# Patient Record
Sex: Male | Born: 1960 | Race: White | Hispanic: No | Marital: Married | Smoking: Never smoker
Health system: Southern US, Community
[De-identification: ages and names within clinical notes are randomized; demographics above are authoritative.]

## PROBLEM LIST (undated history)

## (undated) DIAGNOSIS — J948 Other specified pleural conditions: Secondary | ICD-10-CM

## (undated) DIAGNOSIS — E119 Type 2 diabetes mellitus without complications: Secondary | ICD-10-CM

## (undated) DIAGNOSIS — I480 Paroxysmal atrial fibrillation: Secondary | ICD-10-CM

## (undated) DIAGNOSIS — I5042 Chronic combined systolic (congestive) and diastolic (congestive) heart failure: Secondary | ICD-10-CM

## (undated) DIAGNOSIS — D638 Anemia in other chronic diseases classified elsewhere: Secondary | ICD-10-CM

## (undated) DIAGNOSIS — I4891 Unspecified atrial fibrillation: Secondary | ICD-10-CM

## (undated) DIAGNOSIS — A419 Sepsis, unspecified organism: Secondary | ICD-10-CM

## (undated) DIAGNOSIS — I10 Essential (primary) hypertension: Secondary | ICD-10-CM

## (undated) DIAGNOSIS — N183 Chronic kidney disease, stage 3 unspecified: Secondary | ICD-10-CM

## (undated) HISTORY — DX: Other specified pleural conditions: J94.8

## (undated) HISTORY — DX: Chronic combined systolic (congestive) and diastolic (congestive) heart failure: I50.42

## (undated) HISTORY — DX: Anemia in other chronic diseases classified elsewhere: D63.8

## (undated) HISTORY — DX: Sepsis, unspecified organism: A41.9

## (undated) HISTORY — DX: Paroxysmal atrial fibrillation: I48.0

## (undated) HISTORY — PX: HAND SURGERY: SHX662

## (undated) HISTORY — DX: Chronic kidney disease, stage 3 unspecified: N18.30

---
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
## 2003-09-22 ENCOUNTER — Other Ambulatory Visit: Payer: Self-pay

## 2019-06-10 ENCOUNTER — Other Ambulatory Visit: Payer: Self-pay

## 2019-06-10 ENCOUNTER — Inpatient Hospital Stay
Admission: EM | Admit: 2019-06-10 | Discharge: 2019-06-16 | DRG: 854 | Disposition: A | Discharge status: Home Health | Payer: Self-pay | Attending: Internal Medicine | Admitting: Internal Medicine

## 2019-06-10 ENCOUNTER — Encounter: Payer: Self-pay | Admitting: Emergency Medicine

## 2019-06-10 ENCOUNTER — Emergency Department: Payer: Self-pay

## 2019-06-10 DIAGNOSIS — N179 Acute kidney failure, unspecified: Secondary | ICD-10-CM | POA: Diagnosis present

## 2019-06-10 DIAGNOSIS — I4821 Permanent atrial fibrillation: Secondary | ICD-10-CM | POA: Diagnosis present

## 2019-06-10 DIAGNOSIS — N141 Nephropathy induced by other drugs, medicaments and biological substances: Secondary | ICD-10-CM | POA: Diagnosis present

## 2019-06-10 DIAGNOSIS — D62 Acute posthemorrhagic anemia: Secondary | ICD-10-CM | POA: Diagnosis not present

## 2019-06-10 DIAGNOSIS — E875 Hyperkalemia: Secondary | ICD-10-CM | POA: Diagnosis present

## 2019-06-10 DIAGNOSIS — L03113 Cellulitis of right upper limb: Secondary | ICD-10-CM | POA: Diagnosis present

## 2019-06-10 DIAGNOSIS — I130 Hypertensive heart and chronic kidney disease with heart failure and stage 1 through stage 4 chronic kidney disease, or unspecified chronic kidney disease: Secondary | ICD-10-CM | POA: Diagnosis present

## 2019-06-10 DIAGNOSIS — Z833 Family history of diabetes mellitus: Secondary | ICD-10-CM

## 2019-06-10 DIAGNOSIS — Z87891 Personal history of nicotine dependence: Secondary | ICD-10-CM

## 2019-06-10 DIAGNOSIS — A4102 Sepsis due to Methicillin resistant Staphylococcus aureus: Principal | ICD-10-CM | POA: Diagnosis present

## 2019-06-10 DIAGNOSIS — D631 Anemia in chronic kidney disease: Secondary | ICD-10-CM | POA: Diagnosis present

## 2019-06-10 DIAGNOSIS — R6520 Severe sepsis without septic shock: Secondary | ICD-10-CM | POA: Diagnosis present

## 2019-06-10 DIAGNOSIS — E1122 Type 2 diabetes mellitus with diabetic chronic kidney disease: Secondary | ICD-10-CM | POA: Diagnosis present

## 2019-06-10 DIAGNOSIS — T508X5A Adverse effect of diagnostic agents, initial encounter: Secondary | ICD-10-CM | POA: Diagnosis present

## 2019-06-10 DIAGNOSIS — I5022 Chronic systolic (congestive) heart failure: Secondary | ICD-10-CM | POA: Diagnosis present

## 2019-06-10 DIAGNOSIS — I4811 Longstanding persistent atrial fibrillation: Secondary | ICD-10-CM

## 2019-06-10 DIAGNOSIS — Z23 Encounter for immunization: Secondary | ICD-10-CM

## 2019-06-10 DIAGNOSIS — L02511 Cutaneous abscess of right hand: Secondary | ICD-10-CM | POA: Diagnosis present

## 2019-06-10 DIAGNOSIS — N183 Chronic kidney disease, stage 3 (moderate): Secondary | ICD-10-CM | POA: Diagnosis present

## 2019-06-10 DIAGNOSIS — I272 Pulmonary hypertension, unspecified: Secondary | ICD-10-CM | POA: Diagnosis present

## 2019-06-10 DIAGNOSIS — A419 Sepsis, unspecified organism: Secondary | ICD-10-CM | POA: Diagnosis present

## 2019-06-10 DIAGNOSIS — Z20828 Contact with and (suspected) exposure to other viral communicable diseases: Secondary | ICD-10-CM | POA: Diagnosis present

## 2019-06-10 HISTORY — DX: Type 2 diabetes mellitus without complications: E11.9

## 2019-06-10 LAB — BASIC METABOLIC PANEL
Anion gap: 9 (ref 5–15)
BUN: 60 mg/dL — ABNORMAL HIGH (ref 6–20)
CO2: 22 mmol/L (ref 22–32)
Calcium: 8 mg/dL — ABNORMAL LOW (ref 8.9–10.3)
Chloride: 98 mmol/L (ref 98–111)
Creatinine, Ser: 1.68 mg/dL — ABNORMAL HIGH (ref 0.61–1.24)
GFR calc Af Amer: 52 mL/min — ABNORMAL LOW (ref >60)
GFR calc non Af Amer: 45 mL/min — ABNORMAL LOW (ref >60)
Glucose, Bld: 256 mg/dL — ABNORMAL HIGH (ref 70–99)
Potassium: 5 mmol/L (ref 3.5–5.1)
Sodium: 129 mmol/L — ABNORMAL LOW (ref 135–145)

## 2019-06-10 LAB — CBC WITH DIFFERENTIAL/PLATELET
Abs Immature Granulocytes: 0.65 10*3/uL — ABNORMAL HIGH (ref 0.00–0.07)
Basophils Absolute: 0.1 10*3/uL (ref 0.0–0.1)
Basophils Relative: 0 %
Eosinophils Absolute: 0.3 10*3/uL (ref 0.0–0.5)
Eosinophils Relative: 1 %
HCT: 30.2 % — ABNORMAL LOW (ref 39.0–52.0)
Hemoglobin: 10.1 g/dL — ABNORMAL LOW (ref 13.0–17.0)
Immature Granulocytes: 2 %
Lymphocytes Relative: 2 %
Lymphs Abs: 0.5 10*3/uL — ABNORMAL LOW (ref 0.7–4.0)
MCH: 29.4 pg (ref 26.0–34.0)
MCHC: 33.4 g/dL (ref 30.0–36.0)
MCV: 88 fL (ref 80.0–100.0)
Monocytes Absolute: 2.6 10*3/uL — ABNORMAL HIGH (ref 0.1–1.0)
Monocytes Relative: 9 %
Neutro Abs: 25.1 10*3/uL — ABNORMAL HIGH (ref 1.7–7.7)
Neutrophils Relative %: 86 %
Platelets: 759 10*3/uL — ABNORMAL HIGH (ref 150–400)
RBC: 3.43 MIL/uL — ABNORMAL LOW (ref 4.22–5.81)
RDW: 11.8 % (ref 11.5–15.5)
Smear Review: ADEQUATE
WBC: 29.4 10*3/uL — ABNORMAL HIGH (ref 4.0–10.5)
nRBC: 0 % (ref 0.0–0.2)

## 2019-06-10 LAB — GLUCOSE, CAPILLARY: Glucose-Capillary: 156 mg/dL — ABNORMAL HIGH (ref 70–99)

## 2019-06-10 LAB — LACTIC ACID, PLASMA
Lactic Acid, Venous: 1 mmol/L (ref 0.5–1.9)
Lactic Acid, Venous: 1.7 mmol/L (ref 0.5–1.9)
Lactic Acid, Venous: 0.9 mmol/L (ref 0.5–1.9)

## 2019-06-10 MED ORDER — VANCOMYCIN HCL IN DEXTROSE 1-5 GM/200ML-% IV SOLN
1000.0000 mg | INTRAVENOUS | Status: DC
  Filled 2019-06-10: qty 200

## 2019-06-10 MED ORDER — SODIUM CHLORIDE 0.9 % IV SOLN
INTRAVENOUS | Status: DC
  Administered 2019-06-10 – 2019-06-11 (×4): via INTRAVENOUS

## 2019-06-10 MED ORDER — SODIUM CHLORIDE 0.9 % IV BOLUS
1000.0000 mL | Freq: Once | INTRAVENOUS | Status: AC
  Administered 2019-06-10: 18:00:00 1000 mL via INTRAVENOUS

## 2019-06-10 MED ORDER — INSULIN ASPART 100 UNIT/ML ~~LOC~~ SOLN
0.0000 [IU] | Freq: Three times a day (TID) | SUBCUTANEOUS | Status: DC
  Administered 2019-06-11 (×2): 1 [IU] via SUBCUTANEOUS
  Administered 2019-06-12: 2 [IU] via SUBCUTANEOUS
  Administered 2019-06-12: 1 [IU] via SUBCUTANEOUS
  Administered 2019-06-13 (×2): 2 [IU] via SUBCUTANEOUS
  Administered 2019-06-13 – 2019-06-14 (×2): 3 [IU] via SUBCUTANEOUS
  Administered 2019-06-14 – 2019-06-16 (×6): 2 [IU] via SUBCUTANEOUS
  Filled 2019-06-10 (×13): qty 1

## 2019-06-10 MED ORDER — INSULIN ASPART 100 UNIT/ML ~~LOC~~ SOLN
0.0000 [IU] | Freq: Every day | SUBCUTANEOUS | Status: DC
  Administered 2019-06-13: 22:00:00 2 [IU] via SUBCUTANEOUS
  Filled 2019-06-10: qty 1

## 2019-06-10 MED ORDER — VANCOMYCIN HCL IN DEXTROSE 1-5 GM/200ML-% IV SOLN
1000.0000 mg | Freq: Once | INTRAVENOUS | Status: DC
  Filled 2019-06-10: qty 200

## 2019-06-10 MED ORDER — SODIUM CHLORIDE 0.9 % IV SOLN
2.0000 g | Freq: Once | INTRAVENOUS | Status: AC
  Administered 2019-06-10: 17:00:00 2 g via INTRAVENOUS
  Filled 2019-06-10: qty 20

## 2019-06-10 MED ORDER — VANCOMYCIN HCL 1.5 G IV SOLR
1500.0000 mg | Freq: Once | INTRAVENOUS | Status: AC
  Administered 2019-06-10: 1500 mg via INTRAVENOUS
  Filled 2019-06-10: qty 1500

## 2019-06-10 MED ORDER — SODIUM CHLORIDE 0.9 % IV SOLN
2.0000 g | Freq: Two times a day (BID) | INTRAVENOUS | Status: DC
  Administered 2019-06-10 – 2019-06-12 (×4): 2 g via INTRAVENOUS
  Filled 2019-06-10 (×5): qty 2

## 2019-06-10 MED ORDER — INFLUENZA VAC SPLIT QUAD 0.5 ML IM SUSY
0.5000 mL | PREFILLED_SYRINGE | INTRAMUSCULAR | Status: AC
  Administered 2019-06-14: 10:00:00 0.5 mL via INTRAMUSCULAR
  Filled 2019-06-10: qty 0.5

## 2019-06-10 NOTE — Consult Note (Signed)
ORTHOPAEDIC CONSULTATION  PATIENT NAME: Nathan [name] DOB: [birth year]  MRN: [id number]  REQUESTING PHYSICIAN: Saundra Shelling, MD  Chief Complaint: Right thumb swelling pain and purulent drainage  HPI: Nathan [name] is a 58 y.o. male who complains of right thumb swelling and pain as well as purulent drainage that started 8 days ago.  Patient has history of diabetes mellitus that he was diagnosed with few months ago.  His last A1c was 6.5.  Patient however has been off his medication and trying to control his blood sugar with diet.  He denies any insect bite or other poking injury.  Patient works fixing heating and air-conditioning and is not sure if he got injured at work.  He was admitted through the ER at [facility] with tachycardia and fever and WBC of 29,000.  The orthopedic service has been consulted.  History reviewed. No pertinent past medical history. History reviewed. No pertinent surgical history. Social History   Socioeconomic History  . Marital status: Married    Spouse name: Not on file  . Number of children: Not on file  . Years of education: Not on file  . Highest education level: Not on file  Occupational History  . Not on file  Social Needs  . Financial resource strain: Not on file  . Food insecurity    Worry: Not on file    Inability: Not on file  . Transportation needs    Medical: Not on file    Non-medical: Not on file  Tobacco Use  . Smoking status: Never Smoker  . Smokeless tobacco: Never Used  Substance and Sexual Activity  . Alcohol use: Never    Frequency: Never  . Drug use: Never  . Sexual activity: Not on file  Lifestyle  . Physical activity    Days per week: Not on file    Minutes per session: Not on file  . Stress: Not on file  Relationships  . Social Herbalist on phone: Not on file    Gets together: Not on file    Attends religious service: Not on file    Active member of club or organization: Not  on file    Attends meetings of clubs or organizations: Not on file    Relationship status: Not on file  Other Topics Concern  . Not on file  Social History Narrative  . Not on file   History reviewed. No pertinent family history. No Known Allergies Prior to Admission medications   Medication Sig Start Date End Date Taking? Authorizing Provider  amoxicillin-clavulanate (AUGMENTIN) 875-125 MG tablet Take 1 tablet by mouth 2 (two) times daily with a meal. 06/05/19  Yes [provider]  hydrochlorothiazide (HYDRODIURIL) 12.5 MG tablet Take 12.5 mg by mouth daily. 02/27/19  Yes [provider]  losartan (COZAAR) 25 MG tablet Take 25 mg by mouth daily. 02/27/19  Yes [provider]   Dg Hand Complete Right  Result Date: 06/10/2019 CLINICAL DATA:  Infection in right thumb EXAM: RIGHT HAND - COMPLETE 3+ VIEW COMPARISON:  None. FINDINGS: No fracture or dislocation of the right hand. Joint spaces are well preserved. Soft tissue edema about the right thumb. IMPRESSION: Soft tissue edema about the right thumb. No fracture, dislocation, or other osseous abnormality. Electronically Signed   By: Eddie Candle M.D.   On: 06/10/2019 17:14    Positive ROS: All other systems have been reviewed and were otherwise negative with the exception of those mentioned  in the HPI and as above.  Physical Exam: General: Well developed, well nourished male seen in no acute distress. HEENT: Atraumatic and normocephalic. Sclera are clear. Extraocular motion is intact. Oropharynx is clear with moist mucosa. Neck: Supple, nontender, good range of motion. No JVD or carotid bruits. Lungs: Clear to auscultation bilaterally. Cardiovascular: Regular rate and rhythm with normal S1 and S2. No murmurs. No gallops or rubs. Pedal pulses are palpable bilaterally. Homans test is negative bilaterally. No significant pretibial or ankle edema. Abdomen: Soft, nontender, and nondistended. Bowel sounds are  present. Skin: No lesions in the area of chief complaint Neurologic: Awake, alert, and oriented. Sensory function is grossly intact. Motor strength is felt to be 5 over 5 bilaterally. No clonus or tremor. Good motor coordination. Lymphatic: No axillary or cervical lymphadenopathy  MUSCULOSKELETAL: Right hand shows swelling around right thumb metacarpophalangeal joint extending down to the [facility] joint.  There is a purulent draining sinus around the MCP joint.  There is also redness and swelling extending into the first dorsal webspace.  Brisk capillary refill is present.  He has intact function of radial median and ulnar nerve in sensorimotor distribution in his right hand.  Assessment: Right hand soft tissue abscess  Plan: 58 years old male recently diagnosed diabetic with a right hand swelling with redness and purulent drainage.  I had a detailed discussion with the patient.  Patient will be set up with CT scan of his right hand to see the extent of the abscess.  Patient will be scheduled for possible incision and drainage tomorrow by the on-call team.   Creig Hines, MD

## 2019-06-10 NOTE — Consult Note (Signed)
Pharmacy Antibiotic Note  Nathan [name] is a 58 y.o. male admitted on 06/10/2019 with sepsis.  Pharmacy has been consulted for Cefepime/Vancomycin  dosing.  Patient received Vancomycin 1750 mg and Ceftriaxone in the ED.  Scr 1.68   Plan: 1. Cefepime 2 g Q12H  2. Will order Vancomycin 1000 mg Q24H- next dose due tomorrow @ 1800.  Will check renal function with AM labs.  AUC Goal 400-550 Expected AUC 452.4 Cssmin 10.9  Height: 5\' 9"  (175.3 cm) Weight: 150 lb (68 kg) IBW/kg (Calculated) : 70.7  Temp (24hrs), Avg:98 F (36.7 C), Min:98 F (36.7 C), Max:98 F (36.7 C)  Recent Labs  Lab 06/10/19 1451 06/10/19 1632  WBC 29.4*  --   CREATININE 1.68*  --   LATICACIDVEN  --  1.0    Estimated Creatinine Clearance: 47.2 mL/min (A) (by C-G formula based on SCr of 1.68 mg/dL (H)).    No Known Allergies   Thank you for allowing pharmacy to be a part of this patient's care.  Rowland Lathe 06/10/2019 5:44 PM

## 2019-06-10 NOTE — Progress Notes (Signed)
CODE SEPSIS - PHARMACY COMMUNICATION  **Broad Spectrum Antibiotics should be administered within 1 hour of Sepsis diagnosis**  Time Code Sepsis Called/Page Received: 5:01 pm   Antibiotics Ordered: Ceftriaxone and Vancomycin   Time of 1st antibiotic administration: Ceftriaxone @ 1715  Additional action taken by pharmacy: N/A  If necessary, Name of Provider/Nurse Contacted: N/A    Rowland Lathe ,PharmD Clinical Pharmacist  06/10/2019  5:15 PM

## 2019-06-10 NOTE — ED Provider Notes (Addendum)
[facility] Emergency Department Provider Note  Time seen: 4:53 PM  I have reviewed the triage vital signs and the nursing notes.   HISTORY  Chief Complaint Wound Infection   HPI Nathan [name] is a 58 y.o. male with no past medical history who presents to the emergency department for right hand/thumb pain redness and swelling.  According to the patient for the past 8 or 9 days he has had redness pain and swelling mostly to the back of the right thumb.  Patient went to urgent care 6 days ago and was started on Augmentin.  States 2 days ago began draining pus from the back left thumb he returned to urgent care today and they referred him to the emergency department.  Patient denies any known fever at home.  Denies any shortness of breath.  Patient is diabetic and states his blood sugars have been running high although he states he does not know what numbers that have been.   History reviewed. No pertinent past medical history.  There are no active problems to display for this patient.   History reviewed. No pertinent surgical history.  Prior to Admission medications   Not on File    No Known Allergies  History reviewed. No pertinent family history.  Social History Social History   Tobacco Use  . Smoking status: Never Smoker  . Smokeless tobacco: Never Used  Substance Use Topics  . Alcohol use: Never    Frequency: Never  . Drug use: Never    Review of Systems Constitutional: Negative for fever. Cardiovascular: Negative for chest pain. Respiratory: Negative for shortness of breath. Gastrointestinal: Negative for abdominal pain Musculoskeletal: Right hand swelling, redness and pain Skin: Redness.  Swelling of the right thumb. Neurological: Negative for headache All other ROS negative  ____________________________________________   PHYSICAL EXAM:  VITAL SIGNS: ED Triage Vitals  Enc Vitals Group     BP 06/10/19 1440 (!) 153/82   Pulse Rate 06/10/19 1440 96     Resp 06/10/19 1440 16     Temp 06/10/19 1440 98 F (36.7 C)     Temp src --      SpO2 06/10/19 1440 96 %     Weight 06/10/19 1442 150 lb (68 kg)     Height 06/10/19 1442 5\' 9"  (1.753 m)     Head Circumference --      Peak Flow --      Pain Score 06/10/19 1441 3     Pain Loc --      Pain Edu? --      Excl. in [place]? --     Constitutional: Alert and oriented. Well appearing and in no distress. Eyes: Normal exam ENT      Head: Normocephalic and atraumatic.      Mouth/Throat: Mucous membranes are moist. Cardiovascular: Normal rate, regular rhythm. N Respiratory: Normal respiratory effort without tachypnea nor retractions. Breath sounds are clear Gastrointestinal: Soft and nontender. No distention.  Musculoskeletal: Nontender with normal range of motion in all extremities.  Neurologic:  Normal speech and language. No gross focal neurologic deficits are appreciated. Skin:  Skin is warm, dry and intact.  Psychiatric: Mood and affect are normal. Speech and behavior are normal.      RADIOLOGY  X-ray shows soft tissue edema around the right thumb no other abnormality noted.  ____________________________________________   INITIAL IMPRESSION / ASSESSMENT AND PLAN / ED COURSE  Pertinent labs & imaging results that were available during my care of  the patient were reviewed by me and considered in my medical decision making (see chart for details).   Patient presents emergency department for approximately 10 days of right thumb redness swelling and discomfort.  Last 6 days patient has been on antibiotics with continued worsening of apparent infection.  For the last 2 days there is been pus draining out of the dorsal aspect of the right thumb/hand.  Patient appears to have significant infection, we will obtain an x-ray to rule out osteomyelitis or gas-forming organisms.  We will check labs and closely monitor.  Patient's white blood cell count has resulted  extremely elevated at 29,000, given tachycardia and extremely high white blood cell count he meets sepsis criteria.  We will check blood cultures, start the patient on IV vancomycin and ceftriaxone.  Patient will be admitted to the hospitalist service for further work-up and treatment and possible orthopedic consultation.  Nathan [name] was evaluated in Emergency Department on 06/10/2019 for the symptoms described in the history of present illness. He was evaluated in the context of the global COVID-19 pandemic, which necessitated consideration that the patient might be at risk for infection with the SARS-CoV-2 virus that causes COVID-19. Institutional protocols and algorithms that pertain to the evaluation of patients at risk for COVID-19 are in a state of rapid change based on information released by regulatory bodies including the CDC and federal and state organizations. These policies and algorithms were followed during the patient's care in the ED.  ____________________________________________   FINAL CLINICAL IMPRESSION(S) / ED DIAGNOSES  Cellulitis   Harvest Dark, MD 06/10/19 1719    Harvest Dark, MD 06/10/19 1719

## 2019-06-10 NOTE — ED Triage Notes (Signed)
Infection in right thumb since last Saturday.  Failed oral antibiotic therapy. Sent in by urgent care

## 2019-06-10 NOTE — H&P (Signed)
[place] [facility] [place] NAME: Nathan [name]    MR#:  [id number]  DATE OF BIRTH:  Feb 04, 1961  DATE OF ADMISSION:  06/10/2019  PRIMARY CARE PHYSICIAN: Patient, No Pcp Per   REQUESTING/REFERRING PHYSICIAN: Harvest Dark  CHIEF COMPLAINT:   Chief Complaint  Patient presents with  . Wound Infection    HISTORY OF PRESENT ILLNESS:  Nathan [name]  is a 58 y.o. male with a known history of diabetes mellitus and hypertension who presented to the emergency room with infected right stump.  This initially started about 8 days ago as a blister with redness and swelling involving the right stump.  Patient was seen at the urgent care about 6 days ago and started on Augmentin.  Within the last 2 days patient started having more swelling and redness and purulent drainage.  Patient denies any trauma.  Does not recall any insect bite.  Patient was sent to the emergency room from the urgent care for further evaluation.  Patient found to have significant leukocytosis with white count of 29,000 and was tachycardic on arrival.  X-ray of the right thumb done revealed soft tissue edema about the right thumb. No fracture, dislocation, or other osseous abnormality.  Patient diagnosed with sepsis secondary to right stump infection.  Medical service called to admit patient for further evaluation and management.   PAST MEDICAL HISTORY:  Hypertension Diabetes mellitus  PAST SURGICAL HISTORY:  History reviewed. No pertinent surgical history.  SOCIAL HISTORY:   Social History   Tobacco Use  . Smoking status: Never Smoker  . Smokeless tobacco: Never Used  Substance Use Topics  . Alcohol use: Never    Frequency: Never    FAMILY HISTORY:  Family history positive for diabetes mellitus but no coronary artery disease  DRUG ALLERGIES:  No Known Allergies  REVIEW OF SYSTEMS:   Review of Systems  Constitutional: Positive for fever. Negative for chills.   HENT: Negative for hearing loss and tinnitus.   Eyes: Negative for blurred vision.  Respiratory: Negative for cough and shortness of breath.   Cardiovascular: Negative for chest pain and palpitations.  Gastrointestinal: Negative for heartburn, nausea and vomiting.  Genitourinary: Negative for dysuria and frequency.  Musculoskeletal: Positive for myalgias.       Right thumb swelling and redness with purulent discharge  Skin: Negative for itching and rash.  Neurological: Negative for dizziness and headaches.  Psychiatric/Behavioral: Negative for depression and hallucinations.    MEDICATIONS AT HOME:   Prior to Admission medications   Medication Sig Start Date End Date Taking? Authorizing Provider  amoxicillin-clavulanate (AUGMENTIN) 875-125 MG tablet Take 1 tablet by mouth 2 (two) times daily with a meal. 06/05/19  Yes [provider]  hydrochlorothiazide (HYDRODIURIL) 12.5 MG tablet Take 12.5 mg by mouth daily. 02/27/19  Yes [provider]  losartan (COZAAR) 25 MG tablet Take 25 mg by mouth daily. 02/27/19  Yes [provider]      VITAL SIGNS:  Blood pressure (!) 153/82, pulse 96, temperature 98 F (36.7 C), resp. rate 16, height 5\' 9"  (1.753 m), weight 68 kg, SpO2 96 %.  PHYSICAL EXAMINATION:  Physical Exam  GENERAL:  58 y.o.-year-old patient lying in the bed with no acute distress.  EYES: Pupils equal, round, reactive to light and accommodation. No scleral icterus. Extraocular muscles intact.  HEENT: Head atraumatic, normocephalic. Oropharynx and nasopharynx clear.  NECK:  Supple, no jugular venous distention. No thyroid enlargement, no tenderness.  LUNGS: Normal  breath sounds bilaterally, no wheezing, rales,rhonchi or crepitation. No use of accessory muscles of respiration.  CARDIOVASCULAR: S1, S2 normal. No murmurs, rubs, or gallops.  ABDOMEN: Soft, nontender, nondistended. Bowel sounds present. No organomegaly or mass.  EXTREMITIES: Right thumb  swelling and redness with an area of slight open wound with purulent discharge on the dorsal aspect see picture below..  No edema.Marland Kitchen  NEUROLOGIC: Cranial nerves II through XII are intact. Muscle strength 5/5 in all extremities. Sensation intact. Gait not checked.  PSYCHIATRIC: The patient is alert and oriented x 3.  SKIN: Redness and swelling with purulent discharge from the right temple.         LABORATORY PANEL:   CBC Recent Labs  Lab 06/10/19 1451  WBC 29.4*  HGB 10.1*  HCT 30.2*  PLT 759*   ------------------------------------------------------------------------------------------------------------------  Chemistries  Recent Labs  Lab 06/10/19 1451  NA 129*  K 5.0  CL 98  CO2 22  GLUCOSE 256*  BUN 60*  CREATININE 1.68*  CALCIUM 8.0*   ------------------------------------------------------------------------------------------------------------------  Cardiac Enzymes No results for input(s): TROPONINI in the last 168 hours. ------------------------------------------------------------------------------------------------------------------  RADIOLOGY:  Dg Hand Complete Right  Result Date: 06/10/2019 CLINICAL DATA:  Infection in right thumb EXAM: RIGHT HAND - COMPLETE 3+ VIEW COMPARISON:  None. FINDINGS: No fracture or dislocation of the right hand. Joint spaces are well preserved. Soft tissue edema about the right thumb. IMPRESSION: Soft tissue edema about the right thumb. No fracture, dislocation, or other osseous abnormality. Electronically Signed   By: Eddie Candle M.D.   On: 06/10/2019 17:14      IMPRESSION AND PLAN:   Patient is a 58 year old male with history of hypertension and diabetes mellitus admitted for management of sepsis secondary to right thumb infection.  1.  Sepsis secondary to right thumb infection Patient started on broad-spectrum IV antibiotics with IV vancomycin and cefepime with pharmacy to dose. IV fluid hydration. Patient having purulent  discharge from the right thumb.  Requested for orthopedic consult for evaluation for possible I&D. X-rays done with no evidence of fracture or dislocation or osseous abnormality. Kept n.p.o. for now while awaiting evaluation from surgeon to determine if any plans for any surgical intervention today.  2.  Hypertension Resume home meds after medication reconciliation is done.  3.  Diabetes mellitus type 2 Placed on sliding scale insulin coverage.  Glycosylated hemoglobin level in a.m.  DVT prophylaxis; SCDs for now Will initiate Lovenox if no plans for any surgical intervention.   All the records are reviewed and case discussed with ED provider. Management plans discussed with the patient, family and they are in agreement.  CODE STATUS: Full code  TOTAL TIME TAKING CARE OF THIS PATIENT: 59 minutes.    Alixandrea Milleson M.D on 06/10/2019 at 6:10 PM  Between 7am to 6pm - Pager - [phone]  After 6pm go to [email or web address] - Proofreader  [facility]  Office  [phone]  CC: Primary care physician; Patient, No Pcp Per   Note: This dictation was prepared with Dragon dictation along with smaller phrase technology. Any transcriptional errors that result from this process are unintentional.

## 2019-06-10 NOTE — Progress Notes (Deleted)
CODE SEPSIS - PHARMACY COMMUNICATION  **Broad Spectrum Antibiotics should be administered within 1 hour of Sepsis diagnosis**  Time Code Sepsis Called/Page Received: 1700  Antibiotics Ordered: Rocephin   Time of 1st antibiotic administration: [id number]  Additional action taken by pharmacy: N/A  If necessary, Name of Provider/Nurse Contacted: N/A    Eston Mould ,PharmD Clinical Pharmacist  06/10/2019  5:38 PM

## 2019-06-10 NOTE — ED Notes (Signed)
ED TO INPATIENT HANDOFF REPORT  ED Nurse Name and Phone #: [phone]  S Name/Age/Gender Nathan [name] 59 y.o. male Room/Bed: ED19A/ED19A  Code Status   Code Status: Full Code  Home/SNF/Other Home Patient oriented to: self, place, time and situation Is this baseline? Yes   Triage Complete: Triage complete  Chief Complaint Wound Infection  Triage Note Infection in right thumb since last Saturday.  Failed oral antibiotic therapy. Sent in by urgent care   Allergies No Known Allergies  Level of Care/Admitting Diagnosis ED Disposition    ED Disposition Condition [place]: [place] [[id number]]  Level of Care: Med-Surg [16]  Covid Evaluation: Asymptomatic Screening Protocol (No Symptoms)  [facility]: [id number]  Admitting Physician: Otila Back [place]  Attending Physician: Otila Back [[id number]]  Estimated length of stay: past midnight tomorrow  Certification:: I certify this patient will need inpatient services for at least 2 midnights  PT Class (Do Not Modify): Inpatient [101]  PT Acc Code (Do Not Modify): Private [1]       B Medical/Surgery History History reviewed. No pertinent past medical history. History reviewed. No pertinent surgical history.   A IV Location/Drains/Wounds Patient Lines/Drains/Airways Status   Active Line/Drains/Airways    Name:   Placement date:   Placement time:   Site:   Days:   Peripheral IV 06/10/19 Left Antecubital   06/10/19    1638    Antecubital   less than 1          Intake/Output Last 24 hours No intake or output data in the 24 hours ending 06/10/19 1858  Labs/Imaging Results for orders placed or performed during the hospital encounter of 06/10/19 (from the past 48 hour(s))  CBC with Differential     Status: Abnormal   Collection Time: 06/10/19  2:51 PM  Result Value Ref Range   WBC 29.4 (H) 4.0 - 10.5 K/uL   RBC 3.43 (L) 4.22 - 5.81 MIL/uL   Hemoglobin 10.1 (L)  13.0 - 17.0 g/dL   HCT 30.2 (L) 39.0 - 52.0 %   MCV 88.0 80.0 - 100.0 fL   MCH 29.4 26.0 - 34.0 pg   MCHC 33.4 30.0 - 36.0 g/dL   RDW 11.8 11.5 - 15.5 %   Platelets 759 (H) 150 - 400 K/uL   nRBC 0.0 0.0 - 0.2 %   Neutrophils Relative % 86 %   Neutro Abs 25.1 (H) 1.7 - 7.7 K/uL   Lymphocytes Relative 2 %   Lymphs Abs 0.5 (L) 0.7 - 4.0 K/uL   Monocytes Relative 9 %   Monocytes Absolute 2.6 (H) 0.1 - 1.0 K/uL   Eosinophils Relative 1 %   Eosinophils Absolute 0.3 0.0 - 0.5 K/uL   Basophils Relative 0 %   Basophils Absolute 0.1 0.0 - 0.1 K/uL   WBC Morphology MORPHOLOGY UNREMARKABLE    RBC Morphology MORPHOLOGY UNREMARKABLE    Smear Review PLATELETS APPEAR ADEQUATE    Immature Granulocytes 2 %   Abs Immature Granulocytes 0.65 (H) 0.00 - 0.07 K/uL    Comment: Performed at [facility], [address] [place], [place] [id number]  Basic metabolic panel     Status: Abnormal   Collection Time: 06/10/19  2:51 PM  Result Value Ref Range   Sodium 129 (L) 135 - 145 mmol/L   Potassium 5.0 3.5 - 5.1 mmol/L   Chloride 98 98 - 111 mmol/L   CO2 22 22 - 32 mmol/L  Glucose, Bld 256 (H) 70 - 99 mg/dL   BUN 60 (H) 6 - 20 mg/dL   Creatinine, Ser 1.68 (H) 0.61 - 1.24 mg/dL   Calcium 8.0 (L) 8.9 - 10.3 mg/dL   GFR calc non Af Amer 45 (L) >60 mL/min   GFR calc Af Amer 52 (L) >60 mL/min   Anion gap 9 5 - 15    Comment: Performed at [facility], [address] [place], [place] [address]  Lactic acid, plasma     Status: None   Collection Time: 06/10/19  4:32 PM  Result Value Ref Range   Lactic Acid, Venous 1.0 0.5 - 1.9 mmol/L    Comment: Performed at [facility], [address] [place], [place] [address]   Dg Hand Complete Right  Result Date: 06/10/2019 CLINICAL DATA:  Infection in right thumb EXAM: RIGHT HAND - COMPLETE 3+ VIEW COMPARISON:  None. FINDINGS: No fracture or dislocation of the right hand. Joint spaces are well preserved. Soft tissue edema about the  right thumb. IMPRESSION: Soft tissue edema about the right thumb. No fracture, dislocation, or other osseous abnormality. Electronically Signed   By: Eddie Candle M.D.   On: 06/10/2019 17:14    Pending Labs Unresulted Labs (From admission, onward)    Start     Ordered   06/11/19 0500  Protime-INR  Tomorrow morning,   STAT     06/10/19 1736   06/11/19 0500  Cortisol-am, blood  Tomorrow morning,   STAT     06/10/19 1736   06/11/19 0500  Procalcitonin  Tomorrow morning,   STAT     06/10/19 1736   06/11/19 [id number]  Basic metabolic panel  Tomorrow morning,   STAT     06/10/19 1737   06/11/19 0500  CBC  Tomorrow morning,   STAT     06/10/19 1737   06/11/19 0500  Magnesium  Tomorrow morning,   STAT     06/10/19 1737   06/11/19 0500  Phosphorus  Tomorrow morning,   STAT     06/10/19 1737   06/11/19 0500  Hemoglobin A1c  Tomorrow morning,   STAT     06/10/19 1737   06/10/19 1734  HIV antibody (Routine Testing)  Once,   STAT     06/10/19 1736   06/10/19 1700  SARS CORONAVIRUS 2 (TAT 6-24 HRS) Nasopharyngeal Nasopharyngeal Swab  (Asymptomatic/Tier 2 Patients Labs)  Once,   STAT    Question Answer Comment  Is this test for diagnosis or screening Screening   Symptomatic for COVID-19 as defined by CDC No   Hospitalized for COVID-19 No   Admitted to ICU for COVID-19 No   Previously tested for COVID-19 No   Resident in a congregate (group) care setting No   Employed in healthcare setting No      06/10/19 1700   06/10/19 1640  Blood culture (routine x 2)  BLOOD CULTURE X 2,   STAT     06/10/19 1639   06/10/19 1640  Lactic acid, plasma  Now then every 2 hours,   STAT     06/10/19 1639          Vitals/Pain Today's Vitals   06/10/19 1441 06/10/19 1442 06/10/19 1755 06/10/19 1801  BP:   (!) 153/102 (!) 156/77  Pulse:   92 91  Resp:      Temp:      SpO2:   96% 97%  Weight:  68 kg    Height:  5\' 9"  (1.753 m)    PainSc: 3        Isolation Precautions No active  isolations  Medications Medications  0.9 %  sodium chloride infusion (has no administration in time range)  insulin aspart (novoLOG) injection 0-9 Units (has no administration in time range)  insulin aspart (novoLOG) injection 0-5 Units (has no administration in time range)  vancomycin (VANCOCIN) 1,500 mg in sodium chloride 0.9 % 500 mL IVPB (1,500 mg Intravenous New Bag/Given 06/10/19 1804)  vancomycin (VANCOCIN) IVPB 1000 mg/200 mL premix (has no administration in time range)  ceFEPIme (MAXIPIME) 2 g in sodium chloride 0.9 % 100 mL IVPB (has no administration in time range)  cefTRIAXone (ROCEPHIN) 2 g in sodium chloride 0.9 % 100 mL IVPB (0 g Intravenous Stopped 06/10/19 1757)  sodium chloride 0.9 % bolus 1,000 mL (1,000 mLs Intravenous New Bag/Given 06/10/19 1801)    Mobility walks Low fall risk   Focused Assessments integumentary   R Recommendations: See Admitting Provider Note  Report given to:   Additional Notes:

## 2019-06-11 ENCOUNTER — Encounter
Admission: EM | Disposition: A | Discharge status: Home Health | Payer: Self-pay | Source: Home / Self Care | Attending: Internal Medicine

## 2019-06-11 ENCOUNTER — Inpatient Hospital Stay: Payer: Self-pay | Admitting: Anesthesiology

## 2019-06-11 ENCOUNTER — Inpatient Hospital Stay: Payer: Self-pay

## 2019-06-11 HISTORY — PX: I & D EXTREMITY: SHX5045

## 2019-06-11 LAB — BLOOD CULTURE ID PANEL (REFLEXED)

## 2019-06-11 LAB — BASIC METABOLIC PANEL WITH GFR
Anion gap: 7 (ref 5–15)
BUN: 51 mg/dL — ABNORMAL HIGH (ref 6–20)
CO2: 19 mmol/L — ABNORMAL LOW (ref 22–32)
Calcium: 7.5 mg/dL — ABNORMAL LOW (ref 8.9–10.3)
Chloride: 108 mmol/L (ref 98–111)
Creatinine, Ser: 1.46 mg/dL — ABNORMAL HIGH (ref 0.61–1.24)
GFR calc Af Amer: >60 mL/min
GFR calc non Af Amer: 53 mL/min — ABNORMAL LOW
Glucose, Bld: 131 mg/dL — ABNORMAL HIGH (ref 70–99)
Potassium: 5.2 mmol/L — ABNORMAL HIGH (ref 3.5–5.1)
Sodium: 134 mmol/L — ABNORMAL LOW (ref 135–145)

## 2019-06-11 LAB — CBC
HCT: 27.2 % — ABNORMAL LOW (ref 39.0–52.0)
Hemoglobin: 9 g/dL — ABNORMAL LOW (ref 13.0–17.0)
MCH: 29.2 pg (ref 26.0–34.0)
MCHC: 33.1 g/dL (ref 30.0–36.0)
MCV: 88.3 fL (ref 80.0–100.0)
Platelets: 686 10*3/uL — ABNORMAL HIGH (ref 150–400)
RBC: 3.08 MIL/uL — ABNORMAL LOW (ref 4.22–5.81)
RDW: 11.9 % (ref 11.5–15.5)
WBC: 23.3 10*3/uL — ABNORMAL HIGH (ref 4.0–10.5)
nRBC: 0 % (ref 0.0–0.2)

## 2019-06-11 LAB — HEMOGLOBIN A1C
Hgb A1c MFr Bld: 6.8 % — ABNORMAL HIGH (ref 4.8–5.6)
Mean Plasma Glucose: 148.46 mg/dL

## 2019-06-11 LAB — GLUCOSE, CAPILLARY
Glucose-Capillary: 114 mg/dL — ABNORMAL HIGH (ref 70–99)
Glucose-Capillary: 124 mg/dL — ABNORMAL HIGH (ref 70–99)
Glucose-Capillary: 125 mg/dL — ABNORMAL HIGH (ref 70–99)
Glucose-Capillary: 127 mg/dL — ABNORMAL HIGH (ref 70–99)
Glucose-Capillary: 130 mg/dL — ABNORMAL HIGH (ref 70–99)

## 2019-06-11 LAB — SURGICAL PCR SCREEN
MRSA, PCR: POSITIVE — AB
Staphylococcus aureus: POSITIVE — AB

## 2019-06-11 LAB — PROTIME-INR
INR: 1.3 — ABNORMAL HIGH (ref 0.8–1.2)
Prothrombin Time: 16.2 s — ABNORMAL HIGH (ref 11.4–15.2)

## 2019-06-11 LAB — CORTISOL-AM, BLOOD: Cortisol - AM: 24.3 ug/dL — ABNORMAL HIGH (ref 6.7–22.6)

## 2019-06-11 LAB — PROCALCITONIN: Procalcitonin: 0.28 ng/mL

## 2019-06-11 LAB — SARS CORONAVIRUS 2 (TAT 6-24 HRS): SARS Coronavirus 2: NEGATIVE

## 2019-06-11 LAB — PHOSPHORUS: Phosphorus: 3.9 mg/dL (ref 2.5–4.6)

## 2019-06-11 LAB — MAGNESIUM: Magnesium: 2.5 mg/dL — ABNORMAL HIGH (ref 1.7–2.4)

## 2019-06-11 SURGERY — IRRIGATION AND DEBRIDEMENT EXTREMITY
Anesthesia: General | Laterality: Right

## 2019-06-11 MED ORDER — FENTANYL CITRATE (PF) 100 MCG/2ML IJ SOLN
INTRAMUSCULAR | Status: AC
  Filled 2019-06-11: qty 2

## 2019-06-11 MED ORDER — NEOMYCIN-POLYMYXIN B GU 40-200000 IR SOLN
Status: DC | PRN
  Administered 2019-06-11: 2 mL

## 2019-06-11 MED ORDER — SODIUM CHLORIDE (PF) 0.9 % IJ SOLN
INTRAMUSCULAR | Status: AC
  Filled 2019-06-11: qty 20

## 2019-06-11 MED ORDER — MIDAZOLAM HCL 2 MG/2ML IJ SOLN
INTRAMUSCULAR | Status: DC | PRN
  Administered 2019-06-11: 2 mg via INTRAVENOUS

## 2019-06-11 MED ORDER — PROPOFOL 10 MG/ML IV BOLUS
INTRAVENOUS | Status: AC
  Filled 2019-06-11: qty 20

## 2019-06-11 MED ORDER — OXYCODONE HCL 5 MG/5ML PO SOLN: 5.0000 mg | Freq: Once | ORAL | Status: DC | PRN

## 2019-06-11 MED ORDER — CHLORHEXIDINE GLUCONATE CLOTH 2 % EX PADS: 6.0000 | MEDICATED_PAD | Freq: Every day | CUTANEOUS | Status: DC

## 2019-06-11 MED ORDER — PANTOPRAZOLE SODIUM 40 MG IV SOLR: 40.0000 mg | Freq: Two times a day (BID) | INTRAVENOUS | Status: DC

## 2019-06-11 MED ORDER — PANTOPRAZOLE SODIUM 40 MG PO TBEC
40.0000 mg | DELAYED_RELEASE_TABLET | Freq: Two times a day (BID) | ORAL | Status: DC
  Administered 2019-06-12 – 2019-06-16 (×9): 40 mg via ORAL
  Filled 2019-06-11 (×9): qty 1

## 2019-06-11 MED ORDER — IOHEXOL 300 MG/ML  SOLN
100.0000 mL | Freq: Once | INTRAMUSCULAR | Status: AC | PRN
  Administered 2019-06-11: 100 mL via INTRAVENOUS

## 2019-06-11 MED ORDER — PROPOFOL 10 MG/ML IV BOLUS
INTRAVENOUS | Status: DC | PRN
  Administered 2019-06-11: 20 mg via INTRAVENOUS
  Administered 2019-06-11: 40 mg via INTRAVENOUS
  Administered 2019-06-11: 160 mg via INTRAVENOUS

## 2019-06-11 MED ORDER — PHENYLEPHRINE HCL (PRESSORS) 10 MG/ML IV SOLN
INTRAVENOUS | Status: DC | PRN
  Administered 2019-06-11 (×4): 100 ug via INTRAVENOUS
  Administered 2019-06-11: 200 ug via INTRAVENOUS

## 2019-06-11 MED ORDER — LIDOCAINE HCL (CARDIAC) PF 100 MG/5ML IV SOSY
PREFILLED_SYRINGE | INTRAVENOUS | Status: DC | PRN
  Administered 2019-06-11: 80 mg via INTRAVENOUS

## 2019-06-11 MED ORDER — PANTOPRAZOLE SODIUM 40 MG IV SOLR
40.0000 mg | Freq: Two times a day (BID) | INTRAVENOUS | Status: DC
  Administered 2019-06-11: 40 mg via INTRAVENOUS
  Filled 2019-06-11: qty 40

## 2019-06-11 MED ORDER — ONDANSETRON HCL 4 MG/2ML IJ SOLN
INTRAMUSCULAR | Status: DC | PRN
  Administered 2019-06-11: 4 mg via INTRAVENOUS

## 2019-06-11 MED ORDER — VASOPRESSIN 20 UNIT/ML IV SOLN
INTRAVENOUS | Status: DC | PRN
  Administered 2019-06-11 (×3): 1 [IU] via INTRAVENOUS

## 2019-06-11 MED ORDER — VANCOMYCIN HCL 1.25 G IV SOLR
1250.0000 mg | INTRAVENOUS | Status: DC
  Administered 2019-06-11 – 2019-06-12 (×2): 1250 mg via INTRAVENOUS
  Filled 2019-06-11 (×3): qty 1250

## 2019-06-11 MED ORDER — OXYCODONE HCL 5 MG PO TABS: 5.0000 mg | ORAL_TABLET | Freq: Once | ORAL | Status: DC | PRN

## 2019-06-11 MED ORDER — EPHEDRINE SULFATE 50 MG/ML IJ SOLN
INTRAMUSCULAR | Status: DC | PRN
  Administered 2019-06-11: 10 mg via INTRAVENOUS

## 2019-06-11 MED ORDER — SODIUM POLYSTYRENE SULFONATE 15 GM/60ML PO SUSP
15.0000 g | Freq: Once | ORAL | Status: AC
  Administered 2019-06-11: 12:00:00 15 g via ORAL
  Filled 2019-06-11: qty 60

## 2019-06-11 MED ORDER — MIDAZOLAM HCL 2 MG/2ML IJ SOLN
INTRAMUSCULAR | Status: AC
  Filled 2019-06-11: qty 2

## 2019-06-11 MED ORDER — FENTANYL CITRATE (PF) 100 MCG/2ML IJ SOLN: 25.0000 ug | INTRAMUSCULAR | Status: DC | PRN

## 2019-06-11 MED ORDER — MUPIROCIN 2 % EX OINT
1.0000 "application " | TOPICAL_OINTMENT | Freq: Two times a day (BID) | CUTANEOUS | Status: DC
  Administered 2019-06-12 – 2019-06-16 (×9): 1 via NASAL
  Filled 2019-06-11: qty 22

## 2019-06-11 SURGICAL SUPPLY — 32 items
BNDG ELASTIC 3X5.8 VLCR NS LF (GAUZE/BANDAGES/DRESSINGS) ×3 IMPLANT
BNDG ESMARK 4X12 TAN STRL LF (GAUZE/BANDAGES/DRESSINGS) ×3 IMPLANT
CANISTER SUCT 1200ML W/VALVE (MISCELLANEOUS) ×3 IMPLANT
CAST PADDING 3X4FT ST 30246 (SOFTGOODS) ×2
COVER WAND RF STERILE (DRAPES) ×3 IMPLANT
CUFF TOURN SGL QUICK 18X4 (TOURNIQUET CUFF) ×2 IMPLANT
CUFF TOURN SGL QUICK 24 (TOURNIQUET CUFF)
CUFF TRNQT CYL 24X4X16.5-23 (TOURNIQUET CUFF) IMPLANT
DRSG DERMACEA 8X12 NADH (GAUZE/BANDAGES/DRESSINGS) ×3 IMPLANT
DRSG GAUZE FLUFF 36X18 (GAUZE/BANDAGES/DRESSINGS) ×6 IMPLANT
DURAPREP 26ML APPLICATOR (WOUND CARE) ×1 IMPLANT
ELECT CAUTERY BLADE 6.4 (BLADE) ×2 IMPLANT
ELECT REM PT RETURN 9FT ADLT (ELECTROSURGICAL) ×3
ELECTRODE REM PT RTRN 9FT ADLT (ELECTROSURGICAL) ×1 IMPLANT
GAUZE SPONGE 4X4 12PLY STRL (GAUZE/BANDAGES/DRESSINGS) ×3 IMPLANT
GLOVE BIOGEL M STRL SZ7.5 (GLOVE) ×5 IMPLANT
GOWN STRL REUS W/ TWL LRG LVL3 (GOWN DISPOSABLE) ×2 IMPLANT
GOWN STRL REUS W/TWL LRG LVL3 (GOWN DISPOSABLE) ×4
IV CATH ANGIO 14GX1.88 NO SAFE (IV SOLUTION) ×2 IMPLANT
KIT TURNOVER CYSTO (KITS) ×3 IMPLANT
LOOP VESSEL SUPERMAXI WHITE (MISCELLANEOUS) ×2 IMPLANT
NS IRRIG 500ML POUR BTL (IV SOLUTION) ×3 IMPLANT
PACK EXTREMITY ARMC (MISCELLANEOUS) ×3 IMPLANT
PAD CAST CTTN 3X4 STRL (SOFTGOODS) ×1 IMPLANT
PAD PREP 24X41 OB/GYN DISP (PERSONAL CARE ITEMS) ×3 IMPLANT
SLEEVE SCD COMPRESS THIGH MED (MISCELLANEOUS) ×2 IMPLANT
STOCKINETTE 48X4 2 PLY STRL (GAUZE/BANDAGES/DRESSINGS) ×1 IMPLANT
STOCKINETTE BIAS CUT 4 980044 (GAUZE/BANDAGES/DRESSINGS) ×2 IMPLANT
STOCKINETTE STRL 4IN 9604848 (GAUZE/BANDAGES/DRESSINGS) ×3 IMPLANT
SUT ETHILON 4 0 P 3 18 (SUTURE) ×3 IMPLANT
SWAB CULTURE AMIES ANAERIB BLU (MISCELLANEOUS) ×4 IMPLANT
SYR 30ML LL (SYRINGE) ×2 IMPLANT

## 2019-06-11 NOTE — Progress Notes (Signed)
PHARMACY - PHYSICIAN COMMUNICATION CRITICAL VALUE ALERT - BLOOD CULTURE IDENTIFICATION (BCID)  Nathan [name] is an 58 y.o. male who presented to [facility] on [facility]/10/2019 with a chief complaint of wound pain  Assessment:  1/4 bottles(anaerobic) positive for Staph Species, Staph Aureus, mecA(+)  Name of physician (or Provider) Contacted: Dr. Brett Albino  Current antibiotics: Vancomycin, Cefepime  Changes to prescribed antibiotics recommended: 3 Will continue current antibiotic regimen as hospitalist would like to continue empiric treatment. Patient is on recommended antibiotics - No changes needed  No results found for this or any previous visit.  [name] 06/11/2019  7:19 PM

## 2019-06-11 NOTE — Anesthesia Preprocedure Evaluation (Addendum)
Anesthesia Evaluation  Patient identified by MRN, date of birth, ID band Patient awake    Reviewed: Allergy & Precautions, H&P , NPO status , Patient's Chart, lab work & pertinent test results  Airway Mallampati: II  TM Distance: >3 FB Neck ROM: full    Dental  (+) Chipped, Poor Dentition, Loose   Pulmonary neg pulmonary ROS, neg shortness of breath, neg COPD, neg recent URI, Not current smoker,           Cardiovascular (-) angina(-) Past MI and (-) Cardiac Stents + dysrhythmias (reports brief episode of A fib years ago while in hospital that resolved on its own, no medications, does not follow with cardiology) Atrial Fibrillation      Neuro/Psych neg Seizures negative neurological ROS  negative psych ROS   GI/Hepatic negative GI ROS, Neg liver ROS, neg GERD  ,  Endo/Other  negative endocrine ROS  Renal/GU negative Renal ROS     Musculoskeletal   Abdominal   Peds  Hematology negative hematology ROS (+)   Anesthesia Other Findings History reviewed.   Reproductive/Obstetrics negative OB ROS                           Anesthesia Physical Anesthesia Plan  ASA: II  Anesthesia Plan: General LMA   Post-op Pain Management:    Induction:   PONV Risk Score and Plan: Dexamethasone, Ondansetron, Midazolam and Treatment may vary due to age or medical condition  Airway Management Planned:   Additional Equipment:   Intra-op Plan:   Post-operative Plan:   Informed Consent: I have reviewed the patients History and Physical, chart, labs and discussed the procedure including the risks, benefits and alternatives for the proposed anesthesia with the patient or authorized representative who has indicated his/her understanding and acceptance.     Dental Advisory Given  Plan Discussed with: Anesthesiologist and CRNA  Anesthesia Plan Comments:         Anesthesia Quick Evaluation

## 2019-06-11 NOTE — Op Note (Signed)
OPERATIVE NOTE  DATE OF SURGERY:  06/11/2019  PATIENT NAME:  Nathan [name]   DOB: 1961-08-26  MRN: [id number]   PRE-OPERATIVE DIAGNOSIS: Abscess of the right thenar space  POST-OPERATIVE DIAGNOSIS:  Same  PROCEDURE: Incision, irrigation, and debridement of the right thenar abscess  SURGEON:  Marciano Sequin., M.D.   ANESTHESIA: general  ESTIMATED BLOOD LOSS: Minimal  FLUIDS REPLACED: 10 00 mL of crystalloid  TOURNIQUET TIME: 64 minutes  DRAINS: 2 Vessel loops              INDICATIONS FOR SURGERY: [name] is a 58 y.o. year old male who has been seen for complaints of pain, swelling, and draining lesions from the right thumb. After discussion of the risks and benefits of surgical intervention, the patient expressed understanding of the risks benefits and agree with plans for incision, irrigation, and debridement of the right thenar space.   PROCEDURE IN DETAIL: The patient was brought into the operating room and, after adequate general anesthesia was achieved, tourniquet was placed on the patient's upper right arm.  The patient's right hand and arm were cleaned and prepped with Betadine and draped in usual sterile fashion.  A "timeout" was performed as per usual protocol.  The right upper extremity was elevated and the tourniquet was inflated to 250 mmHg.  Loupe magnification was used throughout the procedure.  A longitudinal dorsal incision was made in the first webspace and carried down with blunt dissection into the lateral thenar space.  Copious amounts of purulent material was expressed.  Swabs were obtained for Gram stain, culture, and sensitivity.  Next, a curved incision was made along the volar thenar crease.  Then dissection was carefully continued in line with the skin incision.  The digital nerves to the index finger were identified and carefully retracted.  The flexor tendons to the index finger was also then identified and was reflected radially with the first  lumbrical.  The lateral space was then entered by blunt dissection.  Swabs were obtained for culture and sensitivity.  Next, both the volar and dorsal wounds were irrigated with copious amounts of normal saline with antibiotic solution until clear.  Vascular loops were inserted in the wound beds incisions of volar and dorsal).  The surgical incisions were loosely approximated with interrupted sutures of #4-0 nylon.  Finally, the ulcerations to the dorsum of the thumb were sharply debrided using scissors.  Owens gauze dipped in Betadine were used to dress the incision sites and the wound sites.  A bulky dressing was applied.  Tourniquet was deflated after total tourniquet time of 64 minutes.  The patient tolerated procedure well.  He was transported to the recovery room in stable condition.   James P. Holley Bouche M.D.

## 2019-06-11 NOTE — Transfer of Care (Signed)
Immediate Anesthesia Transfer of Care Note  Patient: Nathan [name]  Procedure(s) Performed: IRRIGATION AND DEBRIDEMENT RIGHT THUMB (Right )  Patient Location: PACU  Anesthesia Type:General  Level of Consciousness: sedated  Airway & Oxygen Therapy: Patient Spontanous Breathing and Patient connected to face mask oxygen  Post-op Assessment: Report given to RN and Post -op Vital signs reviewed and stable  Post vital signs: Reviewed and stable  Last Vitals:  Vitals Value Taken Time  BP 116/65 06/11/19 2348  Temp 36.7 C 06/11/19 2348  Pulse 95 06/11/19 2353  Resp 21 06/11/19 2353  SpO2 98 % 06/11/19 2353  Vitals shown include unvalidated device data.  Last Pain:  Vitals:   06/11/19 2348  TempSrc:   PainSc: (P) 0-No pain         Complications: No apparent anesthesia complications

## 2019-06-11 NOTE — Consult Note (Signed)
Pharmacy Antibiotic Note  Nathan [name] is a 58 y.o. male admitted on 06/10/2019 with sepsis.  Pharmacy has been consulted for Cefepime/Vancomycin  dosing.  Patient received Vancomycin 1500 mg and Ceftriaxone in the ED.  Scr 1.68>1.46   Plan: 1. Cefepime 2 g IV Q12H  2. Improvement in renal function. Will increase dose to keep trough >10. Will order Vancomycin 1250 mg Q24H- next dose due @ 1800.  Will check renal function with AM labs.  AUC Goal 400-550 Expected AUC 515.8 Cssmin 12.1 SCr used: 1.46  Height: 5\' 9"  (175.3 cm) Weight: 150 lb (68 kg) IBW/kg (Calculated) : 70.7  Temp (24hrs), Avg:98.2 F (36.8 C), Min:97.9 F (36.6 C), Max:98.6 F (37 C)  Recent Labs  Lab 06/10/19 1451 06/10/19 1632 06/10/19 2027 06/10/19 2317 06/11/19 0420  WBC 29.4*  --   --   --  23.3*  CREATININE 1.68*  --   --   --  1.46*  LATICACIDVEN  --  1.0 1.7 0.9  --     Estimated Creatinine Clearance: 54.3 mL/min (A) (by C-G formula based on SCr of 1.46 mg/dL (H)).    No Known Allergies  CTX 9/20 x1 Cefepime 9/20 >> Vanc 9/20 >>  BCx x2 NGTD  Thank you for allowing pharmacy to be a part of this patient's care.  Rocky Morel 06/11/2019 10:17 AM

## 2019-06-11 NOTE — Anesthesia Procedure Notes (Signed)
Procedure Name: LMA Insertion Date/Time: 06/11/2019 10:00 PM Performed by: Lendon Colonel, CRNA Pre-anesthesia Checklist: Patient identified, Patient being monitored, Timeout performed, Emergency Drugs available and Suction available Patient Re-evaluated:Patient Re-evaluated prior to induction Oxygen Delivery Method: Circle system utilized Preoxygenation: Pre-oxygenation with 100% oxygen Induction Type: IV induction Ventilation: Mask ventilation without difficulty LMA: LMA inserted LMA Size: 4.0 Tube type: Oral Number of attempts: 1 Placement Confirmation: positive ETCO2 and breath sounds checked- equal and bilateral Tube secured with: Tape Dental Injury: Teeth and Oropharynx as per pre-operative assessment

## 2019-06-11 NOTE — Anesthesia Post-op Follow-up Note (Signed)
Anesthesia QCDR form completed.        

## 2019-06-11 NOTE — Progress Notes (Addendum)
[facility] [place] NAME: Nathan [name]    MR#:  [id number]  DATE OF BIRTH:  Mar 17, 1961  SUBJECTIVE:  CHIEF COMPLAINT:   Chief Complaint  Patient presents with   Wound Infection  Patient seen and evaluated today Has bandage to the right thumb and right hand Pain in the right hand noted No fever  REVIEW OF SYSTEMS:    ROS  CONSTITUTIONAL: No documented fever. No fatigue, weakness. No weight gain, no weight loss.  EYES: No blurry or double vision.  ENT: No tinnitus. No postnasal drip. No redness of the oropharynx.  RESPIRATORY: No cough, no wheeze, no hemoptysis. No dyspnea.  CARDIOVASCULAR: No chest pain. No orthopnea. No palpitations. No syncope.  GASTROINTESTINAL: No nausea, no vomiting or diarrhea. No abdominal pain. No melena or hematochezia.  GENITOURINARY: No dysuria or hematuria.  ENDOCRINE: No polyuria or nocturia. No heat or cold intolerance.  HEMATOLOGY: No anemia. No bruising. No bleeding.  INTEGUMENTARY: No rashes. No lesions.  MUSCULOSKELETAL: No arthritis.  No gout.  Swelling of the right hand NEUROLOGIC: No numbness, tingling, or ataxia. No seizure-type activity.  PSYCHIATRIC: No anxiety. No insomnia. No ADD.   DRUG ALLERGIES:  No Known Allergies  VITALS:  Blood pressure (!) 150/79, pulse (!) 103, temperature 98.6 F (37 C), temperature source Oral, resp. rate 18, height 5\' 9"  (1.753 m), weight 68 kg, SpO2 94 %.  PHYSICAL EXAMINATION:   Physical Exam  GENERAL:  58 y.o.-year-old patient lying in the bed with no acute distress.  EYES: Pupils equal, round, reactive to light and accommodation. No scleral icterus. Extraocular muscles intact.  HEENT: Head atraumatic, normocephalic. Oropharynx and nasopharynx clear.  NECK:  Supple, no jugular venous distention. No thyroid enlargement, no tenderness.  LUNGS: Normal breath sounds bilaterally, no wheezing, rales, rhonchi. No use of accessory muscles of respiration.    CARDIOVASCULAR: S1, S2 normal. No murmurs, rubs, or gallops.  ABDOMEN: Soft, nontender, nondistended. Bowel sounds present. No organomegaly or mass.  EXTREMITIES: No cyanosis, clubbing or edema b/l.   Purulent discharge from the wound in the right thumb posterior surface NEUROLOGIC: Cranial nerves II through XII are intact. No focal Motor or sensory deficits b/l.   PSYCHIATRIC: The patient is alert and oriented x 3.  SKIN: Wound noted in the right thumb area with redness of the skin and discharge     LABORATORY PANEL:   CBC Recent Labs  Lab 06/11/19 0420  WBC 23.3*  HGB 9.0*  HCT 27.2*  PLT 686*   ------------------------------------------------------------------------------------------------------------------ Chemistries  Recent Labs  Lab 06/11/19 0420  NA 134*  K 5.2*  CL 108  CO2 19*  GLUCOSE 131*  BUN 51*  CREATININE 1.46*  CALCIUM 7.5*  MG 2.5*   ------------------------------------------------------------------------------------------------------------------  Cardiac Enzymes No results for input(s): TROPONINI in the last 168 hours. ------------------------------------------------------------------------------------------------------------------  RADIOLOGY:  Ct Hand Right W Contrast  Result Date: 06/11/2019 CLINICAL DATA:  Sepsis. Right hand swelling. EXAM: CT OF THE UPPER RIGHT EXTREMITY WITH CONTRAST TECHNIQUE: Multidetector CT imaging of the upper right extremity was performed according to the standard protocol following intravenous contrast administration. COMPARISON:  Radiographs dated 06/10/2019 CONTRAST:  150mL OMNIPAQUE IOHEXOL 300 MG/ML  SOLN FINDINGS: Muscles and Tendons and soft tissues There is an extensive abscess extending from the dorsal aspect of the base of the thumb at the site of the soft tissue ulcer near the base of the proximal phalanx. The abscess extends into the palm involving the abductor pollicis brevis  and adductor pollicis muscles  extending across the palm extending to the volar surface of the head of the third metacarpal best seen on image 85 of series 6. The abscess is at least 6.3 x 3.4 x 2.3 cm. The mass is deep to the flexor tendons of first, second and third digits at the level of the metacarpals. Bones/Joint/Cartilage No evidence of osteomyelitis or other acute abnormality. IMPRESSION: 1. Extensive abscess in the palm extending from the dorsal aspect of the base of the thumb to the volar surface of the head of the third metacarpal. 2. No evidence of osteomyelitis. Electronically Signed   By: Lorriane Shire M.D.   On: 06/11/2019 10:15   Dg Hand Complete Right  Result Date: 06/10/2019 CLINICAL DATA:  Infection in right thumb EXAM: RIGHT HAND - COMPLETE 3+ VIEW COMPARISON:  None. FINDINGS: No fracture or dislocation of the right hand. Joint spaces are well preserved. Soft tissue edema about the right thumb. IMPRESSION: Soft tissue edema about the right thumb. No fracture, dislocation, or other osseous abnormality. Electronically Signed   By: Eddie Candle M.D.   On: 06/10/2019 17:14     ASSESSMENT AND PLAN:    Patient is a 58 year old male with history of hypertension and diabetes mellitus admitted for management of sepsis secondary to right thumb infection and currently under hospitalist service.  1.  Sepsis secondary to right thumb infection Patient started on broad-spectrum IV antibiotics with IV vancomycin and cefepime with pharmacy to dose. IV fluid hydration. Patient having purulent discharge from the right thumb.  Requested for orthopedic consult for evaluation for possible I&D.  Surgery follow-up X-rays done with no evidence of fracture or dislocation or osseous abnormality. Kept n.p.o. for now while awaiting evaluation from surgeon for surgical intervention  2.  Right thumb and hand abscess Broad-spectrum antibiotics and orthopedic surgery follow-up for drainage CT of the hand revealed abscess  2.   Hypertension Medical management to continue  3.  Diabetes mellitus type 2 Placed on sliding scale insulin coverage.  Glycosylated hemoglobin level in a.m.  4. DVT prophylaxis; SCDs for now Will initiate Lovenox if no plans for any surgical intervention.  5.  Hyperkalemia Oral Kayexalate for now  All the records are reviewed and case discussed with ED provider. Management plans discussed with the patient, family and they are in agreement.  CODE STATUS: Full code All the records are reviewed and case discussed with Care Management/Social Worker. Management plans discussed with the patient, family and they are in agreement.  CODE STATUS: Full code  DVT Prophylaxis: SCDs  TOTAL TIME TAKING CARE OF THIS PATIENT: 36 minutes.   POSSIBLE D/C IN 2 to 3 DAYS, DEPENDING ON CLINICAL CONDITION.  Saundra Shelling M.D on 06/11/2019 at 10:33 AM  Between 7am to 6pm - Pager - [phone]  After 6pm go to [email or web address] - password [email or web address] [facility]  Office  [phone]  CC: Primary care physician; Patient, No Pcp Per  Note: This dictation was prepared with Dragon dictation along with smaller phrase technology. Any transcriptional errors that result from this process are unintentional.

## 2019-06-11 NOTE — Progress Notes (Signed)
Advanced care plan. Purpose of the Encounter: CODE STATUS Parties in Attendance: Patient Patient's Decision Capacity: Good Subjective/Patient's story: Nathan [name]  is a 58 y.o. male with a known history of diabetes mellitus and hypertension who presented to the emergency room with infected right stump.  This initially started about 8 days ago as a blister with redness and swelling involving the right stump.  Patient was seen at the urgent care about 6 days ago and started on Augmentin.  Within the last 2 days patient started having more swelling and redness and purulent drainage.  Patient denies any trauma.  Does not recall any insect bite.  Patient was sent to the emergency room from the urgent care for further evaluation.  Patient found to have significant leukocytosis with white count of 29,000 and was tachycardic on arrival.  X-ray of the right thumb done revealed soft tissue edema about the right thumb. No fracture, dislocation, or other osseous abnormality.  Patient diagnosed with sepsis secondary to right stump infection.  Medical service called to admit patient for further evaluation and management. Objective/Medical story Needs IV antibiotics and drainage by surgery Goals of care determination:  Advance care directives and goals of care discussed Patient wants full resuscitation CODE STATUS: Full code Time spent discussing advanced care planning: 16 minutes

## 2019-06-12 ENCOUNTER — Encounter: Payer: Self-pay | Admitting: Orthopedic Surgery

## 2019-06-12 DIAGNOSIS — N179 Acute kidney failure, unspecified: Secondary | ICD-10-CM

## 2019-06-12 DIAGNOSIS — L02511 Cutaneous abscess of right hand: Secondary | ICD-10-CM

## 2019-06-12 DIAGNOSIS — R7881 Bacteremia: Secondary | ICD-10-CM

## 2019-06-12 DIAGNOSIS — E119 Type 2 diabetes mellitus without complications: Secondary | ICD-10-CM

## 2019-06-12 DIAGNOSIS — B9562 Methicillin resistant Staphylococcus aureus infection as the cause of diseases classified elsewhere: Secondary | ICD-10-CM

## 2019-06-12 LAB — CBC
HCT: 26.6 % — ABNORMAL LOW (ref 39.0–52.0)
Hemoglobin: 8.5 g/dL — ABNORMAL LOW (ref 13.0–17.0)
MCH: 29.2 pg (ref 26.0–34.0)
MCHC: 32 g/dL (ref 30.0–36.0)
MCV: 91.4 fL (ref 80.0–100.0)
Platelets: 702 10*3/uL — ABNORMAL HIGH (ref 150–400)
RBC: 2.91 MIL/uL — ABNORMAL LOW (ref 4.22–5.81)
RDW: 12.4 % (ref 11.5–15.5)
WBC: 25.6 10*3/uL — ABNORMAL HIGH (ref 4.0–10.5)
nRBC: 0 % (ref 0.0–0.2)

## 2019-06-12 LAB — GLUCOSE, CAPILLARY
Glucose-Capillary: 110 mg/dL — ABNORMAL HIGH (ref 70–99)
Glucose-Capillary: 139 mg/dL — ABNORMAL HIGH (ref 70–99)
Glucose-Capillary: 160 mg/dL — ABNORMAL HIGH (ref 70–99)
Glucose-Capillary: 193 mg/dL — ABNORMAL HIGH (ref 70–99)

## 2019-06-12 LAB — BASIC METABOLIC PANEL
Anion gap: 9 (ref 5–15)
BUN: 51 mg/dL — ABNORMAL HIGH (ref 6–20)
CO2: 17 mmol/L — ABNORMAL LOW (ref 22–32)
Calcium: 7.5 mg/dL — ABNORMAL LOW (ref 8.9–10.3)
Chloride: 111 mmol/L (ref 98–111)
Creatinine, Ser: 1.74 mg/dL — ABNORMAL HIGH (ref 0.61–1.24)
GFR calc Af Amer: 50 mL/min — ABNORMAL LOW (ref >60)
GFR calc non Af Amer: 43 mL/min — ABNORMAL LOW (ref >60)
Glucose, Bld: 135 mg/dL — ABNORMAL HIGH (ref 70–99)
Potassium: 5.3 mmol/L — ABNORMAL HIGH (ref 3.5–5.1)
Sodium: 137 mmol/L (ref 135–145)

## 2019-06-12 LAB — HIV ANTIBODY (ROUTINE TESTING W REFLEX): HIV Screen 4th Generation wRfx: NONREACTIVE

## 2019-06-12 MED ORDER — ENOXAPARIN SODIUM 40 MG/0.4ML ~~LOC~~ SOLN
40.0000 mg | SUBCUTANEOUS | Status: DC
  Administered 2019-06-12: 40 mg via SUBCUTANEOUS
  Filled 2019-06-12: qty 0.4

## 2019-06-12 MED ORDER — BISACODYL 5 MG PO TBEC: 5.0000 mg | DELAYED_RELEASE_TABLET | Freq: Every day | ORAL | Status: DC | PRN

## 2019-06-12 MED ORDER — MAGNESIUM HYDROXIDE 400 MG/5ML PO SUSP
30.0000 mL | Freq: Every day | ORAL | Status: DC | PRN
  Filled 2019-06-12: qty 30

## 2019-06-12 MED ORDER — OXYCODONE HCL 5 MG PO TABS: 5.0000 mg | ORAL_TABLET | ORAL | Status: DC | PRN

## 2019-06-12 MED ORDER — HEPARIN SODIUM (PORCINE) 5000 UNIT/ML IJ SOLN: 5000.0000 [IU] | Freq: Three times a day (TID) | INTRAMUSCULAR | Status: DC

## 2019-06-12 MED ORDER — PATIROMER SORBITEX CALCIUM 8.4 G PO PACK
16.8000 g | PACK | Freq: Once | ORAL | Status: AC
  Administered 2019-06-12: 16.8 g via ORAL
  Filled 2019-06-12 (×2): qty 2

## 2019-06-12 MED ORDER — HEPARIN SODIUM (PORCINE) 5000 UNIT/ML IJ SOLN
5000.0000 [IU] | Freq: Three times a day (TID) | INTRAMUSCULAR | Status: DC
  Administered 2019-06-13: 5000 [IU] via SUBCUTANEOUS
  Filled 2019-06-12: qty 1

## 2019-06-12 MED ORDER — SENNA 8.6 MG PO TABS
1.0000 | ORAL_TABLET | Freq: Two times a day (BID) | ORAL | Status: DC
  Administered 2019-06-12 – 2019-06-16 (×9): 8.6 mg via ORAL
  Filled 2019-06-12 (×9): qty 1

## 2019-06-12 MED ORDER — OXYCODONE HCL 5 MG PO TABS: 10.0000 mg | ORAL_TABLET | ORAL | Status: DC | PRN

## 2019-06-12 MED ORDER — MAGNESIUM CITRATE PO SOLN
1.0000 | Freq: Once | ORAL | Status: DC | PRN
  Filled 2019-06-12: qty 296

## 2019-06-12 MED ORDER — SODIUM CHLORIDE 0.9 % IV SOLN
INTRAVENOUS | Status: DC
  Administered 2019-06-12 (×2): via INTRAVENOUS

## 2019-06-12 MED ORDER — HYDROMORPHONE HCL 1 MG/ML IJ SOLN: 0.5000 mg | INTRAMUSCULAR | Status: DC | PRN

## 2019-06-12 MED ORDER — ACETAMINOPHEN 325 MG PO TABS: 325.0000 mg | ORAL_TABLET | Freq: Four times a day (QID) | ORAL | Status: DC | PRN

## 2019-06-12 MED ORDER — PHENOL 1.4 % MT LIQD
1.0000 | OROMUCOSAL | Status: DC | PRN
  Filled 2019-06-12: qty 177

## 2019-06-12 NOTE — Progress Notes (Signed)
[facility] NAME: Nathan [name]    MR#:  [id number]  DATE OF BIRTH:  02/03/1961  SUBJECTIVE:  CHIEF COMPLAINT:   Chief Complaint  Patient presents with  . Wound Infection  Patient seen and evaluated today Has bandage to the right thumb and right hand Pain in the right hand better Status post incision and drainage of the right hand abscess No fever  REVIEW OF SYSTEMS:    ROS  CONSTITUTIONAL: No documented fever. No fatigue, weakness. No weight gain, no weight loss.  EYES: No blurry or double vision.  ENT: No tinnitus. No postnasal drip. No redness of the oropharynx.  RESPIRATORY: No cough, no wheeze, no hemoptysis. No dyspnea.  CARDIOVASCULAR: No chest pain. No orthopnea. No palpitations. No syncope.  GASTROINTESTINAL: No nausea, no vomiting or diarrhea. No abdominal pain. No melena or hematochezia.  GENITOURINARY: No dysuria or hematuria.  ENDOCRINE: No polyuria or nocturia. No heat or cold intolerance.  HEMATOLOGY: No anemia. No bruising. No bleeding.  INTEGUMENTARY: No rashes. No lesions.  MUSCULOSKELETAL: No arthritis.  No gout.  Right hand bandage noted NEUROLOGIC: No numbness, tingling, or ataxia. No seizure-type activity.  PSYCHIATRIC: No anxiety. No insomnia. No ADD.   DRUG ALLERGIES:  No Known Allergies  VITALS:  Blood pressure 121/72, pulse 95, temperature 97.6 F (36.4 C), temperature source Oral, resp. rate 16, height 5\' 9"  (1.753 m), weight 68 kg, SpO2 94 %.  PHYSICAL EXAMINATION:   Physical Exam  GENERAL:  58 y.o.-year-old patient lying in the bed with no acute distress.  EYES: Pupils equal, round, reactive to light and accommodation. No scleral icterus. Extraocular muscles intact.  HEENT: Head atraumatic, normocephalic. Oropharynx and nasopharynx clear.  NECK:  Supple, no jugular venous distention. No thyroid enlargement, no tenderness.  LUNGS: Normal breath sounds bilaterally, no wheezing, rales,  rhonchi. No use of accessory muscles of respiration.  CARDIOVASCULAR: S1, S2 normal. No murmurs, rubs, or gallops.  ABDOMEN: Soft, nontender, nondistended. Bowel sounds present. No organomegaly or mass.  EXTREMITIES: No cyanosis, clubbing or edema b/l.   Right hand bandage noted NEUROLOGIC: Cranial nerves II through XII are intact. No focal Motor or sensory deficits b/l.   PSYCHIATRIC: The patient is alert and oriented x 3.  SKIN: Wound noted in the right thumb area with redness of the skin and discharge     LABORATORY PANEL:   CBC Recent Labs  Lab 06/12/19 0414  WBC 25.6*  HGB 8.5*  HCT 26.6*  PLT 702*   ------------------------------------------------------------------------------------------------------------------ Chemistries  Recent Labs  Lab 06/11/19 0420 06/12/19 0414  NA 134* 137  K 5.2* 5.3*  CL 108 111  CO2 19* 17*  GLUCOSE 131* 135*  BUN 51* 51*  CREATININE 1.46* 1.74*  CALCIUM 7.5* 7.5*  MG 2.5*  --    ------------------------------------------------------------------------------------------------------------------  Cardiac Enzymes No results for input(s): TROPONINI in the last 168 hours. ------------------------------------------------------------------------------------------------------------------  RADIOLOGY:  Ct Hand Right W Contrast  Result Date: 06/11/2019 CLINICAL DATA:  Sepsis. Right hand swelling. EXAM: CT OF THE UPPER RIGHT EXTREMITY WITH CONTRAST TECHNIQUE: Multidetector CT imaging of the upper right extremity was performed according to the standard protocol following intravenous contrast administration. COMPARISON:  Radiographs dated 06/10/2019 CONTRAST:  165mL OMNIPAQUE IOHEXOL 300 MG/ML  SOLN FINDINGS: Muscles and Tendons and soft tissues There is an extensive abscess extending from the dorsal aspect of the base of the thumb at the site of the soft tissue ulcer near the base of the proximal  phalanx. The abscess extends into the palm involving  the abductor pollicis brevis and adductor pollicis muscles extending across the palm extending to the volar surface of the head of the third metacarpal best seen on image 85 of series 6. The abscess is at least 6.3 x 3.4 x 2.3 cm. The mass is deep to the flexor tendons of first, second and third digits at the level of the metacarpals. Bones/Joint/Cartilage No evidence of osteomyelitis or other acute abnormality. IMPRESSION: 1. Extensive abscess in the palm extending from the dorsal aspect of the base of the thumb to the volar surface of the head of the third metacarpal. 2. No evidence of osteomyelitis. Electronically Signed   By: Lorriane Shire M.D.   On: 06/11/2019 10:15   Dg Hand Complete Right  Result Date: 06/10/2019 CLINICAL DATA:  Infection in right thumb EXAM: RIGHT HAND - COMPLETE 3+ VIEW COMPARISON:  None. FINDINGS: No fracture or dislocation of the right hand. Joint spaces are well preserved. Soft tissue edema about the right thumb. IMPRESSION: Soft tissue edema about the right thumb. No fracture, dislocation, or other osseous abnormality. Electronically Signed   By: Eddie Candle M.D.   On: 06/10/2019 17:14     ASSESSMENT AND PLAN:    Patient is a 58 year old male with history of hypertension and diabetes mellitus admitted for management of sepsis secondary to right thumb infection and currently under hospitalist service.  1.  Sepsis secondary to right thumb infection improving Secondary to staph aureus bacteremia Antibiotics narrowed down to IV vancomycin Cefepime discontinued Appreciate ID follow-up IV fluid hydration.   2.  Right thumb and hand abscess Status post incision and drainage by surgery Continue antibiotics CT of the hand revealed abscess  2.  Hypertension Medical management to continue  3.  Diabetes mellitus type 2 Placed on sliding scale insulin coverage.  Glycosylated hemoglobin level in a.m.  4. DVT prophylaxis; SCDs for now Will initiate Lovenox if no  plans for any surgical intervention.  5.  Hyperkalemia Start Veltassa  All the records are reviewed and case discussed with ED provider. Management plans discussed with the patient, family and they are in agreement.  CODE STATUS: Full code All the records are reviewed and case discussed with Care Management/Social Worker. Management plans discussed with the patient, family and they are in agreement.  CODE STATUS: Full code  DVT Prophylaxis: SCDs  TOTAL TIME TAKING CARE OF THIS PATIENT: 35 minutes.   POSSIBLE D/C IN 2 to 3 DAYS, DEPENDING ON CLINICAL CONDITION.  Saundra Shelling M.D on 06/12/2019 at 11:43 AM  Between 7am to 6pm - Pager - [phone]  After 6pm go to [email or web address] - password [email or web address] [facility]  Office  [phone]  CC: Primary care physician; Patient, No Pcp Per  Note: This dictation was prepared with Dragon dictation along with smaller phrase technology. Any transcriptional errors that result from this process are unintentional.

## 2019-06-12 NOTE — Progress Notes (Signed)
Dr Jannifer Franklin notified of positive surgical PCR results

## 2019-06-12 NOTE — Consult Note (Signed)
NAME: Nathan [name]  DOB: [birth year]  MRN: [id number]  Date/Time: 06/12/2019 6:12 PM  REQUESTING PROVIDER:pyreddy Subjective:  REASON FOR CONSULT: MRSA bacteremia ? Nathan [name] is a 58 y.o. male with a history of diabetes mellitus, hypertension presented to the ED on 06/10/2019 with infection of the right thumb since 5 days.  He was seen initially in the urgent care and was prescribed oral antibiotic but he was failing therapy and hence was sent to the hospital. As per patient it started as a blister 8 days ago with redness and swelling involving the right thumb. 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   He was seen in urgent care and was given Augmentin.  But the swelling started to get worse along with purulent drainage and he came to the ED.  Vitals in the ED was blood pressure of 153/82, pulse rate of 96 and temperature of 98.  He was found to have a white count of 29,000 in the ED.  X-ray of the thumb revealed soft tissue edema. Blood cultures were sent and he was started on IV cefepime and vancomycin He had a CT scan of his hand on 06/11/2019 which showed extensive abscess in the palm extending from the dorsal aspect of the base of the thumb to the volar surface of the head of the third metacarpal.   .  Patient was seen by Dr. Marry Guan and underwent IND of the right thenar abscess. I am seeing the patient as the blood culture from 06/10/2019 is growing methicillin-resistant staph aureus.    Past Medical History:  Diagnosis Date  . Diabetes mellitus without complication ([place])    HTN afib once 17 yrs ago  Past Surgical History:  Procedure Laterality Date  . I&D EXTREMITY Right 06/11/2019   Procedure: IRRIGATION AND DEBRIDEMENT RIGHT THUMB;  Surgeon: Dereck Leep, MD;  Location: [facility] ORS;  Service: Orthopedics;  Laterality: Right;    Social History   Socioeconomic History  . Marital status: Married    Spouse name: Not on file  . Number of children: Not  on file  . Years of education: Not on file  . Highest education level: Not on file  Occupational History  . Not on file  Social Needs  . Financial resource strain: Not on file  . Food insecurity    Worry: Not on file    Inability: Not on file  . Transportation needs    Medical: Not on file    Non-medical: Not on file  Tobacco Use  . Smoking status: Never Smoker  . Smokeless tobacco: Never Used  Substance and Sexual Activity  . Alcohol use: Never    Frequency: Never  . Drug use: Never  . Sexual activity: Not on file  Lifestyle  . Physical activity    Days per week: Not on file    Minutes per session: Not on file  . Stress: Not on file  Relationships  . Social Herbalist on phone: Not on file    Gets together: Not on file    Attends religious service: Not on file    Active member of club or organization: Not on file    Attends meetings of clubs or organizations: Not on file    Relationship status: Not on file  . Intimate partner violence    Fear of current or ex partner: Not on file    Emotionally abused: Not on file  Physically abused: Not on file    Forced sexual activity: Not on file  Other Topics Concern  . Not on file  Social History Narrative  . Not on file    No Known Allergies  Family History MI father DM , sister, brother   Current Facility-Administered Medications  Medication Dose Route Frequency Provider Last Rate Last Dose  . 0.9 %  sodium chloride infusion   Intravenous Continuous Pyreddy, Reatha Harps, MD 100 mL/hr at 06/12/19 1714    . acetaminophen (TYLENOL) tablet 325-650 mg  325-650 mg Oral Q6H PRN Hooten, Laurice Record, MD      . bisacodyl (DULCOLAX) EC tablet 5 mg  5 mg Oral Daily PRN Dereck Leep, MD      . Derrill Memo ON 06/13/2019] heparin injection 5,000 Units  5,000 Units Subcutaneous Q8H Hallaji, Sheema M, RPH      . HYDROmorphone (DILAUDID) injection 0.5-1 mg  0.5-1 mg Intravenous Q4H PRN Hooten, Laurice Record, MD      . influenza vac split  quadrivalent PF (FLUARIX) injection 0.5 mL  0.5 mL Intramuscular Tomorrow-1000 Hooten, Laurice Record, MD      . insulin aspart (novoLOG) injection 0-5 Units  0-5 Units Subcutaneous QHS Hooten, James P, MD      . insulin aspart (novoLOG) injection 0-9 Units  0-9 Units Subcutaneous TID WC Hooten, Laurice Record, MD   2 Units at 06/12/19 1730  . magnesium citrate solution 1 Bottle  1 Bottle Oral Once PRN Hooten, Laurice Record, MD      . magnesium hydroxide (MILK OF MAGNESIA) suspension 30 mL  30 mL Oral Daily PRN Hooten, Laurice Record, MD      . mupirocin ointment (BACTROBAN) 2 % 1 application  1 application Nasal BID Hooten, Laurice Record, MD   1 application at [id number]  . oxyCODONE (Oxy IR/ROXICODONE) immediate release tablet 10-15 mg  10-15 mg Oral Q4H PRN Hooten, Laurice Record, MD      . oxyCODONE (Oxy IR/ROXICODONE) immediate release tablet 5-10 mg  5-10 mg Oral Q4H PRN Hooten, Laurice Record, MD      . pantoprazole (PROTONIX) EC tablet 40 mg  40 mg Oral BID Hooten, Laurice Record, MD   40 mg at 06/12/19 1108  . phenol (CHLORASEPTIC) mouth spray 1 spray  1 spray Mouth/Throat PRN Pyreddy, Pavan, MD      . senna (SENOKOT) tablet 8.6 mg  1 tablet Oral BID Hooten, Laurice Record, MD   8.6 mg at 06/12/19 1108  . vancomycin (VANCOCIN) 1,250 mg in sodium chloride 0.9 % 250 mL IVPB  1,250 mg Intravenous Q24H Hooten, Laurice Record, MD 166.7 mL/hr at 06/12/19 1730 1,250 mg at 06/12/19 1730     Abtx:  Anti-infectives (From admission, onward)   Start     Dose/Rate Route Frequency Ordered Stop   06/11/19 1800  vancomycin (VANCOCIN) IVPB 1000 mg/200 mL premix  Status:  Discontinued     1,000 mg 200 mL/hr over 60 Minutes Intravenous Every 24 hours 06/10/19 1750 06/11/19 1016   06/11/19 1800  vancomycin (VANCOCIN) 1,250 mg in sodium chloride 0.9 % 250 mL IVPB     1,250 mg 166.7 mL/hr over 90 Minutes Intravenous Every 24 hours 06/11/19 1016     06/10/19 1800  vancomycin (VANCOCIN) 1,500 mg in sodium chloride 0.9 % 500 mL IVPB     1,500 mg 250 mL/hr over 120  Minutes Intravenous  Once 06/10/19 1739 06/10/19 2013   06/10/19 1800  ceFEPIme (MAXIPIME) 2 g in sodium chloride  0.9 % 100 mL IVPB  Status:  Discontinued     2 g 200 mL/hr over 30 Minutes Intravenous Every 12 hours 06/10/19 1750 06/12/19 0911   06/10/19 1715  cefTRIAXone (ROCEPHIN) 2 g in sodium chloride 0.9 % 100 mL IVPB     2 g 200 mL/hr over 30 Minutes Intravenous  Once 06/10/19 1701 06/10/19 1757   06/10/19 1700  vancomycin (VANCOCIN) IVPB 1000 mg/200 mL premix  Status:  Discontinued     1,000 mg 200 mL/hr over 60 Minutes Intravenous  Once 06/10/19 1652 06/10/19 1739      REVIEW OF SYSTEMS:  Const: negative fever, negative chills, negative weight loss Eyes: negative diplopia or visual changes, negative eye pain ENT: negative coryza, negative sore throat Resp: negative cough, hemoptysis, dyspnea Cards: negative for chest pain, palpitations, lower extremity edema GU: negative for frequency, dysuria and hematuria GI: Negative for abdominal pain, diarrhea, bleeding, constipation Skin: negative for rash and pruritus Heme: negative for easy bruising and gum/nose bleeding MS: rt arm pain and swelling Neurolo:negative for headaches, dizziness, vertigo, memory problems  Psych: negative for feelings of anxiety, depression  Endocrine: negative for thyroid, diabetes Allergy/Immunology- negative for any medication or food allergies ? Pertinent Positives include : Objective:  VITALS:  BP 125/66 (BP Location: Right Arm)   Pulse 94   Temp 99 F (37.2 C) (Oral)   Resp 18   Ht 5\' 9"  (1.753 m)   Wt 68 kg   SpO2 92%   BMI 22.15 kg/m  PHYSICAL EXAM:  General: Alert, cooperative, no distress, appears stated age.  Head: Normocephalic, without obvious abnormality, atraumatic. Eyes: Conjunctivae clear, anicteric sclerae. Pupils are equal ENT Nares normal. No drainage or sinus tenderness. Lips, mucosa, and tongue normal. No Thrush Neck: Supple, symmetrical, no adenopathy, thyroid: non  tender no carotid bruit and no JVD. Back: No CVA tenderness. Lungs: Clear to auscultation bilaterally. No Wheezing or Rhonchi. No rales. Heart: Regular rate and rhythm, no murmur, rub or gallop. Abdomen: Soft, non-tender,not distended. Bowel sounds normal. No masses Extremities:    S/p I/D  Edema ankles  Skin: b/l shin scabs Lymph: Cervical, supraclavicular normal. Neurologic: Grossly non-focal Pertinent Labs Lab Results CBC    Component Value Date/Time   WBC 25.6 (H) 06/12/2019 0414   RBC 2.91 (L) 06/12/2019 0414   HGB 8.5 (L) 06/12/2019 0414   HCT 26.6 (L) 06/12/2019 0414   PLT 702 (H) 06/12/2019 0414   MCV 91.4 06/12/2019 0414   MCH 29.2 06/12/2019 0414   MCHC 32.0 06/12/2019 0414   RDW 12.4 06/12/2019 0414   LYMPHSABS 0.5 (L) 06/10/2019 1451   MONOABS 2.6 (H) 06/10/2019 1451   EOSABS 0.3 06/10/2019 1451   BASOSABS 0.1 06/10/2019 1451    CMP Latest Ref Rng & Units 06/12/2019 06/11/2019 06/10/2019  Glucose 70 - 99 mg/dL 135(H) 131(H) 256(H)  BUN 6 - 20 mg/dL 51(H) 51(H) 60(H)  Creatinine 0.61 - 1.24 mg/dL 1.74(H) 1.46(H) 1.68(H)  Sodium 135 - 145 mmol/L 137 134(L) 129(L)  Potassium 3.5 - 5.1 mmol/L 5.3(H) 5.2(H) 5.0  Chloride 98 - 111 mmol/L 111 108 98  CO2 22 - 32 mmol/L 17(L) 19(L) 22  Calcium 8.9 - 10.3 mg/dL 7.5(L) 7.5(L) 8.0(L)      Microbiology: Recent Results (from the past 240 hour(s))  Blood culture (routine x 2)     Status: None (Preliminary result)   Collection Time: 06/10/19  4:32 PM   Specimen: BLOOD  Result Value Ref Range Status   Specimen Description  BLOOD LEFT ANTECUBITAL  Final   Special Requests   Final    BOTTLES DRAWN AEROBIC AND ANAEROBIC Blood Culture results may not be optimal due to an inadequate volume of blood received in culture bottles   Culture   Final    NO GROWTH 2 DAYS Performed at [facility], [address] [place], [place] [address]    Report Status PENDING  Incomplete  Blood culture (routine x 2)      Status: None (Preliminary result)   Collection Time: 06/10/19  5:12 PM   Specimen: BLOOD  Result Value Ref Range Status   Specimen Description   Final    BLOOD RT FA Performed at [facility], [address], [place], [place] [address]    Special Requests   Final    BOTTLES DRAWN AEROBIC AND ANAEROBIC Blood Culture adequate volume Performed at [facility], [address] [place], [place] [address]    Culture  Setup Time   Final    Organism ID to follow [place] CRITICAL RESULT CALLED TO, READ BACK BY AND VERIFIED WITH: Rito Ehrlich 06/11/2019 1808 KMP Performed at [facility], [address], [place], [place] [address]    Culture   Final    Lonell Grandchild POSITIVE COCCI TOO YOUNG TO READ Performed at [facility], [place] [address], [place], [place] [address]    Report Status PENDING  Incomplete  SARS CORONAVIRUS 2 (TAT 6-24 HRS) Nasopharyngeal Nasopharyngeal Swab     Status: None   Collection Time: 06/10/19  5:12 PM   Specimen: Nasopharyngeal Swab  Result Value Ref Range Status   SARS Coronavirus 2 NEGATIVE NEGATIVE Final    Comment: (NOTE) SARS-CoV-2 target nucleic acids are NOT DETECTED. The SARS-CoV-2 RNA is generally detectable in upper and lower respiratory specimens during the acute phase of infection. Negative results do not preclude SARS-CoV-2 infection, do not rule out co-infections with other pathogens, and should not be used as the sole basis for treatment or other patient management decisions. Negative results must be combined with clinical observations, patient history, and epidemiological information. The expected result is Negative. Fact Sheet for Patients: SugarRoll.be Fact Sheet for Healthcare Providers: https://www.woods-mathews.com/ This test is not yet approved or cleared by the [place] FDA and  has been authorized for detection and/or diagnosis of  SARS-CoV-2 by FDA under an Emergency Use Authorization (EUA). This EUA will remain  in effect (meaning this test can be used) for the duration of the COVID-19 declaration under Section 56 4(b)(1) of the Act, 21 U.S.C. section 360bbb-3(b)(1), unless the authorization is terminated or revoked sooner. Performed at [facility], [place] [address] [place], [place] [address]   Blood Culture ID Panel (Reflexed)     Status: Abnormal   Collection Time: 06/10/19  5:12 PM  Result Value Ref Range Status   Enterococcus species NOT DETECTED NOT DETECTED Final   Listeria monocytogenes NOT DETECTED NOT DETECTED Final   Staphylococcus species DETECTED (A) NOT DETECTED Final    Comment: CRITICAL RESULT CALLED TO, READ BACK BY AND VERIFIED WITH: WALID NAZARI ON 06/11/2019 AT 1608 KMP    Staphylococcus aureus (BCID) DETECTED (A) NOT DETECTED Final    Comment: Methicillin (oxacillin)-resistant Staphylococcus aureus (MRSA). MRSA is predictably resistant to beta-lactam antibiotics (except ceftaroline). Preferred therapy is vancomycin unless clinically contraindicated. Patient requires contact precautions if  hospitalized. CRITICAL RESULT CALLED TO, READ BACK BY AND VERIFIED WITH: WALID NAZARI ON 06/11/2019 AT  [place] resistance DETECTED (A) NOT DETECTED Final    Comment: CRITICAL RESULT CALLED TO, READ BACK BY AND VERIFIED WITH: WALID NAZARI ON 06/11/2019 AT 1608 KMP    Streptococcus species NOT DETECTED NOT DETECTED Final   Streptococcus agalactiae NOT DETECTED NOT DETECTED Final   Streptococcus pneumoniae NOT DETECTED NOT DETECTED Final   Streptococcus pyogenes NOT DETECTED NOT DETECTED Final   Acinetobacter baumannii NOT DETECTED NOT DETECTED Final   Enterobacteriaceae species NOT DETECTED NOT DETECTED Final   Enterobacter cloacae complex NOT DETECTED NOT DETECTED Final   Escherichia coli NOT DETECTED NOT DETECTED Final   Klebsiella oxytoca NOT DETECTED NOT DETECTED Final    Klebsiella pneumoniae NOT DETECTED NOT DETECTED Final   Proteus species NOT DETECTED NOT DETECTED Final   Serratia marcescens NOT DETECTED NOT DETECTED Final   Haemophilus influenzae NOT DETECTED NOT DETECTED Final   Neisseria meningitidis NOT DETECTED NOT DETECTED Final   Pseudomonas aeruginosa NOT DETECTED NOT DETECTED Final   Candida albicans NOT DETECTED NOT DETECTED Final   Candida glabrata NOT DETECTED NOT DETECTED Final   Candida krusei NOT DETECTED NOT DETECTED Final   Candida parapsilosis NOT DETECTED NOT DETECTED Final   Candida tropicalis NOT DETECTED NOT DETECTED Final    Comment: Performed at [facility], [address] [place], [place] [address]  Surgical pcr screen     Status: Abnormal   Collection Time: 06/11/19  8:21 PM   Specimen: Nasal Mucosa; Nasal Swab  Result Value Ref Range Status   MRSA, PCR POSITIVE (A) NEGATIVE Final    Comment: RESULT CALLED TO, READ BACK BY AND VERIFIED WITH: Janeann Merl ON 06/11/2019 AT 2148 QSD    Staphylococcus aureus POSITIVE (A) NEGATIVE Final    Comment: (NOTE) The Xpert SA Assay (FDA approved for NASAL specimens in patients 56 years of age and older), is one component of a comprehensive surveillance program. It is not intended to diagnose infection nor to guide or monitor treatment. Performed at [facility], [address] [place], [place] [address]   Aerobic/Anaerobic Culture (surgical/deep wound)     Status: None (Preliminary result)   Collection Time: 06/11/19 10:30 PM   Specimen: PATH Other; Wound  Result Value Ref Range Status   Specimen Description   Final    HAND RIGHT DORSAL INCISION Performed at [facility], [address], [place], [place] [address]    Special Requests   Final    PATIENT ON FOLLOWING CEFEPIME VANCOMYCIN Performed at [facility], [place] [address], [place], [place] [address]    Gram Stain   Final    NO ORGANISMS SEEN RARE WBC MANY RBC Performed at [facility], [address] [place],  [address]    Culture PENDING  Incomplete   Report Status PENDING  Incomplete  Aerobic/Anaerobic Culture (surgical/deep wound)     Status: None (Preliminary result)   Collection Time: 06/11/19 10:33 PM   Specimen: PATH Other; Wound  Result Value Ref Range Status   Specimen Description   Final    HAND Performed at [facility], [address] [place],  [address]    Special Requests   Final    RIGHT HAND DORSAL INCISION 2 PATIENT ON FOLLOWING CEFEPIME VANCOMYCIN Performed at [facility], [place] [address], [place], [place] [address]    Gram Stain   Final    NO ORGANISMS SEEN MANY RBC FEW WBC Performed at [facility],  [place], [place] [address]    Culture PENDING  Incomplete   Report Status PENDING  Incomplete  Aerobic/Anaerobic Culture (surgical/deep wound)     Status: None (Preliminary result)   Collection Time: 06/11/19 10:53 PM   Specimen: PATH Other; Wound  Result Value Ref Range Status   Specimen Description   Final    HAND Performed at [facility], [address] [place], [place] [address]    Special Requests   Final    RIGHT HAND LOWER INCISION PATIENT ON FOLLOWING CEFEPIME VANCOMYCIN Performed at [facility], [place] [address], [place], [place] [address]    Gram Stain   Final    NO ORGANISMS SEEN FEW WBC MODERATE RBC Performed at [facility], [address] [place],  [address]    Culture PENDING  Incomplete   Report Status PENDING  Incomplete    IMAGING RESULTS: I have personally reviewed the films ? Impression/Recommendation ? 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 _AKI--watch closely as he  is on vancomycin  Anemia- unclear etiology- was low in June 2002. Prior to that was normal. With AKi need to r/o MM or other causes _  DM- says he is no longer on any meds Last Hba1c was 6.3 in June 2020 _______ __________________________________________ Discussed with patient in detail Note:  This document was prepared using Dragon voice recognition software and may include unintentional dictation errors.

## 2019-06-12 NOTE — TOC Initial Note (Signed)
Transition of Care [facility]) - Initial/Assessment Note    Patient Details  Name: Nathan [name] MRN: [id number] Date of Birth: 1961/07/26  [facility] CM/SW Contact:    Candie Chroman, LCSW Phone Number: 06/12/2019, 1:10 PM  Clinical Narrative: CSW met with patient. No supports at bedside. CSW introduced role. Patient confirmed he has no PCP or insurance. Patient did go to [facility] within the past few months to follow up regarding his diabetes. CSW provided free/low-cost healthcare booklet as well as intake paperwork for [facility] in case he decides to follow up there. Patient works and says he can afford to pay for medications although he is not taking any at this time. No further concerns. CSW encouraged patient to contact CSW as needed. CSW will continue to follow patient for support until discharge.                 Expected Discharge Plan: Home/Self Care Barriers to Discharge: Continued Medical Work up   Patient Goals and CMS Choice     Choice offered to / list presented to : NA  Expected Discharge Plan and Services Expected Discharge Plan: Home/Self Care       Living arrangements for the past 2 months: Single Family Home                                      Prior Living Arrangements/Services Living arrangements for the past 2 months: Single Family Home Lives with:: Spouse Patient language and need for interpreter reviewed:: Yes Do you feel safe going back to the place where you live?: Yes      Need for Family Participation in Patient Care: Yes (Comment) Care giver support system in place?: Yes (comment)   Criminal Activity/Legal Involvement Pertinent to Current Situation/Hospitalization: No - Comment as needed  Activities of Daily Living Home Assistive Devices/Equipment: CBG Meter ADL Screening (condition at time of admission) Patient's cognitive ability adequate to safely complete daily activities?: Yes Is the patient deaf or have  difficulty hearing?: No Does the patient have difficulty seeing, even when wearing glasses/contacts?: No Does the patient have difficulty concentrating, remembering, or making decisions?: No Patient able to express need for assistance with ADLs?: Yes Does the patient have difficulty dressing or bathing?: No Independently performs ADLs?: Yes (appropriate for developmental age) Does the patient have difficulty walking or climbing stairs?: Yes Weakness of Legs: Both Weakness of Arms/Hands: None  Permission Sought/Granted                  Emotional Assessment Appearance:: Appears stated age Attitude/Demeanor/Rapport: Engaged, Gracious Affect (typically observed): Accepting, Appropriate, Calm, Pleasant Orientation: : Oriented to Self, Oriented to Place, Oriented to  Time, Oriented to Situation Alcohol / Substance Use: Never Used Psych Involvement: No (comment)  Admission diagnosis:  Cellulitis of right upper extremity [L03.113] Patient Active Problem List   Diagnosis Date Noted  . Sepsis ([place]) 06/10/2019   PCP:  Patient, No Pcp Per Pharmacy:   [facility] [address] ([address]), [place]) [place] [address] Phone: [phone] Fax: [phone]     Social Determinants of Health (SDOH) Interventions    Readmission Risk Interventions No flowsheet data found.

## 2019-06-12 NOTE — Anesthesia Postprocedure Evaluation (Signed)
Anesthesia Post Note  Patient: Nathan [name]  Procedure(s) Performed: IRRIGATION AND DEBRIDEMENT RIGHT THUMB (Right )  Patient location during evaluation: PACU Anesthesia Type: General Level of consciousness: awake and alert Pain management: pain level controlled Vital Signs Assessment: post-procedure vital signs reviewed and stable Respiratory status: spontaneous breathing, nonlabored ventilation and respiratory function stable Cardiovascular status: blood pressure returned to baseline and stable Postop Assessment: no apparent nausea or vomiting Anesthetic complications: no     Last Vitals:  Vitals:   06/12/19 0041 06/12/19 0136  BP: 117/65 122/69  Pulse: 94 95  Resp: 18 20  Temp: 36.7 C 36.6 C  SpO2: 91% 93%    Last Pain:  Vitals:   06/12/19 0136  TempSrc: Axillary  PainSc:                  Durenda Hurt

## 2019-06-12 NOTE — Progress Notes (Signed)
      INFECTIOUS DISEASE ATTENDING ADDENDUM:   Date: 06/12/2019  Patient name: Nathan [name]  Medical record number: [id number]  Date of birth: [birth year]        [facility] Antimicrobial Management Team Staphylococcus aureus bacteremia   Staphylococcus aureus bacteremia (SAB) is associated with a high rate of complications and mortality.  Specific aspects of clinical management are critical to optimizing the outcome of patients with SAB.  Therefore, the [facility] Antimicrobial Management Team [facility]) has initiated an intervention aimed at improving the management of SAB at [facility].  To do so, Infectious Diseases physicians are providing an evidence-based consult for the management of all patients with SAB.     Yes No Comments  Perform follow-up blood cultures (even if the patient is afebrile) to ensure clearance of bacteremia [x]  []    Remove vascular catheter and obtain follow-up blood cultures after the removal of the catheter []  []  DO NOT PLACE CENTRAL LINE  Or if one is in place it will need to be removed and pt have catheter holiday  Perform echocardiography to evaluate for endocarditis (transthoracic ECHO is 40-50% sensitive, TEE is > 90% sensitive) [x]  []  Please keep in mind, that neither test can definitively EXCLUDE endocarditis, and that should clinical suspicion remain high for endocarditis the patient should then still be treated with an "endocarditis" duration of therapy = 6 weeks  Consult electrophysiologist to evaluate implanted cardiac device (pacemaker, ICD) []  []    Ensure source control []  []  Have all abscesses been drained effectively? Have deep seeded infections (septic joints or osteomyelitis) had appropriate surgical debridement?\  ABSCESS I and D by Orthopedics  Investigate for "metastatic" sites of infection [x]  []  Does the patient have ANY symptom or physical exam finding that would suggest a deeper infection (back or neck pain that may be suggestive of  vertebral osteomyelitis or epidural abscess, muscle pain that could be a symptom of pyomyositis)?  Keep in mind that for deep seeded infections MRI imaging with contrast is preferred rather than other often insensitive tests such as plain x-rays, especially early in a patient's presentation.  Change antibiotic therapy to vancomycin and DC cefepime []  []  Beta-lactam antibiotics are preferred for MSSA due to higher cure rates.   If on Vancomycin, goal trough should be 15 - 20 mcg/mL  Estimated duration of IV antibiotic therapy:  4- 6 weeks []  []  Consult case management for probably prolonged outpatient IV antibiotic therapy    Formal consult from Dr. Delaine Lame to follow.   [name] 06/12/2019, 8:52 AM

## 2019-06-12 NOTE — Progress Notes (Signed)
Subjective: 1 Day Post-Op Procedure(s) (LRB): IRRIGATION AND DEBRIDEMENT RIGHT THUMB (Right) Patient reports pain as mild to the right hand, feels like his hand has improved s/p I&D performed yesterday. Patient is well, has no acute complaints. Plan is to go Home after hospital stay. Negative for chest pain and shortness of breath Fever: no Gastrointestinal:Negative for nausea and vomiting  Objective: Vital signs in last 24 hours: Temp:  [97.6 F (36.4 C)-98.6 F (37 C)] 97.6 F (36.4 C) (09/22 0400) Pulse Rate:  [92-101] 95 (09/22 0400) Resp:  [16-21] 16 (09/22 0400) BP: (116-161)/(64-88) 121/72 (09/22 0400) SpO2:  [91 %-99 %] 94 % (09/22 0400)  Intake/Output from previous day:  Intake/Output Summary (Last 24 hours) at 06/12/2019 1212 Last data filed at 06/12/2019 0300 Gross per 24 hour  Intake 2237.51 ml  Output 0 ml  Net 2237.51 ml    Intake/Output this shift: No intake/output data recorded.  Labs: Recent Labs    06/10/19 1451 06/11/19 0420 06/12/19 0414  HGB 10.1* 9.0* 8.5*   Recent Labs    06/11/19 0420 06/12/19 0414  WBC 23.3* 25.6*  RBC 3.08* 2.91*  HCT 27.2* 26.6*  PLT 686* 702*   Recent Labs    06/11/19 0420 06/12/19 0414  NA 134* 137  K 5.2* 5.3*  CL 108 111  CO2 19* 17*  BUN 51* 51*  CREATININE 1.46* 1.74*  GLUCOSE 131* 135*  CALCIUM 7.5* 7.5*   Recent Labs    06/11/19 0420  INR 1.3*     EXAM General - Patient is Alert, Appropriate and Oriented Extremity - Bulky dressing intact to the right hand this morning.  Intact to light touch over the distal aspect of each finger and thumb.  Cap refill intact to each digit.  Able to flex and extend all fingers. Dressing/Incision - Bulky dressing intact without any acute drainage.  Past Medical History:  Diagnosis Date  . Diabetes mellitus without complication (HCC)     Assessment/Plan: 1 Day Post-Op Procedure(s) (LRB): IRRIGATION AND DEBRIDEMENT RIGHT THUMB (Right) Active Problems:  Sepsis ([place]) Anemia due to acute blood loss  Estimated body mass index is 22.15 kg/m as calculated from the following:   Height as of this encounter: 5\' 9"  (1.753 m).   Weight as of this encounter: 68 kg. Advance diet Up with therapy D/C IV fluids when tolerating po intake.  Labs reviewed, WBC 25.6 this morning.  CBC ordered for tomorrow morning. K+ 5.3, internal medicine managing. Hg 8.5 this AM. Cultures obtained during surgery have not returned, no current growth on gram stain. Currently on IV Vancomycin Plan for dressing change tomorrow morning.  DVT Prophylaxis - heparin Non-weightbearing to the right hand.  Raquel Dymon [place], PA-C [facility] 06/12/2019, 12:12 PM

## 2019-06-13 ENCOUNTER — Inpatient Hospital Stay
Admit: 2019-06-13 | Discharge: 2019-06-13 | Disposition: A | Discharge status: Home / Self Care | Payer: Self-pay | Attending: Infectious Diseases | Admitting: Infectious Diseases

## 2019-06-13 DIAGNOSIS — I10 Essential (primary) hypertension: Secondary | ICD-10-CM

## 2019-06-13 DIAGNOSIS — I361 Nonrheumatic tricuspid (valve) insufficiency: Secondary | ICD-10-CM

## 2019-06-13 DIAGNOSIS — I480 Paroxysmal atrial fibrillation: Secondary | ICD-10-CM

## 2019-06-13 LAB — IRON AND TIBC
Iron: 25 ug/dL — ABNORMAL LOW (ref 45–182)
Saturation Ratios: 19 % (ref 17.9–39.5)
TIBC: 129 ug/dL — ABNORMAL LOW (ref 250–450)
UIBC: 104 ug/dL

## 2019-06-13 LAB — BASIC METABOLIC PANEL
Anion gap: 8 (ref 5–15)
BUN: 63 mg/dL — ABNORMAL HIGH (ref 6–20)
CO2: 17 mmol/L — ABNORMAL LOW (ref 22–32)
Calcium: 7.7 mg/dL — ABNORMAL LOW (ref 8.9–10.3)
Chloride: 110 mmol/L (ref 98–111)
Creatinine, Ser: 1.94 mg/dL — ABNORMAL HIGH (ref 0.61–1.24)
GFR calc Af Amer: 44 mL/min — ABNORMAL LOW (ref >60)
GFR calc non Af Amer: 38 mL/min — ABNORMAL LOW (ref >60)
Glucose, Bld: 244 mg/dL — ABNORMAL HIGH (ref 70–99)
Potassium: 4.7 mmol/L (ref 3.5–5.1)
Sodium: 135 mmol/L (ref 135–145)

## 2019-06-13 LAB — GLUCOSE, CAPILLARY
Glucose-Capillary: 214 mg/dL — ABNORMAL HIGH (ref 70–99)
Glucose-Capillary: 180 mg/dL — ABNORMAL HIGH (ref 70–99)
Glucose-Capillary: 178 mg/dL — ABNORMAL HIGH (ref 70–99)
Glucose-Capillary: 215 mg/dL — ABNORMAL HIGH (ref 70–99)

## 2019-06-13 LAB — ECHOCARDIOGRAM COMPLETE
Height: 69 in
Weight: 2400 oz

## 2019-06-13 LAB — LIPID PANEL
Cholesterol: 122 mg/dL (ref 0–200)
HDL: 21 mg/dL — ABNORMAL LOW (ref >40)
LDL Cholesterol: 71 mg/dL (ref 0–99)
Total CHOL/HDL Ratio: 5.8 RATIO
Triglycerides: 152 mg/dL — ABNORMAL HIGH (ref <150)
VLDL: 30 mg/dL (ref 0–40)

## 2019-06-13 LAB — CBC
HCT: 24.3 % — ABNORMAL LOW (ref 39.0–52.0)
Hemoglobin: 7.9 g/dL — ABNORMAL LOW (ref 13.0–17.0)
MCH: 29.7 pg (ref 26.0–34.0)
MCHC: 32.5 g/dL (ref 30.0–36.0)
MCV: 91.4 fL (ref 80.0–100.0)
Platelets: 677 10*3/uL — ABNORMAL HIGH (ref 150–400)
RBC: 2.66 MIL/uL — ABNORMAL LOW (ref 4.22–5.81)
RDW: 12.6 % (ref 11.5–15.5)
WBC: 20 10*3/uL — ABNORMAL HIGH (ref 4.0–10.5)
nRBC: 0 % (ref 0.0–0.2)

## 2019-06-13 LAB — SEDIMENTATION RATE: Sed Rate: 108 mm/hr — ABNORMAL HIGH (ref 0–20)

## 2019-06-13 LAB — FERRITIN: Ferritin: 617 ng/mL — ABNORMAL HIGH (ref 24–336)

## 2019-06-13 LAB — TSH: TSH: 2.826 u[IU]/mL (ref 0.350–4.500)

## 2019-06-13 LAB — FOLATE: Folate: 8.4 ng/mL (ref >5.9)

## 2019-06-13 LAB — VITAMIN B12: Vitamin B-12: 683 pg/mL (ref 180–914)

## 2019-06-13 MED ORDER — HEPARIN SODIUM (PORCINE) 5000 UNIT/ML IJ SOLN
5000.0000 [IU] | Freq: Three times a day (TID) | INTRAMUSCULAR | Status: DC
  Administered 2019-06-13 – 2019-06-16 (×7): 5000 [IU] via SUBCUTANEOUS
  Filled 2019-06-13 (×7): qty 1

## 2019-06-13 MED ORDER — METOPROLOL TARTRATE 25 MG PO TABS
25.0000 mg | ORAL_TABLET | Freq: Four times a day (QID) | ORAL | Status: DC
  Administered 2019-06-13 – 2019-06-16 (×10): 25 mg via ORAL
  Filled 2019-06-13 (×11): qty 1

## 2019-06-13 MED ORDER — APIXABAN 5 MG PO TABS: 5.0000 mg | ORAL_TABLET | Freq: Two times a day (BID) | ORAL | Status: DC

## 2019-06-13 MED ORDER — DILTIAZEM HCL-DEXTROSE 100-5 MG/100ML-% IV SOLN (PREMIX)
5.0000 mg/h | INTRAVENOUS | Status: DC
  Filled 2019-06-13: qty 100

## 2019-06-13 MED ORDER — VANCOMYCIN VARIABLE DOSE PER UNSTABLE RENAL FUNCTION (PHARMACIST DOSING): Status: DC

## 2019-06-13 MED ORDER — DILTIAZEM HCL 25 MG/5ML IV SOLN
20.0000 mg | Freq: Once | INTRAVENOUS | Status: AC
  Administered 2019-06-13: 10:00:00 20 mg via INTRAVENOUS
  Filled 2019-06-13: qty 5

## 2019-06-13 MED ORDER — VANCOMYCIN HCL IN DEXTROSE 1-5 GM/200ML-% IV SOLN
1000.0000 mg | INTRAVENOUS | Status: DC
  Administered 2019-06-13: 18:00:00 1000 mg via INTRAVENOUS
  Filled 2019-06-13: qty 200

## 2019-06-13 MED ORDER — DILTIAZEM HCL ER 60 MG PO CP12
120.0000 mg | ORAL_CAPSULE | Freq: Two times a day (BID) | ORAL | Status: DC
  Administered 2019-06-13: 120 mg via ORAL
  Filled 2019-06-13 (×2): qty 2

## 2019-06-13 NOTE — Progress Notes (Addendum)
[facility] [place] NAME: Trung Ardito    MR#:  [id number]  DATE OF BIRTH:  09/23/1960  SUBJECTIVE:  CHIEF COMPLAINT:   Chief Complaint  Patient presents with  . Wound Infection  Patient seen and evaluated today Patient had palpitations this morning Heart rate around 170 bpm He was given IV Cardizem push 20 mg but heart rate still persists around 160 bpm Patient appears to be in A. Fib Bandage to right hand changed Status post incision and drainage of the right hand abscess No fever  REVIEW OF SYSTEMS:    ROS  CONSTITUTIONAL: No documented fever. No fatigue, weakness. No weight gain, no weight loss.  EYES: No blurry or double vision.  ENT: No tinnitus. No postnasal drip. No redness of the oropharynx.  RESPIRATORY: No cough, no wheeze, no hemoptysis. No dyspnea.  CARDIOVASCULAR: No chest pain. No orthopnea. Has palpitations. No syncope.  GASTROINTESTINAL: No nausea, no vomiting or diarrhea. No abdominal pain. No melena or hematochezia.  GENITOURINARY: No dysuria or hematuria.  ENDOCRINE: No polyuria or nocturia. No heat or cold intolerance.  HEMATOLOGY: No anemia. No bruising. No bleeding.  INTEGUMENTARY: No rashes. No lesions.  MUSCULOSKELETAL: No arthritis.  No gout.  Right hand bandage noted NEUROLOGIC: No numbness, tingling, or ataxia. No seizure-type activity.  PSYCHIATRIC: No anxiety. No insomnia. No ADD.   DRUG ALLERGIES:  No Known Allergies  VITALS:  Blood pressure 127/73, pulse 97, temperature 98.2 F (36.8 C), resp. rate 18, height 5\' 9"  (1.753 m), weight 68 kg, SpO2 92 %.  PHYSICAL EXAMINATION:   Physical Exam  GENERAL:  58 y.o.-year-old patient lying in the bed with no acute distress.  EYES: Pupils equal, round, reactive to light and accommodation. No scleral icterus. Extraocular muscles intact.  HEENT: Head atraumatic, normocephalic. Oropharynx and nasopharynx clear.  NECK:  Supple, no jugular venous  distention. No thyroid enlargement, no tenderness.  LUNGS: Normal breath sounds bilaterally, no wheezing, rales, rhonchi. No use of accessory muscles of respiration.  CARDIOVASCULAR: S1, S2 irregular. No murmurs, rubs, or gallops.  ABDOMEN: Soft, nontender, nondistended. Bowel sounds present. No organomegaly or mass.  EXTREMITIES: No cyanosis, clubbing or edema b/l.   Right hand bandage noted NEUROLOGIC: Cranial nerves II through XII are intact. No focal Motor or sensory deficits b/l.   PSYCHIATRIC: The patient is alert and oriented x 3.  SKIN: Wound noted in the right thumb area with redness of the skin and discharge     LABORATORY PANEL:   CBC Recent Labs  Lab 06/13/19 0400  WBC 20.0*  HGB 7.9*  HCT 24.3*  PLT 677*   ------------------------------------------------------------------------------------------------------------------ Chemistries  Recent Labs  Lab 06/11/19 0420  06/13/19 0400  NA 134*   < > 135  K 5.2*   < > 4.7  CL 108   < > 110  CO2 19*   < > 17*  GLUCOSE 131*   < > 244*  BUN 51*   < > 63*  CREATININE 1.46*   < > 1.94*  CALCIUM 7.5*   < > 7.7*  MG 2.5*  --   --    < > = values in this interval not displayed.   ------------------------------------------------------------------------------------------------------------------  Cardiac Enzymes No results for input(s): TROPONINI in the last 168 hours. ------------------------------------------------------------------------------------------------------------------  RADIOLOGY:  No results found.   ASSESSMENT AND PLAN:    Patient is a 58 year old male with history of hypertension and diabetes mellitus admitted for management of sepsis secondary  to right thumb infection and currently under hospitalist service.  1.  Sepsis secondary to right thumb infection improving Secondary to staph aureus bacteremia Antibiotics narrowed down to IV vancomycin Cefepime discontinued Appreciate ID follow-up IV  fluid hydration.  2.  Atrial fibrillation with rapid rate Transfer patient to telemetry IV Cardizem drip for rate control Cardiology consult Check echocardiogram Anticoagulation we will start eliquis  3.  Right thumb and hand abscess Status post incision and drainage by surgery Continue IV vancomycin antibiotic CT of the hand revealed abscess  4.  Hypertension Medical management to continue  5.  Diabetes mellitus type 2 Placed on sliding scale insulin coverage.  Glycosylated hemoglobin level in a.m.  6.  Hyperkalemia Resolved with [place]  All the records are reviewed and case discussed with ED provider. Management plans discussed with the patient, family and they are in agreement.  CODE STATUS: Full code All the records are reviewed and case discussed with Care Management/Social Worker. Management plans discussed with the patient, family and they are in agreement.  CODE STATUS: Full code  DVT Prophylaxis: SCDs  TOTAL CRITICAL CARE TIME TAKING CARE OF THIS PATIENT: 52 minutes.   POSSIBLE D/C IN 2 to 3 DAYS, DEPENDING ON CLINICAL CONDITION.  Saundra Shelling M.D on 06/13/2019 at 10:10 AM  Between 7am to 6pm - Pager - [phone]  After 6pm go to [email or web address] - password [email or web address] [place] [facility]  Office  [phone]  CC: Primary care physician; Patient, No Pcp Per  Note: This dictation was prepared with Dragon dictation along with smaller phrase technology. Any transcriptional errors that result from this process are unintentional.

## 2019-06-13 NOTE — Consult Note (Signed)
Pharmacy Antibiotic Note  Nathan [name] is a 58 y.o. male admitted on 06/10/2019 with MRSA bacteremia secondary to the abscess in his right hand.  Pharmacy has been consulted for vancomycin dosing. He was originally started on cefepime also but therapy has been narrowed to target the bacteria source. This is day #3 of antibiotics, leukocytosis improved but still elevated, SCr continues to trend higher away from his baseline level    Plan: 09/23 @ 0400 Scr 1.94 (1.46 >> 1.74 >> 1.94)  per Dr. Delaine Lame concerned about patient's continuing rising Scr, therefore recommended to hold scheduled regimen, and will check a random level w/ am labs w/ BMP. Patient is post vanc 1g IV dose @ 1744. Will continue to monitor and adjust as appropriate.  Height: 5\' 9"  (175.3 cm) Weight: 150 lb (68 kg) IBW/kg (Calculated) : 70.7  Temp (24hrs), Avg:97.8 F (36.6 C), Min:97.4 F (36.3 C), Max:98.2 F (36.8 C)  Recent Labs  Lab 06/10/19 1451 06/10/19 1632 06/10/19 2027 06/10/19 2317 06/11/19 0420 06/12/19 0414 06/13/19 0400  WBC 29.4*  --   --   --  23.3* 25.6* 20.0*  CREATININE 1.68*  --   --   --  1.46* 1.74* 1.94*  LATICACIDVEN  --  1.0 1.7 0.9  --   --   --     Estimated Creatinine Clearance: 40.9 mL/min (A) (by C-G formula based on SCr of 1.94 mg/dL (H)).    No Known Allergies  Antimicrobials this admission: cefepime 9/20 >> 9/22 vancomycin 9/20 >>   Dose adjustments this admission: 9/23 vancomycin 1250 mg q24h--> 1000 mg q24h  Microbiology results: 9/22 BCx: NGTD 9/21 WCx pending 9/20 BCx: 1/4 MRSA  9/20 SARS CoV-2: negative  9/20 MRSA PCR: positive  Thank you for allowing pharmacy to be a part of this patient's care.  Tobie Lords, PharmD 06/13/2019 9:42 PM

## 2019-06-13 NOTE — Consult Note (Signed)
Cardiology Consultation:   Patient ID: Nathan [name] MRN: [id number]; DOB: [birth year]  Admit date: 06/10/2019 Date of Consult: 06/13/2019  Primary Care Provider: Patient, No Pcp Per Primary Cardiologist:New, Dr. Garen Lah rounding Primary Electrophysiologist:  None    Patient Profile:   Nathan [name] is a 58 y.o. male with a hx of DM 2, hypertension, remote history of smoking, and self-reported PAF previously on ASA (discontinued for headache) who is being seen today for the evaluation of atrial fibrillation at the request of Dr. Estanislado Pandy.  History of Present Illness:   Nathan [name] is a 58 year old male with PMH as above.  He has a remote history of smoking and stated he does not drink or use any illegal drugs.    He reportedly has a history of symptomatic atrial fibrillation dating back to the early 2000s, during which time the recommendation was for him to start daily ASA 81 mg (CHA2DS2VASc score of 1 at that time).  When in atrial fibrillation, he reports feeling palpitations and racing heart rate.  He also occasionally notes that he is short of breath, especially with exertion.    When first diagnosed with Afib, he stated that he was compliant with the recommendation for daily ASA. However, the aspirin started to give him a headache, at which time he stopped it.  He stated that he has continued to have episodes of symptomatic PAF since that time, often triggered by "changes," such as starting an antibiotic.  He reported these episodes occur quite frequently but do not last for long.   Later, when diagnosed with diabetes, his physician recommended that he go on long-term anticoagulation, due to an increased CHA2DS2VASc score of at least  2; however, as he does not have any insurance, he did not start [place].  He also noted that he works in heating and air and climbs ladders, often scuffing his shins and bleeding for extended periods of time; therefore, he did not feel that being on  a blood thinner would be safe for him.  Most recently, the patient was admitted to [facility] for a right thumb infection and sepsis, requiring IV abx and debridement.  He was initially seen in urgent care and prescribed oral antibiotic; however, after failing therapy, he was sent to the hospital.  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  He did not have very many symptoms with the infection; therefore, he did not feel that it was bad enough to necessitate his presenting to the emergency department.  When the edema and purulent drainage became worse, he presented to the ED.  In the emergency department, his blood pressure was elevated at 153/82, HR 96 bpm. WBC 29K.  He was started on IV antibiotics.  CT scan revealed extensive abscess.  He has been seen by Dr. Marry Guan and infectious disease.  Today, the patient denies any symptoms of chest pain, palpitations, or racing heart rate.  He denies shortness of breath or dyspnea on exertion.  No report of symptoms consistent with heart failure.  Echo is pending.  He reports that he has not seen a cardiologist in the past.  He continues to be apprehensive regarding [place], given that he does not have insurance and often scrapes himself at work. Per telemetry, the patient is currently in SR with previous short episodes of atrial fibrillation noted on telemetry with rates into the high 150s. He is in and out of  Afib. EKG as below.   Heart Pathway Score:     Past Medical History:  Diagnosis Date  . Diabetes mellitus without complication [facility])     Past Surgical History:  Procedure Laterality Date  . I&D EXTREMITY Right 06/11/2019   Procedure: IRRIGATION AND DEBRIDEMENT RIGHT THUMB;  Surgeon: Dereck Leep, MD;  Location: [facility] ORS;  Service: Orthopedics;  Laterality: Right;     Home Medications:  Prior to Admission medications   Medication Sig Start Date End Date Taking?  Authorizing Provider  amoxicillin-clavulanate (AUGMENTIN) 875-125 MG tablet Take 1 tablet by mouth 2 (two) times daily with a meal. 06/05/19  Yes [provider]  hydrochlorothiazide (HYDRODIURIL) 12.5 MG tablet Take 12.5 mg by mouth daily. 02/27/19  Yes [provider]  losartan (COZAAR) 25 MG tablet Take 25 mg by mouth daily. 02/27/19  Yes [provider]    Inpatient Medications: Scheduled Meds: . diltiazem  120 mg Oral Q12H  . heparin  5,000 Units Subcutaneous Q8H  . influenza vac split quadrivalent PF  0.5 mL Intramuscular Tomorrow-1000  . insulin aspart  0-5 Units Subcutaneous QHS  . insulin aspart  0-9 Units Subcutaneous TID WC  . mupirocin ointment  1 application Nasal BID  . pantoprazole  40 mg Oral BID  . senna  1 tablet Oral BID   Continuous Infusions: . sodium chloride Stopped (06/13/19 0130)  . vancomycin     PRN Meds: acetaminophen, bisacodyl, HYDROmorphone (DILAUDID) injection, magnesium citrate, magnesium hydroxide, oxyCODONE, oxyCODONE, phenol  Allergies:   No Known Allergies  Social History:   Social History   Socioeconomic History  . Marital status: Married    Spouse name: Not on file  . Number of children: Not on file  . Years of education: Not on file  . Highest education level: Not on file  Occupational History  . Not on file  Social Needs  . Financial resource strain: Not on file  . Food insecurity    Worry: Not on file    Inability: Not on file  . Transportation needs    Medical: Not on file    Non-medical: Not on file  Tobacco Use  . Smoking status: Never Smoker  . Smokeless tobacco: Never Used  Substance and Sexual Activity  . Alcohol use: Never    Frequency: Never  . Drug use: Never  . Sexual activity: Not on file  Lifestyle  . Physical activity    Days per week: Not on file    Minutes per session: Not on file  . Stress: Not on file  Relationships  . Social Herbalist on phone: Not on file    Gets  together: Not on file    Attends religious service: Not on file    Active member of club or organization: Not on file    Attends meetings of clubs or organizations: Not on file    Relationship status: Not on file  . Intimate partner violence    Fear of current or ex partner: Not on file    Emotionally abused: Not on file    Physically abused: Not on file    Forced sexual activity: Not on file  Other Topics Concern  . Not on file  Social History Narrative  . Not on file    Family History:   History reviewed. No pertinent family history.  No known family history of cardiac disease or arrhythmia. ROS:  Please see the history of present illness.  No  current symptoms.  Previous pertinent positive symptoms as above and included palpitations, racing heart rate, occasional feelings of SOB/DOE when in atrial fibrillation..  All other ROS reviewed and negative.     Physical Exam/Data:   Vitals:   06/12/19 0400 06/12/19 1524 06/12/19 1947 06/13/19 0453  BP: 121/72 125/66 (!) 141/76 127/73  Pulse: 95 94 97 97  Resp: 16 18 16 18   Temp: 97.6 F (36.4 C) 99 F (37.2 C) 98.1 F (36.7 C) 98.2 F (36.8 C)  TempSrc: Oral Oral Oral   SpO2: 94% 92% 92% 92%  Weight:      Height:       No intake or output data in the 24 hours ending 06/13/19 1414 Last 3 Weights 06/10/2019  Weight (lbs) 150 lb  Weight (kg) 68.04 kg     Body mass index is 22.15 kg/m.  General:  Well nourished, well developed, in no acute distress.  Sitting in the recliner next to the bed. HEENT: normal Neck: no JVD Vascular: No carotid bruits; radial pulses 2+ bilaterally Cardiac:  normal S1, S2; RRR; no murmur (sinus on my exam) Lungs: Coarse breath sounds bilaterally, decreased breath sounds at the left lobe base Abd: soft, nontender, no hepatomegaly  Ext: no edema. Bilateral scabs from where the patient reportedly nicked himself with his ladder.  Musculoskeletal:  No deformities, BUE and BLE strength normal and equal  Skin: warm and dry  Neuro:  No focal abnormalities noted Psych:  Normal affect   EKG:  The EKG was personally reviewed and demonstrates:  Atrial fibrillation with rapid ventricular response at ventricular rate of 137 bpm --Repeat EKG ordered today by IM after patient was noted to be back in sinus rhythm; however, this EKG is not available to me. Telemetry:  Telemetry was personally reviewed and demonstrates:  SR with short but frequent episodes of Afib with RVR. Currently SR at time of my exam.  Relevant CV Studies:  Echo 06/13/2019 1. Left ventricular ejection fraction, by visual estimation, is 45 to 50%. The left ventricle has mildly decreased function. Normal left ventricular size. There is no left ventricular hypertrophy.  2. Global right ventricle has normal systolic function.The right ventricular size is normal. No increase in right ventricular wall thickness.  3. Left atrial size was normal.  4. Right atrial size was normal.  5. The mitral valve is normal in structure. Mild mitral valve regurgitation. No evidence of mitral stenosis.  6. The tricuspid valve is normal in structure. Tricuspid valve regurgitation mild-moderate.  7. The aortic valve is normal in structure. Aortic valve regurgitation was not visualized by color flow Doppler. Structurally normal aortic valve, with no evidence of sclerosis or stenosis.  8. The pulmonic valve was normal in structure. Pulmonic valve regurgitation is not visualized by color flow Doppler.  9. Mildly elevated pulmonary artery systolic pressure. 10. The inferior vena cava is normal in size with greater than 50% respiratory variability, suggesting right atrial pressure of 3 mmHg.  Laboratory Data:  High Sensitivity Troponin:  No results for input(s): TROPONINIHS in the last 720 hours.   Cardiac EnzymesNo results for input(s): TROPONINI in the last 168 hours. No results for input(s): TROPIPOC in the last 168 hours.  Chemistry Recent Labs  Lab  06/11/19 0420 06/12/19 0414 06/13/19 0400  NA 134* 137 135  K 5.2* 5.3* 4.7  CL 108 111 110  CO2 19* 17* 17*  GLUCOSE 131* 135* 244*  BUN 51* 51* 63*  CREATININE 1.46* 1.74* 1.94*  CALCIUM 7.5* 7.5* 7.7*  GFRNONAA 53* 43* 38*  GFRAA >60 50* 44*  ANIONGAP 7 9 8     No results for input(s): PROT, ALBUMIN, AST, ALT, ALKPHOS, BILITOT in the last 168 hours. Hematology Recent Labs  Lab 06/11/19 0420 06/12/19 0414 06/13/19 0400  WBC 23.3* 25.6* 20.0*  RBC 3.08* 2.91* 2.66*  HGB 9.0* 8.5* 7.9*  HCT 27.2* 26.6* 24.3*  MCV 88.3 91.4 91.4  MCH 29.2 29.2 29.7  MCHC 33.1 32.0 32.5  RDW 11.9 12.4 12.6  PLT 686* 702* 677*   BNPNo results for input(s): BNP, PROBNP in the last 168 hours.  DDimer No results for input(s): DDIMER in the last 168 hours.   Radiology/Studies:  Ct Hand Right W Contrast  Result Date: 06/11/2019 CLINICAL DATA:  Sepsis. Right hand swelling. EXAM: CT OF THE UPPER RIGHT EXTREMITY WITH CONTRAST TECHNIQUE: Multidetector CT imaging of the upper right extremity was performed according to the standard protocol following intravenous contrast administration. COMPARISON:  Radiographs dated 06/10/2019 CONTRAST:  162mL OMNIPAQUE IOHEXOL 300 MG/ML  SOLN FINDINGS: Muscles and Tendons and soft tissues There is an extensive abscess extending from the dorsal aspect of the base of the thumb at the site of the soft tissue ulcer near the base of the proximal phalanx. The abscess extends into the palm involving the abductor pollicis brevis and adductor pollicis muscles extending across the palm extending to the volar surface of the head of the third metacarpal best seen on image 85 of series 6. The abscess is at least 6.3 x 3.4 x 2.3 cm. The mass is deep to the flexor tendons of first, second and third digits at the level of the metacarpals. Bones/Joint/Cartilage No evidence of osteomyelitis or other acute abnormality. IMPRESSION: 1. Extensive abscess in the palm extending from the dorsal  aspect of the base of the thumb to the volar surface of the head of the third metacarpal. 2. No evidence of osteomyelitis. Electronically Signed   By: Lorriane Shire M.D.   On: 06/11/2019 10:15   Dg Hand Complete Right  Result Date: 06/10/2019 CLINICAL DATA:  Infection in right thumb EXAM: RIGHT HAND - COMPLETE 3+ VIEW COMPARISON:  None. FINDINGS: No fracture or dislocation of the right hand. Joint spaces are well preserved. Soft tissue edema about the right thumb. IMPRESSION: Soft tissue edema about the right thumb. No fracture, dislocation, or other osseous abnormality. Electronically Signed   By: Eddie Candle M.D.   On: 06/10/2019 17:14    Assessment and Plan:   Paroxysmal Atrial fibrillation with RVR --Not new Afib. Per patient, he was previously diagnosed in early 2000s.  At that time, CHA2DS2VASc score was only 1 (HTN) with recommendation for ASA 81 mg daily.  Patient reportedly stopped this due to headaches.  Since that time, patient believes he has been in and out of atrial fibrillation quite frequently, as he is symptomatic when in atrial fibrillation with racing heart rate and palpitations, as well as intermittent shortness of breath/DOE.  He feels that his PAF is often triggered by stressors and new medications as above. --Continue IV heparin.  Plan for [facility] at discharge as below with Eliquis 5mg  BID. Of note, in the future, if patient's weight drops below 60kg, he will then meet criteria for reduced dosing with Eliquis 2.5mg  BID, given he also has a Cr above 1.5 (Cr 1.94).   --CHA2DS2VASc score of at least 2 (HTN, DM2) with recommendation for long-term anticoagulation.  This was discussed at length with the patient  with patient agreeable to transition to [facility] at discharge.  --9/22 Echo as above with EF slightly reduced 45-50%. Discontinued Cardizem, which is not recommended in patients with reduced EF.  Given reduced EF as above, consider discharge with ZIO to assess burden of atrial  fibrillation.  As an outpatient, may need to consider further ischemic work-up given the reduced EF. --Continue rate control with Lopressor 25 mg every 6 hours.  Titrate as needed for heart rate control and as BP allows.  Will plan for consolidation prior to discharge, likely with Toprol XL 100 mg with official dosing to be determined before discharge.  --TSH pending. Ordered AM BMET for tomorrow to ensure monitoring electrolytes in addition to renal function.  --Current plans this admission for DCCV +/- TEE, given patient is successfully rate controlled and in and out of atrial fibrillation this admission.    HFrEF, mild pulmonary hypertension --Patient euvolemic on exam.  --9/22 echo as above with EF slightly reduced 40 to 50%.  PASP also mildly elevated. --As above, consider ZIO at discharge to assess burden of EF.  --Patient may also benefit from further outpatient ischemic work-up at that time given reduced EF and risk factors for CAD, including past history of smoking and HTN.   --Will order lipid panel to further risk stratify. --Do not recommend IV diuresis at this time as patient euvolemic with rising creatinine as below. --Continue metoprolol 25 mg every 6 hours and titrate as needed. BB to be consolidated at discharge as above. --Before discharge, and if renal function allows, recommend addition of ACE/ARB given reduced EF with comorbid conditions of hypertension and diabetes (as recommended per guidelines). Daily BMET recommended.   Mild to moderate TR --Will continue to monitor with periodic echocardiogram and/or worsening of symptoms, such as SOB.  BP and heart rate control recommended at this time.   HTN --Suboptimally controlled with SBP into the 140s.  --Titrate metoprolol for optimal HR and BP control.   --Given reduced EF, Cardizem/ diltiazem not recommended as above.  --If renal function allows, consider addition of ACE/ARB per guidelines and prior to discharge given  comorbid DM2/HTN with reduced EF.   AKI --Daily BMET. Cr 1.68  1.46  1.94.  Will defer start of ACE/ARB as above with plan for start prior to discharge if renal function allows.  Renally dose medications.  Caution with contrast procedures.  DM2 --SSI, per IM.    For questions or updates, please contact [place] Please consult [email or web address] for contact info under     Signed, Arvil Chaco, PA-C  06/13/2019 2:14 PM

## 2019-06-13 NOTE — Progress Notes (Signed)
*  PRELIMINARY RESULTS* Echocardiogram 2D Echocardiogram has been performed.  Nathan [name] 06/13/2019, 11:42 AM

## 2019-06-13 NOTE — Plan of Care (Signed)
Got a call from central tele saying pt's HR was in 180s.  Went to check on pt and he was eating breakfast. Stated he was feeling ok.  Said he sometimes goes into Afib.  Dr. Estanislado Pandy was rounding on pt and was informed.  He ordered 12 lead EKG and 20mg  cardizem IV push.  Both orders completed.  Pt is still having irregular HR in the 140s-160s.  Dr. Estanislado Pandy was notified and he's putting in an order to transfer to telemetry floor.

## 2019-06-13 NOTE — Progress Notes (Addendum)
   Date of Admission:  06/10/2019     Subjective: Pt says he is swollen and his legs are tight No fever or chills  Medications:  . diltiazem  120 mg Oral Q12H  . heparin  5,000 Units Subcutaneous Q8H  . influenza vac split quadrivalent PF  0.5 mL Intramuscular Tomorrow-1000  . insulin aspart  0-5 Units Subcutaneous QHS  . insulin aspart  0-9 Units Subcutaneous TID WC  . mupirocin ointment  1 application Nasal BID  . pantoprazole  40 mg Oral BID  . senna  1 tablet Oral BID    Objective: Vital signs in last 24 hours: Temp:  [98.1 F (36.7 C)-99 F (37.2 C)] 98.2 F (36.8 C) (09/23 0453) Pulse Rate:  [94-97] 97 (09/23 0453) Resp:  [16-18] 18 (09/23 0453) BP: (125-141)/(66-76) 127/73 (09/23 0453) SpO2:  [92 %] 92 % (09/23 0453)  PHYSICAL EXAM:  General: Alert, cooperative, no distress, appears stated age.  Head: Normocephalic, without obvious abnormality, atraumatic. Eyes: Conjunctivae clear, anicteric sclerae. Pupils are equal ENT Nares normal. No drainage or sinus tenderness. Lips, mucosa, and tongue normal. No Thrush Neck: Supple, symmetrical, no adenopathy, thyroid: non tender no carotid bruit and no JVD. Back: No CVA tenderness. Lungs: Clear to auscultation bilaterally. No Wheezing or Rhonchi. No rales. Heart: Regular rate and rhythm, no murmur, rub or gallop. Abdomen: Soft, non-tender,not distended. Bowel sounds normal. No masses Extremities:rt hand surgical dressing not removed B/l shin scabs Edema ankles Skin:as above Lymph: Cervical, supraclavicular normal. Neurologic: Grossly non-focal  Lab Results Recent Labs    06/12/19 0414 06/13/19 0400  WBC 25.6* 20.0*  HGB 8.5* 7.9*  HCT 26.6* 24.3*  NA 137 135  K 5.3* 4.7  CL 111 110  CO2 17* 17*  BUN 51* 63*  CREATININE 1.74* 1.94*   Liver Panel No results for input(s): PROT, ALBUMIN, AST, ALT, ALKPHOS, BILITOT, BILIDIR, IBILI in the last 72 hours. Sedimentation Rate Recent Labs    06/13/19 0400   ESRSEDRATE 108*   C-Reactive Protein No results for input(s): CRP in the last 72 hours.  Microbiology: 9/20 [facility] -[facility]SA 9/22 BC- PEnding  Assessment/Plan: MRSA bacteremia secondary to the abscess in his right hand-  repeat BC Ng so far , no hardware,  2 d echo - valves look okay. 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 _AKI-may have to change vancomycin  Anemia- unclear etiology- was low in June 2020  Prior to that was normal. With AKi need to r/o MM or other causes _  DM- says he is no longer on any meds Last Hba1c was 6.3 in June 2020   Discussed the management with the patient

## 2019-06-13 NOTE — Progress Notes (Signed)
Subjective: 2 Days Post-Op Procedure(s) (LRB): IRRIGATION AND DEBRIDEMENT RIGHT THUMB (Right) Patient reports minimal pain to the right hand this morning. Patient is well, has no acute complaints. Plan is to go Home after hospital stay. Negative for chest pain and shortness of breath Fever: no Gastrointestinal:Negative for nausea and vomiting  Objective: Vital signs in last 24 hours: Temp:  [98.1 F (36.7 C)-99 F (37.2 C)] 98.2 F (36.8 C) (09/23 0453) Pulse Rate:  [94-97] 97 (09/23 0453) Resp:  [16-18] 18 (09/23 0453) BP: (125-141)/(66-76) 127/73 (09/23 0453) SpO2:  [92 %] 92 % (09/23 0453)  Intake/Output from previous day: No intake or output data in the 24 hours ending 06/13/19 0848  Intake/Output this shift: No intake/output data recorded.  Labs: Recent Labs    06/10/19 1451 06/11/19 0420 06/12/19 0414 06/13/19 0400  HGB 10.1* 9.0* 8.5* 7.9*   Recent Labs    06/12/19 0414 06/13/19 0400  WBC 25.6* 20.0*  RBC 2.91* 2.66*  HCT 26.6* 24.3*  PLT 702* 677*   Recent Labs    06/12/19 0414 06/13/19 0400  NA 137 135  K 5.3* 4.7  CL 111 110  CO2 17* 17*  BUN 51* 63*  CREATININE 1.74* 1.94*  GLUCOSE 135* 244*  CALCIUM 7.5* 7.7*   Recent Labs    06/11/19 0420  INR 1.3*     EXAM General - Patient is Alert, Appropriate and Oriented Extremity - Right hand dressing was removed this AM.  Mild purulent materal noted over the dorsal aspect of the thumb but he is able to flex and extend with minimal pain.  No significant swelling noted to the right hand, including over the dorsal and volar aspect of the hand, no erythema to the palm of the hand.  Both the dorsal and voral vessel loops were advanced approx. 1/2 inch before a new bulky dressing was applied to the right hand.  Able to flex and extend all fingers without pain, intact to light touch.  Cap refill intact to each digit.  Past Medical History:  Diagnosis Date  . Diabetes mellitus without complication  (HCC)     Assessment/Plan: 2 Days Post-Op Procedure(s) (LRB): IRRIGATION AND DEBRIDEMENT RIGHT THUMB (Right) Active Problems:   Sepsis ([place]) Anemia due to acute blood loss  Estimated body mass index is 22.15 kg/m as calculated from the following:   Height as of this encounter: 5\' 9"  (1.753 m).   Weight as of this encounter: 68 kg. Advance diet Up with therapy D/C IV fluids when tolerating po intake.  Labs reviewed, WBC 20.0 this morning, improved from 25 yesterday. Hg 7.9 this AM, continue to monitor. Hyperkalemia resolved. Cultures obtained during surgery have not returned, no current growth on gram stain. Currently on IV Vancomycin Will repeat dressing change tomorrow.  DVT Prophylaxis - heparin Non-weightbearing to the right hand.  Raquel , PA-C [facility] 06/13/2019, 8:48 AM

## 2019-06-13 NOTE — Consult Note (Signed)
Pharmacy Antibiotic Note  Nathan [name] is a 58 y.o. male admitted on 06/10/2019 with MRSA bacteremia secondary to the abscess in his right hand.  Pharmacy has been consulted for vancomycin dosing. He was originally started on cefepime also but therapy has been narrowed to target the bacteria source. This is day #3 of antibiotics, leukocytosis improved but still elevated, SCr continues to trend higher away from his baseline level    Plan:  Change dose of vancomycin to 1000 mg Q24H  F/U  renal function with AM labs.  AUC Goal 400-550 Expected AUC 519 Css: 33.4/13.5 mcg/mL SCr used: 1.94 (baseline~1.1) T1/2: 17.6 h  Height: 5\' 9"  (175.3 cm) Weight: 150 lb (68 kg) IBW/kg (Calculated) : 70.7  Temp (24hrs), Avg:98.4 F (36.9 C), Min:98.1 F (36.7 C), Max:99 F (37.2 C)  Recent Labs  Lab 06/10/19 1451 06/10/19 1632 06/10/19 2027 06/10/19 2317 06/11/19 0420 06/12/19 0414 06/13/19 0400  WBC 29.4*  --   --   --  23.3* 25.6* 20.0*  CREATININE 1.68*  --   --   --  1.46* 1.74* 1.94*  LATICACIDVEN  --  1.0 1.7 0.9  --   --   --     Estimated Creatinine Clearance: 40.9 mL/min (A) (by C-G formula based on SCr of 1.94 mg/dL (H)).    No Known Allergies  Antimicrobials this admission: cefepime 9/20 >> 9/22 vancomycin 9/20 >>   Dose adjustments this admission: 9/23 vancomycin 1250 mg q24h--> 1000 mg q24h  Microbiology results: 9/22 BCx: NGTD 9/21 WCx pending 9/20 BCx: 1/4 MRSA  9/20 SARS CoV-2: negative  9/20 MRSA PCR: positive  Thank you for allowing pharmacy to be a part of this patient's care.  Dallie Piles, PharmD 06/13/2019 9:54 AM

## 2019-06-13 NOTE — Consult Note (Signed)
ANTICOAGULATION CONSULT NOTE - Initial Consult  Pharmacy Consult for initiating apixaban Indication: new onset atrial fibrillation  No Known Allergies  Patient Measurements: Height: 5\' 9"  (175.3 cm) Weight: 150 lb (68 kg) IBW/kg (Calculated) : 70.7  Vital Signs: Temp: 98.2 F (36.8 C) (09/23 0453) BP: 127/73 (09/23 0453) Pulse Rate: 97 (09/23 0453)  Labs: Recent Labs    06/11/19 0420 06/12/19 0414 06/13/19 0400  HGB 9.0* 8.5* 7.9*  HCT 27.2* 26.6* 24.3*  PLT 686* 702* 677*  LABPROT 16.2*  --   --   INR 1.3*  --   --   CREATININE 1.46* 1.74* 1.94*    Estimated Creatinine Clearance: 40.9 mL/min (A) (by C-G formula based on SCr of 1.94 mg/dL (H)).   Medical History: Past Medical History:  Diagnosis Date  . Diabetes mellitus without complication (HCC)     Medications:  Scheduled:  . influenza vac split quadrivalent PF  0.5 mL Intramuscular Tomorrow-1000  . insulin aspart  0-5 Units Subcutaneous QHS  . insulin aspart  0-9 Units Subcutaneous TID WC  . mupirocin ointment  1 application Nasal BID  . pantoprazole  40 mg Oral BID  . senna  1 tablet Oral BID    Assessment: 58 year old male with history of HTN and DM admitted for management of sepsis secondary to right thumb infection and 1/4 MRSA bacteremia on vancomycin. This morning he developed atrial fibrillation with a rapid rate. He was administered IV diltiaziam with no relief. He is being transferred to telemetry on an IV Cardizem drip for rate control, cardiology consult, EKG & new ECHO pending.  Goal of Therapy:  Monitor platelets by anticoagulation protocol: Yes   Plan:   Initiate apixiban at a dose of 5 mg BID: this patient meets 1/3 dose reduction criteria  CBC at least every 3 days per protocol: Hgb noted to be in a downtrend   Dallie Piles, PharmD 06/13/2019,10:23 AM

## 2019-06-14 ENCOUNTER — Inpatient Hospital Stay (HOSPITAL_COMMUNITY)
Admit: 2019-06-14 | Discharge: 2019-06-14 | Disposition: A | Discharge status: Home / Self Care | Payer: Self-pay | Attending: Internal Medicine | Admitting: Internal Medicine

## 2019-06-14 ENCOUNTER — Encounter
Admission: EM | Disposition: A | Discharge status: Home Health | Payer: Self-pay | Source: Home / Self Care | Attending: Internal Medicine

## 2019-06-14 DIAGNOSIS — I361 Nonrheumatic tricuspid (valve) insufficiency: Secondary | ICD-10-CM

## 2019-06-14 DIAGNOSIS — R7881 Bacteremia: Secondary | ICD-10-CM

## 2019-06-14 DIAGNOSIS — I340 Nonrheumatic mitral (valve) insufficiency: Secondary | ICD-10-CM

## 2019-06-14 HISTORY — PX: TEE WITHOUT CARDIOVERSION: SHX5443

## 2019-06-14 LAB — GLUCOSE, CAPILLARY
Glucose-Capillary: 193 mg/dL — ABNORMAL HIGH (ref 70–99)
Glucose-Capillary: 241 mg/dL — ABNORMAL HIGH (ref 70–99)
Glucose-Capillary: 157 mg/dL — ABNORMAL HIGH (ref 70–99)
Glucose-Capillary: 197 mg/dL — ABNORMAL HIGH (ref 70–99)

## 2019-06-14 LAB — URINALYSIS, ROUTINE W REFLEX MICROSCOPIC
Bilirubin Urine: NEGATIVE
Glucose, UA: 50 mg/dL — AB
Ketones, ur: NEGATIVE mg/dL
Nitrite: NEGATIVE
Protein, ur: 30 mg/dL — AB
Specific Gravity, Urine: 1.018 (ref 1.005–1.030)
pH: 5 (ref 5.0–8.0)

## 2019-06-14 LAB — BASIC METABOLIC PANEL
Anion gap: 7 (ref 5–15)
BUN: 73 mg/dL — ABNORMAL HIGH (ref 6–20)
CO2: 18 mmol/L — ABNORMAL LOW (ref 22–32)
Calcium: 7.5 mg/dL — ABNORMAL LOW (ref 8.9–10.3)
Chloride: 109 mmol/L (ref 98–111)
Creatinine, Ser: 1.84 mg/dL — ABNORMAL HIGH (ref 0.61–1.24)
GFR calc Af Amer: 46 mL/min — ABNORMAL LOW (ref >60)
GFR calc non Af Amer: 40 mL/min — ABNORMAL LOW (ref >60)
Glucose, Bld: 199 mg/dL — ABNORMAL HIGH (ref 70–99)
Potassium: 4.5 mmol/L (ref 3.5–5.1)
Sodium: 134 mmol/L — ABNORMAL LOW (ref 135–145)

## 2019-06-14 LAB — CBC
HCT: 23.7 % — ABNORMAL LOW (ref 39.0–52.0)
Hemoglobin: 7.7 g/dL — ABNORMAL LOW (ref 13.0–17.0)
MCH: 29.3 pg (ref 26.0–34.0)
MCHC: 32.5 g/dL (ref 30.0–36.0)
MCV: 90.1 fL (ref 80.0–100.0)
Platelets: 636 10*3/uL — ABNORMAL HIGH (ref 150–400)
RBC: 2.63 MIL/uL — ABNORMAL LOW (ref 4.22–5.81)
RDW: 12.8 % (ref 11.5–15.5)
WBC: 17.5 10*3/uL — ABNORMAL HIGH (ref 4.0–10.5)
nRBC: 0 % (ref 0.0–0.2)

## 2019-06-14 LAB — CULTURE, BLOOD (ROUTINE X 2): Special Requests: ADEQUATE

## 2019-06-14 LAB — VANCOMYCIN, RANDOM: Vancomycin Rm: 25

## 2019-06-14 LAB — HEMOGLOBIN A1C
Hgb A1c MFr Bld: 7 % — ABNORMAL HIGH (ref 4.8–5.6)
Mean Plasma Glucose: 154 mg/dL

## 2019-06-14 SURGERY — ECHOCARDIOGRAM, TRANSESOPHAGEAL
Anesthesia: Moderate Sedation

## 2019-06-14 MED ORDER — MIDAZOLAM HCL 2 MG/2ML IJ SOLN
INTRAMUSCULAR | Status: AC | PRN
  Administered 2019-06-14 (×2): 1 mg via INTRAVENOUS
  Administered 2019-06-14: 2 mg via INTRAVENOUS

## 2019-06-14 MED ORDER — VANCOMYCIN HCL IN DEXTROSE 1-5 GM/200ML-% IV SOLN
1000.0000 mg | Freq: Once | INTRAVENOUS | Status: DC
  Filled 2019-06-14: qty 200

## 2019-06-14 MED ORDER — MIDAZOLAM HCL 5 MG/5ML IJ SOLN
INTRAMUSCULAR | Status: AC
  Filled 2019-06-14: qty 5

## 2019-06-14 MED ORDER — LINEZOLID 600 MG PO TABS
600.0000 mg | ORAL_TABLET | Freq: Two times a day (BID) | ORAL | Status: DC
  Administered 2019-06-14 – 2019-06-16 (×4): 600 mg via ORAL
  Filled 2019-06-14 (×5): qty 1

## 2019-06-14 MED ORDER — LIDOCAINE VISCOUS HCL 2 % MT SOLN
OROMUCOSAL | Status: AC
  Filled 2019-06-14: qty 15

## 2019-06-14 MED ORDER — SODIUM CHLORIDE 0.9 % IV SOLN
INTRAVENOUS | Status: DC
  Administered 2019-06-14 – 2019-06-15 (×3): via INTRAVENOUS

## 2019-06-14 MED ORDER — FENTANYL CITRATE (PF) 100 MCG/2ML IJ SOLN
INTRAMUSCULAR | Status: AC | PRN
  Administered 2019-06-14: 50 ug via INTRAVENOUS
  Administered 2019-06-14 (×2): 25 ug via INTRAVENOUS

## 2019-06-14 MED ORDER — INSULIN GLARGINE 100 UNIT/ML ~~LOC~~ SOLN
10.0000 [IU] | Freq: Every day | SUBCUTANEOUS | Status: DC
  Administered 2019-06-14 – 2019-06-16 (×3): 10 [IU] via SUBCUTANEOUS
  Filled 2019-06-14 (×3): qty 0.1

## 2019-06-14 MED ORDER — BUTAMBEN-TETRACAINE-BENZOCAINE 2-2-14 % EX AERO
INHALATION_SPRAY | CUTANEOUS | Status: AC
  Filled 2019-06-14: qty 5

## 2019-06-14 MED ORDER — FENTANYL CITRATE (PF) 100 MCG/2ML IJ SOLN
INTRAMUSCULAR | Status: AC
  Filled 2019-06-14: qty 2

## 2019-06-14 MED ORDER — LINEZOLID 600 MG PO TABS
600.0000 mg | ORAL_TABLET | Freq: Two times a day (BID) | ORAL | Status: DC
  Filled 2019-06-14: qty 1

## 2019-06-14 NOTE — Consult Note (Addendum)
Pharmacy Antibiotic Note  Nathan [name] is a 58 y.o. male admitted on 06/10/2019 with MRSA bacteremia secondary to the abscess in his right hand.  Pharmacy has been consulted for vancomycin dosing. He was originally started on cefepime also but therapy has been narrowed to target the bacteria source. This is day #3 of antibiotics, leukocytosis improved but still elevated, SCr continues to trend higher away from his baseline level    Plan: 09/24 @ 0500 VR 25 mcg/mL, random level a bit above 20 mcg/mL, will administer vanc 1g IV x 1 at 1230 5 hours post random, which should allow patient to be at 20 mcg/mL. Will check another BMP at 1230 and w/ am labs w/ another random level to assess renal function.  Height: 5\' 9"  (175.3 cm) Weight: 150 lb (68 kg) IBW/kg (Calculated) : 70.7  Temp (24hrs), Avg:97.9 F (36.6 C), Min:97.4 F (36.3 C), Max:98.5 F (36.9 C)  Recent Labs  Lab 06/10/19 1451 06/10/19 1632 06/10/19 2027 06/10/19 2317 06/11/19 0420 06/12/19 0414 06/13/19 0400 06/14/19 0459  WBC 29.4*  --   --   --  23.3* 25.6* 20.0*  --   CREATININE 1.68*  --   --   --  1.46* 1.74* 1.94* 1.84*  LATICACIDVEN  --  1.0 1.7 0.9  --   --   --   --   VANCORANDOM  --   --   --   --   --   --   --  25    Estimated Creatinine Clearance: 43.1 mL/min (A) (by C-G formula based on SCr of 1.84 mg/dL (H)).    No Known Allergies  Antimicrobials this admission: cefepime 9/20 >> 9/22 vancomycin 9/20 >>   Dose adjustments this admission: 9/23 vancomycin 1250 mg q24h--> 1000 mg q24h  Microbiology results: 9/22 BCx: NGTD 9/21 WCx pending 9/20 BCx: 1/4 MRSA  9/20 SARS CoV-2: negative  9/20 MRSA PCR: positive  Thank you for allowing pharmacy to be a part of this patient's care.  Tobie Lords, PharmD 06/14/2019 6:31 AM

## 2019-06-14 NOTE — Progress Notes (Signed)
*  PRELIMINARY RESULTS* Echocardiogram Echocardiogram Transesophageal has been performed.  Nathan [name] 06/14/2019, 2:46 PM

## 2019-06-14 NOTE — Plan of Care (Signed)

## 2019-06-14 NOTE — Progress Notes (Signed)
Progress Note  Patient Name: Nathan [name] Date of Encounter: 06/14/2019  Primary Cardiologist: New, Dr. Garen Lah  Subjective   Patient consented for TEE this AM and agreeable to proceed with procedure (see previous note for documentation).   He denies current chest pain. Does report intermittent palpitations, which is not new for him. No SOB/DOE.   Inpatient Medications    Scheduled Meds:  heparin  5,000 Units Subcutaneous Q8H   insulin aspart  0-5 Units Subcutaneous QHS   insulin aspart  0-9 Units Subcutaneous TID WC   insulin glargine  10 Units Subcutaneous Daily   metoprolol tartrate  25 mg Oral Q6H   mupirocin ointment  1 application Nasal BID   pantoprazole  40 mg Oral BID   senna  1 tablet Oral BID   vancomycin variable dose per unstable renal function (pharmacist dosing)   Does not apply See admin instructions   Continuous Infusions:  sodium chloride Stopped (06/13/19 0130)   sodium chloride 100 mL/hr at 06/14/19 1023   vancomycin     PRN Meds: acetaminophen, bisacodyl, HYDROmorphone (DILAUDID) injection, magnesium citrate, magnesium hydroxide, oxyCODONE, oxyCODONE, phenol   Vital Signs    Vitals:   06/13/19 1456 06/13/19 1926 06/13/19 2157 06/14/19 0436  BP: (!) 142/75 (!) 141/70 113/60 110/62  Pulse: 90 88 79 69  Resp: 20 16  17   Temp: 97.8 F (36.6 C) (!) 97.4 F (36.3 C)  98.5 F (36.9 C)  TempSrc: Oral Oral  Oral  SpO2: 94% 94%  94%  Weight:      Height:        Intake/Output Summary (Last 24 hours) at 06/14/2019 1307 Last data filed at 06/14/2019 1219 Gross per 24 hour  Intake 1726.69 ml  Output 300 ml  Net 1426.69 ml   Last 3 Weights 06/10/2019  Weight (lbs) 150 lb  Weight (kg) 68.04 kg      Telemetry    Not on telemetry (per RN, attending MD discontinued) - Personally Reviewed  ECG    No new tracings - Personally Reviewed  Physical Exam   GEN: No acute distress.  Lying in bed. Neck: No JVD Cardiac: RRR, no  murmurs, rubs, or gallops.  Respiratory: Clear to auscultation bilaterally. GI: Thin, Soft, nontender, non-distended  MS: No edema; No deformity. Neuro:  Nonfocal  Psych: Normal affect   Labs    High Sensitivity Troponin:  No results for input(s): TROPONINIHS in the last 720 hours.    Cardiac EnzymesNo results for input(s): TROPONINI in the last 168 hours. No results for input(s): TROPIPOC in the last 168 hours.   Chemistry Recent Labs  Lab 06/12/19 0414 06/13/19 0400 06/14/19 0459  NA 137 135 134*  K 5.3* 4.7 4.5  CL 111 110 109  CO2 17* 17* 18*  GLUCOSE 135* 244* 199*  BUN 51* 63* 73*  CREATININE 1.74* 1.94* 1.84*  CALCIUM 7.5* 7.7* 7.5*  GFRNONAA 43* 38* 40*  GFRAA 50* 44* 46*  ANIONGAP 9 8 7      Hematology Recent Labs  Lab 06/12/19 0414 06/13/19 0400 06/14/19 1234  WBC 25.6* 20.0* 17.5*  RBC 2.91* 2.66* 2.63*  HGB 8.5* 7.9* 7.7*  HCT 26.6* 24.3* 23.7*  MCV 91.4 91.4 90.1  MCH 29.2 29.7 29.3  MCHC 32.0 32.5 32.5  RDW 12.4 12.6 12.8  PLT 702* 677* 636*    BNPNo results for input(s): BNP, PROBNP in the last 168 hours.   DDimer No results for input(s): DDIMER in the last 168  hours.   Radiology    No results found.  Cardiac Studies   Echo 06/13/2019 1. Left ventricular ejection fraction, by visual estimation, is 45 to 50%. The left ventricle has mildly decreased function. Normal left ventricular size. There is no left ventricular hypertrophy. 2. Global right ventricle has normal systolic function.The right ventricular size is normal. No increase in right ventricular wall thickness. 3. Left atrial size was normal. 4. Right atrial size was normal. 5. The mitral valve is normal in structure. Mild mitral valve regurgitation. No evidence of mitral stenosis. 6. The tricuspid valve is normal in structure. Tricuspid valve regurgitation mild-moderate. 7. The aortic valve is normal in structure. Aortic valve regurgitation was not visualized by color flow  Doppler. Structurally normal aortic valve, with no evidence of sclerosis or stenosis. 8. The pulmonic valve was normal in structure. Pulmonic valve regurgitation is not visualized by color flow Doppler. 9. Mildly elevated pulmonary artery systolic pressure. 10. The inferior vena cava is normal in size with greater than 50% respiratory variability, suggesting right atrial pressure of 3 mmHg.  Patient Profile     58 y.o. male  with a history of DM2, hypertension, remote history of smoking, and self-reported PAF previously on ASA (discontinued for headache) who was seen for the evaluation of atrial fibrillation and reconsulted for TEE d/t bacteremia to be performed today 9/24 at [facility] per ID request.  Assessment & Plan    Paroxysmal Atrial fibrillation with RVR --Currently SR on exam. Not on telemetry. --PAF not new- diagnosed in early 2000s.  At that time, recommendation was for ASA 81 mg daily.  Patient reportedly stopped this due to headaches. Symptomatic with PAF and feels he is in and out of atrial fibrillation quite frequently. Symptoms include racing heart rate and palpitations, as well as intermittent shortness of breath/DOE.  He feels that his PAF is often triggered by stressors. --CHA2DS2VASc score of at least 2 (HTN, DM2) with recommendation for long-term anticoagulation.  Continue IV heparin.  Plan for [facility] at discharge as below with Eliquis 5mg  BID. Does not meet criteria for reduced dosing at this time. If patient's weight drops below 60kg, he will meet criteria for reduced dosing d/t weight and renal function.   --Discontinued Cardizem, which is not recommended in patients with reduced EF. --Continue rate control with Lopressor. Consolidation at discharge.  --TSH wnl. Daily BMET to monitor renal funciton, electrolytes.  --Consider ZIO to assess burden of atrial fibrillation and need for further ischemic workup.   Bacteremia --Consented for TEE, documented earlier today. TEE request  per ID d/t bacteremia.    HFrEF, mild pulmonary hypertension --Patient euvolemic to dry on exam. 9/22 EF 40 to 50%.  PASP mildly elevated. --As above, consider ZIO at discharge to assess burden of EF. May also benefit from further outpatient ischemic work-up. Risk factors for CAD: h/o smoking, HTN.   --Do not recommend IV diuresis as previously noted, given euvolemic to dry on exam with rising creatinine as below and soft pressures. Caution with IVF, given reduced EF. --Continue metoprolol and titrate as needed. BB to be consolidated at discharge as above. If renal function and BP allows, recommend addition of ACE/ARB given reduced EF with comorbid conditions of hypertension and diabetes (as recommended per guidelines). Can be added as outpatient. Daily BMET recommended.   Mild to moderate TR --Will continue to monitor with periodic echocardiogram and/or worsening of symptoms, such as SOB.  BP and heart rate control recommended at this time.   HTN --  Controlled, soft. Titrate metoprolol for optimal HR and BP control.  Cardizem/ diltiazem not recommended as above.  --If renal function / BP allows, consider addition of ACE/ARB per guidelines and prior to discharge given comorbid DM2/HTN with reduced EF. Can be added as outpatient otherwise.  AKI --Daily BMET. Cr improved from yesterday 1.94  1.84 but still above baseline. Started on IVF (caution with fluids).  --Continue to defer start of ACE/ARB as above due to soft pressure and AKI with plan for start prior to discharge if renal function and BP allows.  Can be started as OP. Renally dose medications.  Caution with contrast procedures.  DM2 --SSI, per IM.  For questions or updates, please contact [place] Please consult [email or web address] for contact info under        Signed, Arvil Chaco, PA-C  06/14/2019, 1:07 PM

## 2019-06-14 NOTE — Progress Notes (Signed)
Transesophageal Echocardiogram :  Indication:bacteremia, possible endocarditis Requesting/ordering  physician: Tsosie Billing  Procedure: Benzocaine spray x2 and 2 mls x 2 of viscous lidocaine were given orally to provide local anesthesia to the oropharynx. The patient was positioned supine on the left side, bite block provided. The patient was moderately sedated with the doses of versed and fentanyl as detailed below.  Using digital technique an omniplane probe was advanced into the distal esophagus without incident.   Moderate sedation: 1. Sedation used:  Versed: 4 mg iv, Fentanyl: 1-00 ug IV 2. Time administered:   2 PM   Time when patient started recovery:2:35 pm Total sedation time 35 minutes 3. I was face to face during this time,  [id number]  See report in EPIC  for complete details: In brief,  No valvular endocarditis transgastric imaging revealed normal LV function with no RWMAs and no mural apical thrombus.  .  Estimated ejection fraction was 55%.  Right sided cardiac chambers were normal with no evidence of pulmonary hypertension.  Imaging of the septum showed no ASD or VSD Bubble study was negative for shunt 2D and color flow confirmed no PFO  Mild mitral valve regurgitation Trace TR Valves well visualized  The LA was well visualized in orthogonal views.  There was no spontaneous contrast and no thrombus in the LA and LA appendage  Left atrium mildly dilated  The descending thoracic aorta had no  mural aortic debris with no evidence of aneurysmal dilation or disection   Nathan [name] 06/14/2019 2:38 PM

## 2019-06-14 NOTE — Progress Notes (Addendum)
Subjective: 3 Days Post-Op Procedure(s) (LRB): IRRIGATION AND DEBRIDEMENT RIGHT THUMB (Right) Patient reports minimal pain to the right hand this morning. Scheduled for TEE later this afternoon. Patient is well, has no acute complaints to the right hand, he is using it with mild pain while in the splint. Plan is to go Home after hospital stay. Negative for chest pain and shortness of breath Fever: no Gastrointestinal:Negative for nausea and vomiting  Objective: Vital signs in last 24 hours: Temp:  [97.4 F (36.3 C)-98.5 F (36.9 C)] 98.5 F (36.9 C) (09/24 0436) Pulse Rate:  [69-90] 69 (09/24 0436) Resp:  [16-20] 17 (09/24 0436) BP: (110-142)/(60-75) 110/62 (09/24 0436) SpO2:  [94 %] 94 % (09/24 0436)  Intake/Output from previous day:  Intake/Output Summary (Last 24 hours) at 06/14/2019 1140 Last data filed at 06/14/2019 0900 Gross per 24 hour  Intake 1726.69 ml  Output 100 ml  Net 1626.69 ml    Intake/Output this shift: Total I/O In: 240 [P.O.:240] Out: -   Labs: Recent Labs    06/12/19 0414 06/13/19 0400  HGB 8.5* 7.9*   Recent Labs    06/12/19 0414 06/13/19 0400  WBC 25.6* 20.0*  RBC 2.91* 2.66*  HCT 26.6* 24.3*  PLT 702* 677*   Recent Labs    06/13/19 0400 06/14/19 0459  NA 135 134*  K 4.7 4.5  CL 110 109  CO2 17* 18*  BUN 63* 73*  CREATININE 1.94* 1.84*  GLUCOSE 244* 199*  CALCIUM 7.7* 7.5*   No results for input(s): LABPT, INR in the last 72 hours.   EXAM General - Patient is Alert, Appropriate and Oriented Extremity - Right hand dressing was removed this AM.  Mild purulent materal noted over the dorsal aspect of the thumb but he is able to flex and extend with minimal pain.  No significant swelling noted to the right hand, including over the dorsal and volar aspect of the hand, no erythema to the palm of the hand.  Both the dorsal and voral vessel loops were removed.  New bulky dressing was applied to the right hand.  Able to flex and extend  all fingers without pain, intact to light touch.  Cap refill intact to each digit.          Past Medical History:  Diagnosis Date  . Diabetes mellitus without complication (HCC)    Assessment/Plan: 3 Days Post-Op Procedure(s) (LRB): IRRIGATION AND DEBRIDEMENT RIGHT THUMB (Right) Active Problems:   Sepsis ([place]) Anemia due to acute blood loss  Estimated body mass index is 22.15 kg/m as calculated from the following:   Height as of this encounter: 5\' 9"  (1.753 m).   Weight as of this encounter: 68 kg. Advance diet Up with therapy D/C IV fluids when tolerating po intake.  Repeat CBC has been ordered for today. Cultures are growing rare staphylococcus aureus,  Currently on IV Vancomycin Will repeat dressing change tomorrow.  DVT Prophylaxis - heparin Non-weightbearing to the right hand.  Raquel James, PA-C [facility] 06/14/2019, 11:40 AM

## 2019-06-14 NOTE — Progress Notes (Signed)
    [facility] has been requested to perform a transesophageal echocardiogram on 06/14/2019 at 2:00PM for bacteremia per ID request.  After careful review of history and examination, the risks and benefits of transesophageal echocardiogram have been explained including risks of esophageal damage, perforation (1:10,000 risk), bleeding, pharyngeal hematoma as well as other potential complications associated with conscious sedation including aspiration, arrhythmia, respiratory failure and death. Alternatives to treatment were discussed, questions were answered. Patient is willing to proceed.   Arvil Chaco, PA-C 06/14/2019 1:06 PM

## 2019-06-14 NOTE — Progress Notes (Signed)
[place] [facility] [place] NAME: Nathan [name]    MR#:  [id number]  DATE OF BIRTH:  [birth year]  SUBJECTIVE:  CHIEF COMPLAINT:   Chief Complaint  Patient presents with  . Wound Infection   Status post incision and drainage of the right hand abscess The patient has no complaints. REVIEW OF SYSTEMS:    ROS  CONSTITUTIONAL: No documented fever. No fatigue, weakness. No weight gain, no weight loss.  EYES: No blurry or double vision.  ENT: No tinnitus. No postnasal drip. No redness of the oropharynx.  RESPIRATORY: No cough, no wheeze, no hemoptysis. No dyspnea.  CARDIOVASCULAR: No chest pain. No orthopnea. Has palpitations. No syncope.  GASTROINTESTINAL: No nausea, no vomiting or diarrhea. No abdominal pain. No melena or hematochezia.  GENITOURINARY: No dysuria or hematuria.  ENDOCRINE: No polyuria or nocturia. No heat or cold intolerance.  HEMATOLOGY: No anemia. No bruising. No bleeding.  INTEGUMENTARY: No rashes. No lesions.  MUSCULOSKELETAL: No arthritis.  No gout.  Right hand bandage noted NEUROLOGIC: No numbness, tingling, or ataxia. No seizure-type activity.  PSYCHIATRIC: No anxiety. No insomnia. No ADD.   DRUG ALLERGIES:  No Known Allergies  VITALS:  Blood pressure 132/69, pulse 72, temperature 97.9 F (36.6 C), temperature source Oral, resp. rate 15, height 5\' 9"  (1.753 m), weight 68 kg, SpO2 90 %.  PHYSICAL EXAMINATION:   Physical Exam  GENERAL:  58 y.o.-year-old patient lying in the bed with no acute distress.  EYES: Pupils equal, round, reactive to light and accommodation. No scleral icterus. Extraocular muscles intact.  HEENT: Head atraumatic, normocephalic. Oropharynx and nasopharynx clear.  NECK:  Supple, no jugular venous distention. No thyroid enlargement, no tenderness.  LUNGS: Normal breath sounds bilaterally, no wheezing, rales, rhonchi. No use of accessory muscles of respiration.  CARDIOVASCULAR: S1, S2 irregular.  No murmurs, rubs, or gallops.  ABDOMEN: Soft, nontender, nondistended. Bowel sounds present. No organomegaly or mass.  EXTREMITIES: No cyanosis, clubbing or edema b/l.   Right hand bandaged.  NEUROLOGIC: Cranial nerves II through XII are intact. No focal Motor or sensory deficits b/l.   PSYCHIATRIC: The patient is alert and oriented x 3.  SKIN: No jaundice or ulcer.     LABORATORY PANEL:   CBC Recent Labs  Lab 06/14/19 1234  WBC 17.5*  HGB 7.7*  HCT 23.7*  PLT 636*   ------------------------------------------------------------------------------------------------------------------ Chemistries  Recent Labs  Lab 06/11/19 0420  06/14/19 0459  NA 134*   < > 134*  K 5.2*   < > 4.5  CL 108   < > 109  CO2 19*   < > 18*  GLUCOSE 131*   < > 199*  BUN 51*   < > 73*  CREATININE 1.46*   < > 1.84*  CALCIUM 7.5*   < > 7.5*  MG 2.5*  --   --    < > = values in this interval not displayed.   ------------------------------------------------------------------------------------------------------------------  Cardiac Enzymes No results for input(s): TROPONINI in the last 168 hours. ------------------------------------------------------------------------------------------------------------------  RADIOLOGY:  No results found.   ASSESSMENT AND PLAN:    Patient is a 58 year old male with history of hypertension and diabetes mellitus admitted for management of sepsis secondary to right thumb infection and currently under hospitalist service.  1.  Sepsis secondary to right thumb abscess, MRSA bacteremia; leukocytosis. CT of the hand revealed abscess Status post incision and drainage by surgery. Antibiotics narrowed down to IV vancomycin Cefepime discontinued.  Follow-up CBC. TEE per  Dr. Arlyss Repress. TEE today per Dr. Rockey Situ.  2.  Atrial fibrillation with rapid rate He was treated with IV Cardizem drip for rate control Changed to Lopressor and started Eliquis per Dr. Rockey Situ.   3.  Hypertension Continue hypertension medication.  5.  Diabetes mellitus type 2 Placed on sliding scale insulin coverage.  Add Lantus 10 units daily.  A1c 7.0.  6.  Hyperkalemia Resolved with Veltassa  Anemia of chronic disease.  Hemoglobin gradually decreased to 7.7.  Follow-up hemoglobin. CKD stage III.  Stable.  All the records are reviewed and case discussed with ED provider. Management plans discussed with the patient, family and they are in agreement.  CODE STATUS: Full code All the records are reviewed and case discussed with Care Management/Social Worker. Management plans discussed with the patient, family and they are in agreement.  CODE STATUS: Full code  DVT Prophylaxis: SCDs  TOTAL CRITICAL CARE TIME TAKING CARE OF THIS PATIENT: 52 minutes.   POSSIBLE D/C IN 2 to 3 DAYS, DEPENDING ON CLINICAL CONDITION.  Demetrios Loll M.D on 06/14/2019 at 2:26 PM  Between 7am to 6pm - Pager - [phone]  After 6pm go to [email or web address] - password [email or web address] [place] [facility]  Office  [phone]  CC: Primary care physician; Patient, No Pcp Per  Note: This dictation was prepared with Dragon dictation along with smaller phrase technology. Any transcriptional errors that result from this process are unintentional.

## 2019-06-14 NOTE — Progress Notes (Signed)
ID  Pt doing well Had TEE no vegetation Rt hand better   Patient Vitals for the past 24 hrs:  BP Temp Temp src Pulse Resp SpO2  06/14/19 1640 - - - - - 92 %  06/14/19 1616 138/68 98.1 F (36.7 C) - 75 - 90 %  06/14/19 1500 128/67 - - 73 19 95 %  06/14/19 1445 120/66 - - 67 13 94 %  06/14/19 1430 119/72 - - 69 11 98 %  06/14/19 1425 131/77 - - 70 10 97 %  06/14/19 1420 132/69 - - 72 15 90 %  06/14/19 1418 (!) 143/76 - - 73 18 90 %  06/14/19 1400 - - - 75 18 97 %  06/14/19 1342 (!) 146/73 97.9 F (36.6 C) Oral - 18 94 %  06/14/19 0436 110/62 98.5 F (36.9 C) Oral 69 17 94 %  06/13/19 2157 113/60 - - 79 - -    Awake and alert Pale Chest b/l air entry Hs irregular   CBC Latest Ref Rng & Units 06/14/2019 06/13/2019 06/12/2019  WBC 4.0 - 10.5 K/uL 17.5(H) 20.0(H) 25.6(H)  Hemoglobin 13.0 - 17.0 g/dL 7.7(L) 7.9(L) 8.5(L)  Hematocrit 39.0 - 52.0 % 23.7(L) 24.3(L) 26.6(L)  Platelets 150 - 400 K/uL 636(H) 677(H) 702(H)    CMP Latest Ref Rng & Units 06/14/2019 06/13/2019 06/12/2019  Glucose 70 - 99 mg/dL 199(H) 244(H) 135(H)  BUN 6 - 20 mg/dL 73(H) 63(H) 51(H)  Creatinine 0.61 - 1.24 mg/dL 1.84(H) 1.94(H) 1.74(H)  Sodium 135 - 145 mmol/L 134(L) 135 137  Potassium 3.5 - 5.1 mmol/L 4.5 4.7 5.3(H)  Chloride 98 - 111 mmol/L 109 110 111  CO2 22 - 32 mmol/L 18(L) 17(L) 17(L)  Calcium 8.9 - 10.3 mg/dL 7.5(L) 7.7(L) 7.5(L)   [facility] 9/20 MRSA 1 of 4 bottles ( anerobic bottle only) 9/22 BC NG Wound culture MRSA   Impression/Recommendation  MRSA bacteremia from rt hand MRSA abscess- low bioburden- repeat BC neg promptly TEE no vegetation On vanco but creatinine worsening Will DC vanco and start Linezolid as patient does not want IV antibiotics to go home with and also no insurance and daptomycin will be expensive for him. Linezolid 669m PO BID is better for skin and soft tissue infection and no endocarditis as well, will keep a close eye on the Hb which is low at baseline  Anemia -  unclear etiology With AKI, high ESR , MM panel sent  DM- had stopped his meds   HTn  Discussed the management with the patient and explained side effects of linezolid

## 2019-06-15 ENCOUNTER — Other Ambulatory Visit: Payer: Self-pay | Admitting: Infectious Diseases

## 2019-06-15 ENCOUNTER — Encounter: Payer: Self-pay | Admitting: Cardiovascular Disease

## 2019-06-15 DIAGNOSIS — N183 Chronic kidney disease, stage 3 (moderate): Secondary | ICD-10-CM

## 2019-06-15 DIAGNOSIS — N171 Acute kidney failure with acute cortical necrosis: Secondary | ICD-10-CM

## 2019-06-15 DIAGNOSIS — I420 Dilated cardiomyopathy: Secondary | ICD-10-CM

## 2019-06-15 DIAGNOSIS — R7881 Bacteremia: Secondary | ICD-10-CM

## 2019-06-15 DIAGNOSIS — B9562 Methicillin resistant Staphylococcus aureus infection as the cause of diseases classified elsewhere: Secondary | ICD-10-CM

## 2019-06-15 DIAGNOSIS — L03113 Cellulitis of right upper limb: Secondary | ICD-10-CM

## 2019-06-15 DIAGNOSIS — A419 Sepsis, unspecified organism: Secondary | ICD-10-CM

## 2019-06-15 LAB — BASIC METABOLIC PANEL
Anion gap: 6 (ref 5–15)
BUN: 62 mg/dL — ABNORMAL HIGH (ref 6–20)
CO2: 19 mmol/L — ABNORMAL LOW (ref 22–32)
Calcium: 7.5 mg/dL — ABNORMAL LOW (ref 8.9–10.3)
Chloride: 112 mmol/L — ABNORMAL HIGH (ref 98–111)
Creatinine, Ser: 1.52 mg/dL — ABNORMAL HIGH (ref 0.61–1.24)
GFR calc Af Amer: 59 mL/min — ABNORMAL LOW (ref >60)
GFR calc non Af Amer: 50 mL/min — ABNORMAL LOW (ref >60)
Glucose, Bld: 213 mg/dL — ABNORMAL HIGH (ref 70–99)
Potassium: 4.3 mmol/L (ref 3.5–5.1)
Sodium: 137 mmol/L (ref 135–145)

## 2019-06-15 LAB — CBC
HCT: 23.6 % — ABNORMAL LOW (ref 39.0–52.0)
Hemoglobin: 7.6 g/dL — ABNORMAL LOW (ref 13.0–17.0)
MCH: 29.3 pg (ref 26.0–34.0)
MCHC: 32.2 g/dL (ref 30.0–36.0)
MCV: 91.1 fL (ref 80.0–100.0)
Platelets: 736 10*3/uL — ABNORMAL HIGH (ref 150–400)
RBC: 2.59 MIL/uL — ABNORMAL LOW (ref 4.22–5.81)
RDW: 12.8 % (ref 11.5–15.5)
WBC: 17.4 10*3/uL — ABNORMAL HIGH (ref 4.0–10.5)
nRBC: 0 % (ref 0.0–0.2)

## 2019-06-15 LAB — HEMOGLOBIN A1C
Hgb A1c MFr Bld: 7.2 % — ABNORMAL HIGH (ref 4.8–5.6)
Mean Plasma Glucose: 160 mg/dL

## 2019-06-15 LAB — CULTURE, BLOOD (ROUTINE X 2): Culture: NO GROWTH

## 2019-06-15 LAB — GLUCOSE, CAPILLARY
Glucose-Capillary: 170 mg/dL — ABNORMAL HIGH (ref 70–99)
Glucose-Capillary: 163 mg/dL — ABNORMAL HIGH (ref 70–99)
Glucose-Capillary: 192 mg/dL — ABNORMAL HIGH (ref 70–99)
Glucose-Capillary: 191 mg/dL — ABNORMAL HIGH (ref 70–99)

## 2019-06-15 NOTE — Progress Notes (Signed)
Subjective: 1 Day Post-Op Procedure(s) (LRB): TRANSESOPHAGEAL ECHOCARDIOGRAM (TEE) (N/A) Patient reports minimal pain to the right hand this morning. Underwent TEE yesterday. Patient is well, has no acute complaints to the right hand, he is using it with mild pain while in the splint. Plan is to go Home after hospital stay. Negative for chest pain and shortness of breath Fever: no Gastrointestinal:Negative for nausea and vomiting  Objective: Vital signs in last 24 hours: Temp:  [97.9 F (36.6 C)-98.3 F (36.8 C)] 98.3 F (36.8 C) (09/25 0402) Pulse Rate:  [67-82] 82 (09/25 0402) Resp:  [10-19] 18 (09/25 0402) BP: (119-146)/(66-79) 139/79 (09/25 0402) SpO2:  [90 %-98 %] 95 % (09/25 0402)  Intake/Output from previous day:  Intake/Output Summary (Last 24 hours) at 06/15/2019 0739 Last data filed at 06/15/2019 0300 Gross per 24 hour  Intake 1784.48 ml  Output 200 ml  Net 1584.48 ml    Intake/Output this shift: No intake/output data recorded.  Labs: Recent Labs    06/13/19 0400 06/14/19 1234 06/15/19 0509  HGB 7.9* 7.7* 7.6*   Recent Labs    06/14/19 1234 06/15/19 0509  WBC 17.5* 17.4*  RBC 2.63* 2.59*  HCT 23.7* 23.6*  PLT 636* 736*   Recent Labs    06/14/19 0459 06/15/19 0509  NA 134* 137  K 4.5 4.3  CL 109 112*  CO2 18* 19*  BUN 73* 62*  CREATININE 1.84* 1.52*  GLUCOSE 199* 213*  CALCIUM 7.5* 7.5*   No results for input(s): LABPT, INR in the last 72 hours.   EXAM General - Patient is Alert, Appropriate and Oriented Extremity - Right hand dressing is intact without drainage.  Able to flex and extend all fingers without pain.  Intact to light touch.  Cap refill intact to each digit.  Kept dressing intact this morning, will plan to change dressing tomorrow morning or prior to d/c today.        Past Medical History:  Diagnosis Date  . Diabetes mellitus without complication (HCC)    Assessment/Plan: 1 Day Post-Op Procedure(s) (LRB):  TRANSESOPHAGEAL ECHOCARDIOGRAM (TEE) (N/A) Active Problems:   Sepsis ([place]) Anemia due to acute blood loss  Estimated body mass index is 22.15 kg/m as calculated from the following:   Height as of this encounter: 5\' 9"  (1.753 m).   Weight as of this encounter: 68 kg. Advance diet Up with therapy   Labs reviewed this AM.  WBC 17.4 this AM. Hg 7.6 this morning. ID has switched the patient to oral Linezolid. Will plan on changing dressing either tomorrow morning or this afternoon based on discharge planning.   DVT Prophylaxis - heparin Non-weightbearing to the right hand.  Raquel Brewster Wolters, PA-C [facility] 06/15/2019, 7:39 AM

## 2019-06-15 NOTE — Progress Notes (Signed)
ID Pt feeling less swollen since stopping IV vanco No fever No diarrhea  o/e  Patient Vitals for the past 24 hrs:  BP Temp Temp src Pulse Resp SpO2  06/15/19 1514 (!) 158/80 98 F (36.7 C) Oral 85 18 95 %  06/15/19 0402 139/79 98.3 F (36.8 C) - 82 18 95 %  06/14/19 2119 139/73 97.9 F (36.6 C) - 80 14 95 %  awake and alert , pale Chest CTA HS s1s2 abd soft Edema ankles improving  Rt hand dressing changed- rt thumb swollen and erythematous, surgical incision site over the thenar eminence No discharge No tenderness movt full          Impression/recommendation  MRSA abscess rt thumb/thenar eminence- s/p I/D On linezolid  MRSA bacteremia 1 of 4 bottle- TEE neg- repeat culture in 48 hrs promptly negative After 5 days of vanco changed to linezolid ebcause of worsening cr and as patient does not want to take IV antibiotic linezolid is a better choice than dapto Will give 14 days of linezolid on discharge  Anemia- baseline before starting linezolid was 7.7 watch closely for marrow suppression while on linezolid  DM Pt  Will follow up with me in 10 days as OP Will need CBC/BMP next week after discharge. I have put the orders in the system and he will have to come to [facility] OP lab next week. Will need to follow up with surgeon as OP for suture removal   Discussed the management with the patient- ID will sign off- call if needed

## 2019-06-15 NOTE — Progress Notes (Signed)
Progress Note  Patient Name: Nathan [name] Date of Encounter: 06/15/2019  Primary Cardiologist: New, Dr. Garen Lah  Subjective   Reports having some mild shortness of breath, forearms are swollen, legs swollen He attributes this to IV fluids Overall though reports feeling better  Denies any palpitations concerning for atrial fibrillation He is not on telemetry  Inpatient Medications    Scheduled Meds: . heparin  5,000 Units Subcutaneous Q8H  . insulin aspart  0-5 Units Subcutaneous QHS  . insulin aspart  0-9 Units Subcutaneous TID WC  . insulin glargine  10 Units Subcutaneous Daily  . linezolid  600 mg Oral Q12H  . metoprolol tartrate  25 mg Oral Q6H  . mupirocin ointment  1 application Nasal BID  . pantoprazole  40 mg Oral BID  . senna  1 tablet Oral BID   Continuous Infusions: . sodium chloride Stopped (06/13/19 0130)   PRN Meds: acetaminophen, bisacodyl, HYDROmorphone (DILAUDID) injection, magnesium citrate, magnesium hydroxide, oxyCODONE, oxyCODONE, phenol   Vital Signs    Vitals:   06/14/19 1616 06/14/19 1640 06/14/19 2119 06/15/19 0402  BP: 138/68  139/73 139/79  Pulse: 75  80 82  Resp:   14 18  Temp: 98.1 F (36.7 C)  97.9 F (36.6 C) 98.3 F (36.8 C)  TempSrc:      SpO2: 90% 92% 95% 95%  Weight:      Height:        Intake/Output Summary (Last 24 hours) at 06/15/2019 1459 Last data filed at 06/15/2019 0300 Gross per 24 hour  Intake 1544.48 ml  Output -  Net 1544.48 ml   Last 3 Weights 06/10/2019  Weight (lbs) 150 lb  Weight (kg) 68.04 kg      Telemetry    Not on telemetry- Personally Reviewed  ECG    No new tracings - Personally Reviewed  Physical Exam   Constitutional:  oriented to person, place, and time. No distress.  HENT:  Head: Grossly normal Eyes:  no discharge. No scleral icterus.  Neck: No JVD, no carotid bruits  Cardiovascular: Regular rate and rhythm, no murmurs appreciated 1+ pitting lower extremity edema  Pulmonary/Chest: Clear to auscultation bilaterally, no wheezes or rails Abdominal: Soft.  no distension.  no tenderness.  Musculoskeletal: Normal range of motion Neurological:  normal muscle tone. Coordination normal. No atrophy Skin: Skin warm and dry Psychiatric: normal affect, pleasant   Labs    High Sensitivity Troponin:  No results for input(s): TROPONINIHS in the last 720 hours.    Cardiac EnzymesNo results for input(s): TROPONINI in the last 168 hours. No results for input(s): TROPIPOC in the last 168 hours.   Chemistry Recent Labs  Lab 06/13/19 0400 06/14/19 0459 06/15/19 0509  NA 135 134* 137  K 4.7 4.5 4.3  CL 110 109 112*  CO2 17* 18* 19*  GLUCOSE 244* 199* 213*  BUN 63* 73* 62*  CREATININE 1.94* 1.84* 1.52*  CALCIUM 7.7* 7.5* 7.5*  GFRNONAA 38* 40* 50*  GFRAA 44* 46* 59*  ANIONGAP 8 7 6      Hematology Recent Labs  Lab 06/13/19 0400 06/14/19 1234 06/15/19 0509  WBC 20.0* 17.5* 17.4*  RBC 2.66* 2.63* 2.59*  HGB 7.9* 7.7* 7.6*  HCT 24.3* 23.7* 23.6*  MCV 91.4 90.1 91.1  MCH 29.7 29.3 29.3  MCHC 32.5 32.5 32.2  RDW 12.6 12.8 12.8  PLT 677* 636* 736*    BNPNo results for input(s): BNP, PROBNP in the last 168 hours.   DDimer No results  for input(s): DDIMER in the last 168 hours.   Radiology    No results found.  Cardiac Studies   Echo 06/13/2019 1. Left ventricular ejection fraction, by visual estimation, is 45 to 50%. The left ventricle has mildly decreased function. Normal left ventricular size. There is no left ventricular hypertrophy. 2. Global right ventricle has normal systolic function.The right ventricular size is normal. No increase in right ventricular wall thickness. 3. Left atrial size was normal. 4. Right atrial size was normal. 5. The mitral valve is normal in structure. Mild mitral valve regurgitation. No evidence of mitral stenosis. 6. The tricuspid valve is normal in structure. Tricuspid valve regurgitation mild-moderate.  7. The aortic valve is normal in structure. Aortic valve regurgitation was not visualized by color flow Doppler. Structurally normal aortic valve, with no evidence of sclerosis or stenosis. 8. The pulmonic valve was normal in structure. Pulmonic valve regurgitation is not visualized by color flow Doppler. 9. Mildly elevated pulmonary artery systolic pressure. 10. The inferior vena cava is normal in size with greater than 50% respiratory variability, suggesting right atrial pressure of 3 mmHg.  Patient Profile     58 y.o. male  with a history of DM2, hypertension, remote history of smoking, and self-reported PAF previously on ASA (discontinued for headache) who was seen for the evaluation of atrial fibrillation and reconsulted for TEE d/t bacteremia to be performed today 9/24 at [facility] per ID request.  Assessment & Plan    Paroxysmal Atrial fibrillation with RVR Maintaining normal sinus rhythm Known history of paroxysmal atrial fibrillation As outpatient feels he is in and out of atrial fibrillation  intermittent shortness of breath/DOE.  --CHA2DS2VASc score of at least 2 (HTN, DM2)  Plan for [facility] at discharge as below with Eliquis 5mg  BID.  -We will order a ZIO at discharge to assess burden of atrial fibrillation   Bacteremia TEE with no valve endocarditis Followed by ID  HFrEF, mild pulmonary hypertension  9/22 EF 40 to 50%.  PASP mildly elevated. -Given lower extremity edema, Hold IV fluids Difficult to determine fluid status given underlying renal failure creatinine 1.5 which is slowly improving and BUN 62, chronically elevated -Once renal function improves to baseline, would consider restarting losartan  Diabetes type 2 Previously on metformin as an outpatient with insulin Would hold metformin in the setting of renal dysfunction    Total encounter time more than 25 minutes  Greater than 50% was spent in counseling and coordination of care with the patient   For questions  or updates, please contact [place] Please consult [email or web address] for contact info under        Signed, Ida Rogue, MD  06/15/2019, 2:59 PM

## 2019-06-15 NOTE — Progress Notes (Signed)
[place] [facility] [place] NAME: Nathan [name]    MR#:  [id number]  DATE OF BIRTH:  26-Jul-1961  SUBJECTIVE:  CHIEF COMPLAINT:   Chief Complaint  Patient presents with  . Wound Infection   Status post incision and drainage of the right hand abscess The patient has no complaints. REVIEW OF SYSTEMS:    ROS  CONSTITUTIONAL: No documented fever. No fatigue, weakness. No weight gain, no weight loss.  EYES: No blurry or double vision.  ENT: No tinnitus. No postnasal drip. No redness of the oropharynx.  RESPIRATORY: No cough, no wheeze, no hemoptysis. No dyspnea.  CARDIOVASCULAR: No chest pain. No orthopnea. Has palpitations. No syncope.  GASTROINTESTINAL: No nausea, no vomiting or diarrhea. No abdominal pain. No melena or hematochezia.  GENITOURINARY: No dysuria or hematuria.  ENDOCRINE: No polyuria or nocturia. No heat or cold intolerance.  HEMATOLOGY: No anemia. No bruising. No bleeding.  INTEGUMENTARY: No rashes. No lesions.  MUSCULOSKELETAL: No arthritis.  No gout.  Right hand bandage noted NEUROLOGIC: No numbness, tingling, or ataxia. No seizure-type activity.  PSYCHIATRIC: No anxiety. No insomnia. No ADD.   DRUG ALLERGIES:  No Known Allergies  VITALS:  Blood pressure 139/79, pulse 82, temperature 98.3 F (36.8 C), resp. rate 18, height 5\' 9"  (1.753 m), weight 68 kg, SpO2 95 %.  PHYSICAL EXAMINATION:   Physical Exam  GENERAL:  58 y.o.-year-old patient lying in the bed with no acute distress.  EYES: Pupils equal, round, reactive to light and accommodation. No scleral icterus. Extraocular muscles intact.  HEENT: Head atraumatic, normocephalic. Oropharynx and nasopharynx clear.  NECK:  Supple, no jugular venous distention. No thyroid enlargement, no tenderness.  LUNGS: Normal breath sounds bilaterally, no wheezing, rales, rhonchi. No use of accessory muscles of respiration.  CARDIOVASCULAR: S1, S2 irregular. No murmurs, rubs, or  gallops.  ABDOMEN: Soft, nontender, nondistended. Bowel sounds present. No organomegaly or mass.  EXTREMITIES: No cyanosis, clubbing or edema b/l.   Right hand bandaged.  NEUROLOGIC: Cranial nerves II through XII are intact. No focal Motor or sensory deficits b/l.   PSYCHIATRIC: The patient is alert and oriented x 3.  SKIN: No jaundice or ulcer.     LABORATORY PANEL:   CBC Recent Labs  Lab 06/15/19 0509  WBC 17.4*  HGB 7.6*  HCT 23.6*  PLT 736*   ------------------------------------------------------------------------------------------------------------------ Chemistries  Recent Labs  Lab 06/11/19 0420  06/15/19 0509  NA 134*   < > 137  K 5.2*   < > 4.3  CL 108   < > 112*  CO2 19*   < > 19*  GLUCOSE 131*   < > 213*  BUN 51*   < > 62*  CREATININE 1.46*   < > 1.52*  CALCIUM 7.5*   < > 7.5*  MG 2.5*  --   --    < > = values in this interval not displayed.   ------------------------------------------------------------------------------------------------------------------  Cardiac Enzymes No results for input(s): TROPONINI in the last 168 hours. ------------------------------------------------------------------------------------------------------------------  RADIOLOGY:  No results found.   ASSESSMENT AND PLAN:    Patient is a 58 year old male with history of hypertension and diabetes mellitus admitted for management of sepsis secondary to right thumb infection and currently under hospitalist service.  1.  Sepsis secondary to right thumb abscess, MRSA bacteremia; leukocytosis. CT of the hand revealed abscess Status post incision and drainage by surgery. Antibiotics narrowed down to IV vancomycin Cefepime discontinued.  Still leukocytosis, follow-up CBC. Change to oral  Zyvox twice daily per Dr. Arlyss Repress. TEE is unremarkable per Dr. Rockey Situ.  2.  Atrial fibrillation with rapid rate He was treated with IV Cardizem drip for rate control Changed to Lopressor  and started Eliquis per Dr. Rockey Situ.  3.  Hypertension Continue hypertension medication.  5.  Diabetes mellitus type 2 Placed on sliding scale insulin coverage.  Added Lantus 10 units daily.  A1c 7.0.  6.  Hyperkalemia Resolved with V[place]assa  Anemia of chronic disease.  Hemoglobin gradually decreased to 7.6.  Follow-up hemoglobin and stool occult. CKD stage III.  Stable.  All the records are reviewed and case discussed with ED provider. Management plans discussed with the patient, family and they are in agreement.  CODE STATUS: Full code All the records are reviewed and case discussed with Care Management/Social Worker. Management plans discussed with the patient, family and they are in agreement.  CODE STATUS: Full code  DVT Prophylaxis: SCDs  TOTAL CRITICAL CARE TIME TAKING CARE OF THIS PATIENT: 27 minutes.   POSSIBLE D/C IN 2 DAYS, DEPENDING ON CLINICAL CONDITION.  Demetrios Loll M.D on 06/15/2019 at 1:41 PM  Between 7am to 6pm - Pager - [phone]  After 6pm go to [email or web address] - password [email or web address] [facility] [facility]  Office  [phone]  CC: Primary care physician; Patient, No Pcp Per  Note: This dictation was prepared with Dragon dictation along with smaller phrase technology. Any transcriptional errors that result from this process are unintentional.

## 2019-06-16 DIAGNOSIS — D649 Anemia, unspecified: Secondary | ICD-10-CM

## 2019-06-16 LAB — CBC
HCT: 25.6 % — ABNORMAL LOW (ref 39.0–52.0)
Hemoglobin: 8.2 g/dL — ABNORMAL LOW (ref 13.0–17.0)
MCH: 29 pg (ref 26.0–34.0)
MCHC: 32 g/dL (ref 30.0–36.0)
MCV: 90.5 fL (ref 80.0–100.0)
Platelets: 737 10*3/uL — ABNORMAL HIGH (ref 150–400)
RBC: 2.83 MIL/uL — ABNORMAL LOW (ref 4.22–5.81)
RDW: 12.4 % (ref 11.5–15.5)
WBC: 16.4 10*3/uL — ABNORMAL HIGH (ref 4.0–10.5)
nRBC: 0 % (ref 0.0–0.2)

## 2019-06-16 LAB — GLUCOSE, CAPILLARY
Glucose-Capillary: 119 mg/dL — ABNORMAL HIGH (ref 70–99)
Glucose-Capillary: 190 mg/dL — ABNORMAL HIGH (ref 70–99)

## 2019-06-16 MED ORDER — APIXABAN 5 MG PO TABS: 5.0000 mg | ORAL_TABLET | Freq: Two times a day (BID) | ORAL | Status: DC

## 2019-06-16 MED ORDER — LINEZOLID 600 MG PO TABS: 600.0000 mg | ORAL_TABLET | Freq: Two times a day (BID) | ORAL | 0 refills | Status: DC

## 2019-06-16 MED ORDER — INSULIN GLARGINE 100 UNIT/ML ~~LOC~~ SOLN: 10.0000 [IU] | Freq: Every day | SUBCUTANEOUS | 11 refills | Status: DC

## 2019-06-16 MED ORDER — OXYCODONE-ACETAMINOPHEN 5-325 MG PO TABS: 1.0000 | ORAL_TABLET | Freq: Four times a day (QID) | ORAL | 0 refills | Status: DC | PRN

## 2019-06-16 MED ORDER — APIXABAN 5 MG PO TABS
5.0000 mg | ORAL_TABLET | Freq: Two times a day (BID) | ORAL | Status: DC
  Administered 2019-06-16: 11:00:00 5 mg via ORAL
  Filled 2019-06-16: qty 1

## 2019-06-16 MED ORDER — METOPROLOL SUCCINATE ER 100 MG PO TB24: 100.0000 mg | ORAL_TABLET | Freq: Every day | ORAL | 0 refills | Status: DC

## 2019-06-16 MED ORDER — MUPIROCIN 2 % EX OINT: 1.0000 "application " | TOPICAL_OINTMENT | Freq: Two times a day (BID) | CUTANEOUS | 0 refills | Status: DC

## 2019-06-16 MED ORDER — METOPROLOL SUCCINATE ER 50 MG PO TB24
100.0000 mg | ORAL_TABLET | Freq: Every day | ORAL | Status: DC
  Administered 2019-06-16: 11:00:00 100 mg via ORAL
  Filled 2019-06-16: qty 2

## 2019-06-16 MED ORDER — METOPROLOL SUCCINATE ER 50 MG PO TB24: 50.0000 mg | ORAL_TABLET | Freq: Every day | ORAL | 11 refills | Status: DC

## 2019-06-16 NOTE — Progress Notes (Signed)
Subjective: 2 Days Post-Op Procedure(s) (LRB): TRANSESOPHAGEAL ECHOCARDIOGRAM (TEE) (N/A) Patient reports minimal pain to the right hand this morning. Patient is well, has no acute complaints to the right hand, he is using it with mild pain while in the splint. Plan is to go Home after hospital stay. Negative for chest pain and shortness of breath Fever: no Gastrointestinal:Negative for nausea and vomiting  Objective: Vital signs in last 24 hours: Temp:  [98 F (36.7 C)-98.4 F (36.9 C)] 98.2 F (36.8 C) (09/26 0404) Pulse Rate:  [82-91] 91 (09/26 0404) Resp:  [16-19] 19 (09/26 0404) BP: (145-158)/(78-80) 152/78 (09/26 0404) SpO2:  [92 %-95 %] 92 % (09/26 0404)  Intake/Output from previous day:  Intake/Output Summary (Last 24 hours) at 06/16/2019 0833 Last data filed at 06/16/2019 0530 Gross per 24 hour  Intake 480 ml  Output -  Net 480 ml    Intake/Output this shift: No intake/output data recorded.  Labs: Recent Labs    06/14/19 1234 06/15/19 0509 06/16/19 0456  HGB 7.7* 7.6* 8.2*   Recent Labs    06/15/19 0509 06/16/19 0456  WBC 17.4* 16.4*  RBC 2.59* 2.83*  HCT 23.6* 25.6*  PLT 736* 737*   Recent Labs    06/14/19 0459 06/15/19 0509  NA 134* 137  K 4.5 4.3  CL 109 112*  CO2 18* 19*  BUN 73* 62*  CREATININE 1.84* 1.52*  GLUCOSE 199* 213*  CALCIUM 7.5* 7.5*   No results for input(s): LABPT, INR in the last 72 hours.   EXAM General - Patient is Alert, Appropriate and Oriented Extremity - Right hand dressing was removed this AM.  Granulation over the dorsal aspect of the thumb.  No significant swelling noted to the right hand, including over the dorsal and volar aspect of the hand, no erythema to the palm of the hand.  Both the dorsal and voral vessel loops were removed.  New bulky dressing was applied to the right hand.  Able to flex and extend all fingers without pain, intact to light touch.  Cap refill intact to each digit.          Past  Medical History:  Diagnosis Date  . Diabetes mellitus without complication (HCC)    Assessment/Plan: 2 Days Post-Op Procedure(s) (LRB): TRANSESOPHAGEAL ECHOCARDIOGRAM (TEE) (N/A) Active Problems:   Sepsis ([place]) Anemia due to acute blood loss  Estimated body mass index is 22.15 kg/m as calculated from the following:   Height as of this encounter: 5\' 9"  (1.753 m).   Weight as of this encounter: 68 kg. Advance diet Up with therapy D/C IV fluids when tolerating po intake.  Patient is currently on Linezolid. Hg up to 8.2 this morning, WBC 16.4 Cultures are growing rare staphylococcus aureus Upon discharge from the hospital follow-up with [place] next week for skin check and possible suture removal.  DVT Prophylaxis - heparin Non-weightbearing to the right hand.  Nathan [name] , PA-C [facility] 06/16/2019, 8:33 AM

## 2019-06-16 NOTE — Progress Notes (Addendum)
Progress Note  Patient Name: Nathan [name] Date of Encounter: 06/16/2019  Primary Cardiologist: New to [facility]. Can follow up with Dr. Garen Lah  Subjective   Patient states feeling well today, denies any chest pain shortness of breath or palpitations.  He is not on remote telemetry.  Inpatient Medications    Scheduled Meds: . heparin  5,000 Units Subcutaneous Q8H  . insulin aspart  0-5 Units Subcutaneous QHS  . insulin aspart  0-9 Units Subcutaneous TID WC  . insulin glargine  10 Units Subcutaneous Daily  . linezolid  600 mg Oral Q12H  . metoprolol tartrate  25 mg Oral Q6H  . mupirocin ointment  1 application Nasal BID  . pantoprazole  40 mg Oral BID  . senna  1 tablet Oral BID   Continuous Infusions: . sodium chloride Stopped (06/13/19 0130)   PRN Meds: acetaminophen, bisacodyl, HYDROmorphone (DILAUDID) injection, magnesium citrate, magnesium hydroxide, oxyCODONE, oxyCODONE, phenol   Vital Signs    Vitals:   06/15/19 0402 06/15/19 1514 06/15/19 2003 06/16/19 0404  BP: 139/79 (!) 158/80 (!) 145/78 (!) 152/78  Pulse: 82 85 82 91  Resp: 18 18 16 19   Temp: 98.3 F (36.8 C) 98 F (36.7 C) 98.4 F (36.9 C) 98.2 F (36.8 C)  TempSrc:  Oral Oral   SpO2: 95% 95% 95% 92%  Weight:      Height:        Intake/Output Summary (Last 24 hours) at 06/16/2019 0926 Last data filed at 06/16/2019 0530 Gross per 24 hour  Intake 480 ml  Output -  Net 480 ml   Last 3 Weights 06/10/2019  Weight (lbs) 150 lb  Weight (kg) 68.04 kg      Telemetry    Not on telemetry.  ECG    None obtained today.  Physical Exam   GEN: No acute distress.   Neck: No JVD Cardiac: RRR, no murmurs, rubs, or gallops.  Respiratory: Clear to auscultation bilaterally. GI: Soft, nontender, non-distended  MS: No edema; No deformity. Neuro:  Nonfocal  Psych: Normal affect   Labs    High Sensitivity Troponin:  No results for input(s): TROPONINIHS in the last 720 hours.    Chemistry  Recent Labs  Lab 06/13/19 0400 06/14/19 0459 06/15/19 0509  NA 135 134* 137  K 4.7 4.5 4.3  CL 110 109 112*  CO2 17* 18* 19*  GLUCOSE 244* 199* 213*  BUN 63* 73* 62*  CREATININE 1.94* 1.84* 1.52*  CALCIUM 7.7* 7.5* 7.5*  GFRNONAA 38* 40* 50*  GFRAA 44* 46* 59*  ANIONGAP 8 7 6      Hematology Recent Labs  Lab 06/14/19 1234 06/15/19 0509 06/16/19 0456  WBC 17.5* 17.4* 16.4*  RBC 2.63* 2.59* 2.83*  HGB 7.7* 7.6* 8.2*  HCT 23.7* 23.6* 25.6*  MCV 90.1 91.1 90.5  MCH 29.3 29.3 29.0  MCHC 32.5 32.2 32.0  RDW 12.8 12.8 12.4  PLT 636* 736* 737*    BNPNo results for input(s): BNP, PROBNP in the last 168 hours.   DDimer No results for input(s): DDIMER in the last 168 hours.   Radiology    No results found.  Cardiac Studies   Echo 06/13/2019 1. Left ventricular ejection fraction, by visual estimation, is 45 to 50%. The left ventricle has mildly decreased function. Normal left ventricular size. There is no left ventricular hypertrophy. 2. Global right ventricle has normal systolic function.The right ventricular size is normal. No increase in right ventricular wall thickness. 3. Left atrial size  was normal. 4. Right atrial size was normal. 5. The mitral valve is normal in structure. Mild mitral valve regurgitation. No evidence of mitral stenosis. 6. The tricuspid valve is normal in structure. Tricuspid valve regurgitation mild-moderate. 7. The aortic valve is normal in structure. Aortic valve regurgitation was not visualized by color flow Doppler. Structurally normal aortic valve, with no evidence of sclerosis or stenosis. 8. The pulmonic valve was normal in structure. Pulmonic valve regurgitation is not visualized by color flow Doppler. 9. Mildly elevated pulmonary artery systolic pressure. 10. The inferior vena cava is normal in size with greater than 50% respiratory variability, suggesting right atrial pressure of 3 mm  Patient Profile     58 y.o. male  hypertension, diabetes, paroxysmal A. fib x17 years who presented to the hospital due to a right hand infection.  He was noted to be in A. fib RVR.  He was managed with IV Cardizem heparin drip.  Ejection fraction was 45 to 50%.  Cardizem drip was switched to a beta-blocker.  He is currently in sinus rhythm.  Assessment & Plan    1. Paroxysmal atrial fibrillation -Chadsvasc 2 -Toprol 100 mg daily -zio on discharge to monitor afib burden -Recommend Eliquis 5 mg twice daily on discharge.  2.  Skin infection, bacteremia -TEE with no evidence for endocarditis -Management as per primary team.  3.  Anemia -Patient states having a long history of anemia -Denies any blood in stool, hematuria -Hemoglobin seems to be improving    For questions or updates, please contact [facility] Please consult [email or web address] for contact info under        Signed, Kate Sable, MD  06/16/2019, 9:26 AM

## 2019-06-16 NOTE — Discharge Summary (Signed)
Nathan [name], is a 58 y.o. male  DOB [birth year]  MRN [id number].  Admission date:  06/10/2019  Admitting Physician  Jude Stark Jock, MD  Discharge Date:  06/16/2019   Primary MD  Patient, No Pcp Per  Recommendations for primary care physician for things to follow:   Advised to follow-up with case ER evaluation 3 to 4 days regarding socially modified right hand abscess drainage Follow-up with Dr. Steva Ready in 1 week regarding labs including CBC, BMP.. Admission Diagnosis  Cellulitis of right upper extremity [L03.113]   Discharge Diagnosis  Cellulitis of right upper extremity [L03.113]   Active Problems:   Sepsis [facility])      Past Medical History:  Diagnosis Date  . Diabetes mellitus without complication [facility])     Past Surgical History:  Procedure Laterality Date  . I&D EXTREMITY Right 06/11/2019   Procedure: IRRIGATION AND DEBRIDEMENT RIGHT THUMB;  Surgeon: Dereck Leep, MD;  Location: [facility] ORS;  Service: Orthopedics;  Laterality: Right;  . TEE WITHOUT CARDIOVERSION N/A 06/14/2019   Procedure: TRANSESOPHAGEAL ECHOCARDIOGRAM (TEE);  Surgeon: Minna Merritts, MD;  Location: [facility] ORS;  Service: Cardiovascular;  Laterality: N/A;       History of present illness and  Hospital Course:     Kindly see H&P for history of present illness and admission details, please review complete Labs, Consult reports and Test reports for all details in brief  HPI  from the history and physical done on the day of admission 58 year old male patient with history of diet-controlled diabetes mellitus type 2, hypertension comes in to ER because of infected right thumb.  Patient was taking Augmentin before he came to hospital but without improvement noted to have increased swelling of right thumb with purulent drainage, admitted for the  same.  Hospital Course  #1 sepsis present on admission secondary to right thumb infection, initially received IV vancomycin, cefepime along with IV fluids, orthopedic is consulted for incision and drainage.  X-ray of the right thumb did not show any fracture, dislocation. ct Scan of right thumb showed extensive abscess in the palm extending to the dorsal aspect of the base of the thumb, no evidence of osteomyelitis.  Seen by orthopedic physician, status post incision and drainage of right thumb, postoperatively patient is feeling much better, also had decreased leukocytosis, seen by orthopedic, dressing changes for the right hand today, patient can see [facility] orthopedic in 1 week regarding suture removal. 2.  MRSA bacteremia secondary to right thumb abscess status post drainage, initial white count is elevated to 29 decreased to 16 today, seen by Dr. Levester Fresh recommended to discharge him with Zyvox 600 mg p.o. twice daily for 14 days patient needs weekly labs.  Repeat blood cultures are negative.  Patient TEE did not show any vegetations.  Seen by cardiology. 3.  AKI on CKD stage III, acute kidney injury secondary to sepsis, contrast nephropathy, improved .  Continue trended down from 1.94-1.52. #4 anemia of chronic kidney disease, patient is to follow-up with PCP he says he has PCP but I do not see any in the list, needs to have set up with PCP to monitor his diabetes, chronic kidney disease, ongoing diabetes management. 5.. Permanent atrial fibrillation, patient has history of proximal defibrillation, was taking aspirin but discontinued due to side effects of headache, but here seen by [facility] health cardiology, recommended Eliquis 5 mg p.o. twice daily, patient was started on Lopressor but changed to Toprol-XL 100 mg mg daily from now on.  6.  Diabetes mellitus type 2, patient told me he is not taking any medicines at home started on Levemir here along with sliding scale insulin with coverage, discharged home  with Levemir, patient already has a glucometer at home advised him to check blood sugars, 7.  Essential hypertension, heart failure with reduced ejection fraction, mild pulmonary hypertension, seen by [facility] health cardiology, ejection fraction 40 to 50%, patient is to follow-up with [facility] health cardiology regarding starting losartan and ACE inhibitors.  Discontinue losartan at discharge secondary to increased creatinine.    Discharge Condition: Stable   Follow UP  Follow-up Information    Lovell Sheehan, MD. Schedule an appointment as soon as possible for a visit in 3 day(s).   Specialty: Orthopedic Surgery Why: Needs to follow-up with [facility]o next 2 3 days regarding suture removal, dressing changes for the right hand Contact information: [place] [address] [phone]        Tsosie Billing, MD. Schedule an appointment as soon as possible for a visit in 4 day(s).   Specialty: Infectious Diseases Why: Needs to follow-up with Dr. Evalee Mutton regarding CBC, Chem-7 Contact information: [place] [address] [phone]             Discharge Instructions  and  Discharge Medications   Hold HCTZ, losartan until seen as a follow-up with cardiologist   Allergies as of 06/16/2019   No Known Allergies     Medication List    STOP taking these medications   amoxicillin-clavulanate 875-125 MG tablet Commonly known as: AUGMENTIN   hydrochlorothiazide 12.5 MG tablet Commonly known as: HYDRODIURIL     TAKE these medications   apixaban 5 MG Tabs tablet Commonly known as: ELIQUIS Take 1 tablet (5 mg total) by mouth 2 (two) times daily.   insulin glargine 100 UNIT/ML injection Commonly known as: LANTUS Inject 0.1 mLs (10 Units total) into the skin daily.   linezolid 600 MG tablet Commonly known as: ZYVOX Take 1 tablet (600 mg total) by mouth every 12 (twelve) hours.   losartan 25 MG tablet Commonly known as: COZAAR Take  25 mg by mouth daily.   metoprolol succinate 50 MG 24 hr tablet Commonly known as: Toprol XL Take 1 tablet (50 mg total) by mouth daily. Take with or immediately following a meal.   mupirocin ointment 2 % Commonly known as: BACTROBAN Place 1 application into the nose 2 (two) times daily.         Diet and Activity recommendation: See Discharge Instructions above   Consults obtained -cardiology, orthopedic, ID   Major procedures and Radiology Reports - PLEASE review detailed and final reports for all details, in brief -      Ct Hand Right W Contrast  Result Date: 06/11/2019 CLINICAL DATA:  Sepsis. Right hand swelling. EXAM: CT OF THE UPPER RIGHT EXTREMITY WITH CONTRAST TECHNIQUE: Multidetector CT imaging of the upper right extremity was performed according to the standard protocol following intravenous contrast administration. COMPARISON:  Radiographs dated 06/10/2019 CONTRAST:  128mL OMNIPAQUE IOHEXOL 300 MG/ML  SOLN FINDINGS: Muscles and Tendons and soft tissues There is an extensive abscess extending from the dorsal aspect of the base of the thumb at the site of the soft tissue ulcer near the base of the proximal phalanx. The abscess extends into the palm involving the abductor pollicis brevis and adductor pollicis muscles extending across the palm extending to the volar surface of the head of the third metacarpal best  seen on image 85 of series 6. The abscess is at least 6.3 x 3.4 x 2.3 cm. The mass is deep to the flexor tendons of first, second and third digits at the level of the metacarpals. Bones/Joint/Cartilage No evidence of osteomyelitis or other acute abnormality. IMPRESSION: 1. Extensive abscess in the palm extending from the dorsal aspect of the base of the thumb to the volar surface of the head of the third metacarpal. 2. No evidence of osteomyelitis. Electronically Signed   By: Lorriane Shire M.D.   On: 06/11/2019 10:15   Dg Hand Complete Right  Result Date:  06/10/2019 CLINICAL DATA:  Infection in right thumb EXAM: RIGHT HAND - COMPLETE 3+ VIEW COMPARISON:  None. FINDINGS: No fracture or dislocation of the right hand. Joint spaces are well preserved. Soft tissue edema about the right thumb. IMPRESSION: Soft tissue edema about the right thumb. No fracture, dislocation, or other osseous abnormality. Electronically Signed   By: Eddie Candle M.D.   On: 06/10/2019 17:14    Micro Results     Recent Results (from the past 240 hour(s))  Blood culture (routine x 2)     Status: None   Collection Time: 06/10/19  4:32 PM   Specimen: BLOOD  Result Value Ref Range Status   Specimen Description BLOOD LEFT ANTECUBITAL  Final   Special Requests   Final    BOTTLES DRAWN AEROBIC AND ANAEROBIC Blood Culture results may not be optimal due to an inadequate volume of blood received in culture bottles   Culture   Final    NO GROWTH 5 DAYS Performed at [facility], [name], [place], [place] [address]    Report Status 06/15/2019 FINAL  Final  Blood culture (routine x 2)     Status: Abnormal   Collection Time: 06/10/19  5:12 PM   Specimen: BLOOD  Result Value Ref Range Status   Specimen Description   Final    BLOOD RT FA Performed at [facility], [address], [place], [place] [address]    Special Requests   Final    BOTTLES DRAWN AEROBIC AND ANAEROBIC Blood Culture adequate volume Performed at [facility], [address], [place], [place] [address]    Culture  Setup Time   Final    GRAM POSITIVE COCCI ANAEROBIC BOTTLE ONLY CRITICAL RESULT CALLED TO, READ BACK BY AND VERIFIED WITH: Rito Ehrlich 06/11/2019 1808 KMP Performed at [facility], [place] [address] [place], [place] [address]    Culture METHICILLIN RESISTANT STAPHYLOCOCCUS AUREUS (A)  Final   Report Status 06/14/2019 FINAL  Final   Organism ID, Bacteria METHICILLIN RESISTANT STAPHYLOCOCCUS AUREUS  Final      Susceptibility   Methicillin resistant  staphylococcus aureus - MIC*    CIPROFLOXACIN >=8 RESISTANT Resistant     ERYTHROMYCIN >=8 RESISTANT Resistant     GENTAMICIN <=0.5 SENSITIVE Sensitive     OXACILLIN >=4 RESISTANT Resistant     TETRACYCLINE <=1 SENSITIVE Sensitive     VANCOMYCIN <=0.5 SENSITIVE Sensitive     TRIMETH/SULFA <=10 SENSITIVE Sensitive     CLINDAMYCIN <=0.25 SENSITIVE Sensitive     RIFAMPIN <=0.5 SENSITIVE Sensitive     Inducible Clindamycin NEGATIVE Sensitive     * METHICILLIN RESISTANT STAPHYLOCOCCUS AUREUS  SARS CORONAVIRUS 2 (TAT 6-24 HRS) Nasopharyngeal Nasopharyngeal Swab     Status: None   Collection Time: 06/10/19  5:12 PM   Specimen: Nasopharyngeal Swab  Result Value Ref Range Status   SARS  Coronavirus 2 NEGATIVE NEGATIVE Final    Comment: (NOTE) SARS-CoV-2 target nucleic acids are NOT DETECTED. The SARS-CoV-2 RNA is generally detectable in upper and lower respiratory specimens during the acute phase of infection. Negative results do not preclude SARS-CoV-2 infection, do not rule out co-infections with other pathogens, and should not be used as the sole basis for treatment or other patient management decisions. Negative results must be combined with clinical observations, patient history, and epidemiological information. The expected result is Negative. Fact Sheet for Patients: SugarRoll.be Fact Sheet for Healthcare Providers: https://www.woods-mathews.com/ This test is not yet approved or cleared by the [place] FDA and  has been authorized for detection and/or diagnosis of SARS-CoV-2 by FDA under an Emergency Use Authorization (EUA). This EUA will remain  in effect (meaning this test can be used) for the duration of the COVID-19 declaration under Section 56 4(b)(1) of the Act, 21 U.S.C. section 360bbb-3(b)(1), unless the authorization is terminated or revoked sooner. Performed at [facility], [place] [address] [place], [place] [address]    Blood Culture ID Panel (Reflexed)     Status: Abnormal   Collection Time: 06/10/19  5:12 PM  Result Value Ref Range Status   Enterococcus species NOT DETECTED NOT DETECTED Final   Listeria monocytogenes NOT DETECTED NOT DETECTED Final   Staphylococcus species DETECTED (A) NOT DETECTED Final    Comment: CRITICAL RESULT CALLED TO, READ BACK BY AND VERIFIED WITH: WALID NAZARI ON 06/11/2019 AT 1608 KMP    Staphylococcus aureus (BCID) DETECTED (A) NOT DETECTED Final    Comment: Methicillin (oxacillin)-resistant Staphylococcus aureus (MRSA). MRSA is predictably resistant to beta-lactam antibiotics (except ceftaroline). Preferred therapy is vancomycin unless clinically contraindicated. Patient requires contact precautions if  hospitalized. CRITICAL RESULT CALLED TO, READ BACK BY AND VERIFIED WITH: WALID NAZARI ON 06/11/2019 AT 1608 KMP    Methicillin resistance DETECTED (A) NOT DETECTED Final    Comment: CRITICAL RESULT CALLED TO, READ BACK BY AND VERIFIED WITH: WALID NAZARI ON 06/11/2019 AT 1608 KMP    Streptococcus species NOT DETECTED NOT DETECTED Final   Streptococcus agalactiae NOT DETECTED NOT DETECTED Final   Streptococcus pneumoniae NOT DETECTED NOT DETECTED Final   Streptococcus pyogenes NOT DETECTED NOT DETECTED Final   Acinetobacter baumannii NOT DETECTED NOT DETECTED Final   Enterobacteriaceae species NOT DETECTED NOT DETECTED Final   Enterobacter cloacae complex NOT DETECTED NOT DETECTED Final   Escherichia coli NOT DETECTED NOT DETECTED Final   Klebsiella oxytoca NOT DETECTED NOT DETECTED Final   Klebsiella pneumoniae NOT DETECTED NOT DETECTED Final   Proteus species NOT DETECTED NOT DETECTED Final   Serratia marcescens NOT DETECTED NOT DETECTED Final   Haemophilus influenzae NOT DETECTED NOT DETECTED Final   Neisseria meningitidis NOT DETECTED NOT DETECTED Final   Pseudomonas aeruginosa NOT DETECTED NOT DETECTED Final   Candida albicans NOT DETECTED NOT DETECTED Final    Candida glabrata NOT DETECTED NOT DETECTED Final   Candida krusei NOT DETECTED NOT DETECTED Final   Candida parapsilosis NOT DETECTED NOT DETECTED Final   Candida tropicalis NOT DETECTED NOT DETECTED Final    Comment: Performed at [facility], [address], [place], [place] [address]  Surgical pcr screen     Status: Abnormal   Collection Time: 06/11/19  8:21 PM   Specimen: Nasal Mucosa; Nasal Swab  Result Value Ref Range Status   MRSA, PCR POSITIVE (A) NEGATIVE Final    Comment: RESULT CALLED TO, READ BACK BY AND VERIFIED WITH: Janeann Merl ON  06/11/2019 AT 2148 QSD    Staphylococcus aureus POSITIVE (A) NEGATIVE Final    Comment: (NOTE) The Xpert SA Assay (FDA approved for NASAL specimens in patients 33 years of age and older), is one component of a comprehensive surveillance program. It is not intended to diagnose infection nor to guide or monitor treatment. Performed at [facility], [address] [place], [place] [address]   Aerobic/Anaerobic Culture (surgical/deep wound)     Status: None (Preliminary result)   Collection Time: 06/11/19 10:30 PM   Specimen: PATH Other; Wound  Result Value Ref Range Status   Specimen Description   Final    HAND RIGHT DORSAL INCISION Performed at [facility], [address] [place], [place] [address]    Special Requests   Final    PATIENT ON FOLLOWING CEFEPIME VANCOMYCIN Performed at [facility], [place] [address], [place], [place] [address]    Gram Stain   Final    NO ORGANISMS SEEN RARE WBC MANY RBC Performed at [facility], [address] [place], [place]  [address]    Culture   Final    RARE METHICILLIN RESISTANT STAPHYLOCOCCUS AUREUS NO ANAEROBES ISOLATED; CULTURE IN PROGRESS FOR 5 DAYS    Report Status PENDING  Incomplete   Organism ID, Bacteria METHICILLIN RESISTANT STAPHYLOCOCCUS AUREUS  Final      Susceptibility   Methicillin resistant staphylococcus aureus - MIC*     CIPROFLOXACIN >=8 RESISTANT Resistant     ERYTHROMYCIN >=8 RESISTANT Resistant     GENTAMICIN <=0.5 SENSITIVE Sensitive     OXACILLIN >=4 RESISTANT Resistant     TETRACYCLINE <=1 SENSITIVE Sensitive     VANCOMYCIN <=0.5 SENSITIVE Sensitive     TRIMETH/SULFA <=10 SENSITIVE Sensitive     CLINDAMYCIN <=0.25 SENSITIVE Sensitive     RIFAMPIN <=0.5 SENSITIVE Sensitive     Inducible Clindamycin NEGATIVE Sensitive     * RARE METHICILLIN RESISTANT STAPHYLOCOCCUS AUREUS  Aerobic/Anaerobic Culture (surgical/deep wound)     Status: None (Preliminary result)   Collection Time: 06/11/19 10:33 PM   Specimen: PATH Other; Wound  Result Value Ref Range Status   Specimen Description   Final    HAND Performed at [facility], [address] [address] [place], [place] [address]    Special Requests   Final    RIGHT HAND DORSAL INCISION 2 PATIENT ON FOLLOWING CEFEPIME VANCOMYCIN Performed at [facility], [place] [address], [place], [place] [address]    Gram Stain   Final    NO ORGANISMS SEEN MANY RBC FEW WBC Performed at [facility], [address] [place], [place] [address]    Culture   Final    FEW METHICILLIN RESISTANT STAPHYLOCOCCUS AUREUS NO ANAEROBES ISOLATED; CULTURE IN PROGRESS FOR 5 DAYS    Report Status PENDING  Incomplete   Organism ID, Bacteria METHICILLIN RESISTANT STAPHYLOCOCCUS AUREUS  Final      Susceptibility   Methicillin resistant staphylococcus aureus - MIC*    CIPROFLOXACIN >=8 RESISTANT Resistant     ERYTHROMYCIN >=8 RESISTANT Resistant     GENTAMICIN <=0.5 SENSITIVE Sensitive     OXACILLIN >=4 RESISTANT Resistant     TETRACYCLINE <=1 SENSITIVE Sensitive     VANCOMYCIN <=0.5 SENSITIVE Sensitive     TRIMETH/SULFA <=10 SENSITIVE Sensitive     CLINDAMYCIN <=0.25 SENSITIVE Sensitive     RIFAMPIN <=0.5 SENSITIVE Sensitive     Inducible Clindamycin NEGATIVE Sensitive     * FEW METHICILLIN RESISTANT STAPHYLOCOCCUS AUREUS  Aerobic/Anaerobic  Culture  (surgical/deep wound)     Status: None (Preliminary result)   Collection Time: 06/11/19 10:53 PM   Specimen: PATH Other; Wound  Result Value Ref Range Status   Specimen Description   Final    HAND Performed at [facility], [address] [place], [place] [address]    Special Requests   Final    RIGHT HAND LOWER INCISION PATIENT ON FOLLOWING CEFEPIME VANCOMYCIN Performed at [facility], [place] [address] [place], [place] [address]    Gram Stain   Final    NO ORGANISMS SEEN FEW WBC MODERATE RBC Performed at [facility], [address] [place], [place] [address]    Culture   Final    RARE STAPHYLOCOCCUS AUREUS SUSCEPTIBILITIES PERFORMED ON PREVIOUS CULTURE WITHIN THE LAST 5 DAYS. NO ANAEROBES ISOLATED; CULTURE IN PROGRESS FOR 5 DAYS    Report Status PENDING  Incomplete  Culture, blood (Routine X 2) w Reflex to ID Panel     Status: None (Preliminary result)   Collection Time: 06/12/19  9:03 AM   Specimen: BLOOD LEFT HAND  Result Value Ref Range Status   Specimen Description BLOOD LEFT HAND  Final   Special Requests   Final    BOTTLES DRAWN AEROBIC AND ANAEROBIC Blood Culture results may not be optimal due to an inadequate volume of blood received in culture bottles   Culture   Final    NO GROWTH 4 DAYS Performed at [facility], [address], [place], [place] [address]    Report Status PENDING  Incomplete  Culture, blood (Routine X 2) w Reflex to ID Panel     Status: None (Preliminary result)   Collection Time: 06/12/19  9:03 AM   Specimen: BLOOD  Result Value Ref Range Status   Specimen Description BLOOD LEFT ARM  Final   Special Requests   Final    BOTTLES DRAWN AEROBIC AND ANAEROBIC Blood Culture adequate volume   Culture   Final    NO GROWTH 4 DAYS Performed at [facility], [address], [place], [place] [address]    Report Status PENDING  Incomplete       Today   Subjective:   Hamin Chahal today has no  headache,no chest abdominal pain,no new weakness tingling or numbness, feels much better wants to go home today.  Objective:   Blood pressure (!) 152/78, pulse 91, temperature 98.2 F (36.8 C), resp. rate 19, height 5\' 9"  (1.753 m), weight 68 kg, SpO2 92 %.   Intake/Output Summary (Last 24 hours) at 06/16/2019 0934 Last data filed at 06/16/2019 0530 Gross per 24 hour  Intake 480 ml  Output -  Net 480 ml    Exam Awake Alert, Oriented x 3, No new F.N deficits, Normal affect .AT,PERRAL Supple Neck,No JVD, No cervical lymphadenopathy appriciated.  Symmetrical Chest wall movement, Good air movement bilaterally, CTAB RRR,No Gallops,Rubs or new Murmurs, No Parasternal Heave +ve B.Sounds, Abd Soft, Non tender, No organomegaly appriciated, No rebound -guarding or rigidity. No Cyanosis, dressing present for the right hand   data Review   CBC w Diff:  Lab Results  Component Value Date   WBC 16.4 (H) 06/16/2019   HGB 8.2 (L) 06/16/2019   HCT 25.6 (L) 06/16/2019   PLT 737 (H) 06/16/2019   LYMPHOPCT 2 06/10/2019   MONOPCT 9 06/10/2019   EOSPCT 1 06/10/2019   BASOPCT 0 06/10/2019    CMP:  Lab Results  Component Value Date  NA 137 06/15/2019   K 4.3 06/15/2019   CL 112 (H) 06/15/2019   CO2 19 (L) 06/15/2019   BUN 62 (H) 06/15/2019   CREATININE 1.52 (H) 06/15/2019  .   Total Time in preparing paper work, data evaluation and todays exam - 35 minutes  Epifanio Lesches M.D on 06/16/2019 at 9:34 AM    Note: This dictation was prepared with Dragon dictation along with smaller phrase technology. Any transcriptional errors that result from this process are unintentional.

## 2019-06-17 LAB — AEROBIC/ANAEROBIC CULTURE W GRAM STAIN (SURGICAL/DEEP WOUND)
Gram Stain: NONE SEEN
Gram Stain: NONE SEEN
Gram Stain: NONE SEEN

## 2019-06-17 LAB — CULTURE, BLOOD (ROUTINE X 2)
Culture: NO GROWTH
Culture: NO GROWTH
Special Requests: ADEQUATE

## 2019-06-18 LAB — MULTIPLE MYELOMA PANEL, SERUM
Albumin SerPl Elph-Mcnc: 1.9 g/dL — ABNORMAL LOW (ref 2.9–4.4)
Albumin/Glob SerPl: 0.7 (ref 0.7–1.7)
Alpha 1: 0.4 g/dL (ref 0.0–0.4)
Alpha2 Glob SerPl Elph-Mcnc: 0.9 g/dL (ref 0.4–1.0)
B-Globulin SerPl Elph-Mcnc: 0.6 g/dL — ABNORMAL LOW (ref 0.7–1.3)
Gamma Glob SerPl Elph-Mcnc: 0.9 g/dL (ref 0.4–1.8)
Globulin, Total: 2.9 g/dL (ref 2.2–3.9)
IgA: 212 mg/dL (ref 90–386)
IgG (Immunoglobin G), Serum: 939 mg/dL (ref 603–1613)
IgM (Immunoglobulin M), Srm: 52 mg/dL (ref 20–172)
Total Protein ELP: 4.8 g/dL — ABNORMAL LOW (ref 6.0–8.5)

## 2019-06-19 ENCOUNTER — Encounter: Payer: Self-pay | Admitting: Intensive Care

## 2019-06-19 ENCOUNTER — Inpatient Hospital Stay
Admission: EM | Admit: 2019-06-19 | Discharge: 2019-06-24 | DRG: 871 | Disposition: A | Discharge status: Other Acute Inpatient Hospital | Payer: Self-pay | Attending: Internal Medicine | Admitting: Internal Medicine

## 2019-06-19 ENCOUNTER — Emergency Department: Payer: Self-pay

## 2019-06-19 ENCOUNTER — Other Ambulatory Visit: Payer: Self-pay

## 2019-06-19 DIAGNOSIS — I429 Cardiomyopathy, unspecified: Secondary | ICD-10-CM | POA: Diagnosis present

## 2019-06-19 DIAGNOSIS — Z20828 Contact with and (suspected) exposure to other viral communicable diseases: Secondary | ICD-10-CM | POA: Diagnosis present

## 2019-06-19 DIAGNOSIS — E46 Unspecified protein-calorie malnutrition: Secondary | ICD-10-CM | POA: Diagnosis present

## 2019-06-19 DIAGNOSIS — Z7982 Long term (current) use of aspirin: Secondary | ICD-10-CM

## 2019-06-19 DIAGNOSIS — Y95 Nosocomial condition: Secondary | ICD-10-CM | POA: Diagnosis present

## 2019-06-19 DIAGNOSIS — I480 Paroxysmal atrial fibrillation: Secondary | ICD-10-CM | POA: Diagnosis present

## 2019-06-19 DIAGNOSIS — E875 Hyperkalemia: Secondary | ICD-10-CM | POA: Diagnosis present

## 2019-06-19 DIAGNOSIS — I5043 Acute on chronic combined systolic (congestive) and diastolic (congestive) heart failure: Secondary | ICD-10-CM | POA: Diagnosis present

## 2019-06-19 DIAGNOSIS — Z8614 Personal history of Methicillin resistant Staphylococcus aureus infection: Secondary | ICD-10-CM

## 2019-06-19 DIAGNOSIS — J90 Pleural effusion, not elsewhere classified: Secondary | ICD-10-CM

## 2019-06-19 DIAGNOSIS — Z833 Family history of diabetes mellitus: Secondary | ICD-10-CM

## 2019-06-19 DIAGNOSIS — R0602 Shortness of breath: Secondary | ICD-10-CM

## 2019-06-19 DIAGNOSIS — Z23 Encounter for immunization: Secondary | ICD-10-CM

## 2019-06-19 DIAGNOSIS — D638 Anemia in other chronic diseases classified elsewhere: Secondary | ICD-10-CM | POA: Diagnosis present

## 2019-06-19 DIAGNOSIS — Z87891 Personal history of nicotine dependence: Secondary | ICD-10-CM

## 2019-06-19 DIAGNOSIS — E1122 Type 2 diabetes mellitus with diabetic chronic kidney disease: Secondary | ICD-10-CM | POA: Diagnosis present

## 2019-06-19 DIAGNOSIS — A4102 Sepsis due to Methicillin resistant Staphylococcus aureus: Principal | ICD-10-CM | POA: Diagnosis present

## 2019-06-19 DIAGNOSIS — R601 Generalized edema: Secondary | ICD-10-CM

## 2019-06-19 DIAGNOSIS — L03113 Cellulitis of right upper limb: Secondary | ICD-10-CM | POA: Diagnosis present

## 2019-06-19 DIAGNOSIS — I130 Hypertensive heart and chronic kidney disease with heart failure and stage 1 through stage 4 chronic kidney disease, or unspecified chronic kidney disease: Secondary | ICD-10-CM | POA: Diagnosis present

## 2019-06-19 DIAGNOSIS — I509 Heart failure, unspecified: Secondary | ICD-10-CM

## 2019-06-19 DIAGNOSIS — Z79899 Other long term (current) drug therapy: Secondary | ICD-10-CM

## 2019-06-19 DIAGNOSIS — N170 Acute kidney failure with tubular necrosis: Secondary | ICD-10-CM | POA: Diagnosis not present

## 2019-06-19 DIAGNOSIS — Z8249 Family history of ischemic heart disease and other diseases of the circulatory system: Secondary | ICD-10-CM

## 2019-06-19 DIAGNOSIS — L02511 Cutaneous abscess of right hand: Secondary | ICD-10-CM | POA: Diagnosis present

## 2019-06-19 DIAGNOSIS — Z79891 Long term (current) use of opiate analgesic: Secondary | ICD-10-CM

## 2019-06-19 DIAGNOSIS — J189 Pneumonia, unspecified organism: Secondary | ICD-10-CM | POA: Diagnosis present

## 2019-06-19 DIAGNOSIS — J869 Pyothorax without fistula: Secondary | ICD-10-CM

## 2019-06-19 DIAGNOSIS — Z7901 Long term (current) use of anticoagulants: Secondary | ICD-10-CM

## 2019-06-19 DIAGNOSIS — Z6824 Body mass index (BMI) 24.0-24.9, adult: Secondary | ICD-10-CM

## 2019-06-19 DIAGNOSIS — I313 Pericardial effusion (noninflammatory): Secondary | ICD-10-CM | POA: Diagnosis not present

## 2019-06-19 DIAGNOSIS — A419 Sepsis, unspecified organism: Secondary | ICD-10-CM | POA: Diagnosis present

## 2019-06-19 DIAGNOSIS — N5089 Other specified disorders of the male genital organs: Secondary | ICD-10-CM | POA: Diagnosis present

## 2019-06-19 DIAGNOSIS — Z794 Long term (current) use of insulin: Secondary | ICD-10-CM

## 2019-06-19 DIAGNOSIS — N183 Chronic kidney disease, stage 3 unspecified: Secondary | ICD-10-CM

## 2019-06-19 HISTORY — DX: Essential (primary) hypertension: I10

## 2019-06-19 LAB — BASIC METABOLIC PANEL
Anion gap: 7 (ref 5–15)
BUN: 52 mg/dL — ABNORMAL HIGH (ref 6–20)
CO2: 19 mmol/L — ABNORMAL LOW (ref 22–32)
Calcium: 8.4 mg/dL — ABNORMAL LOW (ref 8.9–10.3)
Chloride: 114 mmol/L — ABNORMAL HIGH (ref 98–111)
Creatinine, Ser: 1.6 mg/dL — ABNORMAL HIGH (ref 0.61–1.24)
GFR calc Af Amer: 55 mL/min — ABNORMAL LOW (ref >60)
GFR calc non Af Amer: 47 mL/min — ABNORMAL LOW (ref >60)
Glucose, Bld: 176 mg/dL — ABNORMAL HIGH (ref 70–99)
Potassium: 5.4 mmol/L — ABNORMAL HIGH (ref 3.5–5.1)
Sodium: 140 mmol/L (ref 135–145)

## 2019-06-19 LAB — CBC
HCT: 27.2 % — ABNORMAL LOW (ref 39.0–52.0)
Hemoglobin: 8.7 g/dL — ABNORMAL LOW (ref 13.0–17.0)
MCH: 29.4 pg (ref 26.0–34.0)
MCHC: 32 g/dL (ref 30.0–36.0)
MCV: 91.9 fL (ref 80.0–100.0)
Platelets: 878 10*3/uL — ABNORMAL HIGH (ref 150–400)
RBC: 2.96 MIL/uL — ABNORMAL LOW (ref 4.22–5.81)
RDW: 13.2 % (ref 11.5–15.5)
WBC: 12.2 10*3/uL — ABNORMAL HIGH (ref 4.0–10.5)
nRBC: 0 % (ref 0.0–0.2)

## 2019-06-19 LAB — BRAIN NATRIURETIC PEPTIDE: B Natriuretic Peptide: 3281 pg/mL — ABNORMAL HIGH (ref 0.0–100.0)

## 2019-06-19 LAB — PROCALCITONIN: Procalcitonin: <0.1 ng/mL

## 2019-06-19 LAB — INFLUENZA PANEL BY PCR (TYPE A & B)
Influenza A By PCR: NEGATIVE
Influenza B By PCR: NEGATIVE

## 2019-06-19 LAB — TROPONIN I (HIGH SENSITIVITY)
Troponin I (High Sensitivity): 30 ng/L — ABNORMAL HIGH (ref <18)
Troponin I (High Sensitivity): 29 ng/L — ABNORMAL HIGH (ref <18)

## 2019-06-19 LAB — PROTIME-INR
INR: 1.2 (ref 0.8–1.2)
Prothrombin Time: 15.2 seconds (ref 11.4–15.2)

## 2019-06-19 MED ORDER — INSULIN GLARGINE 100 UNIT/ML ~~LOC~~ SOLN
10.0000 [IU] | Freq: Every day | SUBCUTANEOUS | Status: DC
  Administered 2019-06-20 – 2019-06-22 (×3): 10 [IU] via SUBCUTANEOUS
  Filled 2019-06-19 (×6): qty 0.1

## 2019-06-19 MED ORDER — ENOXAPARIN SODIUM 30 MG/0.3ML ~~LOC~~ SOLN: 30.0000 mg | SUBCUTANEOUS | Status: DC

## 2019-06-19 MED ORDER — VANCOMYCIN HCL IN DEXTROSE 1-5 GM/200ML-% IV SOLN
1000.0000 mg | Freq: Once | INTRAVENOUS | Status: AC
  Administered 2019-06-19: 1000 mg via INTRAVENOUS
  Filled 2019-06-19: qty 200

## 2019-06-19 MED ORDER — PNEUMOCOCCAL VAC POLYVALENT 25 MCG/0.5ML IJ INJ
0.5000 mL | INJECTION | INTRAMUSCULAR | Status: AC
  Administered 2019-06-23: 10:00:00 0.5 mL via INTRAMUSCULAR
  Filled 2019-06-19: qty 0.5

## 2019-06-19 MED ORDER — LINEZOLID 600 MG/300ML IV SOLN
600.0000 mg | Freq: Two times a day (BID) | INTRAVENOUS | Status: DC
  Administered 2019-06-20: 600 mg via INTRAVENOUS
  Filled 2019-06-19 (×3): qty 300

## 2019-06-19 MED ORDER — FUROSEMIDE 10 MG/ML IJ SOLN
20.0000 mg | Freq: Once | INTRAMUSCULAR | Status: AC
  Administered 2019-06-19: 19:00:00 20 mg via INTRAVENOUS
  Filled 2019-06-19: qty 4

## 2019-06-19 MED ORDER — SODIUM CHLORIDE 0.9 % IV SOLN
1.0000 g | Freq: Once | INTRAVENOUS | Status: AC
  Administered 2019-06-19: 1 g via INTRAVENOUS
  Filled 2019-06-19: qty 10

## 2019-06-19 MED ORDER — APIXABAN 5 MG PO TABS
5.0000 mg | ORAL_TABLET | Freq: Two times a day (BID) | ORAL | Status: DC
  Administered 2019-06-19 – 2019-06-21 (×4): 5 mg via ORAL
  Filled 2019-06-19 (×4): qty 1

## 2019-06-19 MED ORDER — SODIUM CHLORIDE 0.9 % IV SOLN
500.0000 mg | Freq: Once | INTRAVENOUS | Status: DC
  Filled 2019-06-19: qty 500

## 2019-06-19 MED ORDER — FUROSEMIDE 10 MG/ML IJ SOLN
20.0000 mg | Freq: Every day | INTRAMUSCULAR | Status: DC
  Administered 2019-06-20: 20 mg via INTRAVENOUS
  Filled 2019-06-19: qty 2

## 2019-06-19 MED ORDER — MUPIROCIN 2 % EX OINT
1.0000 "application " | TOPICAL_OINTMENT | Freq: Two times a day (BID) | CUTANEOUS | Status: DC
  Administered 2019-06-19 – 2019-06-24 (×3): 1 via NASAL
  Filled 2019-06-19: qty 22

## 2019-06-19 MED ORDER — SODIUM ZIRCONIUM CYCLOSILICATE 5 G PO PACK
5.0000 g | PACK | Freq: Once | ORAL | Status: AC
  Administered 2019-06-20: 5 g via ORAL
  Filled 2019-06-19 (×2): qty 1

## 2019-06-19 NOTE — ED Notes (Signed)
Pt laying in bed speaking with this RN in NAD, reports "my legs are so bloated I cant straighten them out". A&Ox4

## 2019-06-19 NOTE — Progress Notes (Signed)
Family Meeting Note  Advance Directive:yes  Today a meeting took place with the Patient.    The following clinical team members were present during this meeting:MD  The following were discussed:Patient's diagnosis: Shortness of breath healthcare associated pneumonia elevated BNP bilateral lower extremity edema right hand cellulitis atrial fibrillation insulin requiring diabetes mellitus and plan of care discussed in detail with the patient.  He verbalized understanding of the plan.  , Patient's progosis: Unable to determine and Goals for treatment: Full Code  Wife healthcare power of attorney  Additional follow-up to be provided: Hospitalist, infectious disease  Time spent during discussion:17 min  Nicholes Mango, MD

## 2019-06-19 NOTE — ED Notes (Signed)
Report given to telemetry RN 

## 2019-06-19 NOTE — ED Triage Notes (Signed)
Patient c/o SOB and swelling from torso and down. Bilateral leg swelling noted. Noted surgery last Monday 06/11/19 on right hand from infection.

## 2019-06-19 NOTE — ED Notes (Signed)
Blood collected by kristen long

## 2019-06-19 NOTE — ED Notes (Signed)
Attempted to call report to floor, was told nurse would have to call me back

## 2019-06-19 NOTE — ED Provider Notes (Signed)
[facility] Emergency Department Provider Note  ____________________________________________   First MD Initiated Contact with Patient 06/19/19 1648     (approximate)  I have reviewed the triage vital signs and the nursing notes.  History  Chief Complaint Shortness of Breath and Leg Swelling (bilateral)    HPI Nathan [name] is a 58 y.o. male history of AF, DM, HTN, CKD, HFrEF who presents for edema and SOB.   Patient reports development of significant BLE edema over the last day or so, even up into his groin. He feels "swollen" and SOB. He reports a mild cough, no fevers. No vomiting or diarrhea.   He denies any prior history of LE edema. He is not on any diuretic medications at home.   Recently admitted here from 9/20 to 9/22 for sepsis 2/2 cellulitis, MRSA bacteremia.    Past Medical Hx Past Medical History:  Diagnosis Date  . Atrial fibrillation ([place])   . Diabetes mellitus without complication ([place])   . Hypertension     Problem List Patient Active Problem List   Diagnosis Date Noted  . Sepsis ([place]) 06/10/2019    Past Surgical Hx Past Surgical History:  Procedure Laterality Date  . I&D EXTREMITY Right 06/11/2019   Procedure: IRRIGATION AND DEBRIDEMENT RIGHT THUMB;  Surgeon: Dereck Leep, MD;  Location: [facility] ORS;  Service: Orthopedics;  Laterality: Right;  . TEE WITHOUT CARDIOVERSION N/A 06/14/2019   Procedure: TRANSESOPHAGEAL ECHOCARDIOGRAM (TEE);  Surgeon: Minna Merritts, MD;  Location: [facility] ORS;  Service: Cardiovascular;  Laterality: N/A;    Medications Prior to Admission medications   Medication Sig Start Date End Date Taking? Authorizing Provider  apixaban (ELIQUIS) 5 MG TABS tablet Take 1 tablet (5 mg total) by mouth 2 (two) times daily. 06/16/19   Epifanio Lesches, MD  insulin glargine (LANTUS) 100 UNIT/ML injection Inject 0.1 mLs (10 Units total) into the skin daily. 06/16/19   Epifanio Lesches, MD  linezolid  (ZYVOX) 600 MG tablet Take 1 tablet (600 mg total) by mouth every 12 (twelve) hours. 06/16/19   Epifanio Lesches, MD  metoprolol succinate (TOPROL-XL) 100 MG 24 hr tablet Take 1 tablet (100 mg total) by mouth daily. Take with or immediately following a meal. 06/16/19   Epifanio Lesches, MD  mupirocin ointment (BACTROBAN) 2 % Place 1 application into the nose 2 (two) times daily. 06/16/19   Epifanio Lesches, MD  oxyCODONE-acetaminophen (PERCOCET) 5-325 MG tablet Take 1 tablet by mouth every 6 (six) hours as needed for moderate pain or severe pain. 06/16/19 06/15/20  Epifanio Lesches, MD    Allergies Patient has no known allergies.  Family Hx History reviewed. No pertinent family history.  Social Hx Social History   Tobacco Use  . Smoking status: Never Smoker  . Smokeless tobacco: Never Used  Substance Use Topics  . Alcohol use: Never    Frequency: Never  . Drug use: Never     Review of Systems  Constitutional: Negative for fever, chills. Eyes: Negative for visual changes. ENT: Negative for sore throat. Cardiovascular: Negative for chest pain. Respiratory: + for shortness of breath. Gastrointestinal: Negative for nausea, vomiting.  Genitourinary: Negative for dysuria. Musculoskeletal: + for leg swelling. Skin: Negative for rash. Neurological: Negative for for headaches.   Physical Exam  Vital Signs: ED Triage Vitals  Enc Vitals Group     BP 06/19/19 1326 (!) 173/88     Pulse Rate 06/19/19 1326 82     Resp 06/19/19 1326 18  Temp 06/19/19 1326 98.4 F (36.9 C)     Temp Source 06/19/19 1326 Oral     SpO2 06/19/19 1326 96 %     Weight 06/19/19 1332 150 lb (68 kg)     Height 06/19/19 1332 5\' 9"  (1.753 m)     Head Circumference --      Peak Flow --      Pain Score 06/19/19 1331 2     Pain Loc --      Pain Edu? --      Excl. in [place]? --     Constitutional: Alert and oriented.  Head: Normocephalic. Atraumatic. Eyes: Conjunctivae clear. Sclera  anicteric. Nose: No congestion. No rhinorrhea. Mouth/Throat: Wearing mask. Neck: No stridor.   Cardiovascular: Normal rate, regular rhythm.  Extremities well perfused. Respiratory: Oxygen low to mid 90s on RA. Decreased breath sounds at left base. Gastrointestinal: Soft. Non-tender. Non-distended.  Musculoskeletal: Pitting edema to BLE. Neurologic:  Normal speech and language. No gross focal neurologic deficits are appreciated.  Skin: Dressing in place to right wrist.  Psychiatric: Mood and affect are appropriate for situation.  EKG  Personally reviewed.   Rate: 83 Rhythm: normal Axis: normal Intervals: WNL TWI III, aVF No STEMI    Radiology  XR: IMPRESSION:  1. Extensive left lower lobe airspace consolidation consistent with  pneumonia with small left pleural effusion.   2. Subtle area of opacity in the right upper lobe measuring 2.1 x  1.7 cm. Question small focus of pneumonia in this area. This area  does appear somewhat nodular.   3. Heart size within normal limits.   4. No evident adenopathy.   Followup PA and lateral chest radiographs recommended in 3-4 weeks  following trial of antibiotic therapy to ensure resolution and  exclude underlying malignancy.   Procedures  Procedure(s) performed (including critical care):  Procedures   Initial Impression / Assessment and Plan / ED Course  58 y.o. male who presents to the ED for edema and SOB.  Ddx includes HF exacerbation, fluid overload from his resuscitation, pulmonary infection, ACS  Suspect his SOB is multifactorial. First, XR with a LLL opacity concerning for PNA - will cover with antibiotics. 2nd, likely volume up from his resuscitation from his recent hospitalization (sepsis 2/2 cellulitis and MRSA bacteremia) in the setting if HFrEF. BNP significantly elevated consistent with fluid overload. Will give dose of IV Lasix. Very slightly elevated HS troponin likely in setting of volume overload/HF. Will  plan to admit for further management of above. Patient agreeable. Discussed w/ hospitalist for admission.    Final Clinical Impression(s) / ED Diagnosis  Final diagnoses:  Shortness of breath  Generalized edema  Healthcare-associated pneumonia       Note:  This document was prepared using Dragon voice recognition software and may include unintentional dictation errors.   Lilia Pro., MD 06/19/19 [phone]

## 2019-06-19 NOTE — H&P (Signed)
[place] [facility] [place] NAME: Nathan [name]    MR#:  [id number]  DATE OF BIRTH:  Apr 29, 1961  DATE OF ADMISSION:  06/19/2019  PRIMARY CARE PHYSICIAN: Patient, No Pcp Per   REQUESTING/REFERRING PHYSICIAN: Monks ,md  CHIEF COMPLAINT:   Sob and leg swelling HISTORY OF PRESENT ILLNESS:  Nathan [name]  is a 58 y.o. male with a known history of insulin-dependent diabetes mellitus hypertension, atrial fibrillation on Eliquis was just discharged from the hospital on 06/15/2019 with right hand cellulitis and MRSA bacteremia with Zyvox twice a day to be continued for 14 more days.  X-ray is revealing infiltrate and BNP is elevated potassium at 5.4.  Patient is started on antibiotics in the emergency department and hospitalist team is called to admit the patient patient also received 1 dose of IV Lasix  PAST MEDICAL HISTORY:   Past Medical History:  Diagnosis Date  . Atrial fibrillation ([place])   . Diabetes mellitus without complication ([place])   . Hypertension     PAST SURGICAL HISTOIRY:   Past Surgical History:  Procedure Laterality Date  . I&D EXTREMITY Right 06/11/2019   Procedure: IRRIGATION AND DEBRIDEMENT RIGHT THUMB;  Surgeon: Dereck Leep, MD;  Location: [facility] ORS;  Service: Orthopedics;  Laterality: Right;  . TEE WITHOUT CARDIOVERSION N/A 06/14/2019   Procedure: TRANSESOPHAGEAL ECHOCARDIOGRAM (TEE);  Surgeon: Minna Merritts, MD;  Location: [facility] ORS;  Service: Cardiovascular;  Laterality: N/A;    SOCIAL HISTORY:   Social History   Tobacco Use  . Smoking status: Never Smoker  . Smokeless tobacco: Never Used  Substance Use Topics  . Alcohol use: Never    Frequency: Never    FAMILY HISTORY:  History reviewed. No pertinent family history.  DRUG ALLERGIES:  No Known Allergies  REVIEW OF SYSTEMS:  CONSTITUTIONAL: No fever, fatigue or weakness.  EYES: No blurred or double vision.  EARS, NOSE, AND THROAT: No tinnitus or  ear pain.  RESPIRATORY: Reporting cough, shortness of breath, denies wheezing or hemoptysis.  CARDIOVASCULAR: No chest pain, orthopnea, edema.  GASTROINTESTINAL: No nausea, vomiting, diarrhea or abdominal pain.  GENITOURINARY: No dysuria, hematuria.  ENDOCRINE: No polyuria, nocturia,  HEMATOLOGY: No anemia, easy bruising or bleeding SKIN: No rash or lesion.  Patient admits lower extremity swelling MUSCULOSKELETAL: No joint pain or arthritis.   NEUROLOGIC: No tingling, numbness, weakness.  PSYCHIATRY: No anxiety or depression.   MEDICATIONS AT HOME:   Prior to Admission medications   Medication Sig Start Date End Date Taking? Authorizing Provider  aspirin EC 81 MG tablet Take 81 mg by mouth daily.   Yes [provider]  insulin glargine (LANTUS) 100 UNIT/ML injection Inject 0.1 mLs (10 Units total) into the skin daily. 06/16/19  Yes Epifanio Lesches, MD  linezolid (ZYVOX) 600 MG tablet Take 1 tablet (600 mg total) by mouth every 12 (twelve) hours. 06/16/19  Yes Epifanio Lesches, MD  metoprolol succinate (TOPROL-XL) 100 MG 24 hr tablet Take 1 tablet (100 mg total) by mouth daily. Take with or immediately following a meal. 06/16/19  Yes Epifanio Lesches, MD  mupirocin ointment (BACTROBAN) 2 % Place 1 application into the nose 2 (two) times daily. 06/16/19  Yes Epifanio Lesches, MD  oxyCODONE-acetaminophen (PERCOCET) 5-325 MG tablet Take 1 tablet by mouth every 6 (six) hours as needed for moderate pain or severe pain. 06/16/19 06/15/20 Yes Epifanio Lesches, MD  apixaban (ELIQUIS) 5 MG TABS tablet Take 1 tablet (5 mg total) by mouth 2 (two)  times daily. Patient not taking: Reported on 06/19/2019 06/16/19   Epifanio Lesches, MD      VITAL SIGNS:  Blood pressure (!) 179/89, pulse 84, temperature 98.4 F (36.9 C), temperature source Oral, resp. rate 18, height 5\' 9"  (1.753 m), weight 68 kg, SpO2 93 %.  PHYSICAL EXAMINATION:  GENERAL:  58 y.o.-year-old patient lying in  the bed with no acute distress.  EYES: Pupils equal, round, reactive to light and accommodation. No scleral icterus. Extraocular muscles intact.  HEENT: Head atraumatic, normocephalic. Oropharynx and nasopharynx clear.  NECK:  Supple, no jugular venous distention. No thyroid enlargement, no tenderness.  LUNGS: Moderate breath sounds bilaterally, no wheezing, rales,rhonchi.  Positive crepitation. No use of accessory muscles of respiration.  CARDIOVASCULAR: S1, S2 normal. No murmurs, rubs, or gallops.  ABDOMEN: Soft, nontender, nondistended. Bowel sounds present.  EXTREMITIES: 2+ pedal edema, no cyanosis, or clubbing.  NEUROLOGIC: Cranial nerves II through XII are intact. Muscle strength 5/5 in all extremities. Sensation intact. Gait not checked.  PSYCHIATRIC: The patient is alert and oriented x 3.  SKIN: No obvious rash, lesion, or ulcer.   LABORATORY PANEL:   CBC Recent Labs  Lab 06/19/19 1349  WBC 12.2*  HGB 8.7*  HCT 27.2*  PLT 878*   ------------------------------------------------------------------------------------------------------------------  Chemistries  Recent Labs  Lab 06/19/19 1349  NA 140  K 5.4*  CL 114*  CO2 19*  GLUCOSE 176*  BUN 52*  CREATININE 1.60*  CALCIUM 8.4*   ------------------------------------------------------------------------------------------------------------------  Cardiac Enzymes No results for input(s): TROPONINI in the last 168 hours. ------------------------------------------------------------------------------------------------------------------  RADIOLOGY:  Dg Chest 2 View  Result Date: 06/19/2019 CLINICAL DATA:  Shortness of breath EXAM: CHEST - 2 VIEW COMPARISON:  None. FINDINGS: There is airspace consolidation throughout much of the left lower lobe with small left pleural effusion. There is a subtle ill-defined area of opacity in the right upper lobe, seen only on the frontal view. Lungs elsewhere clear. Heart size and pulmonary  vascularity are normal. No adenopathy. No bone lesions. IMPRESSION: 1. Extensive left lower lobe airspace consolidation consistent with pneumonia with small left pleural effusion. 2. Subtle area of opacity in the right upper lobe measuring 2.1 x 1.7 cm. Question small focus of pneumonia in this area. This area does appear somewhat nodular. 3.  Heart size within normal limits. 4.  No evident adenopathy. Followup PA and lateral chest radiographs recommended in 3-4 weeks following trial of antibiotic therapy to ensure resolution and exclude underlying malignancy. Electronically Signed   By: Lowella Grip III M.D.   On: 06/19/2019 14:14    EKG:   Orders placed or performed during the hospital encounter of 06/19/19  . ED EKG  . ED EKG  . EKG 12-Lead  . EKG 12-Lead    IMPRESSION AND PLAN:    #Healthcare associated pneumonia Admit to MedSurg unit, was just discharged from the hospital on 06/15/2019 IV Rocephin and vancomycin were given in the ED will continue  on Zyvox  Sputum culture and sensitivity ID consult placed to Dr. 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 labs  #Recent history  of right hand cellulitis with MRSA bacteremia and MRSA abscess of the right thumb TEE was negative during the previous admission Patient was seen by infectious disease Dr. Levester Fresh and patient was discharged home on linezolid for a total of 14 days on the day of discharge We will continue the same and ID consult placed Rounding physician to consider  consulting Dr. Bess Harvest orthopedic surgeon for suture removal when it is appropriate  #Chronic atrial fibrillation rate controlled Continue baby aspirin patient stopped taking Eliquis will resume the same Case management consult placed regarding medication assistance with Eliquis patient states he cannot afford it  #Insulin requiring diabetes mellitus Sliding scale insulin and continue Levemir    DVT prophylaxis with Eliquis  All the records are reviewed and case discussed with ED provider. Management plans discussed with the patient, family and they are in agreement.  CODE STATUS: fc   TOTAL TIME TAKING CARE OF THIS PATIENT: 45  minutes.   Note: This dictation was prepared with Dragon dictation along with smaller phrase technology. Any transcriptional errors that result from this process are unintentional.  Nicholes Mango M.D on 06/19/2019 at 7:50 PM  Between 7am to 6pm - Pager - [phone]  After 6pm go to [email or web address] - password [email or web address] [place] [facility]  Office  [phone]  CC: Primary care physician; Patient, No Pcp Per

## 2019-06-20 DIAGNOSIS — I10 Essential (primary) hypertension: Secondary | ICD-10-CM

## 2019-06-20 DIAGNOSIS — E119 Type 2 diabetes mellitus without complications: Secondary | ICD-10-CM

## 2019-06-20 LAB — HIV ANTIBODY (ROUTINE TESTING W REFLEX): HIV Screen 4th Generation wRfx: NONREACTIVE

## 2019-06-20 LAB — COMPREHENSIVE METABOLIC PANEL
ALT: 25 U/L (ref 0–44)
AST: 21 U/L (ref 15–41)
Albumin: 2.2 g/dL — ABNORMAL LOW (ref 3.5–5.0)
Alkaline Phosphatase: 287 U/L — ABNORMAL HIGH (ref 38–126)
Anion gap: 5 (ref 5–15)
BUN: 51 mg/dL — ABNORMAL HIGH (ref 6–20)
CO2: 20 mmol/L — ABNORMAL LOW (ref 22–32)
Calcium: 8 mg/dL — ABNORMAL LOW (ref 8.9–10.3)
Chloride: 116 mmol/L — ABNORMAL HIGH (ref 98–111)
Creatinine, Ser: 1.48 mg/dL — ABNORMAL HIGH (ref 0.61–1.24)
GFR calc Af Amer: >60 mL/min (ref >60)
GFR calc non Af Amer: 52 mL/min — ABNORMAL LOW (ref >60)
Glucose, Bld: 138 mg/dL — ABNORMAL HIGH (ref 70–99)
Potassium: 5.4 mmol/L — ABNORMAL HIGH (ref 3.5–5.1)
Sodium: 141 mmol/L (ref 135–145)
Total Bilirubin: 0.6 mg/dL (ref 0.3–1.2)
Total Protein: 5.6 g/dL — ABNORMAL LOW (ref 6.5–8.1)

## 2019-06-20 LAB — SARS CORONAVIRUS 2 (TAT 6-24 HRS): SARS Coronavirus 2: NEGATIVE

## 2019-06-20 MED ORDER — METOPROLOL SUCCINATE ER 100 MG PO TB24
100.0000 mg | ORAL_TABLET | Freq: Every day | ORAL | Status: DC
  Administered 2019-06-20 – 2019-06-24 (×5): 100 mg via ORAL
  Filled 2019-06-20 (×5): qty 1

## 2019-06-20 MED ORDER — LINEZOLID 600 MG PO TABS
600.0000 mg | ORAL_TABLET | Freq: Two times a day (BID) | ORAL | Status: DC
  Administered 2019-06-20 – 2019-06-24 (×8): 600 mg via ORAL
  Filled 2019-06-20 (×9): qty 1

## 2019-06-20 NOTE — Consult Note (Signed)
NAME: Nathan [name]  DOB: Apr 02, 1961  MRN: [id number]  Date/Time: 06/20/2019 5:57 PM  REQUESTING PROVIDER: Margaretmary Eddy Subjective:  REASON FOR CONSULT: HCAP/rt hand cellulitis ? Nathan [name] is a 58 y.o. male with a history of DM/HTN/AFIB recently in hospital for Rt hand abscess and underwnet I/D and had MRSA bacteremia and MRSA abscess. Was initially on vanco but because of worsening cr switched to linezolid after TEE was done and ruled out endocarditis was discharged home on 06/16/19 , is back to ED because of worsening swelling of his legs, abdomen and difficulty breathing.pt while in the hospital the last time had some edema of his legs. He did not have any fever, chest pain, some cough, difficulty in lying on his side In the ED vitals showed a temp of 98.4, BP of 173/88, HR 80. CXR showed left pleural effusion and left lower lobe consolidation. BNP was > 3000, Procal was < 0.10 It was thought that he was in CHF and not pneumonia as procal was normal. WBC was 12 better than discharge of 16. He was continued on linezolid and was started on IV lasix Pt says he is feeling better- he passed some urine  Feels lighter, sob better, able to lie back Past Medical History:  Diagnosis Date  . Atrial fibrillation ([place])   . Diabetes mellitus without complication ([place])   . Hypertension     Past Surgical History:  Procedure Laterality Date  . I&D EXTREMITY Right 06/11/2019   Procedure: IRRIGATION AND DEBRIDEMENT RIGHT THUMB;  Surgeon: Dereck Leep, MD;  Location: [facility] ORS;  Service: Orthopedics;  Laterality: Right;  . TEE WITHOUT CARDIOVERSION N/A 06/14/2019   Procedure: TRANSESOPHAGEAL ECHOCARDIOGRAM (TEE);  Surgeon: Minna Merritts, MD;  Location: [facility] ORS;  Service: Cardiovascular;  Laterality: N/A;    Social History   Socioeconomic History  . Marital status: Married    Spouse name: Not on file  . Number of children: Not on file  . Years of education: Not on file  . Highest education  level: Not on file  Occupational History  . Not on file  Social Needs  . Financial resource strain: Not on file  . Food insecurity    Worry: Not on file    Inability: Not on file  . Transportation needs    Medical: Not on file    Non-medical: Not on file  Tobacco Use  . Smoking status: Never Smoker  . Smokeless tobacco: Never Used  Substance and Sexual Activity  . Alcohol use: Never    Frequency: Never  . Drug use: Never  . Sexual activity: Not on file  Lifestyle  . Physical activity    Days per week: Not on file    Minutes per session: Not on file  . Stress: Not on file  Relationships  . Social Herbalist on phone: Not on file    Gets together: Not on file    Attends religious service: Not on file    Active member of club or organization: Not on file    Attends meetings of clubs or organizations: Not on file    Relationship status: Not on file  . Intimate partner violence    Fear of current or ex partner: Not on file    Emotionally abused: Not on file    Physically abused: Not on file    Forced sexual activity: Not on file  Other Topics Concern  . Not on file  Social History Narrative  .  Not on file   Family History MI father DM , sister, brother   NKDA  ? Current Facility-Administered Medications  Medication Dose Route Frequency Provider Last Rate Last Dose  . apixaban (ELIQUIS) tablet 5 mg  5 mg Oral BID Gouru, Aruna, MD   5 mg at 06/20/19 0937  . furosemide (LASIX) injection 20 mg  20 mg Intravenous Daily Gouru, Aruna, MD   20 mg at 06/20/19 0937  . insulin glargine (LANTUS) injection 10 Units  10 Units Subcutaneous Daily Nicholes Mango, MD   10 Units at 06/20/19 0937  . linezolid (ZYVOX) tablet 600 mg  600 mg Oral Q12H Berton Mount, RPH      . metoprolol succinate (TOPROL-XL) 24 hr tablet 100 mg  100 mg Oral Daily Vaughan Basta, MD   100 mg at 06/20/19 1200  . mupirocin ointment (BACTROBAN) 2 % 1 application  1 application Nasal BID  Nicholes Mango, MD   1 application at [id number]  . pneumococcal 23 valent vaccine (PNU-IMMUNE) injection 0.5 mL  0.5 mL Intramuscular Tomorrow-1000 Gouru, Aruna, MD         Abtx:  Anti-infectives (From admission, onward)   Start     Dose/Rate Route Frequency Ordered Stop   06/20/19 2000  linezolid (ZYVOX) tablet 600 mg     600 mg Oral Every 12 hours 06/20/19 1551     06/20/19 0700  linezolid (ZYVOX) IVPB 600 mg  Status:  Discontinued     600 mg 300 mL/hr over 60 Minutes Intravenous Every 12 hours 06/19/19 1947 06/20/19 1551   06/19/19 1900  vancomycin (VANCOCIN) IVPB 1000 mg/200 mL premix     1,000 mg 200 mL/hr over 60 Minutes Intravenous  Once 06/19/19 1847 06/19/19 2024   06/19/19 1800  azithromycin (ZITHROMAX) 500 mg in sodium chloride 0.9 % 250 mL IVPB  Status:  Discontinued     500 mg 250 mL/hr over 60 Minutes Intravenous  Once 06/19/19 1748 06/19/19 1846   06/19/19 1800  cefTRIAXone (ROCEPHIN) 1 g in sodium chloride 0.9 % 100 mL IVPB     1 g 200 mL/hr over 30 Minutes Intravenous  Once 06/19/19 1748 06/19/19 1943      REVIEW OF SYSTEMS:  Const: negative fever, negative chills, negative weight loss Eyes: negative diplopia or visual changes, negative eye pain ENT: negative coryza, negative sore throat Resp: minimal cough, hemoptysis, ++dyspnea Cards: negative for chest pain, palpitations, lower extremity edema GU: negative for frequency, dysuria and hematuria GI: Negative for abdominal pain, diarrhea, bleeding, constipation Skin: negative for rash and pruritus Heme: negative for easy bruising and gum/nose bleeding MS: negative for myalgias, arthralgias, back pain and muscle weakness Neurolo:negative for headaches, dizziness, vertigo, memory problems  Psych: negative for feelings of anxiety, depression  Endocrine: has diabetes Allergy/Immunology- negative for any medication or food allergies ?  Objective:  VITALS:  BP (!) 156/89   Pulse 82   Temp 98.6 F (37 C)    Resp 18   Ht 5\' 9"  (1.753 m)   Wt 76.4 kg   SpO2 92%   BMI 24.87 kg/m  PHYSICAL EXAM:  General: Alert, cooperative, no distress, appears stated age.  Head: Normocephalic, without obvious abnormality, atraumatic. Eyes: Conjunctivae clear, anicteric sclerae. Pupils are equal ENT Nares normal. No drainage or sinus tenderness. Lips, mucosa, and tongue normal. No Thrush Neck: Supple, symmetrical, no adenopathy, thyroid: non tender no carotid bruit and no JVD. Back: No CVA tenderness. Lungs b/l air entry- decreased left base Heart: irregular  Abdomen: distended- ascites?/ Extremities: edema legs Rt hand abscess I/D site - sutures, no erythema, no tenderness, no swelling, some dry skin/scab        Skin: No rashes or lesions. Or bruising Lymph: Cervical, supraclavicular normal. Neurologic: Grossly non-focal Pertinent Labs Lab Results CBC    Component Value Date/Time   WBC 12.2 (H) 06/19/2019 1349   RBC 2.96 (L) 06/19/2019 1349   HGB 8.7 (L) 06/19/2019 1349   HCT 27.2 (L) 06/19/2019 1349   PLT 878 (H) 06/19/2019 1349   MCV 91.9 06/19/2019 1349   MCH 29.4 06/19/2019 1349   MCHC 32.0 06/19/2019 1349   RDW 13.2 06/19/2019 1349   LYMPHSABS 0.5 (L) 06/10/2019 1451   MONOABS 2.6 (H) 06/10/2019 1451   EOSABS 0.3 06/10/2019 1451   BASOSABS 0.1 06/10/2019 1451    CMP Latest Ref Rng & Units 06/20/2019 06/19/2019 06/15/2019  Glucose 70 - 99 mg/dL 138(H) 176(H) 213(H)  BUN 6 - 20 mg/dL 51(H) 52(H) 62(H)  Creatinine 0.61 - 1.24 mg/dL 1.48(H) 1.60(H) 1.52(H)  Sodium 135 - 145 mmol/L 141 140 137  Potassium 3.5 - 5.1 mmol/L 5.4(H) 5.4(H) 4.3  Chloride 98 - 111 mmol/L 116(H) 114(H) 112(H)  CO2 22 - 32 mmol/L 20(L) 19(L) 19(L)  Calcium 8.9 - 10.3 mg/dL 8.0(L) 8.4(L) 7.5(L)  Total Protein 6.5 - 8.1 g/dL 5.6(L) - -  Total Bilirubin 0.3 - 1.2 mg/dL 0.6 - -  Alkaline Phos 38 - 126 U/L 287(H) - -  AST 15 - 41 U/L 21 - -  ALT 0 - 44 U/L 25 - -      Microbiology: Recent Results (from  the past 240 hour(s))  Surgical pcr screen     Status: Abnormal   Collection Time: 06/11/19  8:21 PM   Specimen: Nasal Mucosa; Nasal Swab  Result Value Ref Range Status   MRSA, PCR POSITIVE (A) NEGATIVE Final    Comment: RESULT CALLED TO, READ BACK BY AND VERIFIED WITH: Janeann Merl ON 06/11/2019 AT 2148 QSD    Staphylococcus aureus POSITIVE (A) NEGATIVE Final    Comment: (NOTE) The Xpert SA Assay (FDA approved for NASAL specimens in patients 73 years of age and older), is one component of a comprehensive surveillance program. It is not intended to diagnose infection nor to guide or monitor treatment. Performed at [facility], [address] [place], [place] [address]   Aerobic/Anaerobic Culture (surgical/deep wound)     Status: None   Collection Time: 06/11/19 10:30 PM   Specimen: PATH Other; Wound  Result Value Ref Range Status   Specimen Description   Final    HAND RIGHT DORSAL INCISION Performed at [facility], [address] [place], [place] [address]    Special Requests   Final    PATIENT ON FOLLOWING CEFEPIME VANCOMYCIN Performed at [facility], [place] [address], [place], [place] [address]    Gram Stain   Final    NO ORGANISMS SEEN RARE WBC MANY RBC Performed at [facility], [address] [place], [place] [address]    Culture   Final    RARE METHICILLIN RESISTANT STAPHYLOCOCCUS AUREUS NO ANAEROBES ISOLATED Performed at [facility], [place] [address] [place], [place] [address]    Report Status 06/17/2019 FINAL  Final   Organism ID, Bacteria METHICILLIN RESISTANT STAPHYLOCOCCUS AUREUS  Final      Susceptibility   Methicillin resistant staphylococcus aureus - MIC*    CIPROFLOXACIN >=8 RESISTANT Resistant  ERYTHROMYCIN >=8 RESISTANT Resistant     GENTAMICIN <=0.5 SENSITIVE Sensitive     OXACILLIN >=4 RESISTANT Resistant     TETRACYCLINE <=1 SENSITIVE Sensitive     VANCOMYCIN <=0.5 SENSITIVE Sensitive      TRIMETH/SULFA <=10 SENSITIVE Sensitive     CLINDAMYCIN <=0.25 SENSITIVE Sensitive     RIFAMPIN <=0.5 SENSITIVE Sensitive     Inducible Clindamycin NEGATIVE Sensitive     * RARE METHICILLIN RESISTANT STAPHYLOCOCCUS AUREUS  Aerobic/Anaerobic Culture (surgical/deep wound)     Status: None   Collection Time: 06/11/19 10:33 PM   Specimen: PATH Other; Wound  Result Value Ref Range Status   Specimen Description   Final    HAND Performed at [facility], [address] [place], [place] [address]    Special Requests   Final    RIGHT HAND DORSAL INCISION 2 PATIENT ON FOLLOWING CEFEPIME VANCOMYCIN Performed at [facility], [place] [address], [place], [place] [address]    Gram Stain   Final    NO ORGANISMS SEEN MANY RBC FEW WBC Performed at [facility], [address] [place], [place] [address]    Culture   Final    FEW METHICILLIN RESISTANT STAPHYLOCOCCUS AUREUS NO ANAEROBES ISOLATED Performed at [facility], [place] [address] [place], [place] [address]    Report Status 06/17/2019 FINAL  Final   Organism ID, Bacteria METHICILLIN RESISTANT STAPHYLOCOCCUS AUREUS  Final      Susceptibility   Methicillin resistant staphylococcus aureus - MIC*    CIPROFLOXACIN >=8 RESISTANT Resistant     ERYTHROMYCIN >=8 RESISTANT Resistant     GENTAMICIN <=0.5 SENSITIVE Sensitive     OXACILLIN >=4 RESISTANT Resistant     TETRACYCLINE <=1 SENSITIVE Sensitive     VANCOMYCIN <=0.5 SENSITIVE Sensitive     TRIMETH/SULFA <=10 SENSITIVE Sensitive     CLINDAMYCIN <=0.25 SENSITIVE Sensitive     RIFAMPIN <=0.5 SENSITIVE Sensitive     Inducible Clindamycin NEGATIVE Sensitive     * FEW METHICILLIN RESISTANT STAPHYLOCOCCUS AUREUS  Aerobic/Anaerobic Culture (surgical/deep wound)     Status: None   Collection Time: 06/11/19 10:53 PM   Specimen: PATH Other; Wound  Result Value Ref Range Status   Specimen Description   Final    HAND Performed at [facility], [address] [place], [place] [address]    Special Requests   Final    RIGHT HAND LOWER INCISION PATIENT ON FOLLOWING CEFEPIME VANCOMYCIN Performed at [facility], [place] [address], [place], [place] [address]    Gram Stain   Final    NO ORGANISMS SEEN FEW WBC MODERATE RBC Performed at [facility], [address] [place],  [address]    Culture   Final    RARE STAPHYLOCOCCUS AUREUS SUSCEPTIBILITIES PERFORMED ON PREVIOUS CULTURE WITHIN THE LAST 5 DAYS. NO ANAEROBES ISOLATED Performed at [facility], [place] [address] [place],  [address]    Report Status 06/17/2019 FINAL  Final  Culture, blood (Routine X 2) w Reflex to ID Panel     Status: None   Collection Time: 06/12/19  9:03 AM   Specimen: BLOOD LEFT HAND  Result Value Ref Range Status   Specimen Description BLOOD LEFT HAND  Final   Special Requests   Final    BOTTLES DRAWN AEROBIC AND ANAEROBIC Blood Culture results may not be optimal due to an inadequate volume of blood received in culture bottles   Culture   Final  NO GROWTH 5 DAYS Performed at [facility], [address] [place], [place] [address]    Report Status 06/17/2019 FINAL  Final  Culture, blood (Routine X 2) w Reflex to ID Panel     Status: None   Collection Time: 06/12/19  9:03 AM   Specimen: BLOOD  Result Value Ref Range Status   Specimen Description BLOOD LEFT ARM  Final   Special Requests   Final    BOTTLES DRAWN AEROBIC AND ANAEROBIC Blood Culture adequate volume   Culture   Final    NO GROWTH 5 DAYS Performed at [facility], [address], [place], [place] [address]    Report Status 06/17/2019 FINAL  Final  SARS CORONAVIRUS 2 (TAT 6-24 HRS) Nasopharyngeal Nasopharyngeal Swab     Status: None   Collection Time: 06/19/19  6:16 PM   Specimen: Nasopharyngeal Swab  Result Value Ref Range Status   SARS Coronavirus 2 NEGATIVE NEGATIVE Final    Comment: (NOTE) SARS-CoV-2 target nucleic acids are NOT  DETECTED. The SARS-CoV-2 RNA is generally detectable in upper and lower respiratory specimens during the acute phase of infection. Negative results do not preclude SARS-CoV-2 infection, do not rule out co-infections with other pathogens, and should not be used as the sole basis for treatment or other patient management decisions. Negative results must be combined with clinical observations, patient history, and epidemiological information. The expected result is Negative. Fact Sheet for Patients: SugarRoll.be Fact Sheet for Healthcare Providers: https://www.woods-mathews.com/ This test is not yet approved or cleared by the [place] FDA and  has been authorized for detection and/or diagnosis of SARS-CoV-2 by FDA under an Emergency Use Authorization (EUA). This EUA will remain  in effect (meaning this test can be used) for the duration of the COVID-19 declaration under Section 56 4(b)(1) of the Act, 21 U.S.C. section 360bbb-3(b)(1), unless the authorization is terminated or revoked sooner. Performed at [facility], [place] [address] [place],  [address]     IMAGING RESULTS:  I have personally reviewed the films ? Impression/Recommendation ?58 yr male who was in hospital for 5 days last week admitted with increasing edema below nipple and sob  Left pleural effusion due to fluid overload- doubt this is pneumonia as < 0.10 procalcitonin, no WBC and no fever . Pt has edema legs and edema abd wall/scrotal edema has BNP of >3000. Has afib Also anemia and low albumin - all contributing to fluid overload and third spacing. Also he receievd IV fluids last admisison and the edema started then Will not treat as HCAP Continue diuretics- ? Cardiology consult  Recent MRSA bacteremia 1of 4 bottle- repeat was neg TEE was neg for vegetationThe source of the bacteremia was the rt hand abscess- he is on Linezolid- change to PO- will complete a  10 day course- watch closely HB/platelet   Anemia-unclear etiology  AKI ? Due to DM ? HTN Check UA to look for proteinuria  Discussed the management with the patient     ? ? ___________________________________________________ Discussed with patient, requesting provider Note:  This document was prepared using Dragon voice recognition software and may include unintentional dictation errors.

## 2019-06-21 ENCOUNTER — Inpatient Hospital Stay: Payer: Self-pay

## 2019-06-21 ENCOUNTER — Other Ambulatory Visit: Payer: Self-pay | Admitting: Cardiothoracic Surgery

## 2019-06-21 DIAGNOSIS — J90 Pleural effusion, not elsewhere classified: Secondary | ICD-10-CM

## 2019-06-21 DIAGNOSIS — L02511 Cutaneous abscess of right hand: Secondary | ICD-10-CM

## 2019-06-21 DIAGNOSIS — B9562 Methicillin resistant Staphylococcus aureus infection as the cause of diseases classified elsewhere: Secondary | ICD-10-CM

## 2019-06-21 DIAGNOSIS — R7881 Bacteremia: Secondary | ICD-10-CM

## 2019-06-21 DIAGNOSIS — J869 Pyothorax without fistula: Secondary | ICD-10-CM

## 2019-06-21 DIAGNOSIS — I4891 Unspecified atrial fibrillation: Secondary | ICD-10-CM

## 2019-06-21 DIAGNOSIS — E8770 Fluid overload, unspecified: Secondary | ICD-10-CM

## 2019-06-21 DIAGNOSIS — D649 Anemia, unspecified: Secondary | ICD-10-CM

## 2019-06-21 LAB — BASIC METABOLIC PANEL
Anion gap: 8 (ref 5–15)
BUN: 58 mg/dL — ABNORMAL HIGH (ref 6–20)
CO2: 20 mmol/L — ABNORMAL LOW (ref 22–32)
Calcium: 8.1 mg/dL — ABNORMAL LOW (ref 8.9–10.3)
Chloride: 113 mmol/L — ABNORMAL HIGH (ref 98–111)
Creatinine, Ser: 1.65 mg/dL — ABNORMAL HIGH (ref 0.61–1.24)
GFR calc Af Amer: 53 mL/min — ABNORMAL LOW (ref >60)
GFR calc non Af Amer: 46 mL/min — ABNORMAL LOW (ref >60)
Glucose, Bld: 91 mg/dL (ref 70–99)
Potassium: 5.2 mmol/L — ABNORMAL HIGH (ref 3.5–5.1)
Sodium: 141 mmol/L (ref 135–145)

## 2019-06-21 LAB — PROCALCITONIN: Procalcitonin: <0.1 ng/mL

## 2019-06-21 MED ORDER — LOSARTAN POTASSIUM 50 MG PO TABS
50.0000 mg | ORAL_TABLET | Freq: Every day | ORAL | Status: DC
  Filled 2019-06-21: qty 1

## 2019-06-21 MED ORDER — AMLODIPINE BESYLATE 5 MG PO TABS
5.0000 mg | ORAL_TABLET | Freq: Every day | ORAL | Status: DC
  Administered 2019-06-21 – 2019-06-24 (×4): 5 mg via ORAL
  Filled 2019-06-21 (×4): qty 1

## 2019-06-21 MED ORDER — LABETALOL HCL 5 MG/ML IV SOLN
5.0000 mg | INTRAVENOUS | Status: DC | PRN
  Administered 2019-06-21: 5 mg via INTRAVENOUS
  Filled 2019-06-21: qty 4

## 2019-06-21 MED ORDER — ENOXAPARIN SODIUM 80 MG/0.8ML ~~LOC~~ SOLN: 1.0000 mg/kg | Freq: Two times a day (BID) | SUBCUTANEOUS | Status: DC

## 2019-06-21 MED ORDER — SODIUM CHLORIDE 0.9% FLUSH
3.0000 mL | Freq: Two times a day (BID) | INTRAVENOUS | Status: DC
  Administered 2019-06-21 – 2019-06-24 (×6): 3 mL via INTRAVENOUS

## 2019-06-21 MED ORDER — FUROSEMIDE 10 MG/ML IJ SOLN: 20.0000 mg | Freq: Every day | INTRAMUSCULAR | Status: DC

## 2019-06-21 MED ORDER — FUROSEMIDE 10 MG/ML IJ SOLN
20.0000 mg | Freq: Two times a day (BID) | INTRAMUSCULAR | Status: DC
  Administered 2019-06-21: 20 mg via INTRAVENOUS
  Filled 2019-06-21: qty 2

## 2019-06-21 MED ORDER — SODIUM ZIRCONIUM CYCLOSILICATE 10 G PO PACK
10.0000 g | PACK | Freq: Once | ORAL | Status: AC
  Administered 2019-06-21: 10 g via ORAL
  Filled 2019-06-21: qty 1

## 2019-06-21 NOTE — Progress Notes (Signed)
Date of Admission:  06/19/2019      Subjective: Pt feeling better SOB much improved Swelling and tightness legs better but still present  Medications:  . amLODipine  5 mg Oral Daily  . [START ON 06/22/2019] furosemide  20 mg Intravenous Daily  . insulin glargine  10 Units Subcutaneous Daily  . linezolid  600 mg Oral Q12H  . metoprolol succinate  100 mg Oral Daily  . mupirocin ointment  1 application Nasal BID  . pneumococcal 23 valent vaccine  0.5 mL Intramuscular Tomorrow-1000    Objective: Vital signs in last 24 hours: Temp:  [98.5 F (36.9 C)-99.1 F (37.3 C)] 98.5 F (36.9 C) (10/01 0729) Pulse Rate:  [72-79] 72 (10/01 1820) Resp:  [16-19] 19 (10/01 1619) BP: (147-175)/(80-93) 149/80 (10/01 1820) SpO2:  [91 %-97 %] 97 % (10/01 1619) Weight:  [76.5 kg] 76.5 kg (10/01 0442)  PHYSICAL EXAM:  General: Alert, cooperative, no distress, appears stated age.  Head: Normocephalic, without obvious abnormality, atraumatic. Eyes: Conjunctivae clear, anicteric sclerae. Pupils are equal ENT Nares normal. No drainage or sinus tenderness. Lips, mucosa, and tongue normal. No Thrush Neck: Supple, symmetrical, no adenopathy, thyroid: non tender no carotid bruit and no JVD. Back: No CVA tenderness. Lungs: b/l air entry- decreased left base Heart: irregualr. Abdomen: Soft, non-tender,not distended. Bowel sounds normal. No masses Extremities: edema legs Rt hand dressing not removed today        Lymph: Cervical, supraclavicular normal. Neurologic: Grossly non-focal  Lab Results Recent Labs    06/19/19 1349 06/20/19 0407 06/21/19 0552  WBC 12.2*  --   --   HGB 8.7*  --   --   HCT 27.2*  --   --   NA 140 141 141  K 5.4* 5.4* 5.2*  CL 114* 116* 113*  CO2 19* 20* 20*  BUN 52* 51* 58*  CREATININE 1.60* 1.48* 1.65*   Liver Panel Recent Labs    06/20/19 0407  PROT 5.6*  ALBUMIN 2.2*  AST 21  ALT 25  ALKPHOS 287*  BILITOT 0.6   Sedimentation Rate No results for  input(s): ESRSEDRATE in the last 72 hours. C-Reactive Protein No results for input(s): CRP in the last 72 hours.  Microbiology:  Studies/Results: Dg Chest 2 View  Result Date: 06/21/2019 CLINICAL DATA:  Pleural effusion EXAM: CHEST - 2 VIEW COMPARISON:  06/19/2019 FINDINGS: No significant change in moderate left, small right pleural effusions with associated atelectasis or consolidation of the left lung. There is some degree of underlying diffuse heterogeneous and interstitial bilateral airspace opacity suggesting multifocal infection or edema. The heart and mediastinum are unremarkable. IMPRESSION: No significant change in moderate left, small right pleural effusions with associated atelectasis or consolidation of the left lung. There is some degree of underlying diffuse heterogeneous and interstitial bilateral airspace opacity suggesting multifocal infection or edema. CT may be helpful to further assess. Electronically Signed   By: Eddie Candle M.D.   On: 06/21/2019 09:57   Ct Chest Wo Contrast  Result Date: 06/21/2019 CLINICAL DATA:  Chest x-ray today showed small right pleural effusions with associated atelectasis or consolidation of the left lung. There is some degree of underlying diffuse heterogeneous and interstitial bilateral airspace opacity suggesting multifocal infection or edema. EXAM: CT CHEST WITHOUT CONTRAST TECHNIQUE: Multidetector CT imaging of the chest was performed following the standard protocol without IV contrast. COMPARISON:  Chest x-ray on 06/21/2019 FINDINGS: Cardiovascular: Heart is mildly enlarged. There is coronary artery calcification. Small pericardial effusion measures less  than 5 millimeters. There is atherosclerotic calcification of the thoracic aorta not associated with aneurysm. Mediastinum/Nodes: The visualized portion of the thyroid gland has a normal appearance. Multiple enlarged mediastinal and hilar lymph nodes are present. Prevascular lymph node is 1.4  centimeters on image 71/2. Precarinal lymph node is 1.2 centimeters on image 58/2. Esophagus is normal in appearance. Lungs/Pleura: Bilateral pleural effusions. RIGHT pleural effusion appears simple. Effusion in the LEFT lung appears loculated and somewhat irregular. The findings raise a question possible empyema. Lack of intravenous contrast limits full evaluation. There is consolidation in the LEFT LOWER lobe and to a lesser degree in the RIGHT LOWER lobe. There are patchy ground-glass opacities within the RIGHT UPPER lobe, somewhat confluent in areas and favoring infectious process. There is subpleural septal thickening primarily within the UPPER lobes. Upper Abdomen: Small amount of perihepatic fluid. Gallbladder is present. Musculoskeletal: No chest wall mass or suspicious bone lesions identified. IMPRESSION: 1. Cardiomegaly and coronary artery disease. 2. Small pericardial effusion. 3. Bilateral pleural effusions, left greater than RIGHT. Possible LEFT empyema. 4. Bilateral lower lobe consolidation and ground-glass opacities within the RIGHT UPPER lobe, consistent with infectious process. 5. Mediastinal and hilar adenopathy, likely reactive. 6. Mild pulmonary edema. 7. Small amount of perihepatic fluid. 8. Aortic Atherosclerosis (ICD10-I70.0). Electronically Signed   By: Nolon Nations M.D.   On: 06/21/2019 14:21     Assessment/Plan:  58 yr male who was in hospital for 5 days last week admitted with increasing edema below nipple and sob  Left pleural effusion due to fluid overload- concern for empyema  doubt this is pneumonia as < 0.10 procalcitonin, no WBC and no fever . Pt has edema legs and edema abd wall/scrotal edema has BNP of >3000. Has afib Also anemia and low albumin - all contributing to fluid overload and third spacing. Also he receievd IV fluids last admisison and the edema started then but if empyema present then it is likely due to MRSA which he had before and is on the right  antibiotic- linezolid- will not expand coverage Can do throrocentesis to check for exudate Continue diuretics- ? Cardiology consult  Recent MRSA bacteremia 1of 4 bottle- repeat was neg TEE was neg for vegetationThe source of the bacteremia was the rt hand abscess- he is on Linezolid- change to PO- will complete a 14 day course- watch closely HB/platelet  If empyema then may give it longer  Anemia-unclear etiology  AKI ? Due to DM ? HTN Check UA to look for proteinuria  Discussed the management with the patient, Dr.VAcchani and Dr.KAsa

## 2019-06-21 NOTE — Consult Note (Signed)
  ANTICOAGULATION CONSULT NOTE  Pharmacy Consult for enoxaparin initiation Indication: atrial fibrillation  Patient Measurements: Height: 5\' 9"  (175.3 cm) Weight: 168 lb 9.6 oz (76.5 kg) IBW/kg (Calculated) : 70.7  Vital Signs: Temp: 98.5 F (36.9 C) (10/01 0729) Temp Source: Oral (10/01 0729) BP: 175/93 (10/01 0729) Pulse Rate: 75 (10/01 0729)  Labs: Recent Labs    06/19/19 1349 06/19/19 1638 06/20/19 0407 06/21/19 0552  HGB 8.7*  --   --   --   HCT 27.2*  --   --   --   PLT 878*  --   --   --   LABPROT 15.2  --   --   --   INR 1.2  --   --   --   CREATININE 1.60*  --  1.48* 1.65*  TROPONINIHS 30* 29*  --   --     Estimated Creatinine Clearance: 50 mL/min (A) (by C-G formula based on SCr of 1.65 mg/dL (H)).   Medical History: Past Medical History:  Diagnosis Date  . Atrial fibrillation ([place])   . Diabetes mellitus without complication ([place])   . Hypertension     Medications:  Scheduled:  . amLODipine  5 mg Oral Daily  . furosemide  20 mg Intravenous Q12H  . insulin glargine  10 Units Subcutaneous Daily  . linezolid  600 mg Oral Q12H  . metoprolol succinate  100 mg Oral Daily  . mupirocin ointment  1 application Nasal BID  . pneumococcal 23 valent vaccine  0.5 mL Intramuscular Tomorrow-1000    Assessment: 58 y.o. male with a known history of insulin-dependent diabetes mellitus hypertension, atrial fibrillation on Eliquis was just discharged from the hospital on 06/15/2019 with right hand cellulitis and MRSA bacteremia with Zyvox twice a day to be continued. Today CT scan of the chest reported collection on left could be empyema with right upper lobe possible infiltrate.  Eliquis is being changed to enoxaparin due to possible drain needed of the pleural effusion. His last dose of apixaban was 0922 10/01. Hgb 8.7, PLT 878  Goal of Therapy:  Monitor platelets by anticoagulation protocol: Yes   Plan:   Start enoxaparin 1 mg/kg beginning 12 hours after last  apixaban dose  CBC in am  SCr in am  Dallie Piles, PharmD 06/21/2019,3:20 PM

## 2019-06-21 NOTE — Progress Notes (Signed)
Patient ID: Nathan [name], male   DOB: 12-Aug-1961, 58 y.o.   MRN: [id number]  Chief Complaint  Patient presents with  . Shortness of Breath  . Leg Swelling    bilateral    Referred By Dr. Woody Seller Reason for Referral complicated left pleural effusion  HPI Location, Quality, Duration, Severity, Timing, Context, Modifying Factors, Associated Signs and Symptoms.  Nathan [name] is a 58 y.o. male.  His problems began approximately 10 days ago when he began noticing some pain in his right thumb.  Over the course of several days this progressed and he was seen at the urgent care center and ultimately sent here where he was diagnosed with a thenar abscess requiring surgical intervention.  During that hospitalization he was noted to have a white count in the high 20s and was ultimately discharged home on antibiotic therapy.  While at home he states he continued to be short of breath and noticed swelling in his lower extremities.  He came back in the hospital and was found to have an airspace of opacification in the left hemithorax as well as a small right pleural effusion.  Over the next 2 days his chest x-ray did not improve and a CT scan performed today showed a loculated left pleural effusion and a more simple appearing right pleural effusion.  It is have consolidation in the left lower lobe as well as a small pericardial effusion.  Of note is that the patient's white blood cell count is trending down and is almost normal.  He denied any fevers at home.  He denied any cough.  His BNP is markedly elevated in the mid 2000 range.  His creatinine is stable at 1.7-1.8.   Past Medical History:  Diagnosis Date  . Atrial fibrillation ([place])   . Diabetes mellitus without complication ([place])   . Hypertension     Past Surgical History:  Procedure Laterality Date  . I&D EXTREMITY Right 06/11/2019   Procedure: IRRIGATION AND DEBRIDEMENT RIGHT THUMB;  Surgeon: Dereck Leep, MD;  Location: [facility]  ORS;  Service: Orthopedics;  Laterality: Right;  . TEE WITHOUT CARDIOVERSION N/A 06/14/2019   Procedure: TRANSESOPHAGEAL ECHOCARDIOGRAM (TEE);  Surgeon: Minna Merritts, MD;  Location: [facility] ORS;  Service: Cardiovascular;  Laterality: N/A;    History reviewed. No pertinent family history.  Social History Social History   Tobacco Use  . Smoking status: Never Smoker  . Smokeless tobacco: Never Used  Substance Use Topics  . Alcohol use: Never    Frequency: Never  . Drug use: Never    No Known Allergies  Current Facility-Administered Medications  Medication Dose Route Frequency Provider Last Rate Last Dose  . amLODipine (NORVASC) tablet 5 mg  5 mg Oral Daily Vaughan Basta, MD   5 mg at 06/21/19 [id number]  . enoxaparin (LOVENOX) injection 75 mg  1 mg/kg Subcutaneous Q12H Dallie Piles, RPH      . [START ON 06/22/2019] furosemide (LASIX) injection 20 mg  20 mg Intravenous Daily Vaughan Basta, MD      . insulin glargine (LANTUS) injection 10 Units  10 Units Subcutaneous Daily Nicholes Mango, MD   10 Units at 06/21/19 [id number]  . linezolid (ZYVOX) tablet 600 mg  600 mg Oral Q12H Berton Mount, RPH   600 mg at 06/21/19 [id number]  . metoprolol succinate (TOPROL-XL) 24 hr tablet 100 mg  100 mg Oral Daily Vaughan Basta, MD   100 mg at 06/21/19 [id number]  . mupirocin ointment (BACTROBAN) 2 %  1 application  1 application Nasal BID Nicholes Mango, MD   1 application at [id number]  . pneumococcal 23 valent vaccine (PNU-IMMUNE) injection 0.5 mL  0.5 mL Intramuscular Tomorrow-1000 Gouru, Aruna, MD          Review of Systems A complete review of systems was asked and was negative except for the following positive findings shortness of breath with lower extremity swelling  Blood pressure (!) 175/92, pulse 72, temperature 98.5 F (36.9 C), temperature source Oral, resp. rate 19, height 5\' 9"  (1.753 m), weight 76.5 kg, SpO2 97 %.  Physical Exam CONSTITUTIONAL:  Pleasant,  well-developed, well-nourished, and in no acute distress. EYES: Pupils equal and reactive to light, Sclera non-icteric EARS, NOSE, MOUTH AND THROAT:  The oropharynx was clear.  Dentition is good repair.  Oral mucosa pink and moist. LYMPH NODES:  Lymph nodes in the neck and axillae were normal RESPIRATORY:  Lungs demonstrated bilateral rhonchi.  Normal respiratory effort without pathologic use of accessory muscles of respiration CARDIOVASCULAR: Heart was regular without murmurs.  There were no carotid bruits. GI: The abdomen was soft, nontender, and nondistended. There were no palpable masses. There was no hepatosplenomegaly. There were normal bowel sounds in all quadrants. GU:  Rectal deferred.   MUSCULOSKELETAL:  Normal muscle strength and tone.  No clubbing or cyanosis.   SKIN:  There were no pathologic skin lesions.  There were no nodules on palpation.  He does have some anasarca although this is not too severe NEUROLOGIC:  Sensation is normal.  Cranial nerves are grossly intact. PSYCH:  Oriented to person, place and time.  Mood and affect are normal.  Data Reviewed Chest x-rays and CT scans  I have personally reviewed the patient's imaging, laboratory findings and medical records.    Assessment    I have independently reviewed the patient's chest x-rays and CT scans.  I have reviewed his labs.  Given his markedly elevated BNP and near normal white blood cell count without other signs of pneumonia this complicated left parapneumonic effusion may be related to heart failure instead of an infectious process.    Plan    I agree with Dr. Christel Mormon that an interventionally placed pigtail catheter would be indicated.  Once this is placed in the pleural fluid is sampled we can then make a better determination of what this may be and how to treat it.  I will be away for the next 10 days but our general surgery colleagues will be able to assist you in management.       Nestor Lewandowsky,  MD 06/21/2019, 4:25 PM   Patient ID: Nathan [name], male   DOB: 05-Nov-1960, 58 y.o.   MRN: [id number]

## 2019-06-21 NOTE — Progress Notes (Signed)
4 sutures removed from right hand as ordered/ steristrips applied/ incision well approximated/ pt tolerated well.

## 2019-06-21 NOTE — Progress Notes (Signed)
[facility] [place] NAME: Nathan [name]    MR#:  [id number]  DATE OF BIRTH:  October 08, 1960  SUBJECTIVE:  CHIEF COMPLAINT:   Chief Complaint  Patient presents with  . Shortness of Breath  . Leg Swelling    bilateral   Came with shortness of breath and edema.  Patient discharged after and infection and procedure for IND.  Had MRSA bacteremia. No new fever.  X-ray reported pneumonia.  On antibiotics. REVIEW OF SYSTEMS:   CONSTITUTIONAL: No fever, fatigue or weakness.  EYES: No blurred or double vision.  EARS, NOSE, AND THROAT: No tinnitus or ear pain.  RESPIRATORY: Reporting cough, shortness of breath, denies wheezing or hemoptysis.  CARDIOVASCULAR: No chest pain, orthopnea, edema.  GASTROINTESTINAL: No nausea, vomiting, diarrhea or abdominal pain.  GENITOURINARY: No dysuria, hematuria.  ENDOCRINE: No polyuria, nocturia,  HEMATOLOGY: No anemia, easy bruising or bleeding SKIN: No rash or lesion.  Patient admits lower extremity swelling MUSCULOSKELETAL: No joint pain or arthritis.   NEUROLOGIC: No tingling, numbness, weakness.  PSYCHIATRY: No anxiety or depression.  ROS  DRUG ALLERGIES:  No Known Allergies  VITALS:  Blood pressure (!) 175/93, pulse 75, temperature 98.5 F (36.9 C), temperature source Oral, resp. rate 19, height 5\' 9"  (1.753 m), weight 76.5 kg, SpO2 96 %.  PHYSICAL EXAMINATION:   GENERAL:  58 y.o.-year-old patient lying in the bed with no acute distress.  EYES: Pupils equal, round, reactive to light and accommodation. No scleral icterus. Extraocular muscles intact.  HEENT: Head atraumatic, normocephalic. Oropharynx and nasopharynx clear.  NECK:  Supple, no jugular venous distention. No thyroid enlargement, no tenderness.  LUNGS: Moderate breath sounds bilaterally, decreased on left lower lobe, no wheezing, rales,rhonchi.  Positive crepitation. No use of accessory muscles of respiration.  CARDIOVASCULAR: S1, S2  normal. No murmurs, rubs, or gallops.  ABDOMEN: Soft, nontender, nondistended. Bowel sounds present.  EXTREMITIES: 2+ pedal edema, no cyanosis, or clubbing.  Right hand is in dressing after surgery last week. NEUROLOGIC: Cranial nerves II through XII are intact. Muscle strength 5/5 in all extremities. Sensation intact. Gait not checked.  PSYCHIATRIC: The patient is alert and oriented x 3.  SKIN: No obvious rash, lesion, or ulcer.    Physical Exam LABORATORY PANEL:   CBC Recent Labs  Lab 06/19/19 1349  WBC 12.2*  HGB 8.7*  HCT 27.2*  PLT 878*   ------------------------------------------------------------------------------------------------------------------  Chemistries  Recent Labs  Lab 06/20/19 0407  NA 141  K 5.4*  CL 116*  CO2 20*  GLUCOSE 138*  BUN 51*  CREATININE 1.48*  CALCIUM 8.0*  AST 21  ALT 25  ALKPHOS 287*  BILITOT 0.6   ------------------------------------------------------------------------------------------------------------------  Cardiac Enzymes No results for input(s): TROPONINI in the last 168 hours. ------------------------------------------------------------------------------------------------------------------  RADIOLOGY:  Dg Chest 2 View  Result Date: 06/19/2019 CLINICAL DATA:  Shortness of breath EXAM: CHEST - 2 VIEW COMPARISON:  None. FINDINGS: There is airspace consolidation throughout much of the left lower lobe with small left pleural effusion. There is a subtle ill-defined area of opacity in the right upper lobe, seen only on the frontal view. Lungs elsewhere clear. Heart size and pulmonary vascularity are normal. No adenopathy. No bone lesions. IMPRESSION: 1. Extensive left lower lobe airspace consolidation consistent with pneumonia with small left pleural effusion. 2. Subtle area of opacity in the right upper lobe measuring 2.1 x 1.7 cm. Question small focus of pneumonia in this area. This area does appear somewhat nodular. 3.  Heart  size  within normal limits. 4.  No evident adenopathy. Followup PA and lateral chest radiographs recommended in 3-4 weeks following trial of antibiotic therapy to ensure resolution and exclude underlying malignancy. Electronically Signed   By: Lowella Grip III M.D.   On: 06/19/2019 14:14    ASSESSMENT AND PLAN:   Active Problems:   Acute CHF (congestive heart failure) (HCC)  #Healthcare associated pneumonia-suspected admission but question able now.  IV Rocephin and vancomycin were given in the ED will continue  on Zyvox  Sputum culture and sensitivity ID consult placed to Dr. Levester Fresh,- continue Zyvox COVID negative. Procalcitonin is not high.  White blood cell count is not high.  Patient does not have fever.  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  #Hyperkalemia given Lokelma and Lasix Check a.m. labs  #Recent history of right hand cellulitis with MRSA bacteremia and MRSA abscess of the right thumb TEE was negative during the previous admission Patient was seen by infectious disease Dr. Levester Fresh and patient was discharged home on linezolid for a total of 14 days on the day of discharge We will continue the same and ID consult placed Called orthopedic consult as patient was supposed to go for suture removal.  #Chronic atrial fibrillation rate controlled Continue baby aspirin patient stopped taking Eliquis will resume the same Case management consult placed regarding medication assistance with Eliquis patient states he cannot afford it  #Insulin requiring diabetes mellitus Sliding scale insulin and continue Levemir   DVT prophylaxis with Eliquis    All the records are reviewed and case  discussed with Care Management/Social Workerr. Management plans discussed with the patient, family and they are in agreement.  CODE STATUS: Full  TOTAL TIME TAKING CARE OF THIS PATIENT: 35 minutes.     POSSIBLE D/C IN 1-2 DAYS, DEPENDING ON CLINICAL CONDITION.   Vaughan Basta M.D on 06/21/2019   Between 7am to 6pm - Pager - [phone]  After 6pm go to [email or web address] - password [email or web address] [facility]  Office  [phone]  CC: Primary care physician; Patient, No Pcp Per  Note: This dictation was prepared with Dragon dictation along with smaller phrase technology. Any transcriptional errors that result from this process are unintentional.

## 2019-06-21 NOTE — Consult Note (Signed)
ORTHOPAEDICS:  The patient is s/p I&D of right thenar compartment for abscess. He has continued antibiotics as instructed. He denies any fevers, chills, erythema or drainage from the incisions.  Right hand: Surgical incisions are well approximated. No erythema or swelling. No evidence of drainage. No fluctuance to palpation. Good grip strenrth.  Impression: S/p I&D of right thenar compartment for abscess   Plan: OK to discontinue dressing. Nurses may remove sutures and apply steristrips. Follow-up in the office in 7-10 days. Wound care discussed with patient.  James P. Holley Bouche M.D.

## 2019-06-21 NOTE — Progress Notes (Signed)
[facility] NAME: Nathan [name]    MR#:  [id number]  DATE OF BIRTH:  [birth year]  SUBJECTIVE:  CHIEF COMPLAINT:   Chief Complaint  Patient presents with  . Shortness of Breath  . Leg Swelling    bilateral   Came with shortness of breath and edema.  Patient discharged after and infection and procedure for IND.  Had MRSA bacteremia. No new fever.  X-ray reported pneumonia.  On antibiotics. Later ID Dr. suggested this is not look like pneumonia and likely fluid collection secondary to CHF so antibiotics were changed back to linezolid what he was for his abscess on hand. He overall feels better and does not have much swelling now on legs. REVIEW OF SYSTEMS:   CONSTITUTIONAL: No fever, fatigue or weakness.  EYES: No blurred or double vision.  EARS, NOSE, AND THROAT: No tinnitus or ear pain.  RESPIRATORY: Reporting cough, shortness of breath, denies wheezing or hemoptysis.  CARDIOVASCULAR: No chest pain, orthopnea, edema.  GASTROINTESTINAL: No nausea, vomiting, diarrhea or abdominal pain.  GENITOURINARY: No dysuria, hematuria.  ENDOCRINE: No polyuria, nocturia,  HEMATOLOGY: No anemia, easy bruising or bleeding SKIN: No rash or lesion.  Patient admits lower extremity swelling MUSCULOSKELETAL: No joint pain or arthritis.   NEUROLOGIC: No tingling, numbness, weakness.  PSYCHIATRY: No anxiety or depression.  ROS  DRUG ALLERGIES:  No Known Allergies  VITALS:  Blood pressure (!) 175/93, pulse 75, temperature 98.5 F (36.9 C), temperature source Oral, resp. rate 19, height 5\' 9"  (1.753 m), weight 76.5 kg, SpO2 96 %.  PHYSICAL EXAMINATION:   GENERAL:  58 y.o.-year-old patient lying in the bed with no acute distress.  EYES: Pupils equal, round, reactive to light and accommodation. No scleral icterus. Extraocular muscles intact.  HEENT: Head atraumatic, normocephalic. Oropharynx and nasopharynx clear.  NECK:  Supple, no jugular  venous distention. No thyroid enlargement, no tenderness.  LUNGS: Moderate breath sounds bilaterally, decreased on left lower lobe, no wheezing, rales,rhonchi.  Positive crepitation. No use of accessory muscles of respiration.  CARDIOVASCULAR: S1, S2 normal. No murmurs, rubs, or gallops.  ABDOMEN: Soft, nontender, nondistended. Bowel sounds present.  EXTREMITIES: 2+ pedal edema, no cyanosis, or clubbing.  Right hand is in dressing after surgery last week. NEUROLOGIC: Cranial nerves II through XII are intact. Muscle strength 5/5 in all extremities. Sensation intact. Gait not checked.  PSYCHIATRIC: The patient is alert and oriented x 3.  SKIN: No obvious rash, lesion, or ulcer.    Physical Exam LABORATORY PANEL:   CBC Recent Labs  Lab 06/19/19 1349  WBC 12.2*  HGB 8.7*  HCT 27.2*  PLT 878*   ------------------------------------------------------------------------------------------------------------------  Chemistries  Recent Labs  Lab 06/20/19 0407 06/21/19 0552  NA 141 141  K 5.4* 5.2*  CL 116* 113*  CO2 20* 20*  GLUCOSE 138* 91  BUN 51* 58*  CREATININE 1.48* 1.65*  CALCIUM 8.0* 8.1*  AST 21  --   ALT 25  --   ALKPHOS 287*  --   BILITOT 0.6  --    ------------------------------------------------------------------------------------------------------------------  Cardiac Enzymes No results for input(s): TROPONINI in the last 168 hours. ------------------------------------------------------------------------------------------------------------------  RADIOLOGY:  Dg Chest 2 View  Result Date: 06/21/2019 CLINICAL DATA:  Pleural effusion EXAM: CHEST - 2 VIEW COMPARISON:  06/19/2019 FINDINGS: No significant change in moderate left, small right pleural effusions with associated atelectasis or consolidation of the left lung. There is some degree of underlying diffuse heterogeneous and interstitial bilateral airspace opacity  suggesting multifocal infection or edema. The heart  and mediastinum are unremarkable. IMPRESSION: No significant change in moderate left, small right pleural effusions with associated atelectasis or consolidation of the left lung. There is some degree of underlying diffuse heterogeneous and interstitial bilateral airspace opacity suggesting multifocal infection or edema. CT may be helpful to further assess. Electronically Signed   By: Eddie Candle M.D.   On: 06/21/2019 09:57   Ct Chest Wo Contrast  Result Date: 06/21/2019 CLINICAL DATA:  Chest x-ray today showed small right pleural effusions with associated atelectasis or consolidation of the left lung. There is some degree of underlying diffuse heterogeneous and interstitial bilateral airspace opacity suggesting multifocal infection or edema. EXAM: CT CHEST WITHOUT CONTRAST TECHNIQUE: Multidetector CT imaging of the chest was performed following the standard protocol without IV contrast. COMPARISON:  Chest x-ray on 06/21/2019 FINDINGS: Cardiovascular: Heart is mildly enlarged. There is coronary artery calcification. Small pericardial effusion measures less than 5 millimeters. There is atherosclerotic calcification of the thoracic aorta not associated with aneurysm. Mediastinum/Nodes: The visualized portion of the thyroid gland has a normal appearance. Multiple enlarged mediastinal and hilar lymph nodes are present. Prevascular lymph node is 1.4 centimeters on image 71/2. Precarinal lymph node is 1.2 centimeters on image 58/2. Esophagus is normal in appearance. Lungs/Pleura: Bilateral pleural effusions. RIGHT pleural effusion appears simple. Effusion in the LEFT lung appears loculated and somewhat irregular. The findings raise a question possible empyema. Lack of intravenous contrast limits full evaluation. There is consolidation in the LEFT LOWER lobe and to a lesser degree in the RIGHT LOWER lobe. There are patchy ground-glass opacities within the RIGHT UPPER lobe, somewhat confluent in areas and favoring  infectious process. There is subpleural septal thickening primarily within the UPPER lobes. Upper Abdomen: Small amount of perihepatic fluid. Gallbladder is present. Musculoskeletal: No chest wall mass or suspicious bone lesions identified. IMPRESSION: 1. Cardiomegaly and coronary artery disease. 2. Small pericardial effusion. 3. Bilateral pleural effusions, left greater than RIGHT. Possible LEFT empyema. 4. Bilateral lower lobe consolidation and ground-glass opacities within the RIGHT UPPER lobe, consistent with infectious process. 5. Mediastinal and hilar adenopathy, likely reactive. 6. Mild pulmonary edema. 7. Small amount of perihepatic fluid. 8. Aortic Atherosclerosis (ICD10-I70.0). Electronically Signed   By: Nolon Nations M.D.   On: 06/21/2019 14:21    ASSESSMENT AND PLAN:   Active Problems:   Acute CHF (congestive heart failure) (HCC)  #Healthcare associated pneumonia-suspected admission but question able now.  IV Rocephin and vancomycin were given in the ED will continue  on Zyvox  Sputum culture and sensitivity ID consult placed to Dr. Levester Fresh, changed back to Zyvox COVID negative. Procalcitonin is not high.  White blood cell count is not high.  Patient does not have fever. Today CT scan chest reported collection on left could be empyema with right upper lobe possible infiltrate. Called IR and pulmonology consult.  He might need draining pleural effusion on left, stopped his Eliquis for now( 06/21/19) and change it to Lovenox subcu therapeutic dose.  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  #Hyperkalemia given Lokelma and Lasix Check a.m. labs  #Recent history of right hand cellulitis with MRSA  bacteremia and MRSA abscess of the right thumb TEE was negative during  the previous admission Patient was seen by infectious disease Dr. Levester Fresh and patient was discharged home on linezolid for a total of 14 days on the day of discharge Called orthopedic consult as patient was supposed to go for suture removal. Ortho suggested good healing and advised to take the sutures off.  ID suggested to continue and finish the course of oral linezolid.  #Chronic atrial fibrillation rate controlled Continue baby aspirin patient stopped taking Eliquis will resume the same Case management consult placed regarding medication assistance with Eliquis patient states he cannot afford it. Currently Eliquis held because of possible requirement of thoracentesis.  #Insulin requiring diabetes mellitus Sliding scale insulin and continue Levemir   DVT prophylaxis with Lovenox subcu   All the records are reviewed and case discussed with Care Management/Social Workerr. Management plans discussed with the patient, family and they are in agreement.  CODE STATUS: Full  TOTAL TIME TAKING CARE OF THIS PATIENT: 35 minutes.  Discussed with ID and pulmonary physician and explained the patient about findings on CT chest.  POSSIBLE D/C IN 1-2 DAYS, DEPENDING ON CLINICAL CONDITION.   Vaughan Basta M.D on 06/21/2019   Between 7am to 6pm - Pager - [phone]  After 6pm go to [email or web address] - password [email or web address] [facility]  Office  [phone]  CC: Primary care physician; Patient, No Pcp Per  Note: This dictation was prepared with Dragon dictation along with smaller phrase technology. Any transcriptional errors that result from this process are unintentional.

## 2019-06-21 NOTE — Plan of Care (Signed)
  Problem: Clinical Measurements: Goal: Ability to maintain clinical measurements within normal limits will improve Outcome: Progressing Goal: Cardiovascular complication will be avoided Outcome: Progressing   Problem: Activity: Goal: Risk for activity intolerance will decrease Outcome: Progressing   Problem: Safety: Goal: Ability to remain free from injury will improve Outcome: Progressing   Problem: Education: Goal: Ability to demonstrate management of disease process will improve Outcome: Progressing   Problem: Cardiac: Goal: Ability to achieve and maintain adequate cardiopulmonary perfusion will improve Outcome: Progressing

## 2019-06-21 NOTE — Progress Notes (Signed)
CT chest reviewed  Recommend VIR consultation for pigtail placement Recommend CT surgery evaluation  Message sent to Dr Reva Bores

## 2019-06-22 ENCOUNTER — Inpatient Hospital Stay: Payer: Self-pay

## 2019-06-22 ENCOUNTER — Encounter: Payer: Self-pay | Admitting: Physician Assistant

## 2019-06-22 DIAGNOSIS — R601 Generalized edema: Secondary | ICD-10-CM

## 2019-06-22 DIAGNOSIS — Z978 Presence of other specified devices: Secondary | ICD-10-CM

## 2019-06-22 DIAGNOSIS — R0602 Shortness of breath: Secondary | ICD-10-CM

## 2019-06-22 DIAGNOSIS — J189 Pneumonia, unspecified organism: Secondary | ICD-10-CM

## 2019-06-22 DIAGNOSIS — A419 Sepsis, unspecified organism: Secondary | ICD-10-CM

## 2019-06-22 LAB — PROTEIN, PLEURAL OR PERITONEAL FLUID: Total protein, fluid: <3 g/dL

## 2019-06-22 LAB — CBC
HCT: 25.6 % — ABNORMAL LOW (ref 39.0–52.0)
Hemoglobin: 8 g/dL — ABNORMAL LOW (ref 13.0–17.0)
MCH: 28.9 pg (ref 26.0–34.0)
MCHC: 31.3 g/dL (ref 30.0–36.0)
MCV: 92.4 fL (ref 80.0–100.0)
Platelets: 496 10*3/uL — ABNORMAL HIGH (ref 150–400)
RBC: 2.77 MIL/uL — ABNORMAL LOW (ref 4.22–5.81)
RDW: 13.2 % (ref 11.5–15.5)
WBC: 6.6 10*3/uL (ref 4.0–10.5)
nRBC: 0 % (ref 0.0–0.2)

## 2019-06-22 LAB — BASIC METABOLIC PANEL
Anion gap: 6 (ref 5–15)
BUN: 60 mg/dL — ABNORMAL HIGH (ref 6–20)
CO2: 21 mmol/L — ABNORMAL LOW (ref 22–32)
Calcium: 7.9 mg/dL — ABNORMAL LOW (ref 8.9–10.3)
Chloride: 116 mmol/L — ABNORMAL HIGH (ref 98–111)
Creatinine, Ser: 1.73 mg/dL — ABNORMAL HIGH (ref 0.61–1.24)
GFR calc Af Amer: 50 mL/min — ABNORMAL LOW (ref >60)
GFR calc non Af Amer: 43 mL/min — ABNORMAL LOW (ref >60)
Glucose, Bld: 128 mg/dL — ABNORMAL HIGH (ref 70–99)
Potassium: 4.8 mmol/L (ref 3.5–5.1)
Sodium: 143 mmol/L (ref 135–145)

## 2019-06-22 LAB — GLUCOSE, CAPILLARY
Glucose-Capillary: 122 mg/dL — ABNORMAL HIGH (ref 70–99)
Glucose-Capillary: 107 mg/dL — ABNORMAL HIGH (ref 70–99)
Glucose-Capillary: 125 mg/dL — ABNORMAL HIGH (ref 70–99)
Glucose-Capillary: 142 mg/dL — ABNORMAL HIGH (ref 70–99)

## 2019-06-22 LAB — BODY FLUID CELL COUNT WITH DIFFERENTIAL
Eos, Fluid: 0 %
Lymphs, Fluid: 44 %
Monocyte-Macrophage-Serous Fluid: 19 %
Neutrophil Count, Fluid: 37 %
Total Nucleated Cell Count, Fluid: 234 cu mm

## 2019-06-22 LAB — PROTIME-INR
INR: 1.5 — ABNORMAL HIGH (ref 0.8–1.2)
Prothrombin Time: 17.5 seconds — ABNORMAL HIGH (ref 11.4–15.2)

## 2019-06-22 LAB — LACTATE DEHYDROGENASE, PLEURAL OR PERITONEAL FLUID: LD, Fluid: 119 U/L — ABNORMAL HIGH (ref 3–23)

## 2019-06-22 LAB — LACTATE DEHYDROGENASE: LDH: 178 U/L (ref 98–192)

## 2019-06-22 LAB — GLUCOSE, PLEURAL OR PERITONEAL FLUID: Glucose, Fluid: 102 mg/dL

## 2019-06-22 MED ORDER — MIDAZOLAM HCL 2 MG/2ML IJ SOLN
INTRAMUSCULAR | Status: AC | PRN
  Administered 2019-06-22: 1 mg via INTRAVENOUS

## 2019-06-22 MED ORDER — FENTANYL CITRATE (PF) 100 MCG/2ML IJ SOLN
INTRAMUSCULAR | Status: AC | PRN
  Administered 2019-06-22: 50 ug via INTRAVENOUS

## 2019-06-22 MED ORDER — ALBUMIN HUMAN 25 % IV SOLN
25.0000 g | Freq: Once | INTRAVENOUS | Status: AC
  Administered 2019-06-22: 25 g via INTRAVENOUS
  Filled 2019-06-22: qty 100

## 2019-06-22 MED ORDER — APIXABAN 5 MG PO TABS
5.0000 mg | ORAL_TABLET | Freq: Two times a day (BID) | ORAL | Status: DC
  Administered 2019-06-24: 5 mg via ORAL
  Filled 2019-06-22 (×3): qty 1

## 2019-06-22 MED ORDER — INSULIN ASPART 100 UNIT/ML ~~LOC~~ SOLN
0.0000 [IU] | Freq: Three times a day (TID) | SUBCUTANEOUS | Status: DC
  Administered 2019-06-22: 18:00:00 1 [IU] via SUBCUTANEOUS
  Administered 2019-06-23: 2 [IU] via SUBCUTANEOUS
  Administered 2019-06-23: 1 [IU] via SUBCUTANEOUS
  Administered 2019-06-24: 2 [IU] via SUBCUTANEOUS
  Administered 2019-06-24: 1 [IU] via SUBCUTANEOUS
  Filled 2019-06-22 (×5): qty 1

## 2019-06-22 MED ORDER — FENTANYL CITRATE (PF) 100 MCG/2ML IJ SOLN
INTRAMUSCULAR | Status: AC
  Filled 2019-06-22: qty 2

## 2019-06-22 MED ORDER — MIDAZOLAM HCL 2 MG/2ML IJ SOLN
INTRAMUSCULAR | Status: AC
  Filled 2019-06-22: qty 2

## 2019-06-22 MED ORDER — IRON DEXTRAN 50 MG/ML IJ SOLN
100.0000 mg | Freq: Once | INTRAMUSCULAR | Status: AC
  Administered 2019-06-22: 19:00:00 100 mg via INTRAVENOUS
  Filled 2019-06-22 (×2): qty 2

## 2019-06-22 MED ORDER — INSULIN ASPART 100 UNIT/ML ~~LOC~~ SOLN: 0.0000 [IU] | Freq: Every day | SUBCUTANEOUS | Status: DC

## 2019-06-22 NOTE — Progress Notes (Signed)
Date of Admission:  06/19/2019       Subjective: Had left chest drain No fever  Sob Cough better  Medications:  . amLODipine  5 mg Oral Daily  . fentaNYL      . insulin aspart  0-5 Units Subcutaneous QHS  . insulin aspart  0-9 Units Subcutaneous TID WC  . insulin glargine  10 Units Subcutaneous Daily  . linezolid  600 mg Oral Q12H  . metoprolol succinate  100 mg Oral Daily  . midazolam      . mupirocin ointment  1 application Nasal BID  . pneumococcal 23 valent vaccine  0.5 mL Intramuscular Tomorrow-1000  . sodium chloride flush  3 mL Intravenous Q12H    Objective: Vital signs in last 24 hours: Temp:  [97.9 F (36.6 C)-98.5 F (36.9 C)] 97.9 F (36.6 C) (10/02 1049) Pulse Rate:  [70-78] 70 (10/02 1049) Resp:  [14-20] 19 (10/02 1049) BP: (142-175)/(74-92) 163/85 (10/02 1049) SpO2:  [92 %-97 %] 95 % (10/02 1049) Weight:  [75.4 kg] 75.4 kg (10/02 0510)  PHYSICAL EXAM:  General: Alert, cooperative, no distress, appears stated age.  Head: Normocephalic, without obvious abnormality, atraumatic. Eyes: Conjunctivae clear, anicteric sclerae. Pupils are equal ENT Nares normal. No drainage or sinus tenderness. Lips, mucosa, and tongue normal. No Thrush Neck: Supple, symmetrical, no adenopathy, thyroid: non tender no carotid bruit and no JVD. Back: No CVA tenderness. Lungs: left chest drain. Heart: irregular Abdomen: Soft, non-tender,not distended. Bowel sounds normal. No masses Extremities: atraumatic, no cyanosis. No edema. No clubbing Skin: No rashes or lesions. Or bruising Lymph: Cervical, supraclavicular normal. Neurologic: Grossly non-focal  Lab Results Recent Labs    06/21/19 0552 06/22/19 0604  WBC  --  6.6  HGB  --  8.0*  HCT  --  25.6*  NA 141 143  K 5.2* 4.8  CL 113* 116*  CO2 20* 21*  BUN 58* 60*  CREATININE 1.65* 1.73*   Liver Panel Recent Labs    06/20/19 0407  PROT 5.6*  ALBUMIN 2.2*  AST 21  ALT 25  ALKPHOS 287*  BILITOT 0.6    Sedimentation Rate No results for input(s): ESRSEDRATE in the last 72 hours. C-Reactive Protein No results for input(s): CRP in the last 72 hours.  Pleural fluid cell count 234 ( 44 L, 37 N) Protein< 3 LDH 111 Serum LDH 178  Microbiology:gram stain negative  Studies/Results: Dg Chest 2 View  Result Date: 06/21/2019 CLINICAL DATA:  Pleural effusion EXAM: CHEST - 2 VIEW COMPARISON:  06/19/2019 FINDINGS: No significant change in moderate left, small right pleural effusions with associated atelectasis or consolidation of the left lung. There is some degree of underlying diffuse heterogeneous and interstitial bilateral airspace opacity suggesting multifocal infection or edema. The heart and mediastinum are unremarkable. IMPRESSION: No significant change in moderate left, small right pleural effusions with associated atelectasis or consolidation of the left lung. There is some degree of underlying diffuse heterogeneous and interstitial bilateral airspace opacity suggesting multifocal infection or edema. CT may be helpful to further assess. Electronically Signed   By: Eddie Candle M.D.   On: 06/21/2019 09:57   Ct Chest Wo Contrast  Result Date: 06/21/2019 CLINICAL DATA:  Chest x-ray today showed small right pleural effusions with associated atelectasis or consolidation of the left lung. There is some degree of underlying diffuse heterogeneous and interstitial bilateral airspace opacity suggesting multifocal infection or edema. EXAM: CT CHEST WITHOUT CONTRAST TECHNIQUE: Multidetector CT imaging of the chest was performed following  the standard protocol without IV contrast. COMPARISON:  Chest x-ray on 06/21/2019 FINDINGS: Cardiovascular: Heart is mildly enlarged. There is coronary artery calcification. Small pericardial effusion measures less than 5 millimeters. There is atherosclerotic calcification of the thoracic aorta not associated with aneurysm. Mediastinum/Nodes: The visualized portion of the  thyroid gland has a normal appearance. Multiple enlarged mediastinal and hilar lymph nodes are present. Prevascular lymph node is 1.4 centimeters on image 71/2. Precarinal lymph node is 1.2 centimeters on image 58/2. Esophagus is normal in appearance. Lungs/Pleura: Bilateral pleural effusions. RIGHT pleural effusion appears simple. Effusion in the LEFT lung appears loculated and somewhat irregular. The findings raise a question possible empyema. Lack of intravenous contrast limits full evaluation. There is consolidation in the LEFT LOWER lobe and to a lesser degree in the RIGHT LOWER lobe. There are patchy ground-glass opacities within the RIGHT UPPER lobe, somewhat confluent in areas and favoring infectious process. There is subpleural septal thickening primarily within the UPPER lobes. Upper Abdomen: Small amount of perihepatic fluid. Gallbladder is present. Musculoskeletal: No chest wall mass or suspicious bone lesions identified. IMPRESSION: 1. Cardiomegaly and coronary artery disease. 2. Small pericardial effusion. 3. Bilateral pleural effusions, left greater than RIGHT. Possible LEFT empyema. 4. Bilateral lower lobe consolidation and ground-glass opacities within the RIGHT UPPER lobe, consistent with infectious process. 5. Mediastinal and hilar adenopathy, likely reactive. 6. Mild pulmonary edema. 7. Small amount of perihepatic fluid. 8. Aortic Atherosclerosis (ICD10-I70.0). Electronically Signed   By: Nolon Nations M.D.   On: 06/21/2019 14:21   Ct Image Guided Fluid Drain By Catheter  Result Date: 06/22/2019 INDICATION: Loculated left effusion, concern for empyema EXAM: CT guided 12 French left chest tube insertion MEDICATIONS: The patient is currently admitted to the hospital and receiving intravenous antibiotics. The antibiotics were administered within an appropriate time frame prior to the initiation of the procedure. ANESTHESIA/SEDATION: Fentanyl 50 mcg IV; Versed 1.0 mg IV Moderate Sedation Time:   16 minutes The patient was continuously monitored during the procedure by the interventional radiology nurse under my direct supervision. COMPLICATIONS: None immediate. PROCEDURE: Informed written consent was obtained from the patient after a thorough discussion of the procedural risks, benefits and alternatives. All questions were addressed. Maximal Sterile Barrier Technique was utilized including caps, mask, sterile gowns, sterile gloves, sterile drape, hand hygiene and skin antiseptic. A timeout was performed prior to the initiation of the procedure. Previous imaging reviewed. Patient positioned right side down decubitus. Noncontrast localization CT performed. The loculated left effusion was localized through a mid axillary lower intercostal space. Overlying skin marked. Under sterile conditions and local anesthesia, an 18 gauge 10 cm access needle was advanced into the effusion. Needle position confirmed with CT. Syringe aspiration yielded serous fluid. Sample sent for culture. Guidewire inserted followed by tract dilatation to insert a 12 [place] drain. Drain catheter position confirmed with CT. Catheter secured with Prolene suture and connected to external pleura vac. Sterile dressing applied. No immediate complication. Patient tolerated the procedure well. IMPRESSION: Successful CT-guided 12 French left chest tube insertion. Electronically Signed   By: Jerilynn Mages.  Shick M.D.   On: 06/22/2019 10:48     Assessment/Plan: 58 yr male who was in hospital for 5 days last week admitted with increasing edema below nipple and sob  Left pleural effusion due to fluid overload- CT questioned empyema/pneumonia  but clinically does not fit  doubt he has pneumonia as < 0.10 procalcitonin, no WBC and no fever . Pt has edema legs and edema abd wall/scrotal edema  has BNP of >3000. Has afib Also anemia and low albumin - all contributing to fluid overload and third spacing. Also he receievd IV fluids last admisison and the  edema started then Had Chest tube placed!! Pleural fluid has 242 wbc only 37% N -not an empyema-  continue linezolid as planned to complete 14 days  Recent MRSA bacteremia 1of 4 bottle- repeat was neg TEE was neg for vegetationThe source of the bacteremia was the rt hand abscess- he is on Linezolid- change to PO- will complete a 14 day course- watch closely HB/platelet    Anemia-unclear etiology  AKI ? Amoxicillin induced AIN?  Due to DM ? HTN Check UA to look for proteinuria/eosinophilic count

## 2019-06-22 NOTE — Progress Notes (Signed)
Patient clinically stable post CT Placement per Dr Annamaria Boots, tolerated well. Denies complaints at this time. Serous fluid draining. Received Versed 1mg  along with Fentanyl 41mcg iv for procedure. Report given to Ria Comment RN from telemetry with questions answered. Patient awake/alert and oriented.

## 2019-06-22 NOTE — H&P (Signed)
Chief Complaint:   Loculated left effusion   History of Present Illness: Nathan [name] is a 58 y.o. male with a loculated left effusion and possible PNA.  Plan for CT left chest tube insertion today.   Past Medical History:  Diagnosis Date   Atrial fibrillation ([place])    Diabetes mellitus without complication ([place])    Hypertension     Past Surgical History:  Procedure Laterality Date   I&D EXTREMITY Right 06/11/2019   Procedure: IRRIGATION AND DEBRIDEMENT RIGHT THUMB;  Surgeon: Dereck Leep, MD;  Location: [facility] OR[facility];  Service: Orthopedics;  Laterality: Right;   TEE WITHOUT CARDIOVERSION N/A 06/14/2019   Procedure: TRANSESOPHAGEAL ECHOCARDIOGRAM (TEE);  Surgeon: Minna Merritts, MD;  Location: [facility];  Service: Cardiovascular;  Laterality: N/A;    Allergies: Patient has no known allergies.  Medications: Prior to Admission medications   Medication Sig Start Date End Date Taking? Authorizing Provider  aspirin EC 81 MG tablet Take 81 mg by mouth daily.   Yes [provider]  insulin glargine (LANTUS) 100 UNIT/ML injection Inject 0.1 mLs (10 Units total) into the skin daily. 06/16/19  Yes Epifanio Lesches, MD  linezolid (ZYVOX) 600 MG tablet Take 1 tablet (600 mg total) by mouth every 12 (twelve) hours. 06/16/19  Yes Epifanio Lesches, MD  metoprolol succinate (TOPROL-XL) 100 MG 24 hr tablet Take 1 tablet (100 mg total) by mouth daily. Take with or immediately following a meal. 06/16/19  Yes Epifanio Lesches, MD  mupirocin ointment (BACTROBAN) 2 % Place 1 application into the nose 2 (two) times daily. 06/16/19  Yes Epifanio Lesches, MD  oxyCODONE-acetaminophen (PERCOCET) 5-325 MG tablet Take 1 tablet by mouth every 6 (six) hours as needed for moderate pain or severe pain. 06/16/19 06/15/20 Yes Epifanio Lesches, MD  apixaban (ELIQUIS) 5 MG TABS tablet Take 1 tablet (5 mg total) by mouth 2 (two) times daily. Patient not taking: Reported on  06/19/2019 06/16/19   Epifanio Lesches, MD     Family History  Problem Relation Age of Onset   CAD Father    Diabetes Sister    Diabetes Brother    Diabetes Sister    Diabetes Sister     Social History   Socioeconomic History   Marital status: Married    Spouse name: Not on file   Number of children: Not on file   Years of education: Not on file   Highest education level: Not on file  Occupational History   Not on file  Social Needs   Financial resource strain: Not on file   Food insecurity    Worry: Not on file    Inability: Not on file   Transportation needs    Medical: Not on file    Non-medical: Not on file  Tobacco Use   Smoking status: Never Smoker   Smokeless tobacco: Never Used  Substance and Sexual Activity   Alcohol use: Never    Frequency: Never   Drug use: Never   Sexual activity: Not on file  Lifestyle   Physical activity    Days per week: Not on file    Minutes per session: Not on file   Stress: Not on file  Relationships   Social connections    Talks on phone: Not on file    Gets together: Not on file    Attends religious service: Not on file    Active member of club or organization: Not on file  Attends meetings of clubs or organizations: Not on file    Relationship status: Not on file  Other Topics Concern   Not on file  Social History Narrative   Not on file      Review of Systems: A 12 point ROS discussed and pertinent positives are indicated in the HPI above.  All other systems are negative.  Review of Systems  Vital Signs: BP (!) 160/85 (BP Location: Left Arm)    Pulse 73    Temp 98 F (36.7 C) (Oral)    Resp 18    Ht 5\' 9"  (1.753 m)    Wt 75.4 kg    SpO2 95%    BMI 24.56 kg/m   Physical Exam Constitutional:      General: He is not in acute distress.    Appearance: He is not toxic-appearing.  Cardiovascular:     Rate and Rhythm: Normal rate and regular rhythm.  Pulmonary:     Effort: Pulmonary  effort is normal. No tachypnea.     Breath sounds: Decreased breath sounds present.  Abdominal:     General: Bowel sounds are normal.     Palpations: Abdomen is soft.  Neurological:     General: No focal deficit present.     Mental Status: He is alert.     Imaging: Dg Chest 2 View  Result Date: 06/21/2019 CLINICAL DATA:  Pleural effusion EXAM: CHEST - 2 VIEW COMPARISON:  06/19/2019 FINDINGS: No significant change in moderate left, small right pleural effusions with associated atelectasis or consolidation of the left lung. There is some degree of underlying diffuse heterogeneous and interstitial bilateral airspace opacity suggesting multifocal infection or edema. The heart and mediastinum are unremarkable. IMPRESSION: No significant change in moderate left, small right pleural effusions with associated atelectasis or consolidation of the left lung. There is some degree of underlying diffuse heterogeneous and interstitial bilateral airspace opacity suggesting multifocal infection or edema. CT may be helpful to further assess. Electronically Signed   By: Eddie Candle M.D.   On: 06/21/2019 09:57   Dg Chest 2 View  Result Date: 06/19/2019 CLINICAL DATA:  Shortness of breath EXAM: CHEST - 2 VIEW COMPARISON:  None. FINDINGS: There is airspace consolidation throughout much of the left lower lobe with small left pleural effusion. There is a subtle ill-defined area of opacity in the right upper lobe, seen only on the frontal view. Lungs elsewhere clear. Heart size and pulmonary vascularity are normal. No adenopathy. No bone lesions. IMPRESSION: 1. Extensive left lower lobe airspace consolidation consistent with pneumonia with small left pleural effusion. 2. Subtle area of opacity in the right upper lobe measuring 2.1 x 1.7 cm. Question small focus of pneumonia in this area. This area does appear somewhat nodular. 3.  Heart size within normal limits. 4.  No evident adenopathy. Followup PA and lateral chest  radiographs recommended in 3-4 weeks following trial of antibiotic therapy to ensure resolution and exclude underlying malignancy. Electronically Signed   By: Lowella Grip III M.D.   On: 06/19/2019 14:14   Ct Chest Wo Contrast  Result Date: 06/21/2019 CLINICAL DATA:  Chest x-ray today showed small right pleural effusions with associated atelectasis or consolidation of the left lung. There is some degree of underlying diffuse heterogeneous and interstitial bilateral airspace opacity suggesting multifocal infection or edema. EXAM: CT CHEST WITHOUT CONTRAST TECHNIQUE: Multidetector CT imaging of the chest was performed following the standard protocol without IV contrast. COMPARISON:  Chest x-ray on 06/21/2019 FINDINGS: Cardiovascular: Heart  is mildly enlarged. There is coronary artery calcification. Small pericardial effusion measures less than 5 millimeters. There is atherosclerotic calcification of the thoracic aorta not associated with aneurysm. Mediastinum/Nodes: The visualized portion of the thyroid gland has a normal appearance. Multiple enlarged mediastinal and hilar lymph nodes are present. Prevascular lymph node is 1.4 centimeters on image 71/2. Precarinal lymph node is 1.2 centimeters on image 58/2. Esophagus is normal in appearance. Lungs/Pleura: Bilateral pleural effusions. RIGHT pleural effusion appears simple. Effusion in the LEFT lung appears loculated and somewhat irregular. The findings raise a question possible empyema. Lack of intravenous contrast limits full evaluation. There is consolidation in the LEFT LOWER lobe and to a lesser degree in the RIGHT LOWER lobe. There are patchy ground-glass opacities within the RIGHT UPPER lobe, somewhat confluent in areas and favoring infectious process. There is subpleural septal thickening primarily within the UPPER lobes. Upper Abdomen: Small amount of perihepatic fluid. Gallbladder is present. Musculoskeletal: No chest wall mass or suspicious bone  lesions identified. IMPRESSION: 1. Cardiomegaly and coronary artery disease. 2. Small pericardial effusion. 3. Bilateral pleural effusions, left greater than RIGHT. Possible LEFT empyema. 4. Bilateral lower lobe consolidation and ground-glass opacities within the RIGHT UPPER lobe, consistent with infectious process. 5. Mediastinal and hilar adenopathy, likely reactive. 6. Mild pulmonary edema. 7. Small amount of perihepatic fluid. 8. Aortic Atherosclerosis (ICD10-I70.0). Electronically Signed   By: Nolon Nations M.D.   On: 06/21/2019 14:21   Ct Hand Right W Contrast  Result Date: 06/11/2019 CLINICAL DATA:  Sepsis. Right hand swelling. EXAM: CT OF THE UPPER RIGHT EXTREMITY WITH CONTRAST TECHNIQUE: Multidetector CT imaging of the upper right extremity was performed according to the standard protocol following intravenous contrast administration. COMPARISON:  Radiographs dated 06/10/2019 CONTRAST:  149mL OMNIPAQUE IOHEXOL 300 MG/ML  SOLN FINDINGS: Muscles and Tendons and soft tissues There is an extensive abscess extending from the dorsal aspect of the base of the thumb at the site of the soft tissue ulcer near the base of the proximal phalanx. The abscess extends into the palm involving the abductor pollicis brevis and adductor pollicis muscles extending across the palm extending to the volar surface of the head of the third metacarpal best seen on image 85 of series 6. The abscess is at least 6.3 x 3.4 x 2.3 cm. The mass is deep to the flexor tendons of first, second and third digits at the level of the metacarpals. Bones/Joint/Cartilage No evidence of osteomyelitis or other acute abnormality. IMPRESSION: 1. Extensive abscess in the palm extending from the dorsal aspect of the base of the thumb to the volar surface of the head of the third metacarpal. 2. No evidence of osteomyelitis. Electronically Signed   By: Lorriane Shire M.D.   On: 06/11/2019 10:15   Dg Hand Complete Right  Result Date:  06/10/2019 CLINICAL DATA:  Infection in right thumb EXAM: RIGHT HAND - COMPLETE 3+ VIEW COMPARISON:  None. FINDINGS: No fracture or dislocation of the right hand. Joint spaces are well preserved. Soft tissue edema about the right thumb. IMPRESSION: Soft tissue edema about the right thumb. No fracture, dislocation, or other osseous abnormality. Electronically Signed   By: Eddie Candle M.D.   On: 06/10/2019 17:14    Labs:  CBC: Recent Labs    06/15/19 0509 06/16/19 0456 06/19/19 1349 06/22/19 0604  WBC 17.4* 16.4* 12.2* 6.6  HGB 7.6* 8.2* 8.7* 8.0*  HCT 23.6* 25.6* 27.2* 25.6*  PLT 736* 737* 878* 496*    COAGS: Recent Labs  06/11/19 0420 06/19/19 1349 06/22/19 0604  INR 1.3* 1.2 1.5*    BMP: Recent Labs    06/19/19 1349 06/20/19 0407 06/21/19 0552 06/22/19 0604  NA 140 141 141 143  K 5.4* 5.4* 5.2* 4.8  CL 114* 116* 113* 116*  CO2 19* 20* 20* 21*  GLUCOSE 176* 138* 91 128*  BUN 52* 51* 58* 60*  CALCIUM 8.4* 8.0* 8.1* 7.9*  CREATININE 1.60* 1.48* 1.65* 1.73*  GFRNONAA 47* 52* 46* 43*  GFRAA 55* >60 53* 50*    LIVER FUNCTION TESTS: Recent Labs    06/20/19 0407  BILITOT 0.6  AST 21  ALT 25  ALKPHOS 287*  PROT 5.6*  ALBUMIN 2.2*    TUMOR MARKERS: No results for input(s): AFPTM, CEA, CA199, CHROMGRNA in the last 8760 hours.  Assessment and Plan:  Loculated left effusion, concern for developing empyema.  Plan for CT left chest tube placement.  Risks and benefits discussed with the patient including bleeding, infection, damage to adjacent structures, and sepsis.  All of the patient's questions were answered, patient is agreeable to proceed. Consent signed and in chart.    Thank you for this interesting consult.  I greatly enjoyed meeting Nathan [name] and look forward to participating in their care.  A copy of this report was sent to the requesting provider on this date.  Electronically Signed: Greggory Keen, MD 06/22/2019, 9:38 AM   I spent  a total of 20 Minutes    in face to face in clinical consultation, greater than 50% of which was counseling/coordinating care for this inpatient with loculated left effusion.

## 2019-06-22 NOTE — Procedures (Signed)
Loculated effusion  S/p CT left chest 12 fr drain  No comp Stable ebl min Serous fld aspirated cx sent Full report in pacs

## 2019-06-22 NOTE — Consult Note (Signed)
Cardiology Consultation:   Patient ID: Nathan [name]; [id number]; 12-23-1960   Admit date: 06/19/2019 Date of Consult: 06/22/2019  Primary Care Provider: Patient, No Pcp Per Primary Cardiologist: Agbor-Etang   Patient Profile:   Nathan [name] is a 58 y.o. male with a hx of systolic dysfunction, PAF on Eliquis, IDDM, HTN, tobacco abuse who is being seen today for the evaluation of lower extremity swelling/SOB at the request of Dr. Anselm Jungling.  History of Present Illness:   Mr. Oskey has reported a history of Afib that dates back to the early 2000s and was placed on an aspirin at that time, though subsequently discontinued by the patient secondary to headache.   More recently, he was recently admitted 9/20 to 9/26 with right hand cellulitis s/p I/D complicated by MRSA bacteremia. Surface echo at that time showed an EF of 45-50%, normal RVSF, mild MR, mild to moderate TR, mildly elevated PASP. TEE showed no evidence vegetation with an EF of 50-55%. He was treated with Zyvox per IM. He returned to the hospital on 9/29 with increase in bilateral lower extremity swelling and SOB.   Upon his arrival to [facility], with stable BP/HR. His admission weight appears to have been around 76 kg, though accuracy is uncertain. Labs showed a BNP of 3281 with a BUN/SCr 52/1.60-->51/1.48, potassium 5.4-->4.8, albumin 2.2, WBC 12.2-->6.6, HGB 8.7-->8.0 (prior 12.4 from earlier in 2020), PLT 878-->496, blood cultures no growth to date x 2, PCT < 0.10. CXR showed left lower lobe consolidation and pleural effusion. He was continued on previously recommended ABX course and started on IV Lasix 20 mg daily for the past 3 days with noted improvement in renal function as above. CT chest on 10/1 showed possible left sided empyema, bilateral lower lobe consolidation and ground-glass opacities consistent with infectious process, likely reactive mediastinal and hilar adenopathy, mild pulmonary edema, small pericardial  effusion, bilateral pleural effusions with the left being greater than the right, cardiomegaly and CAD. Documented weight of 75.4 kg this morning with a documented UOP of 1.3 L for the admission. He has undergone CT-guided chest tube placement this morning. Currently, notes some improvement in SOB and lower extremity swelling. No chest pain.      Past Medical History:  Diagnosis Date   Atrial fibrillation ([place])    Diabetes mellitus without complication ([place])    Hypertension     Past Surgical History:  Procedure Laterality Date   I&D EXTREMITY Right 06/11/2019   Procedure: IRRIGATION AND DEBRIDEMENT RIGHT THUMB;  Surgeon: Dereck Leep, MD;  Location: [facility] ORS;  Service: Orthopedics;  Laterality: Right;   TEE WITHOUT CARDIOVERSION N/A 06/14/2019   Procedure: TRANSESOPHAGEAL ECHOCARDIOGRAM (TEE);  Surgeon: Minna Merritts, MD;  Location: [facility] ORS;  Service: Cardiovascular;  Laterality: N/A;     Home Meds: Prior to Admission medications   Medication Sig Start Date End Date Taking? Authorizing Provider  aspirin EC 81 MG tablet Take 81 mg by mouth daily.   Yes [provider]  insulin glargine (LANTUS) 100 UNIT/ML injection Inject 0.1 mLs (10 Units total) into the skin daily. 06/16/19  Yes Epifanio Lesches, MD  linezolid (ZYVOX) 600 MG tablet Take 1 tablet (600 mg total) by mouth every 12 (twelve) hours. 06/16/19  Yes Epifanio Lesches, MD  metoprolol succinate (TOPROL-XL) 100 MG 24 hr tablet Take 1 tablet (100 mg total) by mouth daily. Take with or immediately following a meal. 06/16/19  Yes Epifanio Lesches, MD  mupirocin ointment (BACTROBAN) 2 %  Place 1 application into the nose 2 (two) times daily. 06/16/19  Yes Epifanio Lesches, MD  oxyCODONE-acetaminophen (PERCOCET) 5-325 MG tablet Take 1 tablet by mouth every 6 (six) hours as needed for moderate pain or severe pain. 06/16/19 06/15/20 Yes Epifanio Lesches, MD  apixaban (ELIQUIS) 5 MG TABS tablet Take 1 tablet  (5 mg total) by mouth 2 (two) times daily. Patient not taking: Reported on 06/19/2019 06/16/19   Epifanio Lesches, MD    Inpatient Medications: Scheduled Meds:  amLODipine  5 mg Oral Daily   fentaNYL       furosemide  20 mg Intravenous Daily   insulin glargine  10 Units Subcutaneous Daily   linezolid  600 mg Oral Q12H   metoprolol succinate  100 mg Oral Daily   midazolam       mupirocin ointment  1 application Nasal BID   pneumococcal 23 valent vaccine  0.5 mL Intramuscular Tomorrow-1000   sodium chloride flush  3 mL Intravenous Q12H   Continuous Infusions:  PRN Meds: labetalol  Allergies:  No Known Allergies  Social History:   Social History   Socioeconomic History   Marital status: Married    Spouse name: Not on file   Number of children: Not on file   Years of education: Not on file   Highest education level: Not on file  Occupational History   Not on file  Social Needs   Financial resource strain: Not on file   Food insecurity    Worry: Not on file    Inability: Not on file   Transportation needs    Medical: Not on file    Non-medical: Not on file  Tobacco Use   Smoking status: Never Smoker   Smokeless tobacco: Never Used  Substance and Sexual Activity   Alcohol use: Never    Frequency: Never   Drug use: Never   Sexual activity: Not on file  Lifestyle   Physical activity    Days per week: Not on file    Minutes per session: Not on file   Stress: Not on file  Relationships   Social connections    Talks on phone: Not on file    Gets together: Not on file    Attends religious service: Not on file    Active member of club or organization: Not on file    Attends meetings of clubs or organizations: Not on file    Relationship status: Not on file   Intimate partner violence    Fear of current or ex partner: Not on file    Emotionally abused: Not on file    Physically abused: Not on file    Forced sexual activity: Not on  file  Other Topics Concern   Not on file  Social History Narrative   Not on file     Family History:  Family History  Problem Relation Age of Onset   CAD Father    Diabetes Sister    Diabetes Brother    Diabetes Sister    Diabetes Sister     ROS:  Review of Systems  Constitutional: Positive for malaise/fatigue. Negative for chills, diaphoresis, fever and weight loss.  HENT: Negative for congestion.   Eyes: Negative for discharge and redness.  Respiratory: Positive for shortness of breath. Negative for cough, hemoptysis, sputum production and wheezing.   Cardiovascular: Positive for leg swelling. Negative for chest pain, palpitations, orthopnea, claudication and PND.  Gastrointestinal: Negative for abdominal pain, blood in stool, heartburn, melena, nausea  and vomiting.  Genitourinary: Negative for hematuria.  Musculoskeletal: Negative for falls and myalgias.  Skin: Negative for rash.  Neurological: Positive for weakness. Negative for dizziness, tingling, tremors, sensory change, speech change, focal weakness and loss of consciousness.  Endo/Heme/Allergies: Does not bruise/bleed easily.  Psychiatric/Behavioral: Negative for substance abuse. The patient is not nervous/anxious.   All other systems reviewed and are negative.     Physical Exam/Data:   Vitals:   06/21/19 1820 06/21/19 2000 06/22/19 0510 06/22/19 0827  BP: (!) 149/80 (!) 152/82 (!) 147/84 (!) 160/85  Pulse: 72 78 74 73  Resp:  16 16 18   Temp:  98.1 F (36.7 C) 98.5 F (36.9 C) 98 F (36.7 C)  TempSrc:  Oral Oral Oral  SpO2:  95% 95% 95%  Weight:   75.4 kg   Height:        Intake/Output Summary (Last 24 hours) at 06/22/2019 0912 Last data filed at 06/22/2019 0513 Gross per 24 hour  Intake --  Output 425 ml  Net -425 ml   Filed Weights   06/19/19 2211 06/21/19 0442 06/22/19 0510  Weight: 76.4 kg 76.5 kg 75.4 kg   Body mass index is 24.56 kg/m.   Physical Exam: General: Well developed, well  nourished, in no acute distress. Head: Normocephalic, atraumatic, sclera non-icteric, no xanthomas, nares without discharge.  Neck: Negative for carotid bruits. JVD not elevated. Lungs: Diminished breath sounds along the left base. Coarse breath sounds bilaterally. Breathing is unlabored. Heart: RRR with S1 S2. No murmurs, rubs, or gallops appreciated. Abdomen: Soft, non-tender, non-distended with normoactive bowel sounds. No hepatomegaly. No rebound/guarding. No obvious abdominal masses. Msk:  Strength and tone appear normal for age. Extremities: No clubbing or cyanosis. Trace bilateral pedal edema with scrotal swelling. Distal pedal pulses are 2+ and equal bilaterally. Neuro: Alert and oriented X 3. No facial asymmetry. No focal deficit. Moves all extremities spontaneously. Psych:  Responds to questions appropriately with a normal affect.   EKG:  The EKG was personally reviewed and demonstrates: 9/29 - NSR, 83 bpm, low voltage QRS, baseline wandering, nonspecific inferior st/t changes. 9/23 - Afib with RVR, 162 bpm, right axis deviation, nonspecific st/t changes Telemetry:  Telemetry was personally reviewed and demonstrates: SR  Weights: [employer]   06/19/19 2211 06/21/19 0442 06/22/19 0510  Weight: 76.4 kg 76.5 kg 75.4 kg    Relevant CV Studies: 2D Echo 06/13/2019: 1. Left ventricular ejection fraction, by visual estimation, is 45 to 50%. The left ventricle has mildly decreased function. Normal left ventricular size. There is no left ventricular hypertrophy.  2. Global right ventricle has normal systolic function.The right ventricular size is normal. No increase in right ventricular wall thickness.  3. Left atrial size was normal.  4. Right atrial size was normal.  5. The mitral valve is normal in structure. Mild mitral valve regurgitation. No evidence of mitral stenosis.  6. The tricuspid valve is normal in structure. Tricuspid valve regurgitation mild-moderate.  7. The aortic  valve is normal in structure. Aortic valve regurgitation was not visualized by color flow Doppler. Structurally normal aortic valve, with no evidence of sclerosis or stenosis.  8. The pulmonic valve was normal in structure. Pulmonic valve regurgitation is not visualized by color flow Doppler.  9. Mildly elevated pulmonary artery systolic pressure. 10. The inferior vena cava is normal in size with greater than 50% respiratory variability, suggesting right atrial pressure of 3 mmHg. __________  TEE 06/14/2019: 1. Left ventricular ejection fraction, by visual estimation, is  50 to 55%. The left ventricle has normal function. There is mildly increased left ventricular hypertrophy.  2. Global right ventricle has normal systolic function.The right ventricular size is normal. No increase in right ventricular wall thickness.  3. Left atrial size was normal.  4. No valve vegetation noted.  Laboratory Data:  Chemistry Recent Labs  Lab 06/20/19 0407 06/21/19 0552 06/22/19 0604  NA 141 141 143  K 5.4* 5.2* 4.8  CL 116* 113* 116*  CO2 20* 20* 21*  GLUCOSE 138* 91 128*  BUN 51* 58* 60*  CREATININE 1.48* 1.65* 1.73*  CALCIUM 8.0* 8.1* 7.9*  GFRNONAA 52* 46* 43*  GFRAA >60 53* 50*  ANIONGAP 5 8 6     Recent Labs  Lab 06/20/19 0407  PROT 5.6*  ALBUMIN 2.2*  AST 21  ALT 25  ALKPHOS 287*  BILITOT 0.6   Hematology Recent Labs  Lab 06/16/19 0456 06/19/19 1349 06/22/19 0604  WBC 16.4* 12.2* 6.6  RBC 2.83* 2.96* 2.77*  HGB 8.2* 8.7* 8.0*  HCT 25.6* 27.2* 25.6*  MCV 90.5 91.9 92.4  MCH 29.0 29.4 28.9  MCHC 32.0 32.0 31.3  RDW 12.4 13.2 13.2  PLT 737* 878* 496*   Cardiac EnzymesNo results for input(s): TROPONINI in the last 168 hours. No results for input(s): TROPIPOC in the last 168 hours.  BNP Recent Labs  Lab 06/19/19 1750  BNP 3,281.0*    DDimer No results for input(s): DDIMER in the last 168 hours.  Radiology/Studies:  Dg Chest 2 View  Result Date:  06/21/2019 IMPRESSION: No significant change in moderate left, small right pleural effusions with associated atelectasis or consolidation of the left lung. There is some degree of underlying diffuse heterogeneous and interstitial bilateral airspace opacity suggesting multifocal infection or edema. CT may be helpful to further assess. Electronically Signed   By: Eddie Candle M.D.   On: 06/21/2019 09:57   Dg Chest 2 View  Result Date: 06/19/2019 IMPRESSION: 1. Extensive left lower lobe airspace consolidation consistent with pneumonia with small left pleural effusion. 2. Subtle area of opacity in the right upper lobe measuring 2.1 x 1.7 cm. Question small focus of pneumonia in this area. This area does appear somewhat nodular. 3.  Heart size within normal limits. 4.  No evident adenopathy. Followup PA and lateral chest radiographs recommended in 3-4 weeks following trial of antibiotic therapy to ensure resolution and exclude underlying malignancy. Electronically Signed   By: Lowella Grip III M.D.   On: 06/19/2019 14:14   Ct Chest Wo Contrast  Result Date: 06/21/2019 IMPRESSION: 1. Cardiomegaly and coronary artery disease. 2. Small pericardial effusion. 3. Bilateral pleural effusions, left greater than RIGHT. Possible LEFT empyema. 4. Bilateral lower lobe consolidation and ground-glass opacities within the RIGHT UPPER lobe, consistent with infectious process. 5. Mediastinal and hilar adenopathy, likely reactive. 6. Mild pulmonary edema. 7. Small amount of perihepatic fluid. 8. Aortic Atherosclerosis (ICD10-I70.0). Electronically Signed   By: Nolon Nations M.D.   On: 06/21/2019 14:21    Assessment and Plan:   1. Pleural effusion/lower extremity swelling/SOB: -Likely multifactorial including third spacing from hypoalbuminemia, anemia, volume overload with recent IV fluids in the setting of his bacteremia with possible mild component of systolic dysfunction, and exacerbated by amlodipine -Maintaining  sinus rhythm this admission -Less likely primary CHF exacerbation -Has received IV Lasix the past 3 days with up-trending renal function each day -Hold IV Lasix today with worsening renal function -Can follow up as outpatient for discussion of ischemic evaluation  -  Recommend correction of albumin along with further evaluation of his anemia -Would change amlodipine to alternative antihypertensive unless IM has strong recommendations for this medication  -Trace pedal edema with scrotal swelling likely in the setting of third spacing  2. Possible left sided empyema/pulmonary consolidation/ground-glass opacities: -Concerning for infection -Consider repeating COVID-19 testing, defer to IM -For CT-guided chest tube placement today for loculated effusion -Per IM  3. PAF: -Maintaining sinus rhythm this admission -Eliquis on hold as of 10/1 AM for IR, resume when able pending HGB trend -CHADS2VASc 3 (HTN, DM, vascular disease) -Continue Toprol XL for rate control   4. AKI: -Baseline appears to be around 1.1-1.3 per Care Everywhere  -Slightly worse renal function today -Likely multifactorial including ATN in the setting of the above as well as diuresis  -Hold IV Lasix -Monitor per IM  5. Anemia: -Uncertain etiology  -HGB of > 12 in 11/2018 -Maintain HGB > 8.0 -Consider iron infusion  -Recommend further work up per IM  6. Thrombocytosis: -? Inflammatory -Improving -Per IM  7. Hypoalbuminemia: -Contributing to the above -Per IM  8. Hyperkalemia: -Improving -Monitor with underlying renal disease   9. Elevated Hs-Tn: -Not consistent with ACS -No plans for inpatient ischemic evaluation at this time -Follow up as an outpatient    For questions or updates, please contact [facility] Please consult [email or web address] for contact info under Cardiology/STEMI.   Signed, Christell Faith, PA-C [place] Pager: [phone] 06/22/2019, 9:12 AM

## 2019-06-22 NOTE — Progress Notes (Signed)
ANTICOAGULATION CONSULT NOTE - Initial Consult  Pharmacy Consult for Apixaban Indication: atrial fibrillation  No Known Allergies  Patient Measurements: Height: 5\' 9"  (175.3 cm) Weight: 166 lb 4.8 oz (75.4 kg) IBW/kg (Calculated) : 70.7 Heparin Dosing Weight:    Vital Signs: Temp: 97.9 F (36.6 C) (10/02 1049) Temp Source: Oral (10/02 1049) BP: 163/85 (10/02 1049) Pulse Rate: 70 (10/02 1049)  Labs: Recent Labs    06/20/19 0407 06/21/19 0552 06/22/19 0604  HGB  --   --  8.0*  HCT  --   --  25.6*  PLT  --   --  496*  LABPROT  --   --  17.5*  INR  --   --  1.5*  CREATININE 1.48* 1.65* 1.73*    Estimated Creatinine Clearance: 47.7 mL/min (A) (by C-G formula based on SCr of 1.73 mg/dL (H)).   Medical History: Past Medical History:  Diagnosis Date  . Atrial fibrillation ([place])   . Diabetes mellitus without complication ([place])   . Hypertension     Assessment: Patient is a 58yo male with history of afib. Pharmacy consulted for Apixaban dosing.   Plan:  Will order Apixaban 5mg  PO bid. Follow CBC every 3 days per protocol.  Paulina Fusi, PharmD, BCPS 06/22/2019 6:12 PM

## 2019-06-22 NOTE — Progress Notes (Signed)
[facility] [place] NAME: Nathan [name]    MR#:  [id number]  DATE OF BIRTH:  February 28, 1961  SUBJECTIVE:  CHIEF COMPLAINT:   Chief Complaint  Patient presents with  . Shortness of Breath  . Leg Swelling    bilateral   Came with shortness of breath and edema.  Patient discharged after and infection and procedure for IND.  Had MRSA bacteremia. No new fever.  X-ray reported pneumonia.  On antibiotics. Later ID Dr. suggested this is not look like pneumonia and likely fluid collection secondary to CHF so antibiotics were changed back to linezolid what he was for his abscess on hand. He overall feels better and does not have much swelling now on legs. CT scan showed loculated pleural effusion and taken to CT-guided pigtail catheter placement which is done.  REVIEW OF SYSTEMS:   CONSTITUTIONAL: No fever, fatigue or weakness.  EYES: No blurred or double vision.  EARS, NOSE, AND THROAT: No tinnitus or ear pain.  RESPIRATORY: Reporting cough, shortness of breath, denies wheezing or hemoptysis.  CARDIOVASCULAR: No chest pain, orthopnea, edema.  GASTROINTESTINAL: No nausea, vomiting, diarrhea or abdominal pain.  GENITOURINARY: No dysuria, hematuria.  ENDOCRINE: No polyuria, nocturia,  HEMATOLOGY: No anemia, easy bruising or bleeding SKIN: No rash or lesion.  Patient admits lower extremity swelling MUSCULOSKELETAL: No joint pain or arthritis.   NEUROLOGIC: No tingling, numbness, weakness.  PSYCHIATRY: No anxiety or depression.  ROS  DRUG ALLERGIES:  No Known Allergies  VITALS:  Blood pressure (!) 163/85, pulse 70, temperature 97.9 F (36.6 C), temperature source Oral, resp. rate 19, height 5\' 9"  (1.753 m), weight 75.4 kg, SpO2 95 %.  PHYSICAL EXAMINATION:   GENERAL:  58 y.o.-year-old patient lying in the bed with no acute distress.  EYES: Pupils equal, round, reactive to light and accommodation. No scleral icterus. Extraocular muscles  intact.  HEENT: Head atraumatic, normocephalic. Oropharynx and nasopharynx clear.  NECK:  Supple, no jugular venous distention. No thyroid enlargement, no tenderness.  LUNGS: Moderate breath sounds bilaterally, decreased on left lower lobe, no wheezing, rales,rhonchi.  Positive crepitation. No use of accessory muscles of respiration.  Left-sided chest catheter present with drainage. CARDIOVASCULAR: S1, S2 normal. No murmurs, rubs, or gallops.  ABDOMEN: Soft, nontender, nondistended. Bowel sounds present.  EXTREMITIES: 2+ pedal edema, no cyanosis, or clubbing.  Right hand is in dressing after surgery last week. NEUROLOGIC: Cranial nerves II through XII are intact. Muscle strength 5/5 in all extremities. Sensation intact. Gait not checked.  PSYCHIATRIC: The patient is alert and oriented x 3.  SKIN: No obvious rash, lesion, or ulcer.    Physical Exam LABORATORY PANEL:   CBC Recent Labs  Lab 06/22/19 0604  WBC 6.6  HGB 8.0*  HCT 25.6*  PLT 496*   ------------------------------------------------------------------------------------------------------------------  Chemistries  Recent Labs  Lab 06/20/19 0407  06/22/19 0604  NA 141   < > 143  K 5.4*   < > 4.8  CL 116*   < > 116*  CO2 20*   < > 21*  GLUCOSE 138*   < > 128*  BUN 51*   < > 60*  CREATININE 1.48*   < > 1.73*  CALCIUM 8.0*   < > 7.9*  AST 21  --   --   ALT 25  --   --   ALKPHOS 287*  --   --   BILITOT 0.6  --   --    < > = values  in this interval not displayed.   ------------------------------------------------------------------------------------------------------------------  Cardiac Enzymes No results for input(s): TROPONINI in the last 168 hours. ------------------------------------------------------------------------------------------------------------------  RADIOLOGY:  Dg Chest 2 View  Result Date: 06/21/2019 CLINICAL DATA:  Pleural effusion EXAM: CHEST - 2 VIEW COMPARISON:  06/19/2019 FINDINGS: No  significant change in moderate left, small right pleural effusions with associated atelectasis or consolidation of the left lung. There is some degree of underlying diffuse heterogeneous and interstitial bilateral airspace opacity suggesting multifocal infection or edema. The heart and mediastinum are unremarkable. IMPRESSION: No significant change in moderate left, small right pleural effusions with associated atelectasis or consolidation of the left lung. There is some degree of underlying diffuse heterogeneous and interstitial bilateral airspace opacity suggesting multifocal infection or edema. CT may be helpful to further assess. Electronically Signed   By: Eddie Candle M.D.   On: 06/21/2019 09:57   Ct Chest Wo Contrast  Result Date: 06/21/2019 CLINICAL DATA:  Chest x-ray today showed small right pleural effusions with associated atelectasis or consolidation of the left lung. There is some degree of underlying diffuse heterogeneous and interstitial bilateral airspace opacity suggesting multifocal infection or edema. EXAM: CT CHEST WITHOUT CONTRAST TECHNIQUE: Multidetector CT imaging of the chest was performed following the standard protocol without IV contrast. COMPARISON:  Chest x-ray on 06/21/2019 FINDINGS: Cardiovascular: Heart is mildly enlarged. There is coronary artery calcification. Small pericardial effusion measures less than 5 millimeters. There is atherosclerotic calcification of the thoracic aorta not associated with aneurysm. Mediastinum/Nodes: The visualized portion of the thyroid gland has a normal appearance. Multiple enlarged mediastinal and hilar lymph nodes are present. Prevascular lymph node is 1.4 centimeters on image 71/2. Precarinal lymph node is 1.2 centimeters on image 58/2. Esophagus is normal in appearance. Lungs/Pleura: Bilateral pleural effusions. RIGHT pleural effusion appears simple. Effusion in the LEFT lung appears loculated and somewhat irregular. The findings raise a  question possible empyema. Lack of intravenous contrast limits full evaluation. There is consolidation in the LEFT LOWER lobe and to a lesser degree in the RIGHT LOWER lobe. There are patchy ground-glass opacities within the RIGHT UPPER lobe, somewhat confluent in areas and favoring infectious process. There is subpleural septal thickening primarily within the UPPER lobes. Upper Abdomen: Small amount of perihepatic fluid. Gallbladder is present. Musculoskeletal: No chest wall mass or suspicious bone lesions identified. IMPRESSION: 1. Cardiomegaly and coronary artery disease. 2. Small pericardial effusion. 3. Bilateral pleural effusions, left greater than RIGHT. Possible LEFT empyema. 4. Bilateral lower lobe consolidation and ground-glass opacities within the RIGHT UPPER lobe, consistent with infectious process. 5. Mediastinal and hilar adenopathy, likely reactive. 6. Mild pulmonary edema. 7. Small amount of perihepatic fluid. 8. Aortic Atherosclerosis (ICD10-I70.0). Electronically Signed   By: Nolon Nations M.D.   On: 06/21/2019 14:21   Ct Image Guided Fluid Drain By Catheter  Result Date: 06/22/2019 INDICATION: Loculated left effusion, concern for empyema EXAM: CT guided 12 French left chest tube insertion MEDICATIONS: The patient is currently admitted to the hospital and receiving intravenous antibiotics. The antibiotics were administered within an appropriate time frame prior to the initiation of the procedure. ANESTHESIA/SEDATION: Fentanyl 50 mcg IV; Versed 1.0 mg IV Moderate Sedation Time:  16 minutes The patient was continuously monitored during the procedure by the interventional radiology nurse under my direct supervision. COMPLICATIONS: None immediate. PROCEDURE: Informed written consent was obtained from the patient after a thorough discussion of the procedural risks, benefits and alternatives. All questions were addressed. Maximal Sterile Barrier Technique was utilized including caps, mask,  sterile  gowns, sterile gloves, sterile drape, hand hygiene and skin antiseptic. A timeout was performed prior to the initiation of the procedure. Previous imaging reviewed. Patient positioned right side down decubitus. Noncontrast localization CT performed. The loculated left effusion was localized through a mid axillary lower intercostal space. Overlying skin marked. Under sterile conditions and local anesthesia, an 18 gauge 10 cm access needle was advanced into the effusion. Needle position confirmed with CT. Syringe aspiration yielded serous fluid. Sample sent for culture. Guidewire inserted followed by tract dilatation to insert a 12 [place] drain. Drain catheter position confirmed with CT. Catheter secured with Prolene suture and connected to external pleura vac. Sterile dressing applied. No immediate complication. Patient tolerated the procedure well. IMPRESSION: Successful CT-guided 12 French left chest tube insertion. Electronically Signed   By: Jerilynn Mages.  Shick M.D.   On: 06/22/2019 10:48    ASSESSMENT AND PLAN:   Principal Problem:   Empyema ([place]) Active Problems:   Sepsis ([place])  #Healthcare associated pneumonia-suspected admission but question able now.  IV Rocephin and vancomycin were given in the ED will continue  on Zyvox  Sputum culture and sensitivity ID consult placed to Dr. Levester Fresh, changed back to Zyvox COVID negative. Procalcitonin is not high.  White blood cell count is not high.  Patient does not have fever. CT scan chest reported collection on left could be empyema with right upper lobe possible infiltrate.-  Called IR and pulmonology consult. S/p left chest catheter placement by IR 06/22/19.  # Pleural effusion   S/p IR guided drainage tube placement.  Labs and cultures are sent. IR or general surgery to manage the tube.  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 Monitor renal function closely Daily intake and output Patient was seen by [facility] cardiology   #Hyperkalemia given Lokelma and Lasix Check a.m. labs- came down.  #Recent history of right hand cellulitis with MRSA bacteremia and MRSA abscess of the right thumb TEE was negative during the previous admission Patient was seen by infectious disease Dr. Levester Fresh and patient was discharged home on linezolid for a total of 14 days on the day of discharge Called orthopedic consult as patient was supposed to go for suture removal. Ortho suggested good healing and advised to take the sutures off.  ID suggested to continue and finish the course of oral linezolid.  #Chronic atrial fibrillation rate controlled Continue baby aspirin patient stopped taking Eliquis will resume the same Case management consult placed regarding medication assistance with Eliquis patient states he cannot afford it. Currently Eliquis held because of possible requirement of thoracentesis.  #Insulin requiring diabetes mellitus Sliding scale insulin and continue Levemir  DVT prophylaxis with Lovenox subcu   All the records are reviewed and case discussed with Care Management/Social Workerr. Management plans discussed with the patient, family and they are in agreement.  CODE STATUS: Full  TOTAL TIME TAKING CARE OF THIS PATIENT: 35 minutes.  Discussed with ID and pulmonary physician and explained the patient about findings on CT chest.  POSSIBLE D/C IN 1-2 DAYS, DEPENDING ON CLINICAL CONDITION.   Vaughan Basta M.D on 06/22/2019   Between 7am to 6pm - Pager - [phone]  After 6pm go to [email or web address] - password [email or web address] [facility]  Office  [phone]  CC: Primary care physician; Patient, No Pcp  Per  Note: This dictation was prepared with Dragon dictation along with smaller phrase technology. Any transcriptional  errors that result from this process are unintentional.

## 2019-06-22 NOTE — Progress Notes (Signed)
[facility] SURGICAL PROGRESS NOTE (cpt [id number])  Hospital Day(s): 3.   Interval History: Patient seen and examined, no acute events or new complaints overnight. Patient had pigtail catheter placed in left chest this morning with IR. He reports that he is doing okay this morning. No significant reports of SOB. Denied any fever, chills, CP, nausea, or emesis. No leukocytosis. Renal function remains elevated but stable. U/O ~425 ccs. Fluid studies pending from chest tube placement. No other acute concerns this morning.   Review of Systems:  Constitutional: denies fever, chills  HEENT: denies cough or congestion  Respiratory: + shortness of breath (mild, improved) Cardiovascular: denies chest pain or palpitations  Gastrointestinal: denies abdominal pain, N/V, or diarrhea/and bowel function as per interval history Genitourinary: denies burning with urination or urinary frequency Musculoskeletal: + peripheral edema  Vital signs in last 24 hours: [min-max] current  Temp:  [98 F (36.7 C)-98.5 F (36.9 C)] 98 F (36.7 C) (10/02 0827) Pulse Rate:  [72-78] 73 (10/02 0827) Resp:  [16-19] 18 (10/02 0827) BP: (147-175)/(80-92) 160/85 (10/02 0827) SpO2:  [95 %-97 %] 95 % (10/02 0827) Weight:  [75.4 kg] 75.4 kg (10/02 0510)     Height: 5\' 9"  (175.3 cm) Weight: 75.4 kg BMI (Calculated): 24.55   Intake/Output last 2 shifts:  10/01 0701 - 10/02 0700 In: -  Out: 425 [Urine:425]   Physical Exam:  Constitutional: alert, cooperative and no distress  HENT: normocephalic without obvious abnormality  Eyes: PERRL, EOM's grossly intact and symmetric  Respiratory: breathing non-labored at rest, rhonchi at the bases on the left, diminished  Chest: Pigtail in left lower lateral chest wall, there is serous drainage in tubing and plera-vac. No air leak.  Cardiovascular: regular rate and sinus rhythm  Musculoskeletal: +2 pitting edema bilateral lower extremities, compression stockings in place,  motor and sensation grossly intact, NT    Labs:  CBC Latest Ref Rng & Units 06/22/2019 06/19/2019 06/16/2019  WBC 4.0 - 10.5 K/uL 6.6 12.2(H) 16.4(H)  Hemoglobin 13.0 - 17.0 g/dL 8.0(L) 8.7(L) 8.2(L)  Hematocrit 39.0 - 52.0 % 25.6(L) 27.2(L) 25.6(L)  Platelets 150 - 400 K/uL 496(H) 878(H) 737(H)   CMP Latest Ref Rng & Units 06/22/2019 06/21/2019 06/20/2019  Glucose 70 - 99 mg/dL 128(H) 91 138(H)  BUN 6 - 20 mg/dL 60(H) 58(H) 51(H)  Creatinine 0.61 - 1.24 mg/dL 1.73(H) 1.65(H) 1.48(H)  Sodium 135 - 145 mmol/L 143 141 141  Potassium 3.5 - 5.1 mmol/L 4.8 5.2(H) 5.4(H)  Chloride 98 - 111 mmol/L 116(H) 113(H) 116(H)  CO2 22 - 32 mmol/L 21(L) 20(L) 20(L)  Calcium 8.9 - 10.3 mg/dL 7.9(L) 8.1(L) 8.0(L)  Total Protein 6.5 - 8.1 g/dL - - 5.6(L)  Total Bilirubin 0.3 - 1.2 mg/dL - - 0.6  Alkaline Phos 38 - 126 U/L - - 287(H)  AST 15 - 41 U/L - - 21  ALT 0 - 44 U/L - - 25     Imaging studies: No new pertinent imaging studies   Assessment/Plan: (ICD-10's: J90.0) 58 y.o. male with left complicated pleural effusion with serous fluid which appears more attributable to possible CHF vs fluid overload rather than infectious empyema, complicated by ARF and recent I&D for MRSA + thenar eminence abscess.    - Continue chest tube to suction (-20cm); monitor output  - Will repeat CXR in the AM to reassess pleural effusion  - Do not suspect empyema, if no improvement over next 24-48 hours we can consider intrapleural tPA for loculated effusion   -  Pleural fluid studies pending; will follow these   - Pulmonary toilet   - further management per primary and consulting services; appreciate their assistance   All of the above findings and recommendations were discussed with the patient, and the medical team, and all of patient's questions were answered to his expressed satisfaction.  -- Edison Simon, PA-C [facility] 06/22/2019, 8:30 AM [phone] M-F: 7am - 4pm

## 2019-06-23 ENCOUNTER — Inpatient Hospital Stay: Payer: Self-pay

## 2019-06-23 LAB — URINALYSIS, ROUTINE W REFLEX MICROSCOPIC
Bilirubin Urine: NEGATIVE
Glucose, UA: NEGATIVE mg/dL
Ketones, ur: NEGATIVE mg/dL
Leukocytes,Ua: NEGATIVE
Nitrite: NEGATIVE
Protein, ur: 100 mg/dL — AB
Specific Gravity, Urine: 1.019 (ref 1.005–1.030)
pH: 5 (ref 5.0–8.0)

## 2019-06-23 LAB — BASIC METABOLIC PANEL
Anion gap: 7 (ref 5–15)
BUN: 61 mg/dL — ABNORMAL HIGH (ref 6–20)
CO2: 22 mmol/L (ref 22–32)
Calcium: 7.8 mg/dL — ABNORMAL LOW (ref 8.9–10.3)
Chloride: 115 mmol/L — ABNORMAL HIGH (ref 98–111)
Creatinine, Ser: 1.65 mg/dL — ABNORMAL HIGH (ref 0.61–1.24)
GFR calc Af Amer: 53 mL/min — ABNORMAL LOW (ref >60)
GFR calc non Af Amer: 46 mL/min — ABNORMAL LOW (ref >60)
Glucose, Bld: 101 mg/dL — ABNORMAL HIGH (ref 70–99)
Potassium: 4.8 mmol/L (ref 3.5–5.1)
Sodium: 144 mmol/L (ref 135–145)

## 2019-06-23 LAB — GLUCOSE, CAPILLARY
Glucose-Capillary: 80 mg/dL (ref 70–99)
Glucose-Capillary: 146 mg/dL — ABNORMAL HIGH (ref 70–99)
Glucose-Capillary: 154 mg/dL — ABNORMAL HIGH (ref 70–99)
Glucose-Capillary: 165 mg/dL — ABNORMAL HIGH (ref 70–99)

## 2019-06-23 NOTE — Progress Notes (Signed)
CC: Empyema Subjective: No new complaints, dyspnea w some improvement. CXR and CT scan personally reviewed, complex Left effusion w loculations. CT in place small PTX, persistent opacity at the base LDH 119   Objective: Vital signs in last 24 hours: Temp:  [97.9 F (36.6 C)-98.8 F (37.1 C)] 97.9 F (36.6 C) (10/03 0817) Pulse Rate:  [71-73] 71 (10/03 0817) Resp:  [16-18] 18 (10/03 0817) BP: (152-171)/(80-83) 171/80 (10/03 0817) SpO2:  [94 %-95 %] 95 % (10/03 0817) Weight:  [75 kg] 75 kg (10/03 0500) Last BM Date: 06/22/19  Intake/Output from previous day: 10/02 0701 - 10/03 0700 In: 63 [P.O.:60; I.V.:3] Out: 1000 [Urine:550; Chest Tube:450] Intake/Output this shift: Total I/O In: 240 [P.O.:240] Out: -   Physical exam:  NAd, alert Chest: Left CT in place no air leak, serous output. Decrease bs on the left side Abd: soft , nt, no peritonitis Ext: no edema and well perfused  Lab Results: CBC  Recent Labs    06/22/19 0604  WBC 6.6  HGB 8.0*  HCT 25.6*  PLT 496*   BMET Recent Labs    06/22/19 0604 06/23/19 0527  NA 143 144  K 4.8 4.8  CL 116* 115*  CO2 21* 22  GLUCOSE 128* 101*  BUN 60* 61*  CREATININE 1.73* 1.65*  CALCIUM 7.9* 7.8*   PT/INR Recent Labs    06/22/19 0604  LABPROT 17.5*  INR 1.5*   ABG No results for input(s): PHART, HCO3 in the last 72 hours.  Invalid input(s): PCO2, PO2  Studies/Results: Dg Chest 2 View  Result Date: 06/23/2019 CLINICAL DATA:  Left-sided chest tube since yesterday. EXAM: CHEST - 2 VIEW COMPARISON:  06/21/2019 FINDINGS: Left basilar chest tube. Persistent partially loculated small left pleural effusion. Small right pleural effusion. Mild bilateral interstitial thickening likely chronic. Mild right basilar atelectasis. Trace left pneumothorax. Stable cardiomediastinal silhouette. No aggressive osseous lesion. IMPRESSION: 1. Left basilar chest tube with a trace pneumothorax. Partially loculated small left pleural  effusion. Electronically Signed   By: Kathreen Devoid   On: 06/23/2019 09:29   Ct Chest Wo Contrast  Result Date: 06/21/2019 CLINICAL DATA:  Chest x-ray today showed small right pleural effusions with associated atelectasis or consolidation of the left lung. There is some degree of underlying diffuse heterogeneous and interstitial bilateral airspace opacity suggesting multifocal infection or edema. EXAM: CT CHEST WITHOUT CONTRAST TECHNIQUE: Multidetector CT imaging of the chest was performed following the standard protocol without IV contrast. COMPARISON:  Chest x-ray on 06/21/2019 FINDINGS: Cardiovascular: Heart is mildly enlarged. There is coronary artery calcification. Small pericardial effusion measures less than 5 millimeters. There is atherosclerotic calcification of the thoracic aorta not associated with aneurysm. Mediastinum/Nodes: The visualized portion of the thyroid gland has a normal appearance. Multiple enlarged mediastinal and hilar lymph nodes are present. Prevascular lymph node is 1.4 centimeters on image 71/2. Precarinal lymph node is 1.2 centimeters on image 58/2. Esophagus is normal in appearance. Lungs/Pleura: Bilateral pleural effusions. RIGHT pleural effusion appears simple. Effusion in the LEFT lung appears loculated and somewhat irregular. The findings raise a question possible empyema. Lack of intravenous contrast limits full evaluation. There is consolidation in the LEFT LOWER lobe and to a lesser degree in the RIGHT LOWER lobe. There are patchy ground-glass opacities within the RIGHT UPPER lobe, somewhat confluent in areas and favoring infectious process. There is subpleural septal thickening primarily within the UPPER lobes. Upper Abdomen: Small amount of perihepatic fluid. Gallbladder is present. Musculoskeletal: No chest wall mass or  suspicious bone lesions identified. IMPRESSION: 1. Cardiomegaly and coronary artery disease. 2. Small pericardial effusion. 3. Bilateral pleural  effusions, left greater than RIGHT. Possible LEFT empyema. 4. Bilateral lower lobe consolidation and ground-glass opacities within the RIGHT UPPER lobe, consistent with infectious process. 5. Mediastinal and hilar adenopathy, likely reactive. 6. Mild pulmonary edema. 7. Small amount of perihepatic fluid. 8. Aortic Atherosclerosis (ICD10-I70.0). Electronically Signed   By: Nolon Nations M.D.   On: 06/21/2019 14:21   Ct Image Guided Fluid Drain By Catheter  Result Date: 06/22/2019 INDICATION: Loculated left effusion, concern for empyema EXAM: CT guided 12 French left chest tube insertion MEDICATIONS: The patient is currently admitted to the hospital and receiving intravenous antibiotics. The antibiotics were administered within an appropriate time frame prior to the initiation of the procedure. ANESTHESIA/SEDATION: Fentanyl 50 mcg IV; Versed 1.0 mg IV Moderate Sedation Time:  16 minutes The patient was continuously monitored during the procedure by the interventional radiology nurse under my direct supervision. COMPLICATIONS: None immediate. PROCEDURE: Informed written consent was obtained from the patient after a thorough discussion of the procedural risks, benefits and alternatives. All questions were addressed. Maximal Sterile Barrier Technique was utilized including caps, mask, sterile gowns, sterile gloves, sterile drape, hand hygiene and skin antiseptic. A timeout was performed prior to the initiation of the procedure. Previous imaging reviewed. Patient positioned right side down decubitus. Noncontrast localization CT performed. The loculated left effusion was localized through a mid axillary lower intercostal space. Overlying skin marked. Under sterile conditions and local anesthesia, an 18 gauge 10 cm access needle was advanced into the effusion. Needle position confirmed with CT. Syringe aspiration yielded serous fluid. Sample sent for culture. Guidewire inserted followed by tract dilatation to insert  a 12 [place] drain. Drain catheter position confirmed with CT. Catheter secured with Prolene suture and connected to external pleura vac. Sterile dressing applied. No immediate complication. Patient tolerated the procedure well. IMPRESSION: Successful CT-guided 12 French left chest tube insertion. Electronically Signed   By: Jerilynn Mages.  Shick M.D.   On: 06/22/2019 10:48    Anti-infectives: Anti-infectives (From admission, onward)   Start     Dose/Rate Route Frequency Ordered Stop   06/20/19 2000  linezolid (ZYVOX) tablet 600 mg     600 mg Oral Every 12 hours 06/20/19 1551     06/20/19 0700  linezolid (ZYVOX) IVPB 600 mg  Status:  Discontinued     600 mg 300 mL/hr over 60 Minutes Intravenous Every 12 hours 06/19/19 1947 06/20/19 1551   06/19/19 1900  vancomycin (VANCOCIN) IVPB 1000 mg/200 mL premix     1,000 mg 200 mL/hr over 60 Minutes Intravenous  Once 06/19/19 1847 06/19/19 2024   06/19/19 1800  azithromycin (ZITHROMAX) 500 mg in sodium chloride 0.9 % 250 mL IVPB  Status:  Discontinued     500 mg 250 mL/hr over 60 Minutes Intravenous  Once 06/19/19 1748 06/19/19 1846   06/19/19 1800  cefTRIAXone (ROCEPHIN) 1 g in sodium chloride 0.9 % 100 mL IVPB     1 g 200 mL/hr over 30 Minutes Intravenous  Once 06/19/19 1748 06/19/19 1943      Assessment/Plan: 58 w complex loculated Left pleural effusion/.  No real change on CXr after placement of pigtail. May benefit from VATS . Dr. Genevive Bi is out of town for the next 10 days and he may need to be transfer to tertiary facility for potential VATS.   Keep CT to suction   Caroleen Hamman, MD, FACS  06/23/2019

## 2019-06-23 NOTE — Plan of Care (Signed)
  Problem: Clinical Measurements: Goal: Will remain free from infection Outcome: Not Met (add Reason) Goal: Diagnostic test results will improve Outcome: Progressing Goal: Respiratory complications will improve Outcome: Progressing

## 2019-06-23 NOTE — Progress Notes (Signed)
[facility] [place] NAME: Suyog Penix    MR#:  [id number]  DATE OF BIRTH:  [birth year]  SUBJECTIVE:  CHIEF COMPLAINT:   Chief Complaint  Patient presents with  . Shortness of Breath  . Leg Swelling    bilateral   Came with shortness of breath and edema.  Patient discharged after and infection and procedure for IND.  Had MRSA bacteremia. No new fever.  X-ray reported pneumonia.  On antibiotics. Later ID Dr. suggested this is not look like pneumonia and likely fluid collection secondary to CHF so antibiotics were changed back to linezolid what he was for his abscess on hand. He overall feels better and does not have much swelling now on legs. CT scan showed loculated pleural effusion and taken to CT-guided pigtail catheter placement which is done. The patient has no complaints.  Still has a lot of drainage from chest tube. REVIEW OF SYSTEMS:   CONSTITUTIONAL: No fever, fatigue or weakness.  EYES: No blurred or double vision.  EARS, NOSE, AND THROAT: No tinnitus or ear pain.  RESPIRATORY: Reporting cough, shortness of breath, denies wheezing or hemoptysis.  CARDIOVASCULAR: No chest pain, orthopnea, edema.  GASTROINTESTINAL: No nausea, vomiting, diarrhea or abdominal pain.  GENITOURINARY: No dysuria, hematuria.  ENDOCRINE: No polyuria, nocturia,  HEMATOLOGY: No anemia, easy bruising or bleeding SKIN: No rash or lesion.  Patient admits lower extremity swelling MUSCULOSKELETAL: No joint pain or arthritis.   NEUROLOGIC: No tingling, numbness, weakness.  PSYCHIATRY: No anxiety or depression.  ROS  DRUG ALLERGIES:  No Known Allergies  VITALS:  Blood pressure (!) 171/80, pulse 71, temperature 97.9 F (36.6 C), temperature source Oral, resp. rate 18, height 5\' 9"  (1.753 m), weight 75 kg, SpO2 95 %.  PHYSICAL EXAMINATION:   GENERAL:  58 y.o.-year-old patient lying in the bed with no acute distress.  EYES: Pupils equal, round, reactive  to light and accommodation. No scleral icterus. Extraocular muscles intact.  HEENT: Head atraumatic, normocephalic. NECK:  Supple, no jugular venous distention. No thyroid enlargement, no tenderness.  LUNGS: Moderate breath sounds bilaterally, decreased on left lower lobe, no wheezing, rales,rhonchi.  Positive crepitation. No use of accessory muscles of respiration.  Left-sided chest catheter present with drainage. CARDIOVASCULAR: S1, S2 normal. No murmurs, rubs, or gallops.  ABDOMEN: Soft, nontender, nondistended. Bowel sounds present.  EXTREMITIES: 2+ pedal edema, no cyanosis, or clubbing.  Right hand is in dressing after surgery last week. NEUROLOGIC: Cranial nerves II through XII are intact. Muscle strength 5/5 in all extremities. Sensation intact. Gait not checked.  PSYCHIATRIC: The patient is alert and oriented x 3.  SKIN: No obvious rash, lesion, or ulcer.    Physical Exam LABORATORY PANEL:   CBC Recent Labs  Lab 06/22/19 0604  WBC 6.6  HGB 8.0*  HCT 25.6*  PLT 496*   ------------------------------------------------------------------------------------------------------------------  Chemistries  Recent Labs  Lab 06/20/19 0407  06/23/19 0527  NA 141   < > 144  K 5.4*   < > 4.8  CL 116*   < > 115*  CO2 20*   < > 22  GLUCOSE 138*   < > 101*  BUN 51*   < > 61*  CREATININE 1.48*   < > 1.65*  CALCIUM 8.0*   < > 7.8*  AST 21  --   --   ALT 25  --   --   ALKPHOS 287*  --   --   BILITOT 0.6  --   --    < > =  values in this interval not displayed.   ------------------------------------------------------------------------------------------------------------------  Cardiac Enzymes No results for input(s): TROPONINI in the last 168 hours. ------------------------------------------------------------------------------------------------------------------  RADIOLOGY:  Dg Chest 2 View  Result Date: 06/23/2019 CLINICAL DATA:  Left-sided chest tube since yesterday. EXAM: CHEST  - 2 VIEW COMPARISON:  06/21/2019 FINDINGS: Left basilar chest tube. Persistent partially loculated small left pleural effusion. Small right pleural effusion. Mild bilateral interstitial thickening likely chronic. Mild right basilar atelectasis. Trace left pneumothorax. Stable cardiomediastinal silhouette. No aggressive osseous lesion. IMPRESSION: 1. Left basilar chest tube with a trace pneumothorax. Partially loculated small left pleural effusion. Electronically Signed   By: Kathreen Devoid   On: 06/23/2019 09:29   Ct Image Guided Fluid Drain By Catheter  Result Date: 06/22/2019 INDICATION: Loculated left effusion, concern for empyema EXAM: CT guided 12 French left chest tube insertion MEDICATIONS: The patient is currently admitted to the hospital and receiving intravenous antibiotics. The antibiotics were administered within an appropriate time frame prior to the initiation of the procedure. ANESTHESIA/SEDATION: Fentanyl 50 mcg IV; Versed 1.0 mg IV Moderate Sedation Time:  16 minutes The patient was continuously monitored during the procedure by the interventional radiology nurse under my direct supervision. COMPLICATIONS: None immediate. PROCEDURE: Informed written consent was obtained from the patient after a thorough discussion of the procedural risks, benefits and alternatives. All questions were addressed. Maximal Sterile Barrier Technique was utilized including caps, mask, sterile gowns, sterile gloves, sterile drape, hand hygiene and skin antiseptic. A timeout was performed prior to the initiation of the procedure. Previous imaging reviewed. Patient positioned right side down decubitus. Noncontrast localization CT performed. The loculated left effusion was localized through a mid axillary lower intercostal space. Overlying skin marked. Under sterile conditions and local anesthesia, an 18 gauge 10 cm access needle was advanced into the effusion. Needle position confirmed with CT. Syringe aspiration yielded  serous fluid. Sample sent for culture. Guidewire inserted followed by tract dilatation to insert a 12 [place] drain. Drain catheter position confirmed with CT. Catheter secured with Prolene suture and connected to external pleura vac. Sterile dressing applied. No immediate complication. Patient tolerated the procedure well. IMPRESSION: Successful CT-guided 12 French left chest tube insertion. Electronically Signed   By: Jerilynn Mages.  Shick M.D.   On: 06/22/2019 10:48    ASSESSMENT AND PLAN:   Principal Problem:   Empyema ([place]) Active Problems:   Sepsis ([place])  #Healthcare associated pneumonia-suspected admission but question able now.  IV Rocephin and vancomycin were given in the ED. Per Dr. Levester Fresh, changed back to Zyvox COVID negative. Procalcitonin is not high.  White blood cell count is not high.  Patient does not have fever. CT scan chest reported collection on left could be empyema with right upper lobe possible infiltrate. S/p left chest catheter placement by IR 06/22/19.  # Pleural effusion/empyema   S/p IR guided drainage tube placement.  Labs and cultures are sent. Per Dr. Dahlia Byes, who discussed with Dr. Genevive Bi,  No real change on CXr after placement of pigtail. Dr. Genevive Bi is out of town for the next 10 days and he may need to be transfer to [facility] for possible VATS.  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 Renal function is better.  #Hyperkalemia given Lokelma and Lasix Check a.m. labs- came down.  #Recent history of right hand cellulitis with MRSA bacteremia  and MRSA abscess of the right thumb TEE was negative during the previous admission Patient was seen by infectious disease Dr. Levester Fresh and patient was discharged home on linezolid for a total of 14 days on the day of discharge Called orthopedic consult as  patient was supposed to go for suture removal. Ortho suggested good healing and advised to take the sutures off.  ID suggested to continue and finish the course of oral linezolid.  #Chronic atrial fibrillation rate controlled Continue baby aspirin and resume Eliquis.  Continue Lopressor. Case management consult placed regarding medication assistance with Eliquis patient states he cannot afford it.  #Insulin requiring diabetes mellitus Sliding scale insulin and continue Levemir  CKD stage III.  Stable.  Anemia of chronic disease. Needs iron infusion, goal hemoglobin 10.0 per cardiologist.  Hematology consult.  DVT prophylaxis with Lovenox subcu  I discussed with Dr. Dahlia Byes, who will discuss with thoracic surgeon in [facility]. I discussed with on-call triage hospitalist for potential transfer. All the records are reviewed and case discussed with Care Management/Social Workerr. Management plans discussed with the patient, family and they are in agreement.  CODE STATUS: Full  TOTAL TIME TAKING CARE OF THIS PATIENT: 35 minutes.  Discussed with ID and pulmonary physician and explained the patient about findings on CT chest.  POSSIBLE D/C IN ? DAYS, DEPENDING ON CLINICAL CONDITION.   Demetrios Loll M.D on 06/23/2019   Between 7am to 6pm - Pager - [phone]  After 6pm go to [email or web address] - password [email or web address] [facility]  Office  [phone]  CC: Primary care physician; Patient, No Pcp Per  Note: This dictation was prepared with Dragon dictation along with smaller phrase technology. Any transcriptional errors that result from this process are unintentional.

## 2019-06-23 NOTE — Progress Notes (Addendum)
Pt refused eliquis, states the dr does not want him to take it while he has the chest tube. He is compliant with compression stockings

## 2019-06-24 ENCOUNTER — Inpatient Hospital Stay: Payer: Self-pay

## 2019-06-24 ENCOUNTER — Inpatient Hospital Stay (HOSPITAL_COMMUNITY)
Admission: AD | Admit: 2019-06-24 | Discharge: 2019-06-30 | DRG: 163 | Disposition: A | Discharge status: Home / Self Care | Payer: Self-pay | Source: Other Acute Inpatient Hospital | Attending: Internal Medicine | Admitting: Internal Medicine

## 2019-06-24 DIAGNOSIS — I071 Rheumatic tricuspid insufficiency: Secondary | ICD-10-CM | POA: Diagnosis present

## 2019-06-24 DIAGNOSIS — Z833 Family history of diabetes mellitus: Secondary | ICD-10-CM

## 2019-06-24 DIAGNOSIS — Z79899 Other long term (current) drug therapy: Secondary | ICD-10-CM

## 2019-06-24 DIAGNOSIS — D631 Anemia in chronic kidney disease: Secondary | ICD-10-CM

## 2019-06-24 DIAGNOSIS — B9562 Methicillin resistant Staphylococcus aureus infection as the cause of diseases classified elsewhere: Secondary | ICD-10-CM | POA: Diagnosis present

## 2019-06-24 DIAGNOSIS — J869 Pyothorax without fistula: Principal | ICD-10-CM | POA: Diagnosis present

## 2019-06-24 DIAGNOSIS — E109 Type 1 diabetes mellitus without complications: Secondary | ICD-10-CM | POA: Diagnosis present

## 2019-06-24 DIAGNOSIS — Z20828 Contact with and (suspected) exposure to other viral communicable diseases: Secondary | ICD-10-CM | POA: Diagnosis present

## 2019-06-24 DIAGNOSIS — L03011 Cellulitis of right finger: Secondary | ICD-10-CM | POA: Diagnosis present

## 2019-06-24 DIAGNOSIS — L02511 Cutaneous abscess of right hand: Secondary | ICD-10-CM | POA: Diagnosis present

## 2019-06-24 DIAGNOSIS — I482 Chronic atrial fibrillation, unspecified: Secondary | ICD-10-CM | POA: Diagnosis present

## 2019-06-24 DIAGNOSIS — N5089 Other specified disorders of the male genital organs: Secondary | ICD-10-CM | POA: Diagnosis present

## 2019-06-24 DIAGNOSIS — E11649 Type 2 diabetes mellitus with hypoglycemia without coma: Secondary | ICD-10-CM | POA: Diagnosis not present

## 2019-06-24 DIAGNOSIS — N183 Chronic kidney disease, stage 3 unspecified: Secondary | ICD-10-CM | POA: Diagnosis present

## 2019-06-24 DIAGNOSIS — I480 Paroxysmal atrial fibrillation: Secondary | ICD-10-CM | POA: Diagnosis present

## 2019-06-24 DIAGNOSIS — J948 Other specified pleural conditions: Secondary | ICD-10-CM | POA: Diagnosis present

## 2019-06-24 DIAGNOSIS — E875 Hyperkalemia: Secondary | ICD-10-CM | POA: Diagnosis present

## 2019-06-24 DIAGNOSIS — I5042 Chronic combined systolic (congestive) and diastolic (congestive) heart failure: Secondary | ICD-10-CM

## 2019-06-24 DIAGNOSIS — I808 Phlebitis and thrombophlebitis of other sites: Secondary | ICD-10-CM | POA: Diagnosis not present

## 2019-06-24 DIAGNOSIS — I10 Essential (primary) hypertension: Secondary | ICD-10-CM | POA: Diagnosis present

## 2019-06-24 DIAGNOSIS — E1122 Type 2 diabetes mellitus with diabetic chronic kidney disease: Secondary | ICD-10-CM | POA: Diagnosis present

## 2019-06-24 DIAGNOSIS — I5022 Chronic systolic (congestive) heart failure: Secondary | ICD-10-CM

## 2019-06-24 DIAGNOSIS — Z01818 Encounter for other preprocedural examination: Secondary | ICD-10-CM

## 2019-06-24 DIAGNOSIS — I130 Hypertensive heart and chronic kidney disease with heart failure and stage 1 through stage 4 chronic kidney disease, or unspecified chronic kidney disease: Secondary | ICD-10-CM | POA: Diagnosis present

## 2019-06-24 DIAGNOSIS — I5043 Acute on chronic combined systolic (congestive) and diastolic (congestive) heart failure: Secondary | ICD-10-CM

## 2019-06-24 DIAGNOSIS — I5023 Acute on chronic systolic (congestive) heart failure: Secondary | ICD-10-CM

## 2019-06-24 DIAGNOSIS — Z9689 Presence of other specified functional implants: Secondary | ICD-10-CM

## 2019-06-24 DIAGNOSIS — J939 Pneumothorax, unspecified: Secondary | ICD-10-CM

## 2019-06-24 DIAGNOSIS — L03113 Cellulitis of right upper limb: Secondary | ICD-10-CM

## 2019-06-24 DIAGNOSIS — E1140 Type 2 diabetes mellitus with diabetic neuropathy, unspecified: Secondary | ICD-10-CM | POA: Diagnosis present

## 2019-06-24 DIAGNOSIS — N179 Acute kidney failure, unspecified: Secondary | ICD-10-CM

## 2019-06-24 DIAGNOSIS — D638 Anemia in other chronic diseases classified elsewhere: Secondary | ICD-10-CM | POA: Diagnosis present

## 2019-06-24 DIAGNOSIS — N1831 Chronic kidney disease, stage 3a: Secondary | ICD-10-CM

## 2019-06-24 DIAGNOSIS — Z7984 Long term (current) use of oral hypoglycemic drugs: Secondary | ICD-10-CM

## 2019-06-24 DIAGNOSIS — Z8249 Family history of ischemic heart disease and other diseases of the circulatory system: Secondary | ICD-10-CM

## 2019-06-24 DIAGNOSIS — Z7982 Long term (current) use of aspirin: Secondary | ICD-10-CM

## 2019-06-24 LAB — CBC WITH DIFFERENTIAL/PLATELET
Abs Immature Granulocytes: 0.03 10*3/uL (ref 0.00–0.07)
Basophils Absolute: 0.1 10*3/uL (ref 0.0–0.1)
Basophils Relative: 2 %
Eosinophils Absolute: 0.2 10*3/uL (ref 0.0–0.5)
Eosinophils Relative: 3 %
HCT: 28.3 % — ABNORMAL LOW (ref 39.0–52.0)
Hemoglobin: 9 g/dL — ABNORMAL LOW (ref 13.0–17.0)
Immature Granulocytes: 0 %
Lymphocytes Relative: 13 %
Lymphs Abs: 0.9 10*3/uL (ref 0.7–4.0)
MCH: 29.3 pg (ref 26.0–34.0)
MCHC: 31.8 g/dL (ref 30.0–36.0)
MCV: 92.2 fL (ref 80.0–100.0)
Monocytes Absolute: 0.4 10*3/uL (ref 0.1–1.0)
Monocytes Relative: 6 %
Neutro Abs: 5.1 10*3/uL (ref 1.7–7.7)
Neutrophils Relative %: 76 %
Platelets: 455 10*3/uL — ABNORMAL HIGH (ref 150–400)
RBC: 3.07 MIL/uL — ABNORMAL LOW (ref 4.22–5.81)
RDW: 13.2 % (ref 11.5–15.5)
WBC: 6.7 10*3/uL (ref 4.0–10.5)
nRBC: 0 % (ref 0.0–0.2)

## 2019-06-24 LAB — GLUCOSE, CAPILLARY
Glucose-Capillary: 132 mg/dL — ABNORMAL HIGH (ref 70–99)
Glucose-Capillary: 190 mg/dL — ABNORMAL HIGH (ref 70–99)
Glucose-Capillary: 173 mg/dL — ABNORMAL HIGH (ref 70–99)
Glucose-Capillary: 171 mg/dL — ABNORMAL HIGH (ref 70–99)

## 2019-06-24 LAB — RETIC PANEL
Immature Retic Fract: 6.3 % (ref 2.3–15.9)
RBC.: 3.07 MIL/uL — ABNORMAL LOW (ref 4.22–5.81)
Retic Count, Absolute: 50.7 10*3/uL (ref 19.0–186.0)
Retic Ct Pct: 1.7 % (ref 0.4–3.1)
Reticulocyte Hemoglobin: 35.6 pg (ref >27.9)

## 2019-06-24 MED ORDER — INSULIN ASPART 100 UNIT/ML ~~LOC~~ SOLN
0.0000 [IU] | Freq: Three times a day (TID) | SUBCUTANEOUS | Status: DC
  Administered 2019-06-24 – 2019-06-25 (×3): 3 [IU] via SUBCUTANEOUS
  Administered 2019-06-25: 13:00:00 2 [IU] via SUBCUTANEOUS

## 2019-06-24 MED ORDER — FUROSEMIDE 10 MG/ML IJ SOLN: 40.0000 mg | Freq: Every day | INTRAMUSCULAR | Status: DC

## 2019-06-24 MED ORDER — INSULIN ASPART 100 UNIT/ML ~~LOC~~ SOLN: 0.0000 [IU] | Freq: Every day | SUBCUTANEOUS | Status: DC

## 2019-06-24 MED ORDER — OXYCODONE-ACETAMINOPHEN 5-325 MG PO TABS: 1.0000 | ORAL_TABLET | Freq: Four times a day (QID) | ORAL | Status: DC | PRN

## 2019-06-24 MED ORDER — ACETAMINOPHEN 650 MG RE SUPP: 650.0000 mg | Freq: Four times a day (QID) | RECTAL | Status: DC | PRN

## 2019-06-24 MED ORDER — LINEZOLID 600 MG PO TABS
600.0000 mg | ORAL_TABLET | Freq: Two times a day (BID) | ORAL | Status: AC
  Administered 2019-06-24 – 2019-06-29 (×11): 600 mg via ORAL
  Filled 2019-06-24 (×11): qty 1

## 2019-06-24 MED ORDER — ACETAMINOPHEN 325 MG PO TABS: 650.0000 mg | ORAL_TABLET | Freq: Four times a day (QID) | ORAL | Status: DC | PRN

## 2019-06-24 MED ORDER — METOPROLOL SUCCINATE ER 100 MG PO TB24
100.0000 mg | ORAL_TABLET | Freq: Every day | ORAL | Status: DC
  Administered 2019-06-25 – 2019-06-26 (×2): 100 mg via ORAL
  Filled 2019-06-24 (×3): qty 1

## 2019-06-24 MED ORDER — AMLODIPINE BESYLATE 5 MG PO TABS: 5.0000 mg | ORAL_TABLET | Freq: Every day | ORAL | Status: DC

## 2019-06-24 MED ORDER — ONDANSETRON HCL 4 MG/2ML IJ SOLN: 4.0000 mg | Freq: Four times a day (QID) | INTRAMUSCULAR | Status: DC | PRN

## 2019-06-24 MED ORDER — AMLODIPINE BESYLATE 5 MG PO TABS
5.0000 mg | ORAL_TABLET | Freq: Every day | ORAL | Status: DC
  Administered 2019-06-25 – 2019-06-26 (×2): 5 mg via ORAL
  Filled 2019-06-24 (×2): qty 1

## 2019-06-24 MED ORDER — ONDANSETRON HCL 4 MG PO TABS: 4.0000 mg | ORAL_TABLET | Freq: Four times a day (QID) | ORAL | Status: DC | PRN

## 2019-06-24 NOTE — Consult Note (Signed)
Hematology/Oncology Consult note [facility] [phone] [phone]  Patient Care Team: Patient, No Pcp Per as PCP - General (General Practice)   Name of the patient: Nathan [name]  ZC:[id number]  07/05/1961   Date of visit: 06/24/19 REASON FOR COSULTATION:  Anemia.   History of presenting illness-  58 y.o. male with PMH listed at below who presents to ER for evaluation of shortness of breath. Patient recently was admitted on 06/18/2019 due to right hand cellulitis MRSA bacteremia.  TEE showed no vegetation.  Treated with Zyvox. X-ray on arrival showed pleural effusion and bilateral lower lobe consolidation. CT chest showed left-sided empyema, consolidation, groundglass opacities consistent with infectious process.  Pulmonary edema.  Patient is status post CT-guided chest tube placement for empyema.  Elevated BNP. 2D echo 06/13/2019 showed LVEF 45 to 50%, global hypokinesis.  He has history of atrial fibrillation Heme-onc was consulted for anemia. Patient has received INFeD during this admission. Hemoglobin on 06/10/2019 was 10.1, gradually decreased.  Hemoglobin decreased to 8 on 06/22/2019.  No acute bleeding events. Today patient reports feeling tired.  Denies difficulty in breathing.  Feels bloated.  Lower extremity edema.  Appetite is good.    Review of Systems  Constitutional: Positive for fatigue. Negative for appetite change, chills, fever and unexpected weight change.  HENT:   Negative for hearing loss and voice change.   Eyes: Negative for eye problems and icterus.  Respiratory: Negative for chest tightness, cough and shortness of breath.   Cardiovascular: Positive for leg swelling. Negative for chest pain.  Gastrointestinal: Negative for abdominal distention and abdominal pain.  Endocrine: Negative for hot flashes.  Genitourinary: Negative for difficulty urinating, dysuria and frequency.   Musculoskeletal: Negative for  arthralgias.  Skin: Negative for itching and rash.  Neurological: Negative for light-headedness and numbness.  Hematological: Negative for adenopathy. Does not bruise/bleed easily.  Psychiatric/Behavioral: Negative for confusion.    No Known Allergies  Patient Active Problem List   Diagnosis Date Noted   Empyema ([place])    Sepsis ([place]) 06/10/2019     Past Medical History:  Diagnosis Date   Atrial fibrillation ([place])    Diabetes mellitus without complication ([place])    Hypertension      Past Surgical History:  Procedure Laterality Date   I&D EXTREMITY Right 06/11/2019   Procedure: IRRIGATION AND DEBRIDEMENT RIGHT THUMB;  Surgeon: Dereck Leep, MD;  Location: [facility] ORS;  Service: Orthopedics;  Laterality: Right;   TEE WITHOUT CARDIOVERSION N/A 06/14/2019   Procedure: TRANSESOPHAGEAL ECHOCARDIOGRAM (TEE);  Surgeon: Minna Merritts, MD;  Location: [facility] ORS;  Service: Cardiovascular;  Laterality: N/A;    Social History   Socioeconomic History   Marital status: Married    Spouse name: Not on file   Number of children: Not on file   Years of education: Not on file   Highest education level: Not on file  Occupational History   Not on file  Social Needs   Financial resource strain: Not on file   Food insecurity    Worry: Not on file    Inability: Not on file   Transportation needs    Medical: Not on file    Non-medical: Not on file  Tobacco Use   Smoking status: Never Smoker   Smokeless tobacco: Never Used  Substance and Sexual Activity   Alcohol use: Never    Frequency: Never   Drug use: Never   Sexual activity: Not on file  Lifestyle   Physical activity  Days per week: Not on file    Minutes per session: Not on file   Stress: Not on file  Relationships   Social connections    Talks on phone: Not on file    Gets together: Not on file    Attends religious service: Not on file    Active member of club or organization: Not on file     Attends meetings of clubs or organizations: Not on file    Relationship status: Not on file   Intimate partner violence    Fear of current or ex partner: Not on file    Emotionally abused: Not on file    Physically abused: Not on file    Forced sexual activity: Not on file  Other Topics Concern   Not on file  Social History Narrative   Not on file     Family History  Problem Relation Age of Onset   CAD Father    Diabetes Sister    Diabetes Brother    Diabetes Sister    Diabetes Sister      Current Facility-Administered Medications:    amLODipine (NORVASC) tablet 5 mg, 5 mg, Oral, Daily, Vaughan Basta, MD, 5 mg at 06/24/19 1039   insulin aspart (novoLOG) injection 0-5 Units, 0-5 Units, Subcutaneous, QHS, Vaughan Basta, MD   insulin aspart (novoLOG) injection 0-9 Units, 0-9 Units, Subcutaneous, TID WC, Vaughan Basta, MD, 2 Units at 06/24/19 1323   insulin glargine (LANTUS) injection 10 Units, 10 Units, Subcutaneous, Daily, Gouru, Aruna, MD, 10 Units at 06/22/19 1059   labetalol (NORMODYNE) injection 5 mg, 5 mg, Intravenous, Q4H PRN, Vaughan Basta, MD, 5 mg at 06/21/19 1717   linezolid (ZYVOX) tablet 600 mg, 600 mg, Oral, Q12H, Zeigler, Sandi Mealy, RPH, 600 mg at 06/24/19 1039   metoprolol succinate (TOPROL-XL) 24 hr tablet 100 mg, 100 mg, Oral, Daily, Vaughan Basta, MD, 100 mg at 06/24/19 1039   mupirocin ointment (BACTROBAN) 2 % 1 application, 1 application, Nasal, BID, Gouru, Aruna, MD, 1 application at [id number]   sodium chloride flush (NS) 0.9 % injection 3 mL, 3 mL, Intravenous, Q12H, Vaughan Basta, MD, 3 mL at 06/24/19 1041   Physical exam: ECOG  Vitals:   06/23/19 1656 06/23/19 1953 06/24/19 0519 06/24/19 0732  BP: (!) 156/82 (!) 156/85 (!) 151/79 (!) 164/84  Pulse: 68 71 71 73  Resp: 19 14 18 19   Temp: (!) 97.3 F (36.3 C) 98.2 F (36.8 C) 98.3 F (36.8 C) 97.8 F (36.6 C)  TempSrc: Oral Oral  Oral Oral  SpO2: 94% 95% 94% 95%  Weight:   168 lb 8 oz (76.4 kg)   Height:       Physical Exam  Constitutional: He is oriented to person, place, and time. No distress.  HENT:  Head: Normocephalic and atraumatic.  Mouth/Throat: No oropharyngeal exudate.  Eyes: Pupils are equal, round, and reactive to light. EOM are normal. No scleral icterus.  Neck: Normal range of motion. Neck supple.  Cardiovascular: Normal rate and regular rhythm.  No murmur heard. Pulmonary/Chest: Effort normal. No respiratory distress.  Abdominal: Soft. He exhibits no distension.  Musculoskeletal: Normal range of motion.        General: Edema present.  Neurological: He is alert and oriented to person, place, and time. He exhibits normal muscle tone.  Skin: Skin is warm and dry. He is not diaphoretic. No erythema.  Psychiatric: Affect normal.        CMP Latest Ref Rng & Units 06/23/2019  Glucose 70 - 99 mg/dL 101(H)  BUN 6 - 20 mg/dL 61(H)  Creatinine 0.61 - 1.24 mg/dL 1.65(H)  Sodium 135 - 145 mmol/L 144  Potassium 3.5 - 5.1 mmol/L 4.8  Chloride 98 - 111 mmol/L 115(H)  CO2 22 - 32 mmol/L 22  Calcium 8.9 - 10.3 mg/dL 7.8(L)  Total Protein 6.5 - 8.1 g/dL -  Total Bilirubin 0.3 - 1.2 mg/dL -  Alkaline Phos 38 - 126 U/L -  AST 15 - 41 U/L -  ALT 0 - 44 U/L -   CBC Latest Ref Rng & Units 06/24/2019  WBC 4.0 - 10.5 K/uL 6.7  Hemoglobin 13.0 - 17.0 g/dL 9.0(L)  Hematocrit 39.0 - 52.0 % 28.3(L)  Platelets 150 - 400 K/uL 455(H)    RADIOGRAPHIC STUDIES: I have personally reviewed the radiological images as listed and agreed with the findings in the report.  Dg Chest 2 View  Result Date: 06/23/2019 CLINICAL DATA:  Left-sided chest tube since yesterday. EXAM: CHEST - 2 VIEW COMPARISON:  06/21/2019 FINDINGS: Left basilar chest tube. Persistent partially loculated small left pleural effusion. Small right pleural effusion. Mild bilateral interstitial thickening likely chronic. Mild right basilar atelectasis.  Trace left pneumothorax. Stable cardiomediastinal silhouette. No aggressive osseous lesion. IMPRESSION: 1. Left basilar chest tube with a trace pneumothorax. Partially loculated small left pleural effusion. Electronically Signed   By: Kathreen Devoid   On: 06/23/2019 09:29   Dg Chest 2 View  Result Date: 06/21/2019 CLINICAL DATA:  Pleural effusion EXAM: CHEST - 2 VIEW COMPARISON:  06/19/2019 FINDINGS: No significant change in moderate left, small right pleural effusions with associated atelectasis or consolidation of the left lung. There is some degree of underlying diffuse heterogeneous and interstitial bilateral airspace opacity suggesting multifocal infection or edema. The heart and mediastinum are unremarkable. IMPRESSION: No significant change in moderate left, small right pleural effusions with associated atelectasis or consolidation of the left lung. There is some degree of underlying diffuse heterogeneous and interstitial bilateral airspace opacity suggesting multifocal infection or edema. CT may be helpful to further assess. Electronically Signed   By: Eddie Candle M.D.   On: 06/21/2019 09:57   Dg Chest 2 View  Result Date: 06/19/2019 CLINICAL DATA:  Shortness of breath EXAM: CHEST - 2 VIEW COMPARISON:  None. FINDINGS: There is airspace consolidation throughout much of the left lower lobe with small left pleural effusion. There is a subtle ill-defined area of opacity in the right upper lobe, seen only on the frontal view. Lungs elsewhere clear. Heart size and pulmonary vascularity are normal. No adenopathy. No bone lesions. IMPRESSION: 1. Extensive left lower lobe airspace consolidation consistent with pneumonia with small left pleural effusion. 2. Subtle area of opacity in the right upper lobe measuring 2.1 x 1.7 cm. Question small focus of pneumonia in this area. This area does appear somewhat nodular. 3.  Heart size within normal limits. 4.  No evident adenopathy. Followup PA and lateral chest  radiographs recommended in 3-4 weeks following trial of antibiotic therapy to ensure resolution and exclude underlying malignancy. Electronically Signed   By: Lowella Grip III M.D.   On: 06/19/2019 14:14   Ct Chest Wo Contrast  Result Date: 06/24/2019 CLINICAL DATA:  Loculated pleural effusion, status post CT-guided drain placement EXAM: CT CHEST WITHOUT CONTRAST TECHNIQUE: Multidetector CT imaging of the chest was performed following the standard protocol without IV contrast. COMPARISON:  06/21/2019 FINDINGS: Cardiovascular: Three-vessel coronary artery calcifications and/or stents. Mild cardiomegaly. No pericardial effusion. Mediastinum/Nodes: No significant  change in numerous enlarged mediastinal and hilar lymph nodes. Thyroid gland, trachea, and esophagus demonstrate no significant findings. Lungs/Pleura: Redemonstrated moderate, loculated left hydropneumothorax and small to moderate right pleural effusion, not significantly changed in volume compared to prior examination. There has been interval CT-guided placement of a pigtail chest tube about the dependent left lung base, with introduction of a small pneumothorax component. The left-sided pleura are thickened and rinded appearing and there is atelectasis or consolidation of the dependent lungs, left greater than right. There appear to be at least three substantial loculated components of the left-sided effusion, posteriorly, about the lung base, and within the major fissure (series 6, image 145). There is diffuse interlobular septal thickening (series 3, image 30). Upper Abdomen: Trace ascites in the upper abdomen. Musculoskeletal: No chest wall mass or suspicious bone lesions identified. Anasarca. IMPRESSION: 1. Redemonstrated moderate, loculated left hydropneumothorax and small to moderate right pleural effusion, not significantly changed in volume compared to prior examination. There has been interval CT-guided placement of a pigtail chest tube  about the dependent left lung base, with introduction of a small pneumothorax component. 2. The left-sided pleura are thickened and rinded appearing and there is atelectasis or consolidation of the dependent lungs, left greater than right. There appear to be at least three substantial loculated components of the left-sided effusion, posteriorly, about the lung base, and within the major fissure (series 6, image 145). 3. There is diffuse interlobular septal thickening (series 3, image 30), consistent with pulmonary edema. 4. No significant change in numerous enlarged mediastinal and hilar lymph nodes, likely reactive. 5.  Anasarca and ascites in the upper abdomen. 6.  Coronary artery disease. Electronically Signed   By: Eddie Candle M.D.   On: 06/24/2019 12:59   Ct Chest Wo Contrast  Result Date: 06/21/2019 CLINICAL DATA:  Chest x-ray today showed small right pleural effusions with associated atelectasis or consolidation of the left lung. There is some degree of underlying diffuse heterogeneous and interstitial bilateral airspace opacity suggesting multifocal infection or edema. EXAM: CT CHEST WITHOUT CONTRAST TECHNIQUE: Multidetector CT imaging of the chest was performed following the standard protocol without IV contrast. COMPARISON:  Chest x-ray on 06/21/2019 FINDINGS: Cardiovascular: Heart is mildly enlarged. There is coronary artery calcification. Small pericardial effusion measures less than 5 millimeters. There is atherosclerotic calcification of the thoracic aorta not associated with aneurysm. Mediastinum/Nodes: The visualized portion of the thyroid gland has a normal appearance. Multiple enlarged mediastinal and hilar lymph nodes are present. Prevascular lymph node is 1.4 centimeters on image 71/2. Precarinal lymph node is 1.2 centimeters on image 58/2. Esophagus is normal in appearance. Lungs/Pleura: Bilateral pleural effusions. RIGHT pleural effusion appears simple. Effusion in the LEFT lung appears  loculated and somewhat irregular. The findings raise a question possible empyema. Lack of intravenous contrast limits full evaluation. There is consolidation in the LEFT LOWER lobe and to a lesser degree in the RIGHT LOWER lobe. There are patchy ground-glass opacities within the RIGHT UPPER lobe, somewhat confluent in areas and favoring infectious process. There is subpleural septal thickening primarily within the UPPER lobes. Upper Abdomen: Small amount of perihepatic fluid. Gallbladder is present. Musculoskeletal: No chest wall mass or suspicious bone lesions identified. IMPRESSION: 1. Cardiomegaly and coronary artery disease. 2. Small pericardial effusion. 3. Bilateral pleural effusions, left greater than RIGHT. Possible LEFT empyema. 4. Bilateral lower lobe consolidation and ground-glass opacities within the RIGHT UPPER lobe, consistent with infectious process. 5. Mediastinal and hilar adenopathy, likely reactive. 6. Mild pulmonary edema. 7.  Small amount of perihepatic fluid. 8. Aortic Atherosclerosis (ICD10-I70.0). Electronically Signed   By: Nolon Nations M.D.   On: 06/21/2019 14:21   Ct Hand Right W Contrast  Result Date: 06/11/2019 CLINICAL DATA:  Sepsis. Right hand swelling. EXAM: CT OF THE UPPER RIGHT EXTREMITY WITH CONTRAST TECHNIQUE: Multidetector CT imaging of the upper right extremity was performed according to the standard protocol following intravenous contrast administration. COMPARISON:  Radiographs dated 06/10/2019 CONTRAST:  130mL OMNIPAQUE IOHEXOL 300 MG/ML  SOLN FINDINGS: Muscles and Tendons and soft tissues There is an extensive abscess extending from the dorsal aspect of the base of the thumb at the site of the soft tissue ulcer near the base of the proximal phalanx. The abscess extends into the palm involving the abductor pollicis brevis and adductor pollicis muscles extending across the palm extending to the volar surface of the head of the third metacarpal best seen on image 85 of  series 6. The abscess is at least 6.3 x 3.4 x 2.3 cm. The mass is deep to the flexor tendons of first, second and third digits at the level of the metacarpals. Bones/Joint/Cartilage No evidence of osteomyelitis or other acute abnormality. IMPRESSION: 1. Extensive abscess in the palm extending from the dorsal aspect of the base of the thumb to the volar surface of the head of the third metacarpal. 2. No evidence of osteomyelitis. Electronically Signed   By: Lorriane Shire M.D.   On: 06/11/2019 10:15   Dg Hand Complete Right  Result Date: 06/10/2019 CLINICAL DATA:  Infection in right thumb EXAM: RIGHT HAND - COMPLETE 3+ VIEW COMPARISON:  None. FINDINGS: No fracture or dislocation of the right hand. Joint spaces are well preserved. Soft tissue edema about the right thumb. IMPRESSION: Soft tissue edema about the right thumb. No fracture, dislocation, or other osseous abnormality. Electronically Signed   By: Eddie Candle M.D.   On: 06/10/2019 17:14   Ct Image Guided Fluid Drain By Catheter  Result Date: 06/22/2019 INDICATION: Loculated left effusion, concern for empyema EXAM: CT guided 12 French left chest tube insertion MEDICATIONS: The patient is currently admitted to the hospital and receiving intravenous antibiotics. The antibiotics were administered within an appropriate time frame prior to the initiation of the procedure. ANESTHESIA/SEDATION: Fentanyl 50 mcg IV; Versed 1.0 mg IV Moderate Sedation Time:  16 minutes The patient was continuously monitored during the procedure by the interventional radiology nurse under my direct supervision. COMPLICATIONS: None immediate. PROCEDURE: Informed written consent was obtained from the patient after a thorough discussion of the procedural risks, benefits and alternatives. All questions were addressed. Maximal Sterile Barrier Technique was utilized including caps, mask, sterile gowns, sterile gloves, sterile drape, hand hygiene and skin antiseptic. A timeout was  performed prior to the initiation of the procedure. Previous imaging reviewed. Patient positioned right side down decubitus. Noncontrast localization CT performed. The loculated left effusion was localized through a mid axillary lower intercostal space. Overlying skin marked. Under sterile conditions and local anesthesia, an 18 gauge 10 cm access needle was advanced into the effusion. Needle position confirmed with CT. Syringe aspiration yielded serous fluid. Sample sent for culture. Guidewire inserted followed by tract dilatation to insert a 12 [place] drain. Drain catheter position confirmed with CT. Catheter secured with Prolene suture and connected to external pleura vac. Sterile dressing applied. No immediate complication. Patient tolerated the procedure well. IMPRESSION: Successful CT-guided 12 French left chest tube insertion. Electronically Signed   By: Jerilynn Mages.  Shick M.D.   On: 06/22/2019 10:48  Assessment and plan- Patient is a 57 y.o. male who is currently admitted due to lung empyema, status post chest tube placement.  #Acute on chronic anemia, multifactorial, secondary to chronic kidney disease, as well as marrow suppression secondary to acute infection.  Patient has received 1 dose of INFeD during this admission. Iron panel was reviewed and discussed with patient.  Not consistent with iron deficiency, his clinical picture is more consistent with anemia in chronic disease.  Reticulocyte hemoglobin at 35. I do not think he would benefit from additional IV iron at this point.  If hemoglobin continues to drop, may consider starting erythropoietin therapy with Epogen.  Potential side effects of erythropoietin replacement therapy discussed with patient. I gave him my office information.  He may follow-up with me outpatient for anemia management.  #Lung empyema, note reviewed.  There is a plan for him to go to [facility] for VATS on the left side.   Thank you for allowing me to participate in the care  of this patient.  Total face to face encounter time for this patient visit was 50 min. >50% of the time was  spent in counseling and coordination of care.    Earlie Server, MD, PhD Hematology Oncology [facility] at [facility] Pager- [phone] 06/24/2019

## 2019-06-24 NOTE — Progress Notes (Addendum)
Patient seen and examined.  He had a persistent loculated left pleural complex effusion versus empyema. He is doing well clinically. Chest tube to suction without any air leak.  Decreased breath sounds left side.  Extensive discussion with Dr. Servando Snare from [place] [facility] at [facility]  He is willing to do a VATS on the left side.  He also asked me to order a CT scan which I have personally reviewed showing the persistent loculations and thickening of the pleura likely from empyema. DisCussed with Dr. Bridgett Larsson about the need for transferring the patient.  note that I spent over 35 minutes in this encounter with greater than 50% spent in coordination and counseling of his care

## 2019-06-24 NOTE — Consult Note (Signed)
[address]       [place] [address]             [id number]        Kyron A Elms Reynolds Medical Record [id number] Date of Birth: Jan 11, 1961  Referring: Hospitalist /Dr. Doristine Bosworth Primary Care: Derinda Late, MD Primary Cardiologist:No primary care provider on file.  Chief Complaint: Inadequately drained empyema left chest transferred from [facility] History of Present Illness:      Patient is a 58 year old male with relatively new diagnosis of diabetes mellitus hypertension with a hemoglobin A1c of 11.5.  He has had at least 6 months of anemia of chronic disease, history of chronic kidney disease stage IIIb.  2 weeks ago the patient was treated as an outpatient for right thumb infection which ultimately started draining pus and he was admitted to [facility].  He was discharged home, and after discharge began having increasing swelling to the point of anasarca and was readmitted.  Although he was discharged home initially on Eliquis because of transient atrial fibrillation he was unable to afford it.  When he was readmitted a TEE was performed to rule out endocarditis no vegetations were noted.  Chest x-ray and CT scan showed a complex left effusion that was somewhat loculated, pigtail catheter was placed but ineffective in drainage.  I was called this afternoon by the general surgeon at [facility] about performing drainage procedure on the left chest tomorrow, it is unclear why but he was given 5 mg of Eliquis this afternoon before transfer.  He notes that he had not been on a daily Eliquis while in the hospital.  Previous history documented [facility] system: DM2: He was diagnosed with diabetes about 6 months ago at his initial visit. His A1C at that time was 11.2. He was started on NPH 70/30 insulin 10 units BID and Metformin 500 mg BID. He was quickly experiencing nighttime hypoglycemic episodes with sugars ranging 50-80. He was able to wean himself off  insulin completely, with changes made to his lifestyle. He is currently taking Metformin 500 mg XR BID and tolerating this well. His recent blood work showed A1C stable at 6.3%. Other valuable labs showed LDL 96, creatinine 1.1, GFR 69. He continually feels nauseous and has dry heaving with the metformin. He quit taking his Metformin for about a week and his nausea improved, but didn't resolve. He has a decreased appetite because he doesn't feel well. He does have some abdominal pain, predominantly in the morning. His fasting sugars have been ranging 100-140s. He does have some neuropathy in his bilateral hands and feet.   Anemia: He has been struggling with anemia for the past 6 months since establishing with our office. His hemoglobin has been trending low - 6 months ago his Hgb was 13.1, about 3 months ago Hgb was 12.4. About 3 months ago, an iron panel and ferritin were checked which was normal (iron 111, TIBC 326.3, ferritin 162). He did hemoccult cards which were negative. His most recent CBC showed hemoglobin 10.6 and hematocrit 31.5. He does constantly feel fatigued. He notes that if he isn't working, he will sit down and fall asleep. He is unsure if he snores at night. He does not wake up in the morning feeling well-rested. He feels as though he could take a nap throughout the day. He does take Aleve daily and has done so for several years due to his back pain.   HTN: He was  started on Losartan 50 mg daily for blood pressure. He developed hyperkalemia, thought to be secondary to Losartan. He was advised to reduce his dose to 25 mg daily, and has been taking this daily. He has been monitoring his blood pressure outside of the office intermittently with readings 120-130s/70s. He notes that these readings are only relevant when he is sitting still for a couple of hours. If he walks across the room or does any activity, his blood pressure will be elevated. He does have daily headaches - he notes that he  never had a headache until he started taking medication. He does have dizziness and lightheadedness. He also notes that the metformin is causing blurred vision.    Current Activity/ Functional Status: Patient is independent with mobility/ambulation, transfers, ADL's, IADL's.   Zubrod Score: At the time of surgery this patients most appropriate activity status/level should be described as: []     0    Normal activity, no symptoms [x]     1    Restricted in physical strenuous activity but ambulatory, able to do out light work []     2    Ambulatory and capable of self care, unable to do work activities, up and about                 more than 50%  Of the time                            []     3    Only limited self care, in bed greater than 50% of waking hours []     4    Completely disabled, no self care, confined to bed or chair []     5    Moribund  Past Medical History:  Diagnosis Date   Atrial fibrillation ([place])    Diabetes mellitus without complication ([place])    Hypertension     Past Surgical History:  Procedure Laterality Date   I&D EXTREMITY Right 06/11/2019   Procedure: IRRIGATION AND DEBRIDEMENT RIGHT THUMB;  Surgeon: Dereck Leep, MD;  Location: [facility] ORS;  Service: Orthopedics;  Laterality: Right;   TEE WITHOUT CARDIOVERSION N/A 06/14/2019   Procedure: TRANSESOPHAGEAL ECHOCARDIOGRAM (TEE);  Surgeon: Minna Merritts, MD;  Location: [facility] ORS;  Service: Cardiovascular;  Laterality: N/A;    Social History   Tobacco Use  Smoking Status Never Smoker  Smokeless Tobacco Never Used    Social History   Substance and Sexual Activity  Alcohol Use Never   Frequency: Never     No Known Allergies  Current Facility-Administered Medications  Medication Dose Route Frequency Provider Last Rate Last Dose   acetaminophen (TYLENOL) tablet 650 mg  650 mg Oral Q6H PRN Pahwani, Rinka R, MD       Or   acetaminophen (TYLENOL) suppository 650 mg  650 mg Rectal Q6H PRN Pahwani,  Rinka R, MD       furosemide (LASIX) injection 40 mg  40 mg Intravenous Daily Pahwani, Rinka R, MD       insulin aspart (novoLOG) injection 0-15 Units  0-15 Units Subcutaneous TID WC Pahwani, Rinka R, MD       insulin aspart (novoLOG) injection 0-5 Units  0-5 Units Subcutaneous QHS Pahwani, Rinka R, MD       ondansetron (ZOFRAN) tablet 4 mg  4 mg Oral Q6H PRN Pahwani, Rinka R, MD       Or   ondansetron (ZOFRAN) injection 4 mg  4 mg Intravenous Q6H PRN Pahwani, Rinka R, MD        Medications Prior to Admission  Medication Sig Dispense Refill Last Dose   [START ON 06/25/2019] amLODipine (NORVASC) 5 MG tablet Take 1 tablet (5 mg total) by mouth daily.      apixaban (ELIQUIS) 5 MG TABS tablet Take 1 tablet (5 mg total) by mouth 2 (two) times daily. (Patient not taking: Reported on 06/19/2019) 60 tablet o    aspirin EC 81 MG tablet Take 81 mg by mouth daily.      insulin glargine (LANTUS) 100 UNIT/ML injection Inject 0.1 mLs (10 Units total) into the skin daily. 10 mL 11    linezolid (ZYVOX) 600 MG tablet Take 1 tablet (600 mg total) by mouth every 12 (twelve) hours. 28 tablet 0    metoprolol succinate (TOPROL-XL) 100 MG 24 hr tablet Take 1 tablet (100 mg total) by mouth daily. Take with or immediately following a meal. 30 tablet 0    mupirocin ointment (BACTROBAN) 2 % Place 1 application into the nose 2 (two) times daily. 22 g 0    oxyCODONE-acetaminophen (PERCOCET) 5-325 MG tablet Take 1 tablet by mouth every 6 (six) hours as needed for moderate pain or severe pain. 20 tablet 0     Family History  Problem Relation Age of Onset   CAD Father    Diabetes Sister    Diabetes Brother    Diabetes Sister    Diabetes Sister      Review of Systems:    Pertinent items are noted in HPI.     Cardiac Review of Systems: Y or  [    ]= no  Chest Pain Florencio.Farrier    ]  Resting SOB [ y  ] Exertional SOB  [ y ]  Vertell Limber Blue.Reese  ]   Pedal Edema [ n  ]    Palpitations [ n ] Syncope  [ n ]     Presyncope [  n ]  General Review of Systems: [Y] = yes [  ]=no Constitional: recent weight change [  ]; anorexia [  ]; fatigue [  ]; nausea [  ]; night sweats [  ]; fever [  ]; or chills [  ]                                                               Dental: Last Dentist visit:   Eye : blurred vision [  ]; diplopia [   ]; vision changes [  ];  Amaurosis fugax[  ]; Resp: cough [  ];  wheezing[  ];  hemoptysis[  ]; shortness of breath[  ]; paroxysmal nocturnal dyspnea[  ]; dyspnea on exertion[  ]; or orthopnea[  ];  GI:  gallstones[  ], vomiting[  ];  dysphagia[  ]; melena[  ];  hematochezia [  ]; heartburn[  ];   Hx of  Colonoscopy[  ]; GU: kidney stones [  ]; hematuria[  ];   dysuria [  ];  nocturia[  ];  history of     obstruction [  ]; urinary frequency [  ]             Skin: rash, swelling[  ];, hair loss[  ];  peripheral edema[  ];  or itching[  ]; Musculosketetal: myalgias[  ];  joint swelling[  ];  joint erythema[  ];  joint pain[  ];  back pain[  ];  Heme/Lymph: bruising[  ];  bleeding[  ];  anemia[  ];  Neuro: TIA[  ];  headaches[  ];  stroke[  ];  vertigo[  ];  seizures[  ];   paresthesias[  ];  difficulty walking[  ];  Psych:depression[  ]; anxiety[  ];  Endocrine: diabetes[ y ];  thyroid dysfunction[  ];               Physical Exam: BP (!) 158/84 (BP Location: Right Arm)    Pulse 70    Temp 98 F (36.7 C) (Oral)    Resp 19    SpO2 97%    General appearance: alert, cooperative, appears older than stated age and no distress Head: Normocephalic, without obvious abnormality, atraumatic Neck: no adenopathy, no carotid bruit, no JVD, supple, symmetrical, trachea midline and thyroid not enlarged, symmetric, no tenderness/mass/nodules Lymph nodes: Cervical, supraclavicular, and axillary nodes normal. Resp: diminished breath sounds bibasilar Back: symmetric, no curvature. ROM normal. No CVA tenderness. Cardio: regular rate and rhythm, S1, S2 normal, no murmur, click, rub or  gallop GI: soft, non-tender; bowel sounds normal; no masses,  no organomegaly Extremities: Mild lower extremity edema bilaterally , right thumb with obvious recent surgical drainage of infection appears to be healing neurologic: Grossly normal  Diagnostic Studies & Laboratory data:     Recent Radiology Findings:   Dg Chest 2 View  Result Date: 06/24/2019 CLINICAL DATA:  Left-sided chest tube 3 days. EXAM: CHEST - 2 VIEW COMPARISON:  06/22/2019 FINDINGS: Left basilar pleural drainage catheter unchanged. Stable left base opacification likely small to moderate effusion with associated basilar atelectasis. Right lung is clear. Cardiomediastinal silhouette and remainder of the exam is unchanged. IMPRESSION: Stable left base opacification likely small to moderate effusion with associated basilar atelectasis. Left basilar chest tube unchanged. Electronically Signed   By: Marin Olp M.D.   On: 06/24/2019 15:33   Dg Chest 2 View  Result Date: 06/23/2019 CLINICAL DATA:  Left-sided chest tube since yesterday. EXAM: CHEST - 2 VIEW COMPARISON:  06/21/2019 FINDINGS: Left basilar chest tube. Persistent partially loculated small left pleural effusion. Small right pleural effusion. Mild bilateral interstitial thickening likely chronic. Mild right basilar atelectasis. Trace left pneumothorax. Stable cardiomediastinal silhouette. No aggressive osseous lesion. IMPRESSION: 1. Left basilar chest tube with a trace pneumothorax. Partially loculated small left pleural effusion. Electronically Signed   By: Kathreen Devoid   On: 06/23/2019 09:29   Ct Chest Wo Contrast  Result Date: 06/24/2019 CLINICAL DATA:  Loculated pleural effusion, status post CT-guided drain placement EXAM: CT CHEST WITHOUT CONTRAST TECHNIQUE: Multidetector CT imaging of the chest was performed following the standard protocol without IV contrast. COMPARISON:  06/21/2019 FINDINGS: Cardiovascular: Three-vessel coronary artery calcifications and/or stents.  Mild cardiomegaly. No pericardial effusion. Mediastinum/Nodes: No significant change in numerous enlarged mediastinal and hilar lymph nodes. Thyroid gland, trachea, and esophagus demonstrate no significant findings. Lungs/Pleura: Redemonstrated moderate, loculated left hydropneumothorax and small to moderate right pleural effusion, not significantly changed in volume compared to prior examination. There has been interval CT-guided placement of a pigtail chest tube about the dependent left lung base, with introduction of a small pneumothorax component. The left-sided pleura are thickened and rinded appearing and there is atelectasis or consolidation of the dependent lungs, left greater than right. There appear to be at least three substantial loculated  components of the left-sided effusion, posteriorly, about the lung base, and within the major fissure (series 6, image 145). There is diffuse interlobular septal thickening (series 3, image 30). Upper Abdomen: Trace ascites in the upper abdomen. Musculoskeletal: No chest wall mass or suspicious bone lesions identified. Anasarca. IMPRESSION: 1. Redemonstrated moderate, loculated left hydropneumothorax and small to moderate right pleural effusion, not significantly changed in volume compared to prior examination. There has been interval CT-guided placement of a pigtail chest tube about the dependent left lung base, with introduction of a small pneumothorax component. 2. The left-sided pleura are thickened and rinded appearing and there is atelectasis or consolidation of the dependent lungs, left greater than right. There appear to be at least three substantial loculated components of the left-sided effusion, posteriorly, about the lung base, and within the major fissure (series 6, image 145). 3. There is diffuse interlobular septal thickening (series 3, image 30), consistent with pulmonary edema. 4. No significant change in numerous enlarged mediastinal and hilar lymph  nodes, likely reactive. 5.  Anasarca and ascites in the upper abdomen. 6.  Coronary artery disease. Electronically Signed   By: Eddie Candle M.D.   On: 06/24/2019 12:59     I have independently reviewed the above radiologic studies and discussed with the patient   Recent Lab Findings: Lab Results  Component Value Date   WBC 6.7 06/24/2019   HGB 9.0 (L) 06/24/2019   HCT 28.3 (L) 06/24/2019   PLT 455 (H) 06/24/2019   GLUCOSE 101 (H) 06/23/2019   CHOL 122 06/13/2019   TRIG 152 (H) 06/13/2019   HDL 21 (L) 06/13/2019   LDLCALC 71 06/13/2019   ALT 25 06/20/2019   AST 21 06/20/2019   NA 144 06/23/2019   K 4.8 06/23/2019   CL 115 (H) 06/23/2019   CREATININE 1.65 (H) 06/23/2019   BUN 61 (H) 06/23/2019   CO2 22 06/23/2019   TSH 2.826 06/13/2019   INR 1.5 (H) 06/22/2019   HGBA1C 7.2 (H) 06/13/2019   Results for orders placed or performed during the hospital encounter of 06/19/19  SARS CORONAVIRUS 2 (TAT 6-24 HRS) Nasopharyngeal Nasopharyngeal Swab     Status: None   Collection Time: 06/19/19  6:16 PM   Specimen: Nasopharyngeal Swab  Result Value Ref Range Status   SARS Coronavirus 2 NEGATIVE NEGATIVE Final    Comment: (NOTE) SARS-CoV-2 target nucleic acids are NOT DETECTED. The SARS-CoV-2 RNA is generally detectable in upper and lower respiratory specimens during the acute phase of infection. Negative results do not preclude SARS-CoV-2 infection, do not rule out co-infections with other pathogens, and should not be used as the sole basis for treatment or other patient management decisions. Negative results must be combined with clinical observations, patient history, and epidemiological information. The expected result is Negative. Fact Sheet for Patients: SugarRoll.be Fact Sheet for Healthcare Providers: https://www.woods-mathews.com/ This test is not yet approved or cleared by the [place] FDA and  has been authorized for detection  and/or diagnosis of SARS-CoV-2 by FDA under an Emergency Use Authorization (EUA). This EUA will remain  in effect (meaning this test can be used) for the duration of the COVID-19 declaration under Section 56 4(b)(1) of the Act, 21 U.S.C. section 360bbb-3(b)(1), unless the authorization is terminated or revoked sooner. Performed at [facility], [place] [address], [place], [place] [address]   CULTURE, BLOOD (ROUTINE X 2) w Reflex to ID Panel     Status: None (Preliminary result)   Collection Time: 06/22/19  12:22 AM   Specimen: BLOOD  Result Value Ref Range Status   Specimen Description BLOOD BLOOD LEFT HAND  Final   Special Requests   Final    BOTTLES DRAWN AEROBIC AND ANAEROBIC Blood Culture results may not be optimal due to an excessive volume of blood received in culture bottles   Culture   Final    NO GROWTH 2 DAYS Performed at [facility], [address] [place], [place] [address]    Report Status PENDING  Incomplete  CULTURE, BLOOD (ROUTINE X 2) w Reflex to ID Panel     Status: None (Preliminary result)   Collection Time: 06/22/19 12:22 AM   Specimen: BLOOD  Result Value Ref Range Status   Specimen Description BLOOD RIGHT ANTECUBITAL  Final   Special Requests   Final    BOTTLES DRAWN AEROBIC AND ANAEROBIC Blood Culture adequate volume   Culture   Final    NO GROWTH 2 DAYS Performed at [facility], [address] [name], [place] [address]    Report Status PENDING  Incomplete  Body fluid culture (includes gram stain)     Status: None (Preliminary result)   Collection Time: 06/22/19 10:18 AM   Specimen: Pleural Fluid  Result Value Ref Range Status   Specimen Description   Final    PLEURAL Performed at [facility], [address], [place], [place] [address]    Special Requests   Final    NONE Performed at [facility], [address] [place], [place] [address]    Gram Stain   Final    RARE WBC PRESENT,BOTH PMN AND  MONONUCLEAR NO ORGANISMS SEEN    Culture   Final    NO GROWTH 2 DAYS Performed at [facility], [place] [address] [place], [place] [address]    Report Status PENDING  Incomplete  Aerobic/Anaerobic Culture (surgical/deep wound)     Status: None (Preliminary result)   Collection Time: 06/22/19 10:18 AM   Specimen: Pleural Fluid  Result Value Ref Range Status   Specimen Description   Final    PLEURAL Performed at [facility], [address] [place], [place] [address]    Special Requests   Final    Normal Performed at [facility], [address] [place], [place] [address]    Gram Stain   Final    RARE WBC PRESENT, PREDOMINANTLY MONONUCLEAR NO ORGANISMS SEEN    Culture   Final    NO GROWTH 2 DAYS NO ANAEROBES ISOLATED; CULTURE IN PROGRESS FOR 5 DAYS Performed at [facility], [place] [address] [place], [place] [address]    Report Status PENDING  Incomplete   TEE:06/14/2019 Left ventricular ejection fraction, by visual estimation, is 50 to 55%. The left ventricle has normal function. There is mildly increased left ventricular hypertrophy. 2. Global right ventricle has normal systolic function.The right ventricular size is normal. No increase in right ventricular wall thickness. 3. Left atrial size was normal. 4. No valve vegetation noted.  Assessment / Plan:   #1 recent infection right thumb-cultured methicillin-resistant staph #2 readmission to [facility], with significant lower extremity edema anasarca-seen by cardiology for congestive heart failure #3 chronic anemia #4 chronic renal failure stage IIIb #5 inadequately drained complex left pleural effusion/empyema  As noted the patient was given a dose of Eliquis just before transfer to the [facility] for surgical consultation We will keep him n.p.o. for possible surgery tomorrow, continue their evaluation of  him that was started at [facility]. We will avoid anticoagulation at this  time  Left video-assisted thoracoscopy and drainage of complex pleural effusion was discussed and explained to the patient in detail.  He is willing to proceed.  He is very frustrated by the lack of clear information given to him while at [facility].  Grace Isaac MD      [address] [place] [address] Office [phone]   [place]  06/24/2019 6:17 PM

## 2019-06-24 NOTE — Progress Notes (Signed)
Patient provided with coupon today for Eliquis.  Per RN patient to be transferred to [facility] when bed is available.   [place], [place] [facility] [phone]

## 2019-06-24 NOTE — H&P (Signed)
History and Physical    Audwin Molitoris Traore [id number] DOB: March 21, 1961 DOA: 06/24/2019  PCP: Patient, No Pcp Per  Patient coming from: [facility] I have personally briefly reviewed patient's old medical records in [place]  Chief Complaint: Left hydropneumothorax  HPI: Khyair Brookbank Novella is a 58 y.o. male with medical history significant of insulin-dependent diabetes mellitus, hypertension, A. Fib-on Eliquis (Non compliant due to affordability issues), anemia of chronic disease, chronic kidney disease stage IIIb, right thumb cellulitis status post IND and MRSA bacteremia-on Zyvox is a direct transfer from [facility] for the concern of left hydropneumothorax.  Patient admitted on 06/19/19 due to fluid overload/anasarca-his x-ray showed infiltrates and BNP was elevated.  Patient started on antibiotics for the concern of healthcare associated pneumonia-Vanco and Rocephin was started initially which was changed to Zyvox as per ID recommendation.  Patient was given IV Lasix for the concern of acute on chronic CHF.  COVID-19 was negative.  Patient remained afebrile, no leukocytosis.  CT scan chest showed collection on left could be empyema with right upper lobe possible infiltrate.  Patient had left chest catheter placement by IR on 06/22/2019.  Labs and cultures were sent.  Per surgery Dr. Christie Nottingham real change on chest x-ray after placement of pigtail.  Dr. Dahlia Byes discussed with Dr. Debby Freiberg surgery who recommended transfer to [facility] for possible VATS.  Repeated CT chest from 06/24/2019 shows moderate loculated left hydropneumothorax and small to moderate right pleural effusion not significantly changed in volume compared to prior examination.  Upon my evaluation: Patient sitting comfortably on the bed.  Denies cough, shortness of breath, wheezing, respiratory distress, fever, chills, chest pain, palpitation, headache, blurry vision, epigastric  pain, nausea or vomiting.  Reports persistent bilateral lower leg swelling, left arm swelling.  Patient tells me that he is frustrated regarding fluid overload and prolonged hospital stay as he have to take care of his wife at Saturday because status post.  I talked to Dr. Debby Freiberg surgery regarding the patient.  We will hold anticoagulation and will keep him n.p.o. after midnight for possible VATS later tomorrow.  Review of Systems: As per HPI otherwise negative.    Past Medical History:  Diagnosis Date   Atrial fibrillation ([place])    Diabetes mellitus without complication ([place])    Hypertension     Past Surgical History:  Procedure Laterality Date   I&D EXTREMITY Right 06/11/2019   Procedure: IRRIGATION AND DEBRIDEMENT RIGHT THUMB;  Surgeon: Dereck Leep, MD;  Location: [facility] ORS;  Service: Orthopedics;  Laterality: Right;   TEE WITHOUT CARDIOVERSION N/A 06/14/2019   Procedure: TRANSESOPHAGEAL ECHOCARDIOGRAM (TEE);  Surgeon: Minna Merritts, MD;  Location: [facility] ORS;  Service: Cardiovascular;  Laterality: N/A;     reports that he has never smoked. He has never used smokeless tobacco. He reports that he does not drink alcohol or use drugs.  No Known Allergies  Family History  Problem Relation Age of Onset   CAD Father    Diabetes Sister    Diabetes Brother    Diabetes Sister    Diabetes Sister     Prior to Admission medications   Medication Sig Start Date End Date Taking? Authorizing Provider  amLODipine (NORVASC) 5 MG tablet Take 1 tablet (5 mg total) by mouth daily. 06/25/19   Demetrios Loll, MD  apixaban (ELIQUIS) 5 MG TABS tablet Take 1 tablet (5 mg total) by mouth 2 (two) times daily. Patient not taking: Reported on 06/19/2019 06/16/19  Epifanio Lesches, MD  aspirin EC 81 MG tablet Take 81 mg by mouth daily.    [provider]  insulin glargine (LANTUS) 100 UNIT/ML injection Inject 0.1 mLs (10 Units total) into the skin daily. 06/16/19    Epifanio Lesches, MD  linezolid (ZYVOX) 600 MG tablet Take 1 tablet (600 mg total) by mouth every 12 (twelve) hours. 06/16/19   Epifanio Lesches, MD  metoprolol succinate (TOPROL-XL) 100 MG 24 hr tablet Take 1 tablet (100 mg total) by mouth daily. Take with or immediately following a meal. 06/16/19   Epifanio Lesches, MD  mupirocin ointment (BACTROBAN) 2 % Place 1 application into the nose 2 (two) times daily. 06/16/19   Epifanio Lesches, MD  oxyCODONE-acetaminophen (PERCOCET) 5-325 MG tablet Take 1 tablet by mouth every 6 (six) hours as needed for moderate pain or severe pain. 06/16/19 06/15/20  Epifanio Lesches, MD    Physical Exam: Vitals:   06/24/19 1624  BP: (!) 158/84  Pulse: 70  Resp: 19  Temp: 98 F (36.7 C)  TempSrc: Oral  SpO2: 97%    Constitutional: NAD, calm, comfortable Vitals:   06/24/19 1624  BP: (!) 158/84  Pulse: 70  Resp: 19  Temp: 98 F (36.7 C)  TempSrc: Oral  SpO2: 97%   Constitutional: Alert and oriented x3, not in acute distress, communicating well.   Eyes: PERRL, lids and conjunctivae normal ENMT: Mucous membranes are moist. Posterior pharynx clear of any exudate or lesions.Normal dentition.  Neck: normal, supple, no masses, no thyromegaly Respiratory: clear to auscultation bilaterally, no wheezing, no crackles. Normal respiratory effort. No accessory muscle use.  Left-sided pigtail catheter with suction noted.  Dressing dry and intact.  No signs of bleeding or infection. Cardiovascular: Regular rate and rhythm, no murmurs / rubs / gallops.  Bilateral 3+ pitting edema positive.. 2+ pedal pulses. No carotid bruits.  Abdomen: no tenderness, no masses palpated. No hepatosplenomegaly. Bowel sounds positive.  Musculoskeletal: no clubbing / cyanosis. No joint deformity upper and lower extremities. Good ROM, no contractures. Normal muscle tone.  Left arm swollen as compared to the right.  Mildly tender at IV site.  No redness noted. Skin: no  rashes, lesions, ulcers. No induration Neurologic: CN 2-12 grossly intact. Sensation intact, DTR normal. Strength 5/5 in all 4.  Psychiatric: Normal judgment and insight. Alert and oriented x 3. Normal mood.    Labs on Admission: I have personally reviewed following labs and imaging studies  CBC: Recent Labs  Lab 06/19/19 1349 06/22/19 0604 06/24/19 0707  WBC 12.2* 6.6 6.7  NEUTROABS  --   --  5.1  HGB 8.7* 8.0* 9.0*  HCT 27.2* 25.6* 28.3*  MCV 91.9 92.4 92.2  PLT 878* 496* [id number]*   Basic Metabolic Panel: Recent Labs  Lab 06/19/19 1349 06/20/19 0407 06/21/19 0552 06/22/19 0604 06/23/19 0527  NA 140 141 141 143 144  K 5.4* 5.4* 5.2* 4.8 4.8  CL 114* 116* 113* 116* 115*  CO2 19* 20* 20* 21* 22  GLUCOSE 176* 138* 91 128* 101*  BUN 52* 51* 58* 60* 61*  CREATININE 1.60* 1.48* 1.65* 1.73* 1.65*  CALCIUM 8.4* 8.0* 8.1* 7.9* 7.8*   GFR: Estimated Creatinine Clearance: 50 mL/min (A) (by C-G formula based on SCr of 1.65 mg/dL (H)). Liver Function Tests: Recent Labs  Lab 06/20/19 0407  AST 21  ALT 25  ALKPHOS 287*  BILITOT 0.6  PROT 5.6*  ALBUMIN 2.2*   No results for input(s): LIPASE, AMYLASE in the last 168 hours. No  results for input(s): AMMONIA in the last 168 hours. Coagulation Profile: Recent Labs  Lab 06/19/19 1349 06/22/19 0604  INR 1.2 1.5*   Cardiac Enzymes: No results for input(s): CKTOTAL, CKMB, CKMBINDEX, TROPONINI in the last 168 hours. BNP (last 3 results) No results for input(s): PROBNP in the last 8760 hours. HbA1C: No results for input(s): HGBA1C in the last 72 hours. CBG: Recent Labs  Lab 06/23/19 1216 06/23/19 1654 06/23/19 2058 06/24/19 0806 06/24/19 1235  GLUCAP 146* 154* 165* 132* 190*   Lipid Profile: No results for input(s): CHOL, HDL, LDLCALC, TRIG, CHOLHDL, LDLDIRECT in the last 72 hours. Thyroid Function Tests: No results for input(s): TSH, T4TOTAL, FREET4, T3FREE, THYROIDAB in the last 72 hours. Anemia Panel: Recent Labs      06/24/19 0707  RETICCTPCT 1.7   Urine analysis:    Component Value Date/Time   COLORURINE YELLOW (A) 06/23/2019 0331   APPEARANCEUR CLOUDY (A) 06/23/2019 0331   LABSPEC 1.019 06/23/2019 0331   PHURINE 5.0 06/23/2019 0331   GLUCOSEU NEGATIVE 06/23/2019 0331   HGBUR MODERATE (A) 06/23/2019 0331   BILIRUBINUR NEGATIVE 06/23/2019 0331   KETONESUR NEGATIVE 06/23/2019 0331   PROTEINUR 100 (A) 06/23/2019 0331   NITRITE NEGATIVE 06/23/2019 0331   LEUKOCYTESUR NEGATIVE 06/23/2019 0331    Radiological Exams on Admission: Dg Chest 2 View  Result Date: 06/24/2019 CLINICAL DATA:  Left-sided chest tube 3 days. EXAM: CHEST - 2 VIEW COMPARISON:  06/22/2019 FINDINGS: Left basilar pleural drainage catheter unchanged. Stable left base opacification likely small to moderate effusion with associated basilar atelectasis. Right lung is clear. Cardiomediastinal silhouette and remainder of the exam is unchanged. IMPRESSION: Stable left base opacification likely small to moderate effusion with associated basilar atelectasis. Left basilar chest tube unchanged. Electronically Signed   By: Marin Olp M.D.   On: 06/24/2019 15:33   Dg Chest 2 View  Result Date: 06/23/2019 CLINICAL DATA:  Left-sided chest tube since yesterday. EXAM: CHEST - 2 VIEW COMPARISON:  06/21/2019 FINDINGS: Left basilar chest tube. Persistent partially loculated small left pleural effusion. Small right pleural effusion. Mild bilateral interstitial thickening likely chronic. Mild right basilar atelectasis. Trace left pneumothorax. Stable cardiomediastinal silhouette. No aggressive osseous lesion. IMPRESSION: 1. Left basilar chest tube with a trace pneumothorax. Partially loculated small left pleural effusion. Electronically Signed   By: Kathreen Devoid   On: 06/23/2019 09:29   Ct Chest Wo Contrast  Result Date: 06/24/2019 CLINICAL DATA:  Loculated pleural effusion, status post CT-guided drain placement EXAM: CT CHEST WITHOUT CONTRAST  TECHNIQUE: Multidetector CT imaging of the chest was performed following the standard protocol without IV contrast. COMPARISON:  06/21/2019 FINDINGS: Cardiovascular: Three-vessel coronary artery calcifications and/or stents. Mild cardiomegaly. No pericardial effusion. Mediastinum/Nodes: No significant change in numerous enlarged mediastinal and hilar lymph nodes. Thyroid gland, trachea, and esophagus demonstrate no significant findings. Lungs/Pleura: Redemonstrated moderate, loculated left hydropneumothorax and small to moderate right pleural effusion, not significantly changed in volume compared to prior examination. There has been interval CT-guided placement of a pigtail chest tube about the dependent left lung base, with introduction of a small pneumothorax component. The left-sided pleura are thickened and rinded appearing and there is atelectasis or consolidation of the dependent lungs, left greater than right. There appear to be at least three substantial loculated components of the left-sided effusion, posteriorly, about the lung base, and within the major fissure (series 6, image 145). There is diffuse interlobular septal thickening (series 3, image 30). Upper Abdomen: Trace ascites in the upper abdomen. Musculoskeletal:  No chest wall mass or suspicious bone lesions identified. Anasarca. IMPRESSION: 1. Redemonstrated moderate, loculated left hydropneumothorax and small to moderate right pleural effusion, not significantly changed in volume compared to prior examination. There has been interval CT-guided placement of a pigtail chest tube about the dependent left lung base, with introduction of a small pneumothorax component. 2. The left-sided pleura are thickened and rinded appearing and there is atelectasis or consolidation of the dependent lungs, left greater than right. There appear to be at least three substantial loculated components of the left-sided effusion, posteriorly, about the lung base, and  within the major fissure (series 6, image 145). 3. There is diffuse interlobular septal thickening (series 3, image 30), consistent with pulmonary edema. 4. No significant change in numerous enlarged mediastinal and hilar lymph nodes, likely reactive. 5.  Anasarca and ascites in the upper abdomen. 6.  Coronary artery disease. Electronically Signed   By: Eddie Candle M.D.   On: 06/24/2019 12:59   Assessment/Plan Principal Problem:   Hydropneumothorax Active Problems:   CKD (chronic kidney disease), stage III   Hypertension   Insulin dependent diabetes mellitus type IA (HCC)   Anemia of chronic disease   Atrial fibrillation, chronic (HCC)    Left-sided hydropneumothorax: -Reviewed CT chest from this morning. -Patient has pigtail catheter placement by IR on 06/22/2019.  Repeat chest x-ray at [facility] hospital shows no changes. -Patient is schedule for VATS later tomorrow by Dr. Debby Freiberg surgery  -We will keep him n.p.o. after midnight.  No anticoagulation. -Zofran PRN for nausea and vomiting. -Monitor vitals closely.  Complex left-sided pleural effusion/empyema: -Status post IR guided pigtail tube placement on 06/22/2019. -No changes noted on follow-up chest x-ray. -Patient will be n.p.o. after midnight for VATS by thoracic surgery.  Acute on chronic combined systolic and diastolic CHF: -With lower extremity edema.  BNP elevated at 3281. -Recent ejection fraction: 45 to 50%. -Strict INO's and daily weight.  Continue Lasix 40 mg IV daily. -Monitor magnesium and potassium level and replace as needed.  History of right hand cellulitis with MRSA bacteremia and MRSA abscess of right thumb: -We will continue Zyvox 600 mg p.o. twice daily-as per ID recommendation-last dose will be on 06/29/2019. -TEE was negative during the previous hospitalization.  A. fib: Rate controlled. -We will hold Eliquis for now.  Continue Lopressor. -On telemetry.  Hypertension:  Stable -We will continue amlodipine and metoprolol. -Monitor blood pressure closely.  Insulin-dependent diabetes mellitus: -We will continue sliding scale hold Levemir tonight as patient will be n.p.o. after midnight -Resume Levemir tomorrow.  Monitor blood sugar closely.  CKD stage III: Stable -Avoid nephrotoxic medication. -Monitor BMP closely.  Anemia of chronic disease: -Likely secondary to CKD.  H&H is stable -We will monitor H&H closely due to history of hydropneumothorax. -Transfuse if indicated.  DVT prophylaxis: TED/SCD-no Lovenox due to hydropneumothorax.   Code Status: Full code  family Communication: None present at bedside.  Plan of care discussed with patient in length and he verbalized understanding and agreed with it. Disposition Plan: TBD Consults called: Thoracic surgery Dr.  Servando Snare admission status: Inpatient  Mckinley Jewel MD [facility] Pager [phone]  If 7PM-7AM, please contact night-coverage [email or web address] Password [email or web address]  06/24/2019, 5:42 PM

## 2019-06-24 NOTE — Discharge Summary (Signed)
[facility] NAME: Nathan [name]    MR#:  [id number]  DATE OF BIRTH:  1961-09-13  DATE OF ADMISSION:  06/19/2019   ADMITTING PHYSICIAN: Nicholes Mango, MD  DATE OF DISCHARGE: 06/24/2019 PRIMARY CARE PHYSICIAN: Patient, No Pcp Per   ADMISSION DIAGNOSIS:  Shortness of breath [R06.02] Generalized edema [R60.1] Healthcare-associated pneumonia [J18.9] DISCHARGE DIAGNOSIS:  Principal Problem:   Empyema ([place]) Active Problems:   Sepsis ([place])  SECONDARY DIAGNOSIS:   Past Medical History:  Diagnosis Date   Atrial fibrillation ([place])    Diabetes mellitus without complication ([place])    Hypertension    HOSPITAL COURSE:  Sepsis ([place]) #Healthcare associated pneumonia-suspected admission but question able now. IV Rocephin and vancomycin were given in the ED. Per Dr. Levester Fresh, changed back to Zyvox COVID negative. Procalcitonin is not high.  White blood cell count is not high.  Patient does not have fever. CT scan chest reported collection on left could be empyema with right upper lobe possible infiltrate. S/p left chest catheter placement by IR 06/22/19.  # Pleural effusion/empyema   S/p IR guided drainage tube placement.  Labs and cultures are sent. Per Dr. Dahlia Byes, who discussed with Dr. Genevive Bi, No real change on CXr after placement of pigtail. Dr. Genevive Bi is out of town for the next 10 days and he may need to be transfer to [facility] for possible VATS. Dr. Dahlia Byes discussed with Dr. Servando Snare from [place] [facility] at [facility], who will do VATS tomorrow.  Repeated CT scan showing the persistent loculations and thickening of the pleura likely from empyema per Dr. Dahlia Byes.  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 Renal function is better.  #Hyperkalemia given Lokelma and  Lasix.  Improved.  #Recent history of right hand cellulitis with MRSA bacteremia and MRSA abscess of the right thumb TEE was negative during the previous admission Patient was seen by infectious disease Dr. Levester Fresh and patient was discharged home on linezolid for a total of 14 days on the day of discharge Called orthopedic consult as patient was supposed to go for suture removal. Ortho suggested good healing and advised to take the sutures off.  ID suggested to continue and finish the course of oral linezolid.  #Chronic atrial fibrillation rate controlled Continue baby aspirin and resumed Eliquis.  Continue Lopressor. Case management consult placed regarding medication assistance with Eliquis patient states he cannot afford it. Hold Eliquis for surgery.  #Insulin requiring diabetes mellitus Sliding scale insulin and continue Levemir  CKD stage III.  Stable.  Anemia of chronic disease. Needs iron infusion, goal hemoglobin 10.0 per cardiologist.  Follow-up hematologist recommendation.  DVT prophylaxis with Lovenox subcu  I discussed with Dr. Dahlia Byes. I called transfer center this morning.  No bed available is so far. DISCHARGE CONDITIONS:  Guarded, transfer to [facility] if bed is available. CONSULTS OBTAINED:  Treatment Team:  Tsosie Billing, MD Dereck Leep, MD Earlie Server, MD Grace Isaac, MD DRUG ALLERGIES:  No Known Allergies DISCHARGE MEDICATIONS:   Allergies as of 06/24/2019   No Known Allergies     Medication List    TAKE these medications   amLODipine 5 MG tablet Commonly known as: NORVASC Take 1 tablet (5 mg total) by mouth daily. Start taking on: June 25, 2019   apixaban  5 MG Tabs tablet Commonly known as: ELIQUIS Take 1 tablet (5 mg total) by mouth 2 (two) times daily.   aspirin EC 81 MG tablet Take 81 mg by mouth daily.   insulin glargine 100 UNIT/ML injection Commonly known as: LANTUS Inject 0.1 mLs (10 Units total)  into the skin daily.   linezolid 600 MG tablet Commonly known as: ZYVOX Take 1 tablet (600 mg total) by mouth every 12 (twelve) hours.   metoprolol succinate 100 MG 24 hr tablet Commonly known as: TOPROL-XL Take 1 tablet (100 mg total) by mouth daily. Take with or immediately following a meal.   mupirocin ointment 2 % Commonly known as: BACTROBAN Place 1 application into the nose 2 (two) times daily.   oxyCODONE-acetaminophen 5-325 MG tablet Commonly known as: Percocet Take 1 tablet by mouth every 6 (six) hours as needed for moderate pain or severe pain.        DISCHARGE INSTRUCTIONS:  See AVS.  If you experience worsening of your admission symptoms, develop shortness of breath, life threatening emergency, suicidal or homicidal thoughts you must seek medical attention immediately by calling 911 or calling your MD immediately  if symptoms less severe.  You Must read complete instructions/literature along with all the possible adverse reactions/side effects for all the Medicines you take and that have been prescribed to you. Take any new Medicines after you have completely understood and accpet all the possible adverse reactions/side effects.   Please note  You were cared for by a hospitalist during your hospital stay. If you have any questions about your discharge medications or the care you received while you were in the hospital after you are discharged, you can call the unit and asked to speak with the hospitalist on call if the hospitalist that took care of you is not available. Once you are discharged, your primary care physician will handle any further medical issues. Please note that NO REFILLS for any discharge medications will be authorized once you are discharged, as it is imperative that you return to your primary care physician (or establish a relationship with a primary care physician if you do not have one) for your aftercare needs so that they can reassess your need for  medications and monitor your lab values.    On the day of Discharge:  VITAL SIGNS:  Blood pressure (!) 164/84, pulse 73, temperature 97.8 F (36.6 C), temperature source Oral, resp. rate 19, height 5\' 9"  (1.753 m), weight 76.4 kg, SpO2 95 %. PHYSICAL EXAMINATION:  GENERAL:  58 y.o.-year-old patient lying in the bed with no acute distress.  EYES: Pupils equal, round, reactive to light and accommodation. No scleral icterus. Extraocular muscles intact.  HEENT: Head atraumatic, normocephalic.  NECK:  Supple, no jugular venous distention. No thyroid enlargement, no tenderness.  LUNGS: Normal breath sounds bilaterally, no wheezing, rales,rhonchi or crepitation. No use of accessory muscles of respiration.  Chest tube in situ with drainage. CARDIOVASCULAR: S1, S2 normal. No murmurs, rubs, or gallops.  ABDOMEN: Soft, non-tender, non-distended. Bowel sounds present. No organomegaly or mass.  EXTREMITIES: No pedal edema, cyanosis, or clubbing.  NEUROLOGIC: Cranial nerves II through XII are intact. Muscle strength 5/5 in all extremities. Sensation intact. Gait not checked.  PSYCHIATRIC: The patient is alert and oriented x 3.  SKIN: No obvious rash, lesion, or ulcer.  DATA REVIEW:   CBC Recent Labs  Lab 06/24/19 0707  WBC 6.7  HGB 9.0*  HCT 28.3*  PLT 455*    Chemistries  Recent Labs  Lab 06/20/19 0407  06/23/19 0527  NA 141   < > 144  K 5.4*   < > 4.8  CL 116*   < > 115*  CO2 20*   < > 22  GLUCOSE 138*   < > 101*  BUN 51*   < > 61*  CREATININE 1.48*   < > 1.65*  CALCIUM 8.0*   < > 7.8*  AST 21  --   --   ALT 25  --   --   ALKPHOS 287*  --   --   BILITOT 0.6  --   --    < > = values in this interval not displayed.     Microbiology Results  Results for orders placed or performed during the hospital encounter of 06/19/19  SARS CORONAVIRUS 2 (TAT 6-24 HRS) Nasopharyngeal Nasopharyngeal Swab     Status: None   Collection Time: 06/19/19  6:16 PM   Specimen: Nasopharyngeal Swab    Result Value Ref Range Status   SARS Coronavirus 2 NEGATIVE NEGATIVE Final    Comment: (NOTE) SARS-CoV-2 target nucleic acids are NOT DETECTED. The SARS-CoV-2 RNA is generally detectable in upper and lower respiratory specimens during the acute phase of infection. Negative results do not preclude SARS-CoV-2 infection, do not rule out co-infections with other pathogens, and should not be used as the sole basis for treatment or other patient management decisions. Negative results must be combined with clinical observations, patient history, and epidemiological information. The expected result is Negative. Fact Sheet for Patients: SugarRoll.be Fact Sheet for Healthcare Providers: https://www.woods-mathews.com/ This test is not yet approved or cleared by the [place] FDA and  has been authorized for detection and/or diagnosis of SARS-CoV-2 by FDA under an Emergency Use Authorization (EUA). This EUA will remain  in effect (meaning this test can be used) for the duration of the COVID-19 declaration under Section 56 4(b)(1) of the Act, 21 U.S.C. section 360bbb-3(b)(1), unless the authorization is terminated or revoked sooner. Performed at [facility], [place] [address], [place], [place] [address]   CULTURE, BLOOD (ROUTINE X 2) w Reflex to ID Panel     Status: None (Preliminary result)   Collection Time: 06/22/19 12:22 AM   Specimen: BLOOD  Result Value Ref Range Status   Specimen Description BLOOD BLOOD LEFT HAND  Final   Special Requests   Final    BOTTLES DRAWN AEROBIC AND ANAEROBIC Blood Culture results may not be optimal due to an excessive volume of blood received in culture bottles   Culture   Final    NO GROWTH 2 DAYS Performed at [facility], [address], [place], [place] [address]    Report Status PENDING  Incomplete  CULTURE, BLOOD (ROUTINE X 2) w Reflex to ID Panel     Status: None (Preliminary result)    Collection Time: 06/22/19 12:22 AM   Specimen: BLOOD  Result Value Ref Range Status   Specimen Description BLOOD RIGHT ANTECUBITAL  Final   Special Requests   Final    BOTTLES DRAWN AEROBIC AND ANAEROBIC Blood Culture adequate volume   Culture   Final    NO GROWTH 2 DAYS Performed at [facility], [address] [place], [place] [address]    Report Status PENDING  Incomplete  Body fluid culture (includes gram stain)     Status: None (Preliminary result)   Collection Time: 06/22/19 10:18 AM   Specimen: Pleural Fluid  Result Value Ref  Range Status   Specimen Description   Final    PLEURAL Performed at [facility], [address] [place], [place] [address]    Special Requests   Final    NONE Performed at [facility], [place], [place] [address]    Gram Stain   Final    RARE WBC PRESENT,BOTH PMN AND MONONUCLEAR NO ORGANISMS SEEN    Culture   Final    NO GROWTH 2 DAYS Performed at [facility], [place] [address], [place], [place] [address]    Report Status PENDING  Incomplete  Aerobic/Anaerobic Culture (surgical/deep wound)     Status: None (Preliminary result)   Collection Time: 06/22/19 10:18 AM   Specimen: Pleural Fluid  Result Value Ref Range Status   Specimen Description   Final    PLEURAL Performed at [facility], [address], [place], [place] [address]    Special Requests   Final    Normal Performed at [facility], [address] [place], [place] [address]    Gram Stain   Final    RARE WBC PRESENT, PREDOMINANTLY MONONUCLEAR NO ORGANISMS SEEN    Culture   Final    NO GROWTH 2 DAYS Performed at [facility], [place] [address], [place], [place] [address]    Report Status PENDING  Incomplete    RADIOLOGY:  Ct Chest Wo Contrast  Result Date: 06/24/2019 CLINICAL DATA:  Loculated pleural effusion, status post CT-guided drain placement EXAM: CT CHEST WITHOUT CONTRAST TECHNIQUE:  Multidetector CT imaging of the chest was performed following the standard protocol without IV contrast. COMPARISON:  06/21/2019 FINDINGS: Cardiovascular: Three-vessel coronary artery calcifications and/or stents. Mild cardiomegaly. No pericardial effusion. Mediastinum/Nodes: No significant change in numerous enlarged mediastinal and hilar lymph nodes. Thyroid gland, trachea, and esophagus demonstrate no significant findings. Lungs/Pleura: Redemonstrated moderate, loculated left hydropneumothorax and small to moderate right pleural effusion, not significantly changed in volume compared to prior examination. There has been interval CT-guided placement of a pigtail chest tube about the dependent left lung base, with introduction of a small pneumothorax component. The left-sided pleura are thickened and rinded appearing and there is atelectasis or consolidation of the dependent lungs, left greater than right. There appear to be at least three substantial loculated components of the left-sided effusion, posteriorly, about the lung base, and within the major fissure (series 6, image 145). There is diffuse interlobular septal thickening (series 3, image 30). Upper Abdomen: Trace ascites in the upper abdomen. Musculoskeletal: No chest wall mass or suspicious bone lesions identified. Anasarca. IMPRESSION: 1. Redemonstrated moderate, loculated left hydropneumothorax and small to moderate right pleural effusion, not significantly changed in volume compared to prior examination. There has been interval CT-guided placement of a pigtail chest tube about the dependent left lung base, with introduction of a small pneumothorax component. 2. The left-sided pleura are thickened and rinded appearing and there is atelectasis or consolidation of the dependent lungs, left greater than right. There appear to be at least three substantial loculated components of the left-sided effusion, posteriorly, about the lung base, and within the  major fissure (series 6, image 145). 3. There is diffuse interlobular septal thickening (series 3, image 30), consistent with pulmonary edema. 4. No significant change in numerous enlarged mediastinal and hilar lymph nodes, likely reactive. 5.  Anasarca and ascites in the upper abdomen. 6.  Coronary artery disease. Electronically Signed   By: Eddie Candle M.D.   On: 06/24/2019 12:59  Management plans discussed with the patient, family and they are in agreement.  CODE STATUS: Full Code   TOTAL TIME TAKING CARE OF THIS PATIENT: 45 minutes.    Demetrios Loll M.D on 06/24/2019 at 2:39 PM  Between 7am to 6pm - Pager - [phone]  After 6pm go to [email or web address] - Proofreader  [facility]  Office  [phone]  CC: Primary care physician; Patient, No Pcp Per   Note: This dictation was prepared with Dragon dictation along with smaller phrase technology. Any transcriptional errors that result from this process are unintentional.

## 2019-06-24 NOTE — Progress Notes (Signed)
[place] for Apixaban Indication: atrial fibrillation  No Known Allergies  Patient Measurements: Height: 5\' 9"  (175.3 cm) Weight: 168 lb 8 oz (76.4 kg) IBW/kg (Calculated) : 70.7 Heparin Dosing Weight:    Vital Signs: Temp: 97.8 F (36.6 C) (10/04 0732) Temp Source: Oral (10/04 0732) BP: 164/84 (10/04 0732) Pulse Rate: 73 (10/04 0732)  Labs: Recent Labs    06/22/19 0604 06/23/19 0527 06/24/19 0707  HGB 8.0*  --  9.0*  HCT 25.6*  --  28.3*  PLT 496*  --  455*  LABPROT 17.5*  --   --   INR 1.5*  --   --   CREATININE 1.73* 1.65*  --     Estimated Creatinine Clearance: 50 mL/min (A) (by C-G formula based on SCr of 1.65 mg/dL (H)).   Medical History: Past Medical History:  Diagnosis Date  . Atrial fibrillation ([place])   . Diabetes mellitus without complication ([place])   . Hypertension     Assessment: Patient is a 58yo male with history of afib. Pharmacy consulted for Apixaban dosing.   Plan:  Continue Apixaban 5mg  PO bid. Follow CBC every 3 days per protocol.  MLS, RPh   06/24/2019 1:26 PM

## 2019-06-25 DIAGNOSIS — I482 Chronic atrial fibrillation, unspecified: Secondary | ICD-10-CM

## 2019-06-25 DIAGNOSIS — J948 Other specified pleural conditions: Secondary | ICD-10-CM

## 2019-06-25 DIAGNOSIS — I5043 Acute on chronic combined systolic (congestive) and diastolic (congestive) heart failure: Secondary | ICD-10-CM

## 2019-06-25 DIAGNOSIS — D638 Anemia in other chronic diseases classified elsewhere: Secondary | ICD-10-CM

## 2019-06-25 DIAGNOSIS — L03113 Cellulitis of right upper limb: Secondary | ICD-10-CM

## 2019-06-25 DIAGNOSIS — I10 Essential (primary) hypertension: Secondary | ICD-10-CM

## 2019-06-25 DIAGNOSIS — E109 Type 1 diabetes mellitus without complications: Secondary | ICD-10-CM

## 2019-06-25 LAB — APTT
aPTT: 31 seconds (ref 24–36)
aPTT: 32 seconds (ref 24–36)

## 2019-06-25 LAB — CBC
HCT: 25 % — ABNORMAL LOW (ref 39.0–52.0)
HCT: 28.7 % — ABNORMAL LOW (ref 39.0–52.0)
Hemoglobin: 8.1 g/dL — ABNORMAL LOW (ref 13.0–17.0)
Hemoglobin: 9.3 g/dL — ABNORMAL LOW (ref 13.0–17.0)
MCH: 29.9 pg (ref 26.0–34.0)
MCH: 30.1 pg (ref 26.0–34.0)
MCHC: 32.4 g/dL (ref 30.0–36.0)
MCHC: 32.4 g/dL (ref 30.0–36.0)
MCV: 92.3 fL (ref 80.0–100.0)
MCV: 92.9 fL (ref 80.0–100.0)
Platelets: 404 10*3/uL — ABNORMAL HIGH (ref 150–400)
Platelets: 389 10*3/uL (ref 150–400)
RBC: 2.71 MIL/uL — ABNORMAL LOW (ref 4.22–5.81)
RBC: 3.09 MIL/uL — ABNORMAL LOW (ref 4.22–5.81)
RDW: 13.2 % (ref 11.5–15.5)
RDW: 13.2 % (ref 11.5–15.5)
WBC: 7 10*3/uL (ref 4.0–10.5)
WBC: 8.2 10*3/uL (ref 4.0–10.5)
nRBC: 0 % (ref 0.0–0.2)
nRBC: 0 % (ref 0.0–0.2)

## 2019-06-25 LAB — BASIC METABOLIC PANEL
Anion gap: 6 (ref 5–15)
Anion gap: 8 (ref 5–15)
BUN: 64 mg/dL — ABNORMAL HIGH (ref 6–20)
BUN: 61 mg/dL — ABNORMAL HIGH (ref 6–20)
CO2: 22 mmol/L (ref 22–32)
CO2: 21 mmol/L — ABNORMAL LOW (ref 22–32)
Calcium: 7.8 mg/dL — ABNORMAL LOW (ref 8.9–10.3)
Calcium: 8 mg/dL — ABNORMAL LOW (ref 8.9–10.3)
Chloride: 114 mmol/L — ABNORMAL HIGH (ref 98–111)
Chloride: 115 mmol/L — ABNORMAL HIGH (ref 98–111)
Creatinine, Ser: 1.87 mg/dL — ABNORMAL HIGH (ref 0.61–1.24)
Creatinine, Ser: 1.8 mg/dL — ABNORMAL HIGH (ref 0.61–1.24)
GFR calc Af Amer: 46 mL/min — ABNORMAL LOW (ref >60)
GFR calc Af Amer: 48 mL/min — ABNORMAL LOW (ref >60)
GFR calc non Af Amer: 39 mL/min — ABNORMAL LOW (ref >60)
GFR calc non Af Amer: 41 mL/min — ABNORMAL LOW (ref >60)
Glucose, Bld: 170 mg/dL — ABNORMAL HIGH (ref 70–99)
Glucose, Bld: 157 mg/dL — ABNORMAL HIGH (ref 70–99)
Potassium: 5.7 mmol/L — ABNORMAL HIGH (ref 3.5–5.1)
Potassium: 5.5 mmol/L — ABNORMAL HIGH (ref 3.5–5.1)
Sodium: 142 mmol/L (ref 135–145)
Sodium: 144 mmol/L (ref 135–145)

## 2019-06-25 LAB — BLOOD GAS, ARTERIAL
Acid-base deficit: 3.7 mmol/L — ABNORMAL HIGH (ref 0.0–2.0)
Bicarbonate: 20.7 mmol/L (ref 20.0–28.0)
FIO2: 0.21
O2 Saturation: 95.9 %
Patient temperature: 98.6
pCO2 arterial: 36.2 mmHg (ref 32.0–48.0)
pH, Arterial: 7.375 (ref 7.350–7.450)
pO2, Arterial: 81 mmHg — ABNORMAL LOW (ref 83.0–108.0)

## 2019-06-25 LAB — COMPREHENSIVE METABOLIC PANEL
ALT: 20 U/L (ref 0–44)
AST: 21 U/L (ref 15–41)
Albumin: 2.2 g/dL — ABNORMAL LOW (ref 3.5–5.0)
Alkaline Phosphatase: 253 U/L — ABNORMAL HIGH (ref 38–126)
Anion gap: 5 (ref 5–15)
BUN: 60 mg/dL — ABNORMAL HIGH (ref 6–20)
CO2: 21 mmol/L — ABNORMAL LOW (ref 22–32)
Calcium: 7.8 mg/dL — ABNORMAL LOW (ref 8.9–10.3)
Chloride: 115 mmol/L — ABNORMAL HIGH (ref 98–111)
Creatinine, Ser: 1.86 mg/dL — ABNORMAL HIGH (ref 0.61–1.24)
GFR calc Af Amer: 46 mL/min — ABNORMAL LOW (ref >60)
GFR calc non Af Amer: 40 mL/min — ABNORMAL LOW (ref >60)
Glucose, Bld: 191 mg/dL — ABNORMAL HIGH (ref 70–99)
Potassium: 5.3 mmol/L — ABNORMAL HIGH (ref 3.5–5.1)
Sodium: 141 mmol/L (ref 135–145)
Total Bilirubin: 0.8 mg/dL (ref 0.3–1.2)
Total Protein: 5.5 g/dL — ABNORMAL LOW (ref 6.5–8.1)

## 2019-06-25 LAB — PHOSPHORUS: Phosphorus: 4.3 mg/dL (ref 2.5–4.6)

## 2019-06-25 LAB — ABO/RH: ABO/RH(D): B POS

## 2019-06-25 LAB — TRIGLYCERIDES, BODY FLUIDS: Triglycerides, Fluid: 26 mg/dL

## 2019-06-25 LAB — PROTIME-INR
INR: 1.4 — ABNORMAL HIGH (ref 0.8–1.2)
INR: 1.4 — ABNORMAL HIGH (ref 0.8–1.2)
Prothrombin Time: 16.6 seconds — ABNORMAL HIGH (ref 11.4–15.2)
Prothrombin Time: 16.5 seconds — ABNORMAL HIGH (ref 11.4–15.2)

## 2019-06-25 LAB — PROTEIN, BODY FLUID (OTHER): Total Protein, Body Fluid Other: 2 g/dL

## 2019-06-25 LAB — BODY FLUID CULTURE: Culture: NO GROWTH

## 2019-06-25 LAB — GLUCOSE, CAPILLARY
Glucose-Capillary: 168 mg/dL — ABNORMAL HIGH (ref 70–99)
Glucose-Capillary: 145 mg/dL — ABNORMAL HIGH (ref 70–99)
Glucose-Capillary: 160 mg/dL — ABNORMAL HIGH (ref 70–99)
Glucose-Capillary: 132 mg/dL — ABNORMAL HIGH (ref 70–99)

## 2019-06-25 LAB — MAGNESIUM: Magnesium: 2.5 mg/dL — ABNORMAL HIGH (ref 1.7–2.4)

## 2019-06-25 LAB — SURGICAL PCR SCREEN
MRSA, PCR: NEGATIVE
Staphylococcus aureus: POSITIVE — AB

## 2019-06-25 LAB — PH, BODY FLUID: pH, Body Fluid: 7.5

## 2019-06-25 MED ORDER — SODIUM ZIRCONIUM CYCLOSILICATE 5 G PO PACK
5.0000 g | PACK | Freq: Once | ORAL | Status: AC
  Administered 2019-06-25: 08:00:00 5 g via ORAL
  Filled 2019-06-25: qty 1

## 2019-06-25 MED ORDER — SODIUM ZIRCONIUM CYCLOSILICATE 5 G PO PACK
5.0000 g | PACK | Freq: Once | ORAL | Status: AC
  Administered 2019-06-25: 18:00:00 5 g via ORAL
  Filled 2019-06-25: qty 1

## 2019-06-25 NOTE — Plan of Care (Signed)
  Problem: Education: Goal: Knowledge of General Education information will improve Description Including pain rating scale, medication(s)/side effects and non-pharmacologic comfort measures Outcome: Progressing   

## 2019-06-25 NOTE — Progress Notes (Signed)
      [address]       [place] [address]             [phone]      * Surgery Date in Future * Procedure(s) (LRB): VIDEO ASSISTED THORACOSCOPY (VATS)/THOROCOTOMY (Left) VIDEO BRONCHOSCOPY (N/A)   Subjective: No new complaints  Objective: Vital signs in last 24 hours: Temp:  [97.3 F (36.3 C)-98.9 F (37.2 C)] 98.9 F (37.2 C) (10/05 0901) Pulse Rate:  [66-75] 75 (10/05 1030) Cardiac Rhythm: Normal sinus rhythm (10/05 0700) Resp:  [18-19] 18 (10/05 0901) BP: (118-158)/(51-88) 157/88 (10/05 1030) SpO2:  [94 %-99 %] 99 % (10/05 0901)  General appearance: alert, cooperative and no distress Heart: regular rate and rhythm Lungs: diminished breath sounds on left  Lab Results: Recent Labs    06/24/19 0707 06/25/19 0430  WBC 6.7 7.0  HGB 9.0* 8.1*  HCT 28.3* 25.0*  PLT 455* 404*   BMET:  Recent Labs    06/23/19 0527 06/25/19 0430  NA 144 142  K 4.8 5.7*  CL 115* 114*  CO2 22 22  GLUCOSE 101* 170*  BUN 61* 64*  CREATININE 1.65* 1.87*  CALCIUM 7.8* 7.8*    PT/INR:  Recent Labs    06/25/19 0430  LABPROT 16.6*  INR 1.4*   ABG No results found for: PHART, HCO3, TCO2, ACIDBASEDEF, O2SAT CBG (last 3)  Recent Labs    06/24/19 1819 06/24/19 2105 06/25/19 0812  GLUCAP 173* 171* 168*    Assessment/Plan: S/P Procedure(s) (LRB): VIDEO ASSISTED THORACOSCOPY (VATS)/THOROCOTOMY (Left) VIDEO BRONCHOSCOPY (N/A)  1. Left Hydropneumothorax- patient will not go to operating room today, continue to hold anticoagulation, correct Hyperkalemia 2. Dispo- care per primary, plan for VATS tomorrow afternoon, okay to feed diet today if no issues from primary service, NPO at midnight,   LOS: 1 day    Ellwood Handler 06/25/2019

## 2019-06-25 NOTE — Consult Note (Signed)
Cardiology Consultation:   Patient ID: Nathan [name] MRN: [id number]; DOB: 01-09-1961  Admit date: 06/24/2019 Date of Consult: 06/25/2019  Primary Care Provider: Derinda Late, MD Primary Cardiologist: No primary care provider on file.  Primary Electrophysiologist:  None Dr. Ree Kida   Patient Profile:   Nathan [name] is a 58 y.o. male with a hx of DM2, hypertension, remote history of smoking, self-reported PAF previously on ASA (d/c'd for headache) who is being seen today for the evaluation of pre-op clearance for possible VATS procedure for at the request of Dr. Ree Kida.  History of Present Illness:   Nathan [name] had not seen cardiology in 17 years prior to his admission September 2020. He has a histroy of self-reported Atrial fibrillation dating back to 2000s at which he was started on ASA 81 mg (CHADSVASC 1). Patient was compliant with ASA until he began to get headaches, at which he stopped taking it. Patient would intermittently convert to afib because he would feel palpitations and racing heart beat. He felt he was in afib more so when he was sick. Years later the patient was diagnosed with diabetes and long term anticoagulation was dicussed, but due to financial reasons, never started.   Patient was admitted to [facility] 9/20 - 9/26 for an infected right thumb and sepsis. Cardiology was consulted for afib RVR. Echo showed 45-50%. Metoprolol 25 mg was continued for rate control. ACE/ARB were not started due to worsening creatinine. Blood cultures were positive for MRSA. TEE 9/24 showed no valvular vegetation. Patient received IVF for creatinine. Patient was discharged with abx. At home patient had worsening sob and lower leg edema.   Patient presented back to the ER 06/19/19 for sob and lower leg swelling. BNP was elevated to 3281 and potassium was 5.4. WBC was 12 (improved since discharge 16). EKG showed NSR, rate 83.  He was also noted to be anemic. Creatinine was 1.60.  CXR was  suggestive of pneumonia and small left pleural effusion. Patient had stopped taking Eliquis for financial reasons. Patient received Lokelma for hyperkalemia. He was started on IV abx and IV Lasix. After 2 days of no improvement a CT chest was obtained.  CT of the chest 10/1 showed loculated left pleural effusion and a simple right pleural effusion; consolidation in the left lower lobe as well as small pericardial effusion. Patient underwent CT-guided chest tube placement 06/22/19. Labs and cultures were sent. Follow-up CXR was unchanged and patient was transferred to [facility] 06/24/19 for possible VATS. Repeat CT chest 10/4 showed moderate loculated left hydropneumothorax and small moderate right pleural effusion no changed from prior. Patient was seen by Cardiothoracic surgery with plan for possible surgery. Anticoagulation was held in anticipation of procedure.   Patient denies history of heart attack, stent, cancer, or stroke. He denies h/o of tobacco/alcohol/drug use. Family history is positive for CABG in his father and heart attack in his grandmother. Patient work full time with heating and cooling. He is very active at his job and at home. He denies anginal symptoms or shortness of breath at rest or during activity.  Heart Pathway Score:     Past Medical History:  Diagnosis Date   Atrial fibrillation ([place])    Diabetes mellitus without complication ([address])    Hypertension     Past Surgical History:  Procedure Laterality Date   I&D EXTREMITY Right 06/11/2019   Procedure: IRRIGATION AND DEBRIDEMENT RIGHT THUMB;  Surgeon: Dereck Leep, MD;  Location: [facility] ORS;  Service: Orthopedics;  Laterality:  Right;   TEE WITHOUT CARDIOVERSION N/A 06/14/2019   Procedure: TRANSESOPHAGEAL ECHOCARDIOGRAM (TEE);  Surgeon: Minna Merritts, MD;  Location: [facility] ORS;  Service: Cardiovascular;  Laterality: N/A;     Home Medications:  Prior to Admission medications   Medication Sig Start Date End Date Taking?  Authorizing Provider  amLODipine (NORVASC) 5 MG tablet Take 1 tablet (5 mg total) by mouth daily. 06/25/19  Yes Demetrios Loll, MD  apixaban (ELIQUIS) 5 MG TABS tablet Take 1 tablet (5 mg total) by mouth 2 (two) times daily. 06/16/19  Yes Epifanio Lesches, MD  insulin glargine (LANTUS) 100 UNIT/ML injection Inject 0.1 mLs (10 Units total) into the skin daily. 06/16/19  Yes Epifanio Lesches, MD  linezolid (ZYVOX) 600 MG tablet Take 1 tablet (600 mg total) by mouth every 12 (twelve) hours. 06/16/19  Yes Epifanio Lesches, MD  metoprolol succinate (TOPROL-XL) 100 MG 24 hr tablet Take 1 tablet (100 mg total) by mouth daily. Take with or immediately following a meal. 06/16/19  Yes Epifanio Lesches, MD  oxyCODONE-acetaminophen (PERCOCET) 5-325 MG tablet Take 1 tablet by mouth every 6 (six) hours as needed for moderate pain or severe pain. 06/16/19 06/15/20 Yes Epifanio Lesches, MD  mupirocin ointment (BACTROBAN) 2 % Place 1 application into the nose 2 (two) times daily. 06/16/19   Epifanio Lesches, MD    Inpatient Medications: Scheduled Meds:  amLODipine  5 mg Oral Daily   insulin aspart  0-15 Units Subcutaneous TID WC   insulin aspart  0-5 Units Subcutaneous QHS   linezolid  600 mg Oral Q12H   metoprolol succinate  100 mg Oral Daily   Continuous Infusions:  PRN Meds: acetaminophen **OR** acetaminophen, ondansetron **OR** ondansetron (ZOFRAN) IV, oxyCODONE-acetaminophen  Allergies:   No Known Allergies  Social History:   Social History   Socioeconomic History   Marital status: Married    Spouse name: Not on file   Number of children: Not on file   Years of education: Not on file   Highest education level: Not on file  Occupational History   Not on file  Social Needs   Financial resource strain: Not on file   Food insecurity    Worry: Not on file    Inability: Not on file   Transportation needs    Medical: Not on file    Non-medical: Not on file  Tobacco  Use   Smoking status: Never Smoker   Smokeless tobacco: Never Used  Substance and Sexual Activity   Alcohol use: Never    Frequency: Never   Drug use: Never   Sexual activity: Not on file  Lifestyle   Physical activity    Days per week: Not on file    Minutes per session: Not on file   Stress: Not on file  Relationships   Social connections    Talks on phone: Not on file    Gets together: Not on file    Attends religious service: Not on file    Active member of club or organization: Not on file    Attends meetings of clubs or organizations: Not on file    Relationship status: Not on file   Intimate partner violence    Fear of current or ex partner: Not on file    Emotionally abused: Not on file    Physically abused: Not on file    Forced sexual activity: Not on file  Other Topics Concern   Not on file  Social History Narrative   Not on  file    Family History:   Family History  Problem Relation Age of Onset   CAD Father    Diabetes Sister    Diabetes Brother    Diabetes Sister    Diabetes Sister      ROS:  Please see the history of present illness.  All other ROS reviewed and negative.     Physical Exam/Data:   Vitals:   06/24/19 1624 06/25/19 0508 06/25/19 0901  BP: (!) 158/84 (!) 157/81 (!) 118/51  Pulse: 70 72 66  Resp: 19  (!) 118  Temp: 98 F (36.7 C) 98.4 F (36.9 C) 98.9 F (37.2 C)  TempSrc: Oral Oral Oral  SpO2: 97% 94% 99%   No intake or output data in the 24 hours ending 06/25/19 1015 Last 3 Weights 06/24/2019 06/23/2019 06/22/2019  Weight (lbs) 168 lb 8 oz 165 lb 4.8 oz 166 lb 4.8 oz  Weight (kg) 76.431 kg 74.98 kg 75.433 kg     There is no height or weight on file to calculate BMI.  General:  Well nourished, well developed, in no acute distress HEENT: normal Lymph: no adenopathy Neck: no JVD Endocrine:  No thryomegaly Vascular: No carotid bruits; FA pulses 2+ bilaterally without bruits  Cardiac:  normal S1, S2; RRR; no  murmur  Lungs: Left lung sound diminished;no wheezing, rhonchi or rales; left sided chest tube and drain Abd: soft, nontender, no hepatomegaly  Ext: 1-2+ edema B/L Musculoskeletal:  No deformities, BUE and BLE strength normal and equal Skin: warm and dry  Neuro:  CNs 2-12 intact, no focal abnormalities noted Psych:  Normal affect   EKG:  The EKG was personally reviewed and demonstrates:  9/29 NSR, 83 bpm, low voltage, poor R wave progression, TWI III, AvF Telemetry:  Telemetry was personally reviewed and demonstrates:  NSR, HR 60-70, no other arrhythmias noted  Relevant CV Studies:  Echo 06/13/19  1. Left ventricular ejection fraction, by visual estimation, is 45 to 50%. The left ventricle has mildly decreased function. Normal left ventricular size. There is no left ventricular hypertrophy.  2. Global right ventricle has normal systolic function.The right ventricular size is normal. No increase in right ventricular wall thickness.  3. Left atrial size was normal.  4. Right atrial size was normal.  5. The mitral valve is normal in structure. Mild mitral valve regurgitation. No evidence of mitral stenosis.  6. The tricuspid valve is normal in structure. Tricuspid valve regurgitation mild-moderate.  7. The aortic valve is normal in structure. Aortic valve regurgitation was not visualized by color flow Doppler. Structurally normal aortic valve, with no evidence of sclerosis or stenosis.  8. The pulmonic valve was normal in structure. Pulmonic valve regurgitation is not visualized by color flow Doppler.  9. Mildly elevated pulmonary artery systolic pressure. 10. The inferior vena cava is normal in size with greater than 50% respiratory variability, suggesting right atrial pressure of 3 mmHg.  Echo TEE 06/14/19  1. Left ventricular ejection fraction, by visual estimation, is 50 to 55%. The left ventricle has normal function. There is mildly increased left ventricular hypertrophy.  2. Global right  ventricle has normal systolic function.The right ventricular size is normal. No increase in right ventricular wall thickness.  3. Left atrial size was normal.  4. No valve vegetation noted.  Laboratory Data:  High Sensitivity Troponin:   Recent Labs  Lab 06/19/19 1349 06/19/19 1638  TROPONINIHS 30* 29*     Chemistry Recent Labs  Lab 06/22/19 0604 06/23/19 [id number]  06/25/19 0430  NA 143 144 142  K 4.8 4.8 5.7*  CL 116* 115* 114*  CO2 21* 22 22  GLUCOSE 128* 101* 170*  BUN 60* 61* 64*  CREATININE 1.73* 1.65* 1.87*  CALCIUM 7.9* 7.8* 7.8*  GFRNONAA 43* 46* 39*  GFRAA 50* 53* 46*  ANIONGAP 6 7 6     Recent Labs  Lab 06/20/19 0407  PROT 5.6*  ALBUMIN 2.2*  AST 21  ALT 25  ALKPHOS 287*  BILITOT 0.6   Hematology Recent Labs  Lab 06/22/19 0604 06/24/19 0707 06/25/19 0430  WBC 6.6 6.7 7.0  RBC 2.77* 3.07*   3.07* 2.71*  HGB 8.0* 9.0* 8.1*  HCT 25.6* 28.3* 25.0*  MCV 92.4 92.2 92.3  MCH 28.9 29.3 29.9  MCHC 31.3 31.8 32.4  RDW 13.2 13.2 13.2  PLT 496* 455* 404*   BNP Recent Labs  Lab 06/19/19 1750  BNP 3,281.0*    DDimer No results for input(s): DDIMER in the last 168 hours.   Radiology/Studies:  Dg Chest 2 View  Result Date: 06/24/2019 CLINICAL DATA:  Left-sided chest tube 3 days. EXAM: CHEST - 2 VIEW COMPARISON:  06/22/2019 FINDINGS: Left basilar pleural drainage catheter unchanged. Stable left base opacification likely small to moderate effusion with associated basilar atelectasis. Right lung is clear. Cardiomediastinal silhouette and remainder of the exam is unchanged. IMPRESSION: Stable left base opacification likely small to moderate effusion with associated basilar atelectasis. Left basilar chest tube unchanged. Electronically Signed   By: Marin Olp M.D.   On: 06/24/2019 15:33   Dg Chest 2 View  Result Date: 06/23/2019 CLINICAL DATA:  Left-sided chest tube since yesterday. EXAM: CHEST - 2 VIEW COMPARISON:  06/21/2019 FINDINGS: Left basilar chest  tube. Persistent partially loculated small left pleural effusion. Small right pleural effusion. Mild bilateral interstitial thickening likely chronic. Mild right basilar atelectasis. Trace left pneumothorax. Stable cardiomediastinal silhouette. No aggressive osseous lesion. IMPRESSION: 1. Left basilar chest tube with a trace pneumothorax. Partially loculated small left pleural effusion. Electronically Signed   By: Kathreen Devoid   On: 06/23/2019 09:29   Ct Chest Wo Contrast  Result Date: 06/24/2019 CLINICAL DATA:  Loculated pleural effusion, status post CT-guided drain placement EXAM: CT CHEST WITHOUT CONTRAST TECHNIQUE: Multidetector CT imaging of the chest was performed following the standard protocol without IV contrast. COMPARISON:  06/21/2019 FINDINGS: Cardiovascular: Three-vessel coronary artery calcifications and/or stents. Mild cardiomegaly. No pericardial effusion. Mediastinum/Nodes: No significant change in numerous enlarged mediastinal and hilar lymph nodes. Thyroid gland, trachea, and esophagus demonstrate no significant findings. Lungs/Pleura: Redemonstrated moderate, loculated left hydropneumothorax and small to moderate right pleural effusion, not significantly changed in volume compared to prior examination. There has been interval CT-guided placement of a pigtail chest tube about the dependent left lung base, with introduction of a small pneumothorax component. The left-sided pleura are thickened and rinded appearing and there is atelectasis or consolidation of the dependent lungs, left greater than right. There appear to be at least three substantial loculated components of the left-sided effusion, posteriorly, about the lung base, and within the major fissure (series 6, image 145). There is diffuse interlobular septal thickening (series 3, image 30). Upper Abdomen: Trace ascites in the upper abdomen. Musculoskeletal: No chest wall mass or suspicious bone lesions identified. Anasarca. IMPRESSION:  1. Redemonstrated moderate, loculated left hydropneumothorax and small to moderate right pleural effusion, not significantly changed in volume compared to prior examination. There has been interval CT-guided placement of a pigtail chest tube about the dependent left  lung base, with introduction of a small pneumothorax component. 2. The left-sided pleura are thickened and rinded appearing and there is atelectasis or consolidation of the dependent lungs, left greater than right. There appear to be at least three substantial loculated components of the left-sided effusion, posteriorly, about the lung base, and within the major fissure (series 6, image 145). 3. There is diffuse interlobular septal thickening (series 3, image 30), consistent with pulmonary edema. 4. No significant change in numerous enlarged mediastinal and hilar lymph nodes, likely reactive. 5.  Anasarca and ascites in the upper abdomen. 6.  Coronary artery disease. Electronically Signed   By: Eddie Candle M.D.   On: 06/24/2019 12:59   Ct Chest Wo Contrast  Result Date: 06/21/2019 CLINICAL DATA:  Chest x-ray today showed small right pleural effusions with associated atelectasis or consolidation of the left lung. There is some degree of underlying diffuse heterogeneous and interstitial bilateral airspace opacity suggesting multifocal infection or edema. EXAM: CT CHEST WITHOUT CONTRAST TECHNIQUE: Multidetector CT imaging of the chest was performed following the standard protocol without IV contrast. COMPARISON:  Chest x-ray on 06/21/2019 FINDINGS: Cardiovascular: Heart is mildly enlarged. There is coronary artery calcification. Small pericardial effusion measures less than 5 millimeters. There is atherosclerotic calcification of the thoracic aorta not associated with aneurysm. Mediastinum/Nodes: The visualized portion of the thyroid gland has a normal appearance. Multiple enlarged mediastinal and hilar lymph nodes are present. Prevascular lymph node  is 1.4 centimeters on image 71/2. Precarinal lymph node is 1.2 centimeters on image 58/2. Esophagus is normal in appearance. Lungs/Pleura: Bilateral pleural effusions. RIGHT pleural effusion appears simple. Effusion in the LEFT lung appears loculated and somewhat irregular. The findings raise a question possible empyema. Lack of intravenous contrast limits full evaluation. There is consolidation in the LEFT LOWER lobe and to a lesser degree in the RIGHT LOWER lobe. There are patchy ground-glass opacities within the RIGHT UPPER lobe, somewhat confluent in areas and favoring infectious process. There is subpleural septal thickening primarily within the UPPER lobes. Upper Abdomen: Small amount of perihepatic fluid. Gallbladder is present. Musculoskeletal: No chest wall mass or suspicious bone lesions identified. IMPRESSION: 1. Cardiomegaly and coronary artery disease. 2. Small pericardial effusion. 3. Bilateral pleural effusions, left greater than RIGHT. Possible LEFT empyema. 4. Bilateral lower lobe consolidation and ground-glass opacities within the RIGHT UPPER lobe, consistent with infectious process. 5. Mediastinal and hilar adenopathy, likely reactive. 6. Mild pulmonary edema. 7. Small amount of perihepatic fluid. 8. Aortic Atherosclerosis (ICD10-I70.0). Electronically Signed   By: Nolon Nations M.D.   On: 06/21/2019 14:21   Ct Image Guided Fluid Drain By Catheter  Result Date: 06/22/2019 INDICATION: Loculated left effusion, concern for empyema EXAM: CT guided 12 French left chest tube insertion MEDICATIONS: The patient is currently admitted to the hospital and receiving intravenous antibiotics. The antibiotics were administered within an appropriate time frame prior to the initiation of the procedure. ANESTHESIA/SEDATION: Fentanyl 50 mcg IV; Versed 1.0 mg IV Moderate Sedation Time:  16 minutes The patient was continuously monitored during the procedure by the interventional radiology nurse under my direct  supervision. COMPLICATIONS: None immediate. PROCEDURE: Informed written consent was obtained from the patient after a thorough discussion of the procedural risks, benefits and alternatives. All questions were addressed. Maximal Sterile Barrier Technique was utilized including caps, mask, sterile gowns, sterile gloves, sterile drape, hand hygiene and skin antiseptic. A timeout was performed prior to the initiation of the procedure. Previous imaging reviewed. Patient positioned right side down decubitus. Noncontrast  localization CT performed. The loculated left effusion was localized through a mid axillary lower intercostal space. Overlying skin marked. Under sterile conditions and local anesthesia, an 18 gauge 10 cm access needle was advanced into the effusion. Needle position confirmed with CT. Syringe aspiration yielded serous fluid. Sample sent for culture. Guidewire inserted followed by tract dilatation to insert a 12 [place] drain. Drain catheter position confirmed with CT. Catheter secured with Prolene suture and connected to external pleura vac. Sterile dressing applied. No immediate complication. Patient tolerated the procedure well. IMPRESSION: Successful CT-guided 12 French left chest tube insertion. Electronically Signed   By: Jerilynn Mages.  Shick M.D.   On: 06/22/2019 10:48    Assessment and Plan:   Pre-Op Clearance for VATS due to Left sided Hydropneumothorax and effusion Patient was admitted to [facility] on 06/19/19 for fluid overload/anasarca and elevated BNP. When patient was not improving with IV lasix and abx a CT chest was obtained. CT showed a collection on the left lung, possible empyema with right upper lobe possible infiltrate. Patient had left catheter placement 06/22/19. Patient was transferred to [facility] for possible VATS surgery tomorrow afternoon. Repeat CT from 06/24/19 shows moderate loculated left hydropneumothorax.  - Patient had recent admission for right thumb cellulitis on abx. WBC improved and  patient is afebrile.  - Patient has history of Systolic Heart Failure, Echo 40-45% on recent admission. Patient was previously on Lasix but it was discontinued for worsening creatinine. Patient is still volume up on exam. With no h/o of anginal symptoms at presents I suspect we will do further ischemic work-up for low EF as an outpatient.  - Patient has moderately controlled HTN on amlodipine and Toprol and DM (A1C 7.5) as well. Will check FLP.  - Patient has long history of rate controlled afib not on anticoagulation due to financial reasons. Patient has intermittent events of afib, worse during times of sickness. Patient has been maintaining NSR since hospitalization. Monitor for recurrence.  - Potassium is 5.7 today. Patient had received Lokelma on admission.  - METS score of 7.99 per duke Activity Statis Index. 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  - Patient denies any h/o CAD, heart attack, or stents. He has no anginal symptoms at rest or on exertion. Hs troponin 30 > 29. Further Ischemic work-up as outpatient. - Revised Cardiac Risk Index score of 2 for a 30 day risk of death, MI, or cardiac arrest  - At this point patient is volume overloaded but not able to be diuresed with worsening creatinine. Patient is maintaining NSR. Patient is very functional at baseline in his job and at home. He is a non-smoker. Given repeat CXR and CT with complex left effusion and pigtail catheter was placed but ineffective in drainage it is reasonable I suspect it will be OK to proceed with surgery per Cardiothoracic recommendations given.  Acute on Chronic systolic HF - BNP elevated to 3281 on admission - Echo showed 40-45% - Patient will need outpatient ischemic evaluation for low EF - Patient was on IV Lasix 40 mg daily >> discontinued today for worsening creatinine - Patient has moderate swelling on exam. Would prefer volume status  be more optimized prior to surgery but seems unlikely given worsening creatinine - No weights or I/Os in the chart. Monitor daily weights - Monitor for volume overload post-procedure. Caution with IVF  Right hand cellulitis with MRSA bacteremia/MRSA abscess of the right  thumb - ID following patient at [facility] - Blood cultures were +MRSA at [facility], subsequent cultures were negative - On Zyvox - TEE was negative during hospitalization  Paroxysmal Atrial fibrillation - Patient has been in NSR since admission.  - Patient was started on Eliquis at [facility], but has not continued to take it.  - CHADS VASC = 3 (Heart failure, HTN, DM)  - Continue Lopressor for rate control - Monitor for recurrence  Hyperkalemia - On admission was 4.3. Today 5.7. Patient was given Lokelma on admission - correction needed prior to surgery  HTN - Some elevated pressures - continue Metoprolol and amlodipine  DM2 - SSI per IM - A1C 7.2  CKD, Stage III - Baseline creatinine 1.4 - 1.6 - Creatinine today 1.87 >> IV lasix was held  Anemia of Chronic disease - Hgb 8.1, stable  For questions or updates, please contact [facility] Please consult [email or web address] for contact info under     Signed, Nevaen Tredway Ninfa Meeker, PA-C  06/25/2019 10:15 AM

## 2019-06-25 NOTE — Progress Notes (Signed)
Patient ID: AHKING PRETZER, male   DOB: 1961-07-02, 58 y.o.   MRN: [id number] TCTS DAILY ICU PROGRESS NOTE                   [address]            [place] [address]          [phone]   * Surgery Date in Future * Procedure(s) (LRB): VIDEO ASSISTED THORACOSCOPY (VATS)/THOROCOTOMY (Left) VIDEO BRONCHOSCOPY (N/A)  Total Length of Stay:  LOS: 1 day   Subjective: Patient feels comfortable today, he notes his potassium is always high  Objective: Vital signs in last 24 hours: Temp:  [98.1 F (36.7 C)-98.9 F (37.2 C)] 98.1 F (36.7 C) (10/05 1533) Pulse Rate:  [66-75] 66 (10/05 1533) Cardiac Rhythm: Normal sinus rhythm (10/05 0057) Resp:  [14-18] 14 (10/05 1533) BP: (118-160)/(51-88) 160/85 (10/05 1533) SpO2:  [94 %-99 %] 98 % (10/05 1533)  There were no vitals filed for this visit.  Weight change:    Hemodynamic parameters for last 24 hours:    Intake/Output from previous day: No intake/output data recorded.  Intake/Output this shift: No intake/output data recorded.  Current Meds: Scheduled Meds: . amLODipine  5 mg Oral Daily  . insulin aspart  0-15 Units Subcutaneous TID WC  . insulin aspart  0-5 Units Subcutaneous QHS  . linezolid  600 mg Oral Q12H  . metoprolol succinate  100 mg Oral Daily  . sodium zirconium cyclosilicate  5 g Oral Once   Continuous Infusions: PRN Meds:.acetaminophen **OR** acetaminophen, ondansetron **OR** ondansetron (ZOFRAN) IV, oxyCODONE-acetaminophen  General appearance: alert, cooperative and no distress Neurologic: intact Heart: regular rate and rhythm, S1, S2 normal, no murmur, click, rub or gallop Lungs: diminished breath sounds bibasilar Extremities: extremities normal, atraumatic, no cyanosis or edema Wound: No air leak from chest tube serous drainage  Lab Results: CBC: Recent Labs    06/24/19 0707 06/25/19 0430  WBC 6.7 7.0  HGB 9.0* 8.1*  HCT 28.3* 25.0*  PLT 455* 404*   BMET:  Recent Labs    06/25/19 0430 06/25/19 1118  NA 142 144  K 5.7* 5.5*  CL 114* 115*  CO2 22 21*  GLUCOSE 170* 157*  BUN 64* 61*  CREATININE 1.87* 1.80*  CALCIUM 7.8* 8.0*    CMET: Lab Results  Component Value Date   WBC 7.0 06/25/2019   HGB 8.1 (L) 06/25/2019   HCT 25.0 (L) 06/25/2019   PLT 404 (H) 06/25/2019   GLUCOSE 157 (H) 06/25/2019   CHOL 122 06/13/2019   TRIG 152 (H) 06/13/2019   HDL 21 (L) 06/13/2019   LDLCALC 71 06/13/2019   ALT 25 06/20/2019   AST 21 06/20/2019   NA 144 06/25/2019   K 5.5 (H) 06/25/2019   CL 115 (H) 06/25/2019   CREATININE 1.80 (H) 06/25/2019   BUN 61 (H) 06/25/2019   CO2 21 (L) 06/25/2019   TSH 2.826 06/13/2019   INR 1.4 (H) 06/25/2019   HGBA1C 7.2 (H) 06/13/2019      PT/INR:  Recent Labs    06/25/19 0430  LABPROT 16.6*  INR 1.4*   Radiology: No results found. Chronic Kidney Disease   Stage I     GFR >90  Stage II    GFR 60-89  Stage IIIA GFR 45-59  Stage IIIB GFR 30-44  Stage IV   GFR 15-29  Stage V    GFR  <15  Lab Results  Component Value Date  CREATININE 1.80 (H) 06/25/2019   Estimated Creatinine Clearance: 45.8 mL/min (A) (by C-G formula based on SCr of 1.8 mg/dL (H)).   Assessment/Plan: S/P Procedure(s) (LRB): VIDEO ASSISTED THORACOSCOPY (VATS)/THOROCOTOMY (Left) VIDEO BRONCHOSCOPY (N/A) Mobilize Chronic renal disease with hyperkalemia Incompletely drained loculated effusion left question empyema The patient's potassium and renal function are stable tomorrow we will proceed with bronchoscopy left video-assisted thoracoscopy drainage of presumed left empyema versus complex fusion.    Grace Isaac 06/25/2019 5:23 PM

## 2019-06-25 NOTE — Plan of Care (Signed)
  Problem: Pain Managment: Goal: General experience of comfort will improve Outcome: Progressing   Problem: Safety: Goal: Ability to remain free from injury will improve Outcome: Progressing   Problem: Skin Integrity: Goal: Risk for impaired skin integrity will decrease Outcome: Progressing   

## 2019-06-25 NOTE — Progress Notes (Signed)
PROGRESS NOTE    Nathan [name]  [id number] DOB: 1961/03/14 DOA: 06/24/2019 PCP: Derinda Late, MD   Brief Narrative:  HPI on 06/24/2019 by Dr. Sterling Big Emery is a 58 y.o. male with medical history significant of insulin-dependent diabetes mellitus, hypertension, A. Fib-on Eliquis (Non compliant due to affordability issues), anemia of chronic disease, chronic kidney disease stage IIIb, right thumb cellulitis status post IND and MRSA bacteremia-on Zyvox is a direct transfer from [facility] for the concern of left hydropneumothorax.  Patient admitted on 06/19/19 due to fluid overload/anasarca-his x-ray showed infiltrates and BNP was elevated.  Patient started on antibiotics for the concern of healthcare associated pneumonia-Vanco and Rocephin was started initially which was changed to Zyvox as per ID recommendation.  Patient was given IV Lasix for the concern of acute on chronic CHF.  COVID-19 was negative.  Patient remained afebrile, no leukocytosis.  CT scan chest showed collection on left could be empyema with right upper lobe possible infiltrate.  Patient had left chest catheter placement by IR on 06/22/2019.  Labs and cultures were sent.  Per surgery Dr. Christie Nottingham real change on chest x-ray after placement of pigtail.  Dr. Dahlia Byes discussed with Dr. Debby Freiberg surgery who recommended transfer to [facility] for possible VATS.  Repeated CT chest from 06/24/2019 shows moderate loculated left hydropneumothorax and small to moderate right pleural effusion not significantly changed in volume compared to prior examination.  Upon my evaluation: Patient sitting comfortably on the bed.  Denies cough, shortness of breath, wheezing, respiratory distress, fever, chills, chest pain, palpitation, headache, blurry vision, epigastric pain, nausea or vomiting.  Reports persistent bilateral lower leg swelling, left arm swelling.  Patient tells me that he is frustrated  regarding fluid overload and prolonged hospital stay as he have to take care of his wife at Saturday because status post.  I talked to Dr. Debby Freiberg surgery regarding the patient.  We will hold anticoagulation and will keep him n.p.o. after midnight for possible VATS later tomorrow.  Assessment & Plan   Left-sided hydropneumothorax/complex left-sided pleural effusion/empyema -Noted on CT.  Patient was transferred from [facility] to [facility] for VATS procedure by cardiothoracic surgeon. -Status post pigtail catheter placement by IR 06/22/2019 at [facility]  Repeat chest x-ray showed no changes. -As already noted, cardiothoracic surgery consulted and appreciated for VATS procedure, likely to occur later today  Acute systolic heart failure -Patient was noted to have lower extremity edema as well as an elevated BNP of 3281 on 06/19/2019 -Echocardiogram showed EF 40-45% -Patient was on IV Lasix -Continue to monitor intake and output, daily weights  Hyperkalemia -Potassium 5.7, have ordered Lokelma -Will repeat BMP  History of right-hand cellulitis with MRSA bacteremia/MRSA abscess of the right thumb -Infectious disease was following patient at [facility] -Blood cultures from [facility] 06/10/2019 +MRSA, wound culture 9/21 +MRSA -Subsequent cultures have been negative -He was placed on Zyvox 600 mg twice daily, with his last dosage on 06/29/2019 -TEE was negative during previous hospitalization  Atrial fibrillation, paroxysmal -Patient was on Eliquis, this was started at [facility] -Currently he is in sinus rhythm, rate and rhythm controlled -Continue Lopressor  Essential hypertension -BP stable, continue metoprolol, amlodipine  Diabetes mellitus, type II -Continue insulin sliding scale and CGB monitoring  -Last hemoglobin A1c 7.2 on 06/13/2019  Chronic kidney disease, stage III -Baseline creatinine approximately 1.4-1.6 as review of patient's chart while at [facility]  -Further review shows creatinine 1.25 Aug 2018, however improvement in  March and June 2020 -Currently creatinine 1.87 -Possibly secondary to use of IV Lasix, which is been held  Anemia of chronic disease -Appears stable -Continue to monitor CBC  DVT Prophylaxis  SCDs  Code Status: Full  Family Communication: None at bedside  Disposition Plan: Admitted. Pending VATS procedure today. Dispo TBD  Consultants Cardiothoracic surgery Cardiology  Procedures  None  Antibiotics   Anti-infectives (From admission, onward)   Start     Dose/Rate Route Frequency Ordered Stop   06/24/19 2200  linezolid (ZYVOX) tablet 600 mg     600 mg Oral Every 12 hours 06/24/19 1927        Subjective:   Nathan [name] seen and examined today.  Patient with no complaints this morning.  Feels breathing has improved.  Denies current chest pain, abdominal pain, nausea or vomiting, diarrhea or constipation, dizziness or headache. Objective:   Vitals:   06/24/19 1624 06/25/19 0508 06/25/19 0901  BP: (!) 158/84 (!) 157/81 (!) 118/51  Pulse: 70 72 66  Resp: 19  (!) 118  Temp: 98 F (36.7 C) 98.4 F (36.9 C) 98.9 F (37.2 C)  TempSrc: Oral Oral Oral  SpO2: 97% 94% 99%   No intake or output data in the 24 hours ending 06/25/19 0944 There were no vitals filed for this visit.  Exam  General: Well developed, well nourished, NAD, appears stated age  HEENT: NCAT, PERRLA, EOMI, Anicteic Sclera, mucous membranes moist.   Neck: Supple, no JVD, no masses  Cardiovascular: S1 S2 auscultated, no rubs, murmurs or gallops. Regular rate and rhythm.  Respiratory: Clear to auscultation bilaterally with equal chest rise  Abdomen: Soft, nontender, nondistended, + bowel sounds  Extremities: warm dry without cyanosis clubbing. +LE edema. Right hand/thumb with scars; no erythema. LUE fullness  Neuro: AAOx3, nonfocal  Psych: Normal affect and demeanor with intact judgement and insight   Data Reviewed: I  have personally reviewed following labs and imaging studies  CBC: Recent Labs  Lab 06/19/19 1349 06/22/19 0604 06/24/19 0707 06/25/19 0430  WBC 12.2* 6.6 6.7 7.0  NEUTROABS  --   --  5.1  --   HGB 8.7* 8.0* 9.0* 8.1*  HCT 27.2* 25.6* 28.3* 25.0*  MCV 91.9 92.4 92.2 92.3  PLT 878* 496* 455* [id number]*   Basic Metabolic Panel: Recent Labs  Lab 06/20/19 0407 06/21/19 0552 06/22/19 0604 06/23/19 0527 06/25/19 0430  NA 141 141 143 144 142  K 5.4* 5.2* 4.8 4.8 5.7*  CL 116* 113* 116* 115* 114*  CO2 20* 20* 21* 22 22  GLUCOSE 138* 91 128* 101* 170*  BUN 51* 58* 60* 61* 64*  CREATININE 1.48* 1.65* 1.73* 1.65* 1.87*  CALCIUM 8.0* 8.1* 7.9* 7.8* 7.8*  MG  --   --   --   --  2.5*  PHOS  --   --   --   --  4.3   GFR: Estimated Creatinine Clearance: 44.1 mL/min (A) (by C-G formula based on SCr of 1.87 mg/dL (H)). Liver Function Tests: Recent Labs  Lab 06/20/19 0407  AST 21  ALT 25  ALKPHOS 287*  BILITOT 0.6  PROT 5.6*  ALBUMIN 2.2*   No results for input(s): LIPASE, AMYLASE in the last 168 hours. No results for input(s): AMMONIA in the last 168 hours. Coagulation Profile: Recent Labs  Lab 06/19/19 1349 06/22/19 0604 06/25/19 0430  INR 1.2 1.5* 1.4*   Cardiac Enzymes: No results for input(s): CKTOTAL, CKMB, CKMBINDEX, TROPONINI in the last 168 hours. BNP (last  3 results) No results for input(s): PROBNP in the last 8760 hours. HbA1C: No results for input(s): HGBA1C in the last 72 hours. CBG: Recent Labs  Lab 06/24/19 0806 06/24/19 1235 06/24/19 1819 06/24/19 2105 06/25/19 0812  GLUCAP 132* 190* 173* 171* 168*   Lipid Profile: No results for input(s): CHOL, HDL, LDLCALC, TRIG, CHOLHDL, LDLDIRECT in the last 72 hours. Thyroid Function Tests: No results for input(s): TSH, T4TOTAL, FREET4, T3FREE, THYROIDAB in the last 72 hours. Anemia Panel: Recent Labs    06/24/19 0707  RETICCTPCT 1.7   Urine analysis:    Component Value Date/Time   COLORURINE YELLOW  (A) 06/23/2019 0331   APPEARANCEUR CLOUDY (A) 06/23/2019 0331   LABSPEC 1.019 06/23/2019 0331   PHURINE 5.0 06/23/2019 0331   GLUCOSEU NEGATIVE 06/23/2019 0331   HGBUR MODERATE (A) 06/23/2019 0331   BILIRUBINUR NEGATIVE 06/23/2019 0331   KETONESUR NEGATIVE 06/23/2019 0331   PROTEINUR 100 (A) 06/23/2019 0331   NITRITE NEGATIVE 06/23/2019 0331   LEUKOCYTESUR NEGATIVE 06/23/2019 0331   Sepsis Labs: @LABRCNTIP (procalcitonin:4,lacticidven:4)  ) Recent Results (from the past 240 hour(s))  SARS CORONAVIRUS 2 (TAT 6-24 HRS) Nasopharyngeal Nasopharyngeal Swab     Status: None   Collection Time: 06/19/19  6:16 PM   Specimen: Nasopharyngeal Swab  Result Value Ref Range Status   SARS Coronavirus 2 NEGATIVE NEGATIVE Final    Comment: (NOTE) SARS-CoV-2 target nucleic acids are NOT DETECTED. The SARS-CoV-2 RNA is generally detectable in upper and lower respiratory specimens during the acute phase of infection. Negative results do not preclude SARS-CoV-2 infection, do not rule out co-infections with other pathogens, and should not be used as the sole basis for treatment or other patient management decisions. Negative results must be combined with clinical observations, patient history, and epidemiological information. The expected result is Negative. Fact Sheet for Patients: SugarRoll.be Fact Sheet for Healthcare Providers: https://www.woods-mathews.com/ This test is not yet approved or cleared by the [place] FDA and  has been authorized for detection and/or diagnosis of SARS-CoV-2 by FDA under an Emergency Use Authorization (EUA). This EUA will remain  in effect (meaning this test can be used) for the duration of the COVID-19 declaration under Section 56 4(b)(1) of the Act, 21 U.S.C. section 360bbb-3(b)(1), unless the authorization is terminated or revoked sooner. Performed at [facility], [place] [address] [place], [place] [address]    CULTURE, BLOOD (ROUTINE X 2) w Reflex to ID Panel     Status: None (Preliminary result)   Collection Time: 06/22/19 12:22 AM   Specimen: BLOOD  Result Value Ref Range Status   Specimen Description BLOOD BLOOD LEFT HAND  Final   Special Requests   Final    BOTTLES DRAWN AEROBIC AND ANAEROBIC Blood Culture results may not be optimal due to an excessive volume of blood received in culture bottles   Culture   Final    NO GROWTH 3 DAYS Performed at [facility], [address] [place], [place] [address]    Report Status PENDING  Incomplete  CULTURE, BLOOD (ROUTINE X 2) w Reflex to ID Panel     Status: None (Preliminary result)   Collection Time: 06/22/19 12:22 AM   Specimen: BLOOD  Result Value Ref Range Status   Specimen Description BLOOD RIGHT ANTECUBITAL  Final   Special Requests   Final    BOTTLES DRAWN AEROBIC AND ANAEROBIC Blood Culture adequate volume   Culture   Final    NO GROWTH 3 DAYS Performed at [facility], [place], [place] [address]    Report Status PENDING  Incomplete  Body fluid culture (includes gram stain)     Status: None   Collection Time: 06/22/19 10:18 AM   Specimen: Pleural Fluid  Result Value Ref Range Status   Specimen Description   Final    PLEURAL Performed at [facility], [address], [place], [place] [address]    Special Requests   Final    NONE Performed at [facility], [place] [place], [place] [address]    Gram Stain   Final    RARE WBC PRESENT,BOTH PMN AND MONONUCLEAR NO ORGANISMS SEEN    Culture   Final    NO GROWTH Performed at [facility], [place] [address], [place], [place] [address]    Report Status 06/25/2019 FINAL  Final  Aerobic/Anaerobic Culture (surgical/deep wound)     Status: None (Preliminary result)   Collection Time: 06/22/19 10:18 AM   Specimen: Pleural Fluid  Result Value Ref Range Status   Specimen Description   Final    PLEURAL Performed at  [facility], [address], [place], [place] [address]    Special Requests   Final    Normal Performed at [facility], [place] [place], [place] [address]    Gram Stain   Final    RARE WBC PRESENT, PREDOMINANTLY MONONUCLEAR NO ORGANISMS SEEN    Culture   Final    NO GROWTH 2 DAYS NO ANAEROBES ISOLATED; CULTURE IN PROGRESS FOR 5 DAYS Performed at [facility], [place] [address], [place], [place] [address]    Report Status PENDING  Incomplete      Radiology Studies: Dg Chest 2 View  Result Date: 06/24/2019 CLINICAL DATA:  Left-sided chest tube 3 days. EXAM: CHEST - 2 VIEW COMPARISON:  06/22/2019 FINDINGS: Left basilar pleural drainage catheter unchanged. Stable left base opacification likely small to moderate effusion with associated basilar atelectasis. Right lung is clear. Cardiomediastinal silhouette and remainder of the exam is unchanged. IMPRESSION: Stable left base opacification likely small to moderate effusion with associated basilar atelectasis. Left basilar chest tube unchanged. Electronically Signed   By: Marin Olp M.D.   On: 06/24/2019 15:33   Ct Chest Wo Contrast  Result Date: 06/24/2019 CLINICAL DATA:  Loculated pleural effusion, status post CT-guided drain placement EXAM: CT CHEST WITHOUT CONTRAST TECHNIQUE: Multidetector CT imaging of the chest was performed following the standard protocol without IV contrast. COMPARISON:  06/21/2019 FINDINGS: Cardiovascular: Three-vessel coronary artery calcifications and/or stents. Mild cardiomegaly. No pericardial effusion. Mediastinum/Nodes: No significant change in numerous enlarged mediastinal and hilar lymph nodes. Thyroid gland, trachea, and esophagus demonstrate no significant findings. Lungs/Pleura: Redemonstrated moderate, loculated left hydropneumothorax and small to moderate right pleural effusion, not significantly changed in volume compared to prior examination. There has been interval  CT-guided placement of a pigtail chest tube about the dependent left lung base, with introduction of a small pneumothorax component. The left-sided pleura are thickened and rinded appearing and there is atelectasis or consolidation of the dependent lungs, left greater than right. There appear to be at least three substantial loculated components of the left-sided effusion, posteriorly, about the lung base, and within the major fissure (series 6, image 145). There is diffuse interlobular septal thickening (series 3, image 30). Upper Abdomen: Trace ascites in the upper abdomen. Musculoskeletal: No chest wall mass or suspicious bone lesions identified. Anasarca. IMPRESSION: 1. Redemonstrated moderate, loculated left hydropneumothorax and  small to moderate right pleural effusion, not significantly changed in volume compared to prior examination. There has been interval CT-guided placement of a pigtail chest tube about the dependent left lung base, with introduction of a small pneumothorax component. 2. The left-sided pleura are thickened and rinded appearing and there is atelectasis or consolidation of the dependent lungs, left greater than right. There appear to be at least three substantial loculated components of the left-sided effusion, posteriorly, about the lung base, and within the major fissure (series 6, image 145). 3. There is diffuse interlobular septal thickening (series 3, image 30), consistent with pulmonary edema. 4. No significant change in numerous enlarged mediastinal and hilar lymph nodes, likely reactive. 5.  Anasarca and ascites in the upper abdomen. 6.  Coronary artery disease. Electronically Signed   By: Eddie Candle M.D.   On: 06/24/2019 12:59     Scheduled Meds: . amLODipine  5 mg Oral Daily  . insulin aspart  0-15 Units Subcutaneous TID WC  . insulin aspart  0-5 Units Subcutaneous QHS  . linezolid  600 mg Oral Q12H  . metoprolol succinate  100 mg Oral Daily   Continuous Infusions:    LOS: 1 day   Time Spent in minutes   45 minutes  Doniel Maiello D.O. on 06/25/2019 at 9:44 AM  Between 7am to 7pm - Please see pager noted on [email or web address]  After 7pm go to [email or web address]  And look for the night coverage person covering for me after hours  [facility] Office  [phone]

## 2019-06-26 ENCOUNTER — Inpatient Hospital Stay (HOSPITAL_COMMUNITY): Payer: Self-pay

## 2019-06-26 ENCOUNTER — Inpatient Hospital Stay (HOSPITAL_COMMUNITY): Payer: Self-pay | Admitting: Certified Registered"

## 2019-06-26 ENCOUNTER — Encounter (HOSPITAL_COMMUNITY)
Admission: AD | Disposition: A | Discharge status: Home / Self Care | Payer: Self-pay | Source: Other Acute Inpatient Hospital | Attending: Internal Medicine

## 2019-06-26 DIAGNOSIS — J869 Pyothorax without fistula: Secondary | ICD-10-CM

## 2019-06-26 HISTORY — PX: VIDEO ASSISTED THORACOSCOPY (VATS)/THOROCOTOMY: SHX6173

## 2019-06-26 HISTORY — PX: VIDEO BRONCHOSCOPY: SHX5072

## 2019-06-26 LAB — BASIC METABOLIC PANEL
Anion gap: 8 (ref 5–15)
Anion gap: 8 (ref 5–15)
BUN: 58 mg/dL — ABNORMAL HIGH (ref 6–20)
BUN: 56 mg/dL — ABNORMAL HIGH (ref 6–20)
CO2: 21 mmol/L — ABNORMAL LOW (ref 22–32)
CO2: 21 mmol/L — ABNORMAL LOW (ref 22–32)
Calcium: 8 mg/dL — ABNORMAL LOW (ref 8.9–10.3)
Calcium: 8.2 mg/dL — ABNORMAL LOW (ref 8.9–10.3)
Chloride: 113 mmol/L — ABNORMAL HIGH (ref 98–111)
Chloride: 114 mmol/L — ABNORMAL HIGH (ref 98–111)
Creatinine, Ser: 1.81 mg/dL — ABNORMAL HIGH (ref 0.61–1.24)
Creatinine, Ser: 1.74 mg/dL — ABNORMAL HIGH (ref 0.61–1.24)
GFR calc Af Amer: 47 mL/min — ABNORMAL LOW (ref >60)
GFR calc Af Amer: 50 mL/min — ABNORMAL LOW (ref >60)
GFR calc non Af Amer: 41 mL/min — ABNORMAL LOW (ref >60)
GFR calc non Af Amer: 43 mL/min — ABNORMAL LOW (ref >60)
Glucose, Bld: 115 mg/dL — ABNORMAL HIGH (ref 70–99)
Glucose, Bld: 126 mg/dL — ABNORMAL HIGH (ref 70–99)
Potassium: 5.3 mmol/L — ABNORMAL HIGH (ref 3.5–5.1)
Potassium: 5.5 mmol/L — ABNORMAL HIGH (ref 3.5–5.1)
Sodium: 142 mmol/L (ref 135–145)
Sodium: 143 mmol/L (ref 135–145)

## 2019-06-26 LAB — POCT I-STAT 7, (LYTES, BLD GAS, ICA,H+H)
Acid-base deficit: 8 mmol/L — ABNORMAL HIGH (ref 0.0–2.0)
Bicarbonate: 17.3 mmol/L — ABNORMAL LOW (ref 20.0–28.0)
Calcium, Ion: 1.05 mmol/L — ABNORMAL LOW (ref 1.15–1.40)
HCT: 21 % — ABNORMAL LOW (ref 39.0–52.0)
Hemoglobin: 7.1 g/dL — ABNORMAL LOW (ref 13.0–17.0)
O2 Saturation: 100 %
Potassium: 4.2 mmol/L (ref 3.5–5.1)
Sodium: 146 mmol/L — ABNORMAL HIGH (ref 135–145)
TCO2: 18 mmol/L — ABNORMAL LOW (ref 22–32)
pCO2 arterial: 32.4 mmHg (ref 32.0–48.0)
pH, Arterial: 7.334 — ABNORMAL LOW (ref 7.350–7.450)
pO2, Arterial: 331 mmHg — ABNORMAL HIGH (ref 83.0–108.0)

## 2019-06-26 LAB — CBC
HCT: 27.9 % — ABNORMAL LOW (ref 39.0–52.0)
Hemoglobin: 8.6 g/dL — ABNORMAL LOW (ref 13.0–17.0)
MCH: 28.8 pg (ref 26.0–34.0)
MCHC: 30.8 g/dL (ref 30.0–36.0)
MCV: 93.3 fL (ref 80.0–100.0)
Platelets: 391 10*3/uL (ref 150–400)
RBC: 2.99 MIL/uL — ABNORMAL LOW (ref 4.22–5.81)
RDW: 13.2 % (ref 11.5–15.5)
WBC: 6.6 10*3/uL (ref 4.0–10.5)
nRBC: 0 % (ref 0.0–0.2)

## 2019-06-26 LAB — URINALYSIS, ROUTINE W REFLEX MICROSCOPIC
Bacteria, UA: NONE SEEN
Bilirubin Urine: NEGATIVE
Glucose, UA: 50 mg/dL — AB
Ketones, ur: NEGATIVE mg/dL
Leukocytes,Ua: NEGATIVE
Nitrite: NEGATIVE
Protein, ur: >=300 mg/dL — AB
Specific Gravity, Urine: 1.019 (ref 1.005–1.030)
pH: 5 (ref 5.0–8.0)

## 2019-06-26 LAB — GLUCOSE, CAPILLARY
Glucose-Capillary: 117 mg/dL — ABNORMAL HIGH (ref 70–99)
Glucose-Capillary: 126 mg/dL — ABNORMAL HIGH (ref 70–99)
Glucose-Capillary: 129 mg/dL — ABNORMAL HIGH (ref 70–99)
Glucose-Capillary: 162 mg/dL — ABNORMAL HIGH (ref 70–99)
Glucose-Capillary: 153 mg/dL — ABNORMAL HIGH (ref 70–99)

## 2019-06-26 LAB — GRAM STAIN

## 2019-06-26 LAB — LIPID PANEL
Cholesterol: 138 mg/dL (ref 0–200)
HDL: 39 mg/dL — ABNORMAL LOW (ref >40)
LDL Cholesterol: 75 mg/dL (ref 0–99)
Total CHOL/HDL Ratio: 3.5 RATIO
Triglycerides: 119 mg/dL (ref <150)
VLDL: 24 mg/dL (ref 0–40)

## 2019-06-26 LAB — CYTOLOGY - NON PAP

## 2019-06-26 LAB — PREPARE RBC (CROSSMATCH)

## 2019-06-26 SURGERY — VIDEO ASSISTED THORACOSCOPY (VATS)/THOROCOTOMY
Anesthesia: General | Site: Chest

## 2019-06-26 MED ORDER — BUPIVACAINE HCL (PF) 0.5 % IJ SOLN
INTRAMUSCULAR | Status: AC
  Filled 2019-06-26: qty 30

## 2019-06-26 MED ORDER — CHLORHEXIDINE GLUCONATE CLOTH 2 % EX PADS
6.0000 | MEDICATED_PAD | Freq: Every day | CUTANEOUS | Status: DC
  Administered 2019-06-26: 6 via TOPICAL

## 2019-06-26 MED ORDER — 0.9 % SODIUM CHLORIDE (POUR BTL) OPTIME
TOPICAL | Status: DC | PRN
  Administered 2019-06-26: 2000 mL

## 2019-06-26 MED ORDER — ALBUMIN HUMAN 5 % IV SOLN
INTRAVENOUS | Status: DC | PRN
  Administered 2019-06-26 (×2): via INTRAVENOUS

## 2019-06-26 MED ORDER — SODIUM CHLORIDE 0.9 % IV SOLN
INTRAVENOUS | Status: DC | PRN
  Administered 2019-06-26: 15:00:00 via INTRAVENOUS

## 2019-06-26 MED ORDER — ARTIFICIAL TEARS OPHTHALMIC OINT
TOPICAL_OINTMENT | OPHTHALMIC | Status: AC
  Filled 2019-06-26: qty 3.5

## 2019-06-26 MED ORDER — MIDAZOLAM HCL 2 MG/2ML IJ SOLN
INTRAMUSCULAR | Status: AC
  Filled 2019-06-26: qty 2

## 2019-06-26 MED ORDER — MIDAZOLAM HCL 2 MG/2ML IJ SOLN
INTRAMUSCULAR | Status: AC
  Administered 2019-06-26: 1 mg via INTRAVENOUS
  Filled 2019-06-26: qty 2

## 2019-06-26 MED ORDER — FENTANYL CITRATE (PF) 100 MCG/2ML IJ SOLN
INTRAMUSCULAR | Status: DC | PRN
  Administered 2019-06-26: 250 ug via INTRAVENOUS

## 2019-06-26 MED ORDER — ACETAMINOPHEN 500 MG PO TABS
1000.0000 mg | ORAL_TABLET | Freq: Four times a day (QID) | ORAL | Status: DC
  Administered 2019-06-27 – 2019-06-29 (×9): 1000 mg via ORAL
  Filled 2019-06-26 (×12): qty 2

## 2019-06-26 MED ORDER — SUGAMMADEX SODIUM 500 MG/5ML IV SOLN
INTRAVENOUS | Status: DC | PRN
  Administered 2019-06-26: 180 mg via INTRAVENOUS

## 2019-06-26 MED ORDER — SODIUM ZIRCONIUM CYCLOSILICATE 5 G PO PACK
5.0000 g | PACK | Freq: Two times a day (BID) | ORAL | Status: DC
  Administered 2019-06-26: 5 g via ORAL
  Filled 2019-06-26 (×2): qty 1

## 2019-06-26 MED ORDER — ONDANSETRON HCL 4 MG/2ML IJ SOLN
INTRAMUSCULAR | Status: AC
  Filled 2019-06-26: qty 2

## 2019-06-26 MED ORDER — TRAMADOL HCL 50 MG PO TABS
50.0000 mg | ORAL_TABLET | Freq: Four times a day (QID) | ORAL | Status: DC | PRN
  Administered 2019-06-27: 50 mg via ORAL
  Filled 2019-06-26: qty 1

## 2019-06-26 MED ORDER — BUPIVACAINE LIPOSOME 1.3 % IJ SUSP
INTRAMUSCULAR | Status: DC | PRN
  Administered 2019-06-26: 50 mL

## 2019-06-26 MED ORDER — MIDAZOLAM HCL 2 MG/2ML IJ SOLN: 0.5000 mg | Freq: Once | INTRAMUSCULAR | Status: DC | PRN

## 2019-06-26 MED ORDER — ONDANSETRON HCL 4 MG/2ML IJ SOLN
4.0000 mg | Freq: Four times a day (QID) | INTRAMUSCULAR | Status: DC | PRN
  Administered 2019-06-28 – 2019-06-30 (×2): 4 mg via INTRAVENOUS
  Filled 2019-06-26 (×2): qty 2

## 2019-06-26 MED ORDER — MEPERIDINE HCL 25 MG/ML IJ SOLN: 6.2500 mg | INTRAMUSCULAR | Status: DC | PRN

## 2019-06-26 MED ORDER — MUPIROCIN 2 % EX OINT
1.0000 "application " | TOPICAL_OINTMENT | Freq: Two times a day (BID) | CUTANEOUS | Status: DC
  Administered 2019-06-26: 1 via NASAL
  Filled 2019-06-26: qty 22

## 2019-06-26 MED ORDER — ROCURONIUM BROMIDE 10 MG/ML (PF) SYRINGE
PREFILLED_SYRINGE | INTRAVENOUS | Status: DC | PRN
  Administered 2019-06-26: 20 mg via INTRAVENOUS
  Administered 2019-06-26: 60 mg via INTRAVENOUS

## 2019-06-26 MED ORDER — SODIUM CHLORIDE 0.9 % IV SOLN
INTRAVENOUS | Status: DC | PRN
  Administered 2019-06-26: 50 ug/min via INTRAVENOUS

## 2019-06-26 MED ORDER — SODIUM CHLORIDE 0.9% IV SOLUTION: Freq: Once | INTRAVENOUS | Status: DC

## 2019-06-26 MED ORDER — PROMETHAZINE HCL 25 MG/ML IJ SOLN
6.2500 mg | INTRAMUSCULAR | Status: DC | PRN
  Administered 2019-06-26: 6.25 mg via INTRAVENOUS

## 2019-06-26 MED ORDER — PROMETHAZINE HCL 25 MG/ML IJ SOLN
INTRAMUSCULAR | Status: AC
  Filled 2019-06-26: qty 1

## 2019-06-26 MED ORDER — DEXAMETHASONE SODIUM PHOSPHATE 10 MG/ML IJ SOLN
INTRAMUSCULAR | Status: AC
  Filled 2019-06-26: qty 1

## 2019-06-26 MED ORDER — SODIUM CHLORIDE 0.9 % IV SOLN: INTRAVENOUS | Status: DC

## 2019-06-26 MED ORDER — SODIUM ZIRCONIUM CYCLOSILICATE 5 G PO PACK
5.0000 g | PACK | Freq: Once | ORAL | Status: DC
  Filled 2019-06-26: qty 1

## 2019-06-26 MED ORDER — FENTANYL CITRATE (PF) 250 MCG/5ML IJ SOLN
INTRAMUSCULAR | Status: AC
  Filled 2019-06-26: qty 5

## 2019-06-26 MED ORDER — SENNOSIDES-DOCUSATE SODIUM 8.6-50 MG PO TABS
1.0000 | ORAL_TABLET | Freq: Every day | ORAL | Status: DC
  Administered 2019-06-27 – 2019-06-29 (×3): 1 via ORAL
  Filled 2019-06-26 (×3): qty 1

## 2019-06-26 MED ORDER — DEXAMETHASONE SODIUM PHOSPHATE 10 MG/ML IJ SOLN
INTRAMUSCULAR | Status: DC | PRN
  Administered 2019-06-26: 5 mg via INTRAVENOUS

## 2019-06-26 MED ORDER — HYDROMORPHONE HCL 1 MG/ML IJ SOLN
INTRAMUSCULAR | Status: AC
  Filled 2019-06-26: qty 1

## 2019-06-26 MED ORDER — LACTATED RINGERS IV SOLN
INTRAVENOUS | Status: DC | PRN
  Administered 2019-06-26: 13:00:00 via INTRAVENOUS

## 2019-06-26 MED ORDER — MORPHINE SULFATE (PF) 2 MG/ML IV SOLN: 2.0000 mg | INTRAVENOUS | Status: DC | PRN

## 2019-06-26 MED ORDER — BISACODYL 5 MG PO TBEC
10.0000 mg | DELAYED_RELEASE_TABLET | Freq: Every day | ORAL | Status: DC
  Filled 2019-06-26 (×3): qty 2

## 2019-06-26 MED ORDER — GLYCOPYRROLATE PF 0.2 MG/ML IJ SOSY
PREFILLED_SYRINGE | INTRAMUSCULAR | Status: AC
  Filled 2019-06-26: qty 2

## 2019-06-26 MED ORDER — PROPOFOL 10 MG/ML IV BOLUS
INTRAVENOUS | Status: DC | PRN
  Administered 2019-06-26: 100 mg via INTRAVENOUS

## 2019-06-26 MED ORDER — INSULIN ASPART 100 UNIT/ML ~~LOC~~ SOLN
0.0000 [IU] | SUBCUTANEOUS | Status: DC
  Administered 2019-06-26 – 2019-06-27 (×2): 2 [IU] via SUBCUTANEOUS

## 2019-06-26 MED ORDER — ROCURONIUM BROMIDE 10 MG/ML (PF) SYRINGE
PREFILLED_SYRINGE | INTRAVENOUS | Status: AC
  Filled 2019-06-26: qty 10

## 2019-06-26 MED ORDER — MIDAZOLAM HCL 2 MG/2ML IJ SOLN
1.0000 mg | Freq: Once | INTRAMUSCULAR | Status: AC
  Administered 2019-06-26: 13:00:00 1 mg via INTRAVENOUS
  Filled 2019-06-26: qty 1

## 2019-06-26 MED ORDER — SODIUM CHLORIDE (PF) 0.9 % IJ SOLN
INTRAMUSCULAR | Status: DC | PRN
  Administered 2019-06-26: 50 mL via INTRAVENOUS

## 2019-06-26 MED ORDER — ACETAMINOPHEN 160 MG/5ML PO SOLN: 1000.0000 mg | Freq: Four times a day (QID) | ORAL | Status: DC

## 2019-06-26 MED ORDER — STERILE WATER FOR IRRIGATION IR SOLN
Status: DC | PRN
  Administered 2019-06-26 (×2): 1000 mL

## 2019-06-26 MED ORDER — HYDROMORPHONE HCL 1 MG/ML IJ SOLN
0.2500 mg | INTRAMUSCULAR | Status: DC | PRN
  Administered 2019-06-26: 0.5 mg via INTRAVENOUS
  Administered 2019-06-26: 0.25 mg via INTRAVENOUS

## 2019-06-26 MED ORDER — FENTANYL CITRATE (PF) 100 MCG/2ML IJ SOLN
INTRAMUSCULAR | Status: AC
  Administered 2019-06-26: 13:00:00 50 ug via INTRAVENOUS
  Filled 2019-06-26: qty 2

## 2019-06-26 MED ORDER — SODIUM CHLORIDE 0.9 % IV SOLN: INTRAVENOUS | Status: DC | PRN

## 2019-06-26 MED ORDER — FENTANYL CITRATE (PF) 100 MCG/2ML IJ SOLN
50.0000 ug | Freq: Once | INTRAMUSCULAR | Status: AC
  Administered 2019-06-26: 13:00:00 50 ug via INTRAVENOUS
  Filled 2019-06-26: qty 1

## 2019-06-26 MED ORDER — ONDANSETRON HCL 4 MG/2ML IJ SOLN
INTRAMUSCULAR | Status: DC | PRN
  Administered 2019-06-26: 4 mg via INTRAVENOUS

## 2019-06-26 MED ORDER — ENOXAPARIN SODIUM 40 MG/0.4ML ~~LOC~~ SOLN
40.0000 mg | Freq: Every day | SUBCUTANEOUS | Status: DC
  Administered 2019-06-27 – 2019-06-29 (×3): 40 mg via SUBCUTANEOUS
  Filled 2019-06-26 (×3): qty 0.4

## 2019-06-26 SURGICAL SUPPLY — 96 items
ADAPTER VALVE BIOPSY EBUS (MISCELLANEOUS) IMPLANT
ADPTR VALVE BIOPSY EBUS (MISCELLANEOUS)
APPLICATOR TIP EXT COSEAL (VASCULAR PRODUCTS) IMPLANT
BLADE CLIPPER SURG (BLADE) ×4 IMPLANT
BRUSH CYTOL CELLEBRITY 1.5X140 (MISCELLANEOUS) IMPLANT
CANISTER SUCT 3000ML PPV (MISCELLANEOUS) ×4 IMPLANT
CATH THORACIC 28FR (CATHETERS) ×2 IMPLANT
CATH THORACIC 36FR (CATHETERS) IMPLANT
CATH THORACIC 36FR RT ANG (CATHETERS) IMPLANT
CLEANER TIP ELECTROSURG 2X2 (MISCELLANEOUS) ×2 IMPLANT
CLIP VESOCCLUDE MED 6/CT (CLIP) ×2 IMPLANT
CONN ST 1/4X3/8  BEN (MISCELLANEOUS)
CONN ST 1/4X3/8 BEN (MISCELLANEOUS) IMPLANT
CONN Y 3/8X3/8X3/8  BEN (MISCELLANEOUS)
CONN Y 3/8X3/8X3/8 BEN (MISCELLANEOUS) IMPLANT
CONT SPEC 4OZ CLIKSEAL STRL BL (MISCELLANEOUS) ×12 IMPLANT
COVER BACK TABLE 60X90IN (DRAPES) ×2 IMPLANT
COVER SURGICAL LIGHT HANDLE (MISCELLANEOUS) ×4 IMPLANT
COVER WAND RF STERILE (DRAPES) ×2 IMPLANT
DEFOGGER SCOPE WARMER CLEARIFY (MISCELLANEOUS) ×2 IMPLANT
DERMABOND ADVANCED (GAUZE/BANDAGES/DRESSINGS) ×2
DERMABOND ADVANCED .7 DNX12 (GAUZE/BANDAGES/DRESSINGS) IMPLANT
DISSECTOR BLUNT TIP ENDO 5MM (MISCELLANEOUS) IMPLANT
DRAIN CHANNEL 28F RND 3/8 FF (WOUND CARE) IMPLANT
DRAPE LAPAROSCOPIC ABDOMINAL (DRAPES) ×2 IMPLANT
ELECT BLADE 4.0 EZ CLEAN MEGAD (MISCELLANEOUS)
ELECT BLADE 6.5 EXT (BLADE) ×2 IMPLANT
ELECT REM PT RETURN 9FT ADLT (ELECTROSURGICAL) ×4
ELECTRODE BLDE 4.0 EZ CLN MEGD (MISCELLANEOUS) ×4 IMPLANT
ELECTRODE REM PT RTRN 9FT ADLT (ELECTROSURGICAL) ×2 IMPLANT
FORCEPS BIOP RJ4 1.8 (CUTTING FORCEPS) IMPLANT
GAUZE SPONGE 4X4 12PLY STRL (GAUZE/BANDAGES/DRESSINGS) ×4 IMPLANT
GLOVE BIO SURGEON STRL SZ 6.5 (GLOVE) ×6 IMPLANT
GLOVE BIO SURGEONS STRL SZ 6.5 (GLOVE) ×2
GOWN STRL REUS W/ TWL LRG LVL3 (GOWN DISPOSABLE) ×6 IMPLANT
GOWN STRL REUS W/TWL LRG LVL3 (GOWN DISPOSABLE) ×6
HEMOSTAT SURGICEL 2X14 (HEMOSTASIS) IMPLANT
KIT BASIN OR (CUSTOM PROCEDURE TRAY) ×4 IMPLANT
KIT CLEAN ENDO COMPLIANCE (KITS) ×2 IMPLANT
KIT TURNOVER KIT B (KITS) ×4 IMPLANT
MARKER SKIN DUAL TIP RULER LAB (MISCELLANEOUS) IMPLANT
NDL SPNL 18GX3.5 QUINCKE PK (NEEDLE) ×2 IMPLANT
NEEDLE 22X1 1/2 (OR ONLY) (NEEDLE) ×2 IMPLANT
NEEDLE SPNL 18GX3.5 QUINCKE PK (NEEDLE) ×4 IMPLANT
NS IRRIG 1000ML POUR BTL (IV SOLUTION) ×12 IMPLANT
OIL SILICONE PENTAX (PARTS (SERVICE/REPAIRS)) ×2 IMPLANT
PACK CHEST (CUSTOM PROCEDURE TRAY) ×4 IMPLANT
PAD ARMBOARD 7.5X6 YLW CONV (MISCELLANEOUS) ×8 IMPLANT
PASSER SUT SWANSON 36MM LOOP (INSTRUMENTS) IMPLANT
SCISSORS LAP 5X35 DISP (ENDOMECHANICALS) IMPLANT
SEALANT PROGEL (MISCELLANEOUS) IMPLANT
SEALANT SURG COSEAL 4ML (VASCULAR PRODUCTS) IMPLANT
SEALANT SURG COSEAL 8ML (VASCULAR PRODUCTS) IMPLANT
SOL ANTI FOG 6CC (MISCELLANEOUS) ×2 IMPLANT
SOLUTION ANTI FOG 6CC (MISCELLANEOUS)
SPONGE INTESTINAL PEANUT (DISPOSABLE) ×4 IMPLANT
STOPCOCK 4 WAY LG BORE MALE ST (IV SETS) ×4 IMPLANT
SUT PROLENE 3 0 SH DA (SUTURE) IMPLANT
SUT PROLENE 4 0 RB 1 (SUTURE)
SUT PROLENE 4-0 RB1 .5 CRCL 36 (SUTURE) IMPLANT
SUT SILK  1 MH (SUTURE) ×4
SUT SILK 1 MH (SUTURE) ×8 IMPLANT
SUT SILK 1 TIES 10X30 (SUTURE) ×2 IMPLANT
SUT SILK 2 0SH CR/8 30 (SUTURE) IMPLANT
SUT VIC AB 1 CTX 18 (SUTURE) IMPLANT
SUT VIC AB 1 CTX 36 (SUTURE)
SUT VIC AB 1 CTX36XBRD ANBCTR (SUTURE) IMPLANT
SUT VIC AB 2-0 CT1 27 (SUTURE) ×4
SUT VIC AB 2-0 CT1 TAPERPNT 27 (SUTURE) IMPLANT
SUT VIC AB 2-0 CTX 36 (SUTURE) IMPLANT
SUT VIC AB 3-0 X1 27 (SUTURE) IMPLANT
SUT VICRYL 0 UR6 27IN ABS (SUTURE) ×2 IMPLANT
SUT VICRYL 2 TP 1 (SUTURE) IMPLANT
SYR 10ML LL (SYRINGE) ×2 IMPLANT
SYR 20ML ECCENTRIC (SYRINGE) ×4 IMPLANT
SYR 30ML LL (SYRINGE) ×2 IMPLANT
SYR 50ML LL SCALE MARK (SYRINGE) ×4 IMPLANT
SYR 5ML LL (SYRINGE) ×2 IMPLANT
SYSTEM SAHARA CHEST DRAIN ATS (WOUND CARE) ×4 IMPLANT
TAPE CLOTH 4X10 WHT NS (GAUZE/BANDAGES/DRESSINGS) ×4 IMPLANT
TAPE CLOTH SURG 4X10 WHT LF (GAUZE/BANDAGES/DRESSINGS) ×2 IMPLANT
TAPE UMBILICAL COTTON 1/8X30 (MISCELLANEOUS) ×4 IMPLANT
TIP APPLICATOR SPRAY EXTEND 16 (VASCULAR PRODUCTS) IMPLANT
TOWEL GREEN STERILE (TOWEL DISPOSABLE) ×4 IMPLANT
TOWEL GREEN STERILE FF (TOWEL DISPOSABLE) ×4 IMPLANT
TRAP SPECIMEN MUCOUS 40CC (MISCELLANEOUS) ×2 IMPLANT
TRAY FOLEY MTR SLVR 16FR STAT (SET/KITS/TRAYS/PACK) ×4 IMPLANT
TROCAR XCEL 12X100 BLDLESS (ENDOMECHANICALS) ×4 IMPLANT
TROCAR XCEL BLADELESS 5X75MML (TROCAR) ×2 IMPLANT
TUBE CONNECTING 20'X1/4 (TUBING) ×1
TUBE CONNECTING 20X1/4 (TUBING) ×3 IMPLANT
TUBING EXTENTION W/L.L. (IV SETS) ×4 IMPLANT
VALVE BIOPSY  SINGLE USE (MISCELLANEOUS)
VALVE BIOPSY SINGLE USE (MISCELLANEOUS) ×2 IMPLANT
VALVE SUCTION BRONCHIO DISP (MISCELLANEOUS) ×2 IMPLANT
WATER STERILE IRR 1000ML POUR (IV SOLUTION) ×6 IMPLANT

## 2019-06-26 NOTE — Anesthesia Procedure Notes (Signed)
Central Venous Catheter Insertion Performed by: Annye Asa, MD, anesthesiologist Start/End10/02/2019 12:14 PM, 06/26/2019 12:30 PM Patient location: Pre-op. Preanesthetic checklist: patient identified, IV checked, risks and benefits discussed, surgical consent, monitors and equipment checked, pre-op evaluation, timeout performed and anesthesia consent Position: supine Lidocaine 1% used for infiltration and patient sedated Hand hygiene performed , maximum sterile barriers used  and Seldinger technique used Catheter size: 8 Fr Central line was placed.Double lumen Procedure performed using ultrasound guided technique. Ultrasound Notes:anatomy identified, needle tip was noted to be adjacent to the nerve/plexus identified, no ultrasound evidence of intravascular and/or intraneural injection and image(s) printed for medical record Attempts: 1 Following insertion, line sutured, dressing applied and Biopatch. Post procedure assessment: no air, free fluid flow and blood return through all ports  Patient tolerated the procedure well with no immediate complications. Additional procedure comments: CVP: Timeout, sterile prep, drape, FBP R neck.  Supine position.  1% lido local, finder and trocar RIJ 1st pass with US guidance.  2 lumen placed over J wire. Biopatch and sterile dressing on.  Patient tolerated well.  VSS.  Jenita Seashore, MD.

## 2019-06-26 NOTE — Consult Note (Signed)
[facility] Renal Consultation Note  Requesting MD: Mikhail Indication for Consultation: CKD  HPI:  Nathan [name] is a 58 y.o. male with past medical history significant for diabetes mellitus, hypertension, atrial fibrillation, status post recent right thumb cellulitis requiring Iand D complicated by a MRSA bacteremia.  He initially was managed at [facility] but was transferred to [facility] on 10/4 for concern of an empyema.  Chest x-ray and chest CT findings thought to be initially fluid versus pneumonia.-Eventually had left Pleurx tube placed on 10/2 which was purulent so was transferred here.  Today underwent a VATS procedure per CTS.  We are consulted for creatinine between 1.6 and 1.8 which is been pretty stable over the last 2 weeks.  There are creatinines in the system from [place] dated 11/22/2018 that was 1.3 and 02/22/2019 that was 1.1.  In December 2019, creatinine was 1.6.  He appears to have been hemodynamically stable throughout his hospitalization, if anything blood pressure is high.  Urine output has not been well recorded.  Urinalysis from yesterday does show proteinuria but no hematuria.  Potassium has been slightly high requiring Lokelma.  Renal ultrasound has been ordered but not obtained yet.  I do not see any nephrotoxic meds in my review of home meds or meds earlier in the hospitalization  Creatinine, Ser  Date/Time Value Ref Range Status  06/26/2019 10:50 AM 1.74 (H) 0.61 - 1.24 mg/dL Final  06/26/2019 04:09 AM 1.81 (H) 0.61 - 1.24 mg/dL Final  06/25/2019 06:10 PM 1.86 (H) 0.61 - 1.24 mg/dL Final  06/25/2019 11:18 AM 1.80 (H) 0.61 - 1.24 mg/dL Final  06/25/2019 04:30 AM 1.87 (H) 0.61 - 1.24 mg/dL Final  06/23/2019 05:27 AM 1.65 (H) 0.61 - 1.24 mg/dL Final  06/22/2019 06:04 AM 1.73 (H) 0.61 - 1.24 mg/dL Final  06/21/2019 05:52 AM 1.65 (H) 0.61 - 1.24 mg/dL Final  06/20/2019 04:07 AM 1.48 (H) 0.61 - 1.24 mg/dL Final  06/19/2019 01:49 PM 1.60 (H) 0.61 - 1.24  mg/dL Final  06/15/2019 05:09 AM 1.52 (H) 0.61 - 1.24 mg/dL Final  06/14/2019 04:59 AM 1.84 (H) 0.61 - 1.24 mg/dL Final  06/13/2019 04:00 AM 1.94 (H) 0.61 - 1.24 mg/dL Final  06/12/2019 04:14 AM 1.74 (H) 0.61 - 1.24 mg/dL Final  06/11/2019 04:20 AM 1.46 (H) 0.61 - 1.24 mg/dL Final  06/10/2019 02:51 PM 1.68 (H) 0.61 - 1.24 mg/dL Final     PMHx:   Past Medical History:  Diagnosis Date  . Atrial fibrillation ([place])   . Diabetes mellitus without complication ([place])   . Hypertension     Past Surgical History:  Procedure Laterality Date  . I&D EXTREMITY Right 06/11/2019   Procedure: IRRIGATION AND DEBRIDEMENT RIGHT THUMB;  Surgeon: Dereck Leep, MD;  Location: [facility] ORS;  Service: Orthopedics;  Laterality: Right;  . TEE WITHOUT CARDIOVERSION N/A 06/14/2019   Procedure: TRANSESOPHAGEAL ECHOCARDIOGRAM (TEE);  Surgeon: Minna Merritts, MD;  Location: [facility] ORS;  Service: Cardiovascular;  Laterality: N/A;    Family Hx:  Family History  Problem Relation Age of Onset  . CAD Father   . Diabetes Sister   . Diabetes Brother   . Diabetes Sister   . Diabetes Sister     Social History:  reports that he has never smoked. He has never used smokeless tobacco. He reports that he does not drink alcohol or use drugs.  Allergies: No Known Allergies  Medications: Prior to Admission medications   Medication Sig Start Date End Date Taking? Authorizing  Provider  amLODipine (NORVASC) 5 MG tablet Take 1 tablet (5 mg total) by mouth daily. 06/25/19  Yes Demetrios Loll, MD  apixaban (ELIQUIS) 5 MG TABS tablet Take 1 tablet (5 mg total) by mouth 2 (two) times daily. 06/16/19  Yes Epifanio Lesches, MD  insulin glargine (LANTUS) 100 UNIT/ML injection Inject 0.1 mLs (10 Units total) into the skin daily. 06/16/19  Yes Epifanio Lesches, MD  linezolid (ZYVOX) 600 MG tablet Take 1 tablet (600 mg total) by mouth every 12 (twelve) hours. 06/16/19  Yes Epifanio Lesches, MD  metoprolol succinate (TOPROL-XL)  100 MG 24 hr tablet Take 1 tablet (100 mg total) by mouth daily. Take with or immediately following a meal. 06/16/19  Yes Epifanio Lesches, MD  oxyCODONE-acetaminophen (PERCOCET) 5-325 MG tablet Take 1 tablet by mouth every 6 (six) hours as needed for moderate pain or severe pain. 06/16/19 06/15/20 Yes Epifanio Lesches, MD  mupirocin ointment (BACTROBAN) 2 % Place 1 application into the nose 2 (two) times daily. 06/16/19   Epifanio Lesches, MD    I have reviewed the patient's current medications.  Labs:  Results for orders placed or performed during the hospital encounter of 06/24/19 (from the past 48 hour(s))  Glucose, capillary     Status: Abnormal   Collection Time: 06/24/19  6:19 PM  Result Value Ref Range   Glucose-Capillary 173 (H) 70 - 99 mg/dL  Glucose, capillary     Status: Abnormal   Collection Time: 06/24/19  9:05 PM  Result Value Ref Range   Glucose-Capillary 171 (H) 70 - 99 mg/dL  Basic metabolic panel     Status: Abnormal   Collection Time: 06/25/19  4:30 AM  Result Value Ref Range   Sodium 142 135 - 145 mmol/L   Potassium 5.7 (H) 3.5 - 5.1 mmol/L   Chloride 114 (H) 98 - 111 mmol/L   CO2 22 22 - 32 mmol/L   Glucose, Bld 170 (H) 70 - 99 mg/dL   BUN 64 (H) 6 - 20 mg/dL   Creatinine, Ser 1.87 (H) 0.61 - 1.24 mg/dL   Calcium 7.8 (L) 8.9 - 10.3 mg/dL   GFR calc non Af Amer 39 (L) >60 mL/min   GFR calc Af Amer 46 (L) >60 mL/min   Anion gap 6 5 - 15    Comment: Performed at [facility], [address], [place], [place] [address]  CBC     Status: Abnormal   Collection Time: 06/25/19  4:30 AM  Result Value Ref Range   WBC 7.0 4.0 - 10.5 K/uL   RBC 2.71 (L) 4.22 - 5.81 MIL/uL   Hemoglobin 8.1 (L) 13.0 - 17.0 g/dL   HCT 25.0 (L) 39.0 - 52.0 %   MCV 92.3 80.0 - 100.0 fL   MCH 29.9 26.0 - 34.0 pg   MCHC 32.4 30.0 - 36.0 g/dL   RDW 13.2 11.5 - 15.5 %   Platelets 404 (H) 150 - 400 K/uL   nRBC 0.0 0.0 - 0.2 %    Comment: Performed at [facility],  [place] [address] [place], [place] [address]  Protime-INR     Status: Abnormal   Collection Time: 06/25/19  4:30 AM  Result Value Ref Range   Prothrombin Time 16.6 (H) 11.4 - 15.2 seconds   INR 1.4 (H) 0.8 - 1.2    Comment: (NOTE) INR goal varies based on device and disease states. Performed at [facility], [place] [address], [place], [place] [address]  APTT     Status: None   Collection Time: 06/25/19  4:30 AM  Result Value Ref Range   aPTT 31 24 - 36 seconds    Comment: Performed at [facility] [address], [place], [place] [address]  Magnesium     Status: Abnormal   Collection Time: 06/25/19  4:30 AM  Result Value Ref Range   Magnesium 2.5 (H) 1.7 - 2.4 mg/dL    Comment: Performed at [facility] [address] [place], [place] [address]  Phosphorus     Status: None   Collection Time: 06/25/19  4:30 AM  Result Value Ref Range   Phosphorus 4.3 2.5 - 4.6 mg/dL    Comment: Performed at [facility] [address], [place], [place] [address]  Type and screen [place]     Status: None (Preliminary result)   Collection Time: 06/25/19  4:40 AM  Result Value Ref Range   ABO/RH(D) B POS    Antibody Screen NEG    Sample Expiration 06/28/2019,2359    Unit Number [id number]    Blood Component Type RED CELLS,LR    Unit division 00    Status of Unit ISSUED    Transfusion Status OK TO TRANSFUSE    Crossmatch Result      Compatible Performed at [facility], [place] [address] [place], [place] [address]   ABO/Rh     Status: None   Collection Time: 06/25/19  4:40 AM  Result Value Ref Range   ABO/RH(D)      B POS Performed at [facility] [address], [place], [place] [address]   Glucose, capillary     Status: Abnormal   Collection Time: 06/25/19  8:12 AM  Result Value Ref Range   Glucose-Capillary 168 (H) 70 - 99 mg/dL  Surgical pcr screen     Status: Abnormal   Collection Time: 06/25/19  8:39 AM   Specimen: Nasal  Mucosa; Nasal Swab  Result Value Ref Range   MRSA, PCR NEGATIVE NEGATIVE   Staphylococcus aureus POSITIVE (A) NEGATIVE    Comment: (NOTE) The Xpert SA Assay (FDA approved for NASAL specimens in patients 82 years of age and older), is one component of a comprehensive surveillance program. It is not intended to diagnose infection nor to guide or monitor treatment. Performed at [facility], [place] [address] [place], [place] [id number]   Basic metabolic panel     Status: Abnormal   Collection Time: 06/25/19 11:18 AM  Result Value Ref Range   Sodium 144 135 - 145 mmol/L   Potassium 5.5 (H) 3.5 - 5.1 mmol/L   Chloride 115 (H) 98 - 111 mmol/L   CO2 21 (L) 22 - 32 mmol/L   Glucose, Bld 157 (H) 70 - 99 mg/dL   BUN 61 (H) 6 - 20 mg/dL   Creatinine, Ser 1.80 (H) 0.61 - 1.24 mg/dL   Calcium 8.0 (L) 8.9 - 10.3 mg/dL   GFR calc non Af Amer 41 (L) >60 mL/min   GFR calc Af Amer 48 (L) >60 mL/min   Anion gap 8 5 - 15    Comment: Performed at [facility] [address], [place], [place] [address]  Glucose, capillary     Status: Abnormal   Collection Time: 06/25/19 11:44 AM  Result Value Ref Range   Glucose-Capillary 145 (H) 70 - 99 mg/dL  Glucose, capillary     Status:  Abnormal   Collection Time: 06/25/19  5:49 PM  Result Value Ref Range   Glucose-Capillary 160 (H) 70 - 99 mg/dL  Blood gas, arterial on room air     Status: Abnormal   Collection Time: 06/25/19  6:00 PM  Result Value Ref Range   FIO2 0.21    pH, Arterial 7.375 7.350 - 7.450   pCO2 arterial 36.2 32.0 - 48.0 mmHg   pO2, Arterial 81.0 (L) 83.0 - 108.0 mmHg   Bicarbonate 20.7 20.0 - 28.0 mmol/L   Acid-base deficit 3.7 (H) 0.0 - 2.0 mmol/L   O2 Saturation 95.9 %   Patient temperature 98.6    Collection site RIGHT RADIAL    Sample type ARTERIAL DRAW    Allens test (pass/fail) PASS PASS  CBC     Status: Abnormal   Collection Time: 06/25/19  6:10 PM  Result Value Ref Range   WBC 8.2 4.0 - 10.5 K/uL   RBC 3.09  (L) 4.22 - 5.81 MIL/uL   Hemoglobin 9.3 (L) 13.0 - 17.0 g/dL   HCT 28.7 (L) 39.0 - 52.0 %   MCV 92.9 80.0 - 100.0 fL   MCH 30.1 26.0 - 34.0 pg   MCHC 32.4 30.0 - 36.0 g/dL   RDW 13.2 11.5 - 15.5 %   Platelets 389 150 - 400 K/uL   nRBC 0.0 0.0 - 0.2 %    Comment: Performed at [facility], [address], [place], [place] [address]  Comprehensive metabolic panel     Status: Abnormal   Collection Time: 06/25/19  6:10 PM  Result Value Ref Range   Sodium 141 135 - 145 mmol/L   Potassium 5.3 (H) 3.5 - 5.1 mmol/L   Chloride 115 (H) 98 - 111 mmol/L   CO2 21 (L) 22 - 32 mmol/L   Glucose, Bld 191 (H) 70 - 99 mg/dL   BUN 60 (H) 6 - 20 mg/dL   Creatinine, Ser 1.86 (H) 0.61 - 1.24 mg/dL   Calcium 7.8 (L) 8.9 - 10.3 mg/dL   Total Protein 5.5 (L) 6.5 - 8.1 g/dL   Albumin 2.2 (L) 3.5 - 5.0 g/dL   AST 21 15 - 41 U/L   ALT 20 0 - 44 U/L   Alkaline Phosphatase 253 (H) 38 - 126 U/L   Total Bilirubin 0.8 0.3 - 1.2 mg/dL   GFR calc non Af Amer 40 (L) >60 mL/min   GFR calc Af Amer 46 (L) >60 mL/min   Anion gap 5 5 - 15    Comment: Performed at [facility], [place] [address] [place], [place] [address]  Protime-INR     Status: Abnormal   Collection Time: 06/25/19  6:10 PM  Result Value Ref Range   Prothrombin Time 16.5 (H) 11.4 - 15.2 seconds   INR 1.4 (H) 0.8 - 1.2    Comment: (NOTE) INR goal varies based on device and disease states. Performed at [facility], [place] [address], [place], [place] [address]   APTT     Status: None   Collection Time: 06/25/19  6:10 PM  Result Value Ref Range   aPTT 32 24 - 36 seconds    Comment: Performed at [facility] [address], [place], [place] [address]  Glucose, capillary     Status: Abnormal   Collection Time: 06/25/19  9:20 PM  Result Value Ref Range   Glucose-Capillary 132 (H) 70 - 99 mg/dL  Urinalysis, Routine w reflex microscopic  Status: Abnormal   Collection Time: 06/25/19 11:59 PM  Result Value Ref Range   Color,  Urine YELLOW YELLOW   APPearance HAZY (A) CLEAR   Specific Gravity, Urine 1.019 1.005 - 1.030   pH 5.0 5.0 - 8.0   Glucose, UA 50 (A) NEGATIVE mg/dL   Hgb urine dipstick SMALL (A) NEGATIVE   Bilirubin Urine NEGATIVE NEGATIVE   Ketones, ur NEGATIVE NEGATIVE mg/dL   Protein, ur >=300 (A) NEGATIVE mg/dL   Nitrite NEGATIVE NEGATIVE   Leukocytes,Ua NEGATIVE NEGATIVE   RBC / HPF 0-5 0 - 5 RBC/hpf   WBC, UA 6-10 0 - 5 WBC/hpf   Bacteria, UA NONE SEEN NONE SEEN   Mucus PRESENT    Hyaline Casts, UA PRESENT    Uric Acid Crys, UA PRESENT     Comment: Performed at [facility] [address], [place], [place] [id number]  Basic metabolic panel     Status: Abnormal   Collection Time: 06/26/19  4:09 AM  Result Value Ref Range   Sodium 142 135 - 145 mmol/L   Potassium 5.3 (H) 3.5 - 5.1 mmol/L   Chloride 113 (H) 98 - 111 mmol/L   CO2 21 (L) 22 - 32 mmol/L   Glucose, Bld 115 (H) 70 - 99 mg/dL   BUN 58 (H) 6 - 20 mg/dL   Creatinine, Ser 1.81 (H) 0.61 - 1.24 mg/dL   Calcium 8.0 (L) 8.9 - 10.3 mg/dL   GFR calc non Af Amer 41 (L) >60 mL/min   GFR calc Af Amer 47 (L) >60 mL/min   Anion gap 8 5 - 15    Comment: Performed at [facility] [address], [place], [place] [address]  CBC     Status: Abnormal   Collection Time: 06/26/19  4:09 AM  Result Value Ref Range   WBC 6.6 4.0 - 10.5 K/uL   RBC 2.99 (L) 4.22 - 5.81 MIL/uL   Hemoglobin 8.6 (L) 13.0 - 17.0 g/dL   HCT 27.9 (L) 39.0 - 52.0 %   MCV 93.3 80.0 - 100.0 fL   MCH 28.8 26.0 - 34.0 pg   MCHC 30.8 30.0 - 36.0 g/dL   RDW 13.2 11.5 - 15.5 %   Platelets 391 150 - 400 K/uL   nRBC 0.0 0.0 - 0.2 %    Comment: Performed at [facility], [place] [address] [place], [place] [address]  Lipid panel     Status: Abnormal   Collection Time: 06/26/19  4:09 AM  Result Value Ref Range   Cholesterol 138 0 - 200 mg/dL   Triglycerides 119 <150 mg/dL   HDL 39 (L) >40 mg/dL   Total CHOL/HDL Ratio 3.5 RATIO   VLDL 24 0 - 40 mg/dL   LDL  Cholesterol 75 0 - 99 mg/dL    Comment:        Total Cholesterol/HDL:CHD Risk Coronary Heart Disease Risk Table                     Men   Women  1/2 Average Risk   3.4   3.3  Average Risk       5.0   4.4  2 X Average Risk   9.6   7.1  3 X Average Risk  23.4   11.0        Use the calculated Patient Ratio above and the CHD Risk Table to determine the patient's CHD Risk.  ATP III CLASSIFICATION (LDL):  <100     mg/dL   Optimal  100-129  mg/dL   Near or Above                    Optimal  130-159  mg/dL   Borderline  160-189  mg/dL   High  >190     mg/dL   Very High Performed at [facility] [address] [place], [place] [address]   Glucose, capillary     Status: Abnormal   Collection Time: 06/26/19  4:38 AM  Result Value Ref Range   Glucose-Capillary 117 (H) 70 - 99 mg/dL  Glucose, capillary     Status: Abnormal   Collection Time: 06/26/19  8:17 AM  Result Value Ref Range   Glucose-Capillary 126 (H) 70 - 99 mg/dL  Basic metabolic panel     Status: Abnormal   Collection Time: 06/26/19 10:50 AM  Result Value Ref Range   Sodium 143 135 - 145 mmol/L   Potassium 5.5 (H) 3.5 - 5.1 mmol/L   Chloride 114 (H) 98 - 111 mmol/L   CO2 21 (L) 22 - 32 mmol/L   Glucose, Bld 126 (H) 70 - 99 mg/dL   BUN 56 (H) 6 - 20 mg/dL   Creatinine, Ser 1.74 (H) 0.61 - 1.24 mg/dL   Calcium 8.2 (L) 8.9 - 10.3 mg/dL   GFR calc non Af Amer 43 (L) >60 mL/min   GFR calc Af Amer 50 (L) >60 mL/min   Anion gap 8 5 - 15    Comment: Performed at [facility] [address], [place], [place] [address]  Glucose, capillary     Status: Abnormal   Collection Time: 06/26/19 10:59 AM  Result Value Ref Range   Glucose-Capillary 129 (H) 70 - 99 mg/dL  I-STAT 7, (LYTES, BLD GAS, ICA, H+H)     Status: Abnormal   Collection Time: 06/26/19  1:59 PM  Result Value Ref Range   pH, Arterial 7.334 (L) 7.350 - 7.450   pCO2 arterial 32.4 32.0 - 48.0 mmHg   pO2, Arterial 331.0 (H) 83.0 - 108.0 mmHg    Bicarbonate 17.3 (L) 20.0 - 28.0 mmol/L   TCO2 18 (L) 22 - 32 mmol/L   O2 Saturation 100.0 %   Acid-base deficit 8.0 (H) 0.0 - 2.0 mmol/L   Sodium 146 (H) 135 - 145 mmol/L   Potassium 4.2 3.5 - 5.1 mmol/L   Calcium, Ion 1.05 (L) 1.15 - 1.40 mmol/L   HCT 21.0 (L) 39.0 - 52.0 %   Hemoglobin 7.1 (L) 13.0 - 17.0 g/dL   Patient temperature HIDE    Sample type ARTERIAL   Gram stain     Status: None   Collection Time: 06/26/19  2:06 PM   Specimen: PATH Cytology Pleural fluid; Body Fluid  Result Value Ref Range   Specimen Description FLUID PLEURAL    Special Requests NONE    Gram Stain      RARE WBC PRESENT, PREDOMINANTLY MONONUCLEAR NO ORGANISMS SEEN Performed at [facility], [place] [address], [place], [place] [address]    Report Status 06/26/2019 FINAL   Prepare RBC     Status: None   Collection Time: 06/26/19  3:00 PM  Result Value Ref Range   Order Confirmation      ORDER PROCESSED BY BLOOD BANK Performed at [facility], [place] [address] [place], [place] [address]   Glucose, capillary  Status: Abnormal   Collection Time: 06/26/19  3:52 PM  Result Value Ref Range   Glucose-Capillary 162 (H) 70 - 99 mg/dL     ROS:  Review of systems not obtained due to patient factors.  Physical Exam: Vitals:   06/26/19 1700 06/26/19 1711  BP:  (!) 141/72  Pulse: (!) 52 (!) 53  Resp: 12 11  Temp:    SpO2: 100% 98%     General: somnolent after surgery in PACU HEENT: Pupils equally round and reactive Neck: No significant JVD Heart: Bradycardic, irregular Lungs: Status post VATS-with chest tube on the left Abdomen: Soft, nontender, nondistended Extremities: Dependent 1+ edema Skin: Warm and dry Neuro: Sedated after surgery  Assessment/Plan: 58 year old male with complicated past medical history including diabetes, hypertension-both of which are under poor control.  Likely has some baseline CKD-even though creatinine in June appeared normal it is an outlier.  Over the  last 2 weeks he has had a prolonged hospitalization with infectious complications.  Creatinine is possibly above baseline but has been stable and indicates a GFR in the 40s 1.Renal-possibly some baseline CKD with creatinine in December 2019 1.6.  True that there is a creatinine in the system of 1.1 in June however that is the outlier.  Since hospitalized with infectious complications including a MRSA bacteremia-patient has had worsening of his CKD, however creatinine still fairly stable in the 1.6-1.8 range indicating a GFR in the 40s.  I would suspect any change that we see is a result of his recent infectious medical issues.  He does not have hematuria so I am not thinking of any kind of a glomerulonephritis.  I agree with the renal ultrasound.  I will quantify his proteinuria.  However, it does not seem any more significant work-up is needed at this time.  The inpatient renal consultation service is quite busy at the moment, I will follow-up on his renal ultrasound and quantification of protein remotely and overall I would suggest an outpatient evaluation after hospitalization. 2. Hypertension/volume  -if anything blood pressure is slightly high.  Not ideal to fine-tune things in an inpatient setting.  Would continue the Toprol.  Likely would be a candidate for ACE or ARB but would not start it until outpatient setting.  He does appear to be slightly volume overloaded but also with a low albumin so is likely third spacing.  I do not suspect that he needs major diuresis at this point but should respond to oral Lasix if needed-would do something like 40 mg daily 3.  Hyperkalemia-mild and handled by the [facility].  I would continue to use that as needed 4. Anemia  -probably mostly due to hospitalization but could have some anemia of CKD.  Will check iron stores and treat as needed  I am going to follow this patient at a distance.  Please call me for any questions that you have.  I will manage the anemia if iron  is needed.  Otherwise, if renal function does not significantly change I will arrange an outpatient nephrology follow-up for him   Louis Meckel 06/26/2019, 5:28 PM  [phone]

## 2019-06-26 NOTE — Progress Notes (Signed)
     [address]       [place] [address]             [phone]       No events.  Denies any pain, or SOB  Vitals:   06/25/19 2122 06/26/19 0455  BP: (!) 152/83 (!) 154/83  Pulse: 69 62  Resp: 17 17  Temp: 98.4 F (36.9 C) 98.2 F (36.8 C)  SpO2: 96% 97%    Alert NAD Regular EWOB CT in place  58 yo male with left hydropneumothorax - OR today for bronchoscopy, and Left VATS, decortication.  Nathan [name] Nathan [name]

## 2019-06-26 NOTE — Anesthesia Postprocedure Evaluation (Signed)
Anesthesia Post Note  Patient: Nathan [name]  Procedure(s) Performed: VIDEO ASSISTED THORACOSCOPY DECORTICATION (Left Chest) VIDEO BRONCHOSCOPY (N/A )     Patient location during evaluation: PACU Anesthesia Type: General Level of consciousness: sedated, patient cooperative and oriented Pain management: pain level controlled Vital Signs Assessment: post-procedure vital signs reviewed and stable Respiratory status: spontaneous breathing, nonlabored ventilation, respiratory function stable and patient connected to nasal cannula oxygen Cardiovascular status: blood pressure returned to baseline and stable Postop Assessment: no apparent nausea or vomiting Anesthetic complications: no    Last Vitals:  Vitals:   06/26/19 1700 06/26/19 1711  BP:  (!) 141/72  Pulse: (!) 52 (!) 53  Resp: 12 11  Temp:    SpO2: 100% 98%    Last Pain:  Vitals:   06/26/19 1545  TempSrc:   PainSc: Asleep                 Janell Keeling,E. Elesa Garman

## 2019-06-26 NOTE — Brief Op Note (Signed)
06/26/2019  3:36 PM  PATIENT:  Christia Reading A Paulino  58 y.o. male  PRE-OPERATIVE DIAGNOSIS:  Empyema  POST-OPERATIVE DIAGNOSIS:  Empyema  PROCEDURE:  Procedure(s): VIDEO ASSISTED THORACOSCOPY DECORTICATION (Left) VIDEO BRONCHOSCOPY (N/A)  SURGEON:  Surgeon(s) and Role:    * Lightfoot, Lucile Crater, MD - Primary  PHYSICIAN ASSISTANT:  Nicholes Rough, PA-C   ANESTHESIA:   general  EBL:  150 mL   BLOOD ADMINISTERED:none  DRAINS: one straight chest tube   LOCAL MEDICATIONS USED:  NONE  SPECIMEN:  Source of Specimen:  pleural peel  DISPOSITION OF SPECIMEN:  PATHOLOGY  COUNTS:  YES  DICTATION: .Dragon Dictation  PLAN OF CARE: Admit to inpatient   PATIENT DISPOSITION:  PACU - hemodynamically stable.   Delay start of Pharmacological VTE agent (>24hrs) due to surgical blood loss or risk of bleeding: yes

## 2019-06-26 NOTE — Transfer of Care (Signed)
Immediate Anesthesia Transfer of Care Note  Patient: Nathan [name]  Procedure(s) Performed: VIDEO ASSISTED THORACOSCOPY DECORTICATION (Left Chest) VIDEO BRONCHOSCOPY (N/A )  Patient Location: PACU  Anesthesia Type:General  Level of Consciousness: awake, alert , oriented and patient cooperative  Airway & Oxygen Therapy: Patient Spontanous Breathing and Patient connected to face mask oxygen  Post-op Assessment: Report given to RN and Post -op Vital signs reviewed and stable  Post vital signs: Reviewed and stable  Last Vitals:  Vitals Value Taken Time  BP 120/67 06/26/19 1542  Temp    Pulse 47 06/26/19 1548  Resp 10 06/26/19 1548  SpO2 90 % 06/26/19 1548  Vitals shown include unvalidated device data.  Last Pain:  Vitals:   06/26/19 1245  TempSrc:   PainSc: 0-No pain         Complications: No apparent anesthesia complications

## 2019-06-26 NOTE — Progress Notes (Signed)
PROGRESS NOTE    Nathan [name]  [id number] DOB: August 18, 1961 DOA: 06/24/2019 PCP: Derinda Late, MD   Brief Narrative:  HPI on 06/24/2019 by Dr. Sterling Big Sherrer is a 58 y.o. male with medical history significant of insulin-dependent diabetes mellitus, hypertension, A. Fib-on Eliquis (Non compliant due to affordability issues), anemia of chronic disease, chronic kidney disease stage IIIb, right thumb cellulitis status post IND and MRSA bacteremia-on Zyvox is a direct transfer from [facility] for the concern of left hydropneumothorax.  Patient admitted on 06/19/19 due to fluid overload/anasarca-his x-ray showed infiltrates and BNP was elevated.  Patient started on antibiotics for the concern of healthcare associated pneumonia-Vanco and Rocephin was started initially which was changed to Zyvox as per ID recommendation.  Patient was given IV Lasix for the concern of acute on chronic CHF.  COVID-19 was negative.  Patient remained afebrile, no leukocytosis.  CT scan chest showed collection on left could be empyema with right upper lobe possible infiltrate.  Patient had left chest catheter placement by IR on 06/22/2019.  Labs and cultures were sent.  Per surgery Dr. Christie Nottingham real change on chest x-ray after placement of pigtail.  Dr. Dahlia Byes discussed with Dr. Debby Freiberg surgery who recommended transfer to [facility] for possible VATS.  Repeated CT chest from 06/24/2019 shows moderate loculated left hydropneumothorax and small to moderate right pleural effusion not significantly changed in volume compared to prior examination.  Upon my evaluation: Patient sitting comfortably on the bed.  Denies cough, shortness of breath, wheezing, respiratory distress, fever, chills, chest pain, palpitation, headache, blurry vision, epigastric pain, nausea or vomiting.  Reports persistent bilateral lower leg swelling, left arm swelling.  Patient tells me that he is frustrated  regarding fluid overload and prolonged hospital stay as he have to take care of his wife at Saturday because status post.  I talked to Dr. Debby Freiberg surgery regarding the patient.  We will hold anticoagulation and will keep him n.p.o. after midnight for possible VATS later tomorrow.  Interim history Patient sent here from [facility] for VATS. Currently with possible AKI vs CKD and hyperkalemia.  Assessment & Plan   Left-sided hydropneumothorax/complex left-sided pleural effusion/empyema -Noted on CT.  Patient was transferred from [facility] to [facility] for VATS procedure by cardiothoracic surgeon. -Status post pigtail catheter placement by IR 06/22/2019 at [facility].  Repeat chest x-ray showed no changes. -As already noted, cardiothoracic surgery consulted and appreciated for VATS procedure, hopefully will occur today  Acute systolic heart failure -Patient was noted to have lower extremity edema as well as an elevated BNP of 3281 on 06/19/2019 -Echocardiogram showed EF 40-45%; TEE showed EF 50-55% -Patient was on IV Lasix at [facility]- however currently held -Continue to monitor intake and output, daily weights -Cardiology consulted and appreciated  Hyperkalemia -Potassium was up to 5.7; 2 doses of lokelma were given -Today potassium mildly improved to 5.3- will give additional 10mg  dose of lokelma and repeat BMP   History of right-hand cellulitis with MRSA bacteremia/MRSA abscess of the right thumb -Infectious disease was following patient at [facility] -Blood cultures from [facility] 06/10/2019 +MRSA, wound culture 9/21 +MRSA -Subsequent cultures have been negative -He was placed on Zyvox 600 mg twice daily, with his last dosage on 06/29/2019 -TEE was negative during previous hospitalization  Atrial fibrillation, paroxysmal -Patient was on Eliquis, this was started at [facility] -Currently he is in sinus rhythm, rate and rhythm controlled -CHADSVASC 3 (given HTN, CHF,  DM) -Continue  Lopressor  Essential hypertension -BP stable, continue metoprolol, amlodipine -Cardiology recommending discontinuing amlodipine and starting ACEi or ARB when renal function allows   Diabetes mellitus, type II -Continue insulin sliding scale and CGB monitoring  -Last hemoglobin A1c 7.2 on 06/13/2019 (however was up to 11.2 in Dec 2019)  Chronic kidney disease, stage III vs possible AKI -Baseline creatinine approximately 1.4-1.6 as review of patient's chart while at [facility] -Further review shows creatinine 1.25 Aug 2018, however improvement in March and June 2020 -Currently creatinine 1.81 -Possibly multifactorial- recent lasix use, infection, etc -suspect some component of CKD given history of hypertension and uncontrolled diabetes  -Have ordered renal US, complement 3/4, urine/creatinine ratio (given proteinuria), urine culture  -Nephrology consulted and appreciated  Anemia of chronic disease -hemoglobin currently 8.6 -Transfuse if creatinine drops below <7 -Continue to monitor CBC  Left upper extremity edema -Will order venous doppler  DVT Prophylaxis  SCDs  Code Status: Full  Family Communication: None at bedside  Disposition Plan: Admitted. Pending VATS procedure. Dispo TBD  Consultants Cardiothoracic surgery Cardiology Radiology  Procedures  None  Antibiotics   Anti-infectives (From admission, onward)   Start     Dose/Rate Route Frequency Ordered Stop   06/24/19 2200  linezolid (ZYVOX) tablet 600 mg     600 mg Oral Every 12 hours 06/24/19 1927        Subjective:   Nathan [name] seen and examined today.  No complaints today. Denies current chest pain, shortness of breath, abdominal pain, N/V/D/C.  Objective:   Vitals:   06/25/19 1533 06/25/19 2122 06/26/19 0455 06/26/19 0500  BP: (!) 160/85 (!) 152/83 (!) 154/83   Pulse: 66 69 62   Resp: 14 17 17    Temp: 98.1 F (36.7 C) 98.4 F (36.9 C) 98.2 F (36.8 C)   TempSrc: Oral Oral Oral    SpO2: 98% 96% 97%   Weight:    77.2 kg    Intake/Output Summary (Last 24 hours) at 06/26/2019 0911 Last data filed at 06/26/2019 0534 Gross per 24 hour  Intake 0 ml  Output 560 ml  Net -560 ml   Filed Weights   06/26/19 0500  Weight: 77.2 kg   Exam  General: Well developed, well nourished, NAD, appears stated age  HEENT: NCAT, mucous membranes moist.   Cardiovascular: S1 S2 auscultated, RRR, no murmur  Respiratory:Diminished breath sounds on L, otherwise CTA. L Chest tube noted  Abdomen: Soft, nontender, nondistended, + bowel sounds  Extremities: warm dry without cyanosis clubbing. +LE edema. LUE Edema.   Neuro: AAOx3, nonfocal  Psych: Appropriate mood and affect  Data Reviewed: I have personally reviewed following labs and imaging studies  CBC: Recent Labs  Lab 06/22/19 0604 06/24/19 0707 06/25/19 0430 06/25/19 1810 06/26/19 0409  WBC 6.6 6.7 7.0 8.2 6.6  NEUTROABS  --  5.1  --   --   --   HGB 8.0* 9.0* 8.1* 9.3* 8.6*  HCT 25.6* 28.3* 25.0* 28.7* 27.9*  MCV 92.4 92.2 92.3 92.9 93.3  PLT 496* 455* 404* 389 0000000   Basic Metabolic Panel: Recent Labs  Lab 06/23/19 0527 06/25/19 0430 06/25/19 1118 06/25/19 1810 06/26/19 0409  NA 144 142 144 141 142  K 4.8 5.7* 5.5* 5.3* 5.3*  CL 115* 114* 115* 115* 113*  CO2 22 22 21* 21* 21*  GLUCOSE 101* 170* 157* 191* 115*  BUN 61* 64* 61* 60* 58*  CREATININE 1.65* 1.87* 1.80* 1.86* 1.81*  CALCIUM 7.8* 7.8* 8.0* 7.8* 8.0*  MG  --  2.5*  --   --   --   PHOS  --  4.3  --   --   --    GFR: Estimated Creatinine Clearance: 45.6 mL/min (A) (by C-G formula based on SCr of 1.81 mg/dL (H)). Liver Function Tests: Recent Labs  Lab 06/20/19 0407 06/25/19 1810  AST 21 21  ALT 25 20  ALKPHOS 287* 253*  BILITOT 0.6 0.8  PROT 5.6* 5.5*  ALBUMIN 2.2* 2.2*   No results for input(s): LIPASE, AMYLASE in the last 168 hours. No results for input(s): AMMONIA in the last 168 hours. Coagulation Profile: Recent Labs  Lab  06/19/19 1349 06/22/19 0604 06/25/19 0430 06/25/19 1810  INR 1.2 1.5* 1.4* 1.4*   Cardiac Enzymes: No results for input(s): CKTOTAL, CKMB, CKMBINDEX, TROPONINI in the last 168 hours. BNP (last 3 results) No results for input(s): PROBNP in the last 8760 hours. HbA1C: No results for input(s): HGBA1C in the last 72 hours. CBG: Recent Labs  Lab 06/25/19 1144 06/25/19 1749 06/25/19 2120 06/26/19 0438 06/26/19 0817  GLUCAP 145* 160* 132* 117* 126*   Lipid Profile: Recent Labs    06/26/19 0409  CHOL 138  HDL 39*  LDLCALC 75  TRIG 119  CHOLHDL 3.5   Thyroid Function Tests: No results for input(s): TSH, T4TOTAL, FREET4, T3FREE, THYROIDAB in the last 72 hours. Anemia Panel: Recent Labs    06/24/19 0707  RETICCTPCT 1.7   Urine analysis:    Component Value Date/Time   COLORURINE YELLOW 06/25/2019 2359   APPEARANCEUR HAZY (A) 06/25/2019 2359   LABSPEC 1.019 06/25/2019 2359   PHURINE 5.0 06/25/2019 2359   GLUCOSEU 50 (A) 06/25/2019 2359   HGBUR SMALL (A) 06/25/2019 2359   [place] 06/25/2019 2359   [place] 06/25/2019 2359   PROTEINUR >=300 (A) 06/25/2019 2359   NITRITE NEGATIVE 06/25/2019 2359   LEUKOCYTESUR NEGATIVE 06/25/2019 2359   Sepsis Labs: @LABRCNTIP (procalcitonin:4,lacticidven:4)  ) Recent Results (from the past 240 hour(s))  SARS CORONAVIRUS 2 (TAT 6-24 HRS) Nasopharyngeal Nasopharyngeal Swab     Status: None   Collection Time: 06/19/19  6:16 PM   Specimen: Nasopharyngeal Swab  Result Value Ref Range Status   SARS Coronavirus 2 NEGATIVE NEGATIVE Final    Comment: (NOTE) SARS-CoV-2 target nucleic acids are NOT DETECTED. The SARS-CoV-2 RNA is generally detectable in upper and lower respiratory specimens during the acute phase of infection. Negative results do not preclude SARS-CoV-2 infection, do not rule out co-infections with other pathogens, and should not be used as the sole basis for treatment or other patient management  decisions. Negative results must be combined with clinical observations, patient history, and epidemiological information. The expected result is Negative. Fact Sheet for Patients: SugarRoll.be Fact Sheet for Healthcare Providers: https://www.woods-mathews.com/ This test is not yet approved or cleared by the [place] FDA and  has been authorized for detection and/or diagnosis of SARS-CoV-2 by FDA under an Emergency Use Authorization (EUA). This EUA will remain  in effect (meaning this test can be used) for the duration of the COVID-19 declaration under Section 56 4(b)(1) of the Act, 21 U.S.C. section 360bbb-3(b)(1), unless the authorization is terminated or revoked sooner. Performed at [facility], [place] [address], [place], [place] [address]   CULTURE, BLOOD (ROUTINE X 2) w Reflex to ID Panel     Status: None (Preliminary result)   Collection Time: 06/22/19 12:22 AM   Specimen: BLOOD  Result Value Ref Range Status   Specimen Description BLOOD BLOOD LEFT  HAND  Final   Special Requests   Final    BOTTLES DRAWN AEROBIC AND ANAEROBIC Blood Culture results may not be optimal due to an excessive volume of blood received in culture bottles   Culture   Final    NO GROWTH 4 DAYS Performed at [facility], [address] [place], [place] [address]    Report Status PENDING  Incomplete  CULTURE, BLOOD (ROUTINE X 2) w Reflex to ID Panel     Status: None (Preliminary result)   Collection Time: 06/22/19 12:22 AM   Specimen: BLOOD  Result Value Ref Range Status   Specimen Description BLOOD RIGHT ANTECUBITAL  Final   Special Requests   Final    BOTTLES DRAWN AEROBIC AND ANAEROBIC Blood Culture adequate volume   Culture   Final    NO GROWTH 4 DAYS Performed at [facility], [address], [place], [place] [address]    Report Status PENDING  Incomplete  Body fluid culture (includes gram stain)     Status: None    Collection Time: 06/22/19 10:18 AM   Specimen: Pleural Fluid  Result Value Ref Range Status   Specimen Description   Final    PLEURAL Performed at [facility], [address], [place], [place] [address]    Special Requests   Final    NONE Performed at [facility], [address] [place], [place] [address]    Gram Stain   Final    RARE WBC PRESENT,BOTH PMN AND MONONUCLEAR NO ORGANISMS SEEN    Culture   Final    NO GROWTH Performed at [facility], [place] [address], [place], [place] [address]    Report Status 06/25/2019 FINAL  Final  Aerobic/Anaerobic Culture (surgical/deep wound)     Status: None (Preliminary result)   Collection Time: 06/22/19 10:18 AM   Specimen: Pleural Fluid  Result Value Ref Range Status   Specimen Description   Final    PLEURAL Performed at [facility], [address] [place], [place] [address]    Special Requests   Final    Normal Performed at [facility], [address] [place], [place] [address]    Gram Stain   Final    RARE WBC PRESENT, PREDOMINANTLY MONONUCLEAR NO ORGANISMS SEEN    Culture   Final    NO GROWTH 3 DAYS NO ANAEROBES ISOLATED; CULTURE IN PROGRESS FOR 5 DAYS Performed at [facility], [place] [address] [place], [place] [address]    Report Status PENDING  Incomplete  Surgical pcr screen     Status: Abnormal   Collection Time: 06/25/19  8:39 AM   Specimen: Nasal Mucosa; Nasal Swab  Result Value Ref Range Status   MRSA, PCR NEGATIVE NEGATIVE Final   Staphylococcus aureus POSITIVE (A) NEGATIVE Final    Comment: (NOTE) The Xpert SA Assay (FDA approved for NASAL specimens in patients 29 years of age and older), is one component of a comprehensive surveillance program. It is not intended to diagnose infection nor to guide or monitor treatment. Performed at [facility], [place] [address], [place], [place] [address]       Radiology Studies: Dg Chest 2 View  Result  Date: 06/24/2019 CLINICAL DATA:  Left-sided chest tube 3 days. EXAM: CHEST - 2 VIEW COMPARISON:  06/22/2019 FINDINGS: Left basilar pleural drainage catheter unchanged. Stable left base opacification likely small to moderate effusion with associated basilar atelectasis. Right lung is clear. Cardiomediastinal  silhouette and remainder of the exam is unchanged. IMPRESSION: Stable left base opacification likely small to moderate effusion with associated basilar atelectasis. Left basilar chest tube unchanged. Electronically Signed   By: Marin Olp M.D.   On: 06/24/2019 15:33   Ct Chest Wo Contrast  Result Date: 06/24/2019 CLINICAL DATA:  Loculated pleural effusion, status post CT-guided drain placement EXAM: CT CHEST WITHOUT CONTRAST TECHNIQUE: Multidetector CT imaging of the chest was performed following the standard protocol without IV contrast. COMPARISON:  06/21/2019 FINDINGS: Cardiovascular: Three-vessel coronary artery calcifications and/or stents. Mild cardiomegaly. No pericardial effusion. Mediastinum/Nodes: No significant change in numerous enlarged mediastinal and hilar lymph nodes. Thyroid gland, trachea, and esophagus demonstrate no significant findings. Lungs/Pleura: Redemonstrated moderate, loculated left hydropneumothorax and small to moderate right pleural effusion, not significantly changed in volume compared to prior examination. There has been interval CT-guided placement of a pigtail chest tube about the dependent left lung base, with introduction of a small pneumothorax component. The left-sided pleura are thickened and rinded appearing and there is atelectasis or consolidation of the dependent lungs, left greater than right. There appear to be at least three substantial loculated components of the left-sided effusion, posteriorly, about the lung base, and within the major fissure (series 6, image 145). There is diffuse interlobular septal thickening (series 3, image 30). Upper Abdomen: Trace  ascites in the upper abdomen. Musculoskeletal: No chest wall mass or suspicious bone lesions identified. Anasarca. IMPRESSION: 1. Redemonstrated moderate, loculated left hydropneumothorax and small to moderate right pleural effusion, not significantly changed in volume compared to prior examination. There has been interval CT-guided placement of a pigtail chest tube about the dependent left lung base, with introduction of a small pneumothorax component. 2. The left-sided pleura are thickened and rinded appearing and there is atelectasis or consolidation of the dependent lungs, left greater than right. There appear to be at least three substantial loculated components of the left-sided effusion, posteriorly, about the lung base, and within the major fissure (series 6, image 145). 3. There is diffuse interlobular septal thickening (series 3, image 30), consistent with pulmonary edema. 4. No significant change in numerous enlarged mediastinal and hilar lymph nodes, likely reactive. 5.  Anasarca and ascites in the upper abdomen. 6.  Coronary artery disease. Electronically Signed   By: Eddie Candle M.D.   On: 06/24/2019 12:59     Scheduled Meds:  amLODipine  5 mg Oral Daily   Chlorhexidine Gluconate Cloth  6 each Topical Daily   insulin aspart  0-15 Units Subcutaneous TID WC   insulin aspart  0-5 Units Subcutaneous QHS   linezolid  600 mg Oral Q12H   metoprolol succinate  100 mg Oral Daily   mupirocin ointment  1 application Nasal BID   sodium zirconium cyclosilicate  5 g Oral BID   sodium zirconium cyclosilicate  5 g Oral Once   Continuous Infusions:   LOS: 2 days   Time Spent in minutes   45 minutes  Sallyanne Birkhead D.O. on 06/26/2019 at 9:11 AM  Between 7am to 7pm - Please see pager noted on [email or web address]  After 7pm go to [email or web address]  And look for the night coverage person covering for me after hours  [facility] Office  [phone]

## 2019-06-26 NOTE — Anesthesia Procedure Notes (Signed)
Arterial Line Insertion Start/End10/02/2019 12:45 PM, 06/26/2019 12:54 PM Performed by: CRNA  Patient location: Pre-op. Preanesthetic checklist: patient identified, IV checked, site marked, risks and benefits discussed, surgical consent, monitors and equipment checked, pre-op evaluation and anesthesia consent Left, radial was placed Catheter size: 20 G Hand hygiene performed , maximum sterile barriers used  and Seldinger technique used Allen's test indicative of satisfactory collateral circulation Attempts: 1 (placed by a Surveyor, mining. ) Procedure performed without using ultrasound guided technique. Ultrasound Notes:anatomy identified, needle tip was noted to be adjacent to the nerve/plexus identified, no ultrasound evidence of intravascular and/or intraneural injection and image(s) printed for medical record Following insertion, dressing applied and Biopatch. Post procedure assessment: normal  Patient tolerated the procedure well with no immediate complications.

## 2019-06-26 NOTE — Anesthesia Procedure Notes (Signed)
Procedure Name: Intubation Date/Time: 06/26/2019 1:31 PM Performed by: Cleda Daub, CRNA Pre-anesthesia Checklist: Patient identified, Emergency Drugs available, Suction available and Patient being monitored Patient Re-evaluated:Patient Re-evaluated prior to induction Oxygen Delivery Method: Circle system utilized Preoxygenation: Pre-oxygenation with 100% oxygen Induction Type: IV induction Ventilation: Mask ventilation without difficulty and Mask ventilation throughout procedure Laryngoscope Size: Mac and 3 Grade View: Grade I Endobronchial tube: Left, Double lumen EBT and EBT position confirmed by fiberoptic bronchoscope and 39 Fr Number of attempts: 1 Airway Equipment and Method: Stylet and Fiberoptic brochoscope Placement Confirmation: ETT inserted through vocal cords under direct vision,  positive ETCO2 and breath sounds checked- equal and bilateral Tube secured with: Tape Dental Injury: Teeth and Oropharynx as per pre-operative assessment

## 2019-06-26 NOTE — Anesthesia Preprocedure Evaluation (Addendum)
Anesthesia Evaluation   Patient awake    Reviewed: Allergy & Precautions, NPO status , Patient's Chart, lab work & pertinent test results, reviewed documented beta blocker date and time   History of Anesthesia Complications Negative for: history of anesthetic complications  Airway Mallampati: II  TM Distance: >3 FB Neck ROM: Full    Dental  (+) Missing, Loose, Poor Dentition, Dental Advisory Given   Pulmonary shortness of breath,  L hydropneumothorax, empyema small R pleural effusion    + decreased breath sounds      Cardiovascular hypertension, Pt. on medications and Pt. on home beta blockers (-) angina+ dysrhythmias Atrial Fibrillation  Rhythm:Irregular Rate:Normal  ECG: rate 83. Normal sinus rhythm Low voltage QRS T wave abnormality, consider inferior ischemia  ECHO: 1. Left ventricular ejection fraction, by visual estimation, is 45 to 50%. The left ventricle has mildly decreased function. Normal left ventricular size. There is no left ventricular hypertrophy.  2. Global right ventricle has normal systolic function.The right ventricular size is normal. No increase in right ventricular wall thickness.  3. Left atrial size was normal.  4. Right atrial size was normal.  5. The mitral valve is normal in structure. Mild mitral valve regurgitation. No evidence of mitral stenosis.  6. The tricuspid valve is normal in structure. Tricuspid valve regurgitation mild-moderate.  7. The aortic valve is normal in structure. Aortic valve regurgitation was not visualized by color flow Doppler. Structurally normal aortic valve, with no evidence of sclerosis or stenosis.  8. The pulmonic valve was normal in structure. Pulmonic valve regurgitation is not visualized by color flow Doppler.  9. Mildly elevated pulmonary artery systolic pressure. 10. The inferior vena cava is normal in size with greater than 50% respiratory variability, suggesting  right atrial pressure of 3 mmHg  06/14/2019 ECHO:  1. Left ventricular ejection fraction, by visual estimation, is 50 to 55%. The left ventricle has normal function. There is mildly increased left ventricular hypertrophy.  2. Global right ventricle has normal systolic function.The right ventricular size is normal. No increase in right ventricular wall thickness.  3. Left atrial size was normal.  4. No valve vegetation noted.   Neuro/Psych negative neurological ROS     GI/Hepatic negative GI ROS, Neg liver ROS,   Endo/Other  diabetes (glu 129), Insulin Dependent  Renal/GU Renal InsufficiencyRenal disease (creat 1.74, K+ 5.5)     Musculoskeletal negative musculoskeletal ROS (+)   Abdominal   Peds  Hematology  (+) Blood dyscrasia (Hb 8.6), anemia , eliquis   Anesthesia Other Findings Empyema  Reproductive/Obstetrics                           Anesthesia Physical Anesthesia Plan  ASA: III  Anesthesia Plan: General   Post-op Pain Management:    Induction: Intravenous  PONV Risk Score and Plan: 2 and Ondansetron, Dexamethasone, Midazolam and Treatment may vary due to age or medical condition  Airway Management Planned: Double Lumen EBT  Additional Equipment: Arterial line and CVP  Intra-op Plan:   Post-operative Plan: Extubation in OR  Informed Consent: I have reviewed the patients History and Physical, chart, labs and discussed the procedure including the risks, benefits and alternatives for the proposed anesthesia with the patient or authorized representative who has indicated his/her understanding and acceptance.     Dental advisory given  Plan Discussed with: CRNA and Surgeon  Anesthesia Plan Comments:        Anesthesia Quick Evaluation

## 2019-06-27 ENCOUNTER — Inpatient Hospital Stay (HOSPITAL_COMMUNITY): Payer: Self-pay

## 2019-06-27 ENCOUNTER — Encounter (HOSPITAL_COMMUNITY): Payer: Self-pay | Admitting: Thoracic Surgery (Cardiothoracic Vascular Surgery)

## 2019-06-27 LAB — BASIC METABOLIC PANEL
Anion gap: 4 — ABNORMAL LOW (ref 5–15)
Anion gap: 8 (ref 5–15)
Anion gap: 9 (ref 5–15)
BUN: 67 mg/dL — ABNORMAL HIGH (ref 6–20)
BUN: 68 mg/dL — ABNORMAL HIGH (ref 6–20)
BUN: 69 mg/dL — ABNORMAL HIGH (ref 6–20)
CO2: 22 mmol/L (ref 22–32)
CO2: 22 mmol/L (ref 22–32)
CO2: 22 mmol/L (ref 22–32)
Calcium: 7.6 mg/dL — ABNORMAL LOW (ref 8.9–10.3)
Calcium: 7.7 mg/dL — ABNORMAL LOW (ref 8.9–10.3)
Calcium: 8 mg/dL — ABNORMAL LOW (ref 8.9–10.3)
Chloride: 116 mmol/L — ABNORMAL HIGH (ref 98–111)
Chloride: 114 mmol/L — ABNORMAL HIGH (ref 98–111)
Chloride: 112 mmol/L — ABNORMAL HIGH (ref 98–111)
Creatinine, Ser: 1.97 mg/dL — ABNORMAL HIGH (ref 0.61–1.24)
Creatinine, Ser: 2.1 mg/dL — ABNORMAL HIGH (ref 0.61–1.24)
Creatinine, Ser: 2.23 mg/dL — ABNORMAL HIGH (ref 0.61–1.24)
GFR calc Af Amer: 43 mL/min — ABNORMAL LOW (ref >60)
GFR calc Af Amer: 40 mL/min — ABNORMAL LOW (ref >60)
GFR calc Af Amer: 37 mL/min — ABNORMAL LOW (ref >60)
GFR calc non Af Amer: 37 mL/min — ABNORMAL LOW (ref >60)
GFR calc non Af Amer: 34 mL/min — ABNORMAL LOW (ref >60)
GFR calc non Af Amer: 32 mL/min — ABNORMAL LOW (ref >60)
Glucose, Bld: 111 mg/dL — ABNORMAL HIGH (ref 70–99)
Glucose, Bld: 178 mg/dL — ABNORMAL HIGH (ref 70–99)
Glucose, Bld: 147 mg/dL — ABNORMAL HIGH (ref 70–99)
Potassium: 6 mmol/L — ABNORMAL HIGH (ref 3.5–5.1)
Potassium: 5.9 mmol/L — ABNORMAL HIGH (ref 3.5–5.1)
Potassium: 5.3 mmol/L — ABNORMAL HIGH (ref 3.5–5.1)
Sodium: 142 mmol/L (ref 135–145)
Sodium: 144 mmol/L (ref 135–145)
Sodium: 143 mmol/L (ref 135–145)

## 2019-06-27 LAB — AEROBIC/ANAEROBIC CULTURE W GRAM STAIN (SURGICAL/DEEP WOUND)
Culture: NO GROWTH
Special Requests: NORMAL

## 2019-06-27 LAB — CULTURE, BLOOD (ROUTINE X 2)
Culture: NO GROWTH
Culture: NO GROWTH
Special Requests: ADEQUATE

## 2019-06-27 LAB — TYPE AND SCREEN
ABO/RH(D): B POS
Antibody Screen: NEGATIVE
Unit division: 0

## 2019-06-27 LAB — FERRITIN: Ferritin: 384 ng/mL — ABNORMAL HIGH (ref 24–336)

## 2019-06-27 LAB — CBC
HCT: 26.9 % — ABNORMAL LOW (ref 39.0–52.0)
Hemoglobin: 8.8 g/dL — ABNORMAL LOW (ref 13.0–17.0)
MCH: 30.3 pg (ref 26.0–34.0)
MCHC: 32.7 g/dL (ref 30.0–36.0)
MCV: 92.8 fL (ref 80.0–100.0)
Platelets: 311 10*3/uL (ref 150–400)
RBC: 2.9 MIL/uL — ABNORMAL LOW (ref 4.22–5.81)
RDW: 13.2 % (ref 11.5–15.5)
WBC: 10.4 10*3/uL (ref 4.0–10.5)
nRBC: 0 % (ref 0.0–0.2)

## 2019-06-27 LAB — BLOOD GAS, ARTERIAL
Acid-base deficit: 3.9 mmol/L — ABNORMAL HIGH (ref 0.0–2.0)
Bicarbonate: 21.3 mmol/L (ref 20.0–28.0)
Collection site: 27027
FIO2: 21
O2 Saturation: 97.9 %
Patient temperature: 98.6
pCO2 arterial: 43.1 mmHg (ref 32.0–48.0)
pH, Arterial: 7.313 — ABNORMAL LOW (ref 7.350–7.450)
pO2, Arterial: 107 mmHg (ref 83.0–108.0)

## 2019-06-27 LAB — ACID FAST SMEAR (AFB, MYCOBACTERIA)
Acid Fast Smear: NEGATIVE
Acid Fast Smear: NEGATIVE

## 2019-06-27 LAB — RENAL FUNCTION PANEL
Albumin: 2.4 g/dL — ABNORMAL LOW (ref 3.5–5.0)
Anion gap: 4 — ABNORMAL LOW (ref 5–15)
BUN: 65 mg/dL — ABNORMAL HIGH (ref 6–20)
CO2: 22 mmol/L (ref 22–32)
Calcium: 7.7 mg/dL — ABNORMAL LOW (ref 8.9–10.3)
Chloride: 115 mmol/L — ABNORMAL HIGH (ref 98–111)
Creatinine, Ser: 1.89 mg/dL — ABNORMAL HIGH (ref 0.61–1.24)
GFR calc Af Amer: 45 mL/min — ABNORMAL LOW (ref >60)
GFR calc non Af Amer: 39 mL/min — ABNORMAL LOW (ref >60)
Glucose, Bld: 112 mg/dL — ABNORMAL HIGH (ref 70–99)
Phosphorus: 5.8 mg/dL — ABNORMAL HIGH (ref 2.5–4.6)
Potassium: 6 mmol/L — ABNORMAL HIGH (ref 3.5–5.1)
Sodium: 141 mmol/L (ref 135–145)

## 2019-06-27 LAB — GLUCOSE, CAPILLARY
Glucose-Capillary: 108 mg/dL — ABNORMAL HIGH (ref 70–99)
Glucose-Capillary: 129 mg/dL — ABNORMAL HIGH (ref 70–99)
Glucose-Capillary: 133 mg/dL — ABNORMAL HIGH (ref 70–99)
Glucose-Capillary: 170 mg/dL — ABNORMAL HIGH (ref 70–99)
Glucose-Capillary: 96 mg/dL (ref 70–99)
Glucose-Capillary: 97 mg/dL (ref 70–99)

## 2019-06-27 LAB — BPAM RBC
Blood Product Expiration Date: 202010302359
ISSUE DATE / TIME: 202010061422
Unit Type and Rh: 7300

## 2019-06-27 LAB — IRON AND TIBC
Iron: 94 ug/dL (ref 45–182)
Saturation Ratios: 49 % — ABNORMAL HIGH (ref 17.9–39.5)
TIBC: 192 ug/dL — ABNORMAL LOW (ref 250–450)
UIBC: 98 ug/dL

## 2019-06-27 LAB — CHOLESTEROL, BODY FLUID: Cholesterol, Fluid: 40 mg/dL

## 2019-06-27 LAB — SURGICAL PATHOLOGY

## 2019-06-27 LAB — C4 COMPLEMENT: Complement C4, Body Fluid: 27 mg/dL (ref 14–44)

## 2019-06-27 LAB — C3 COMPLEMENT: C3 Complement: 123 mg/dL (ref 82–167)

## 2019-06-27 MED ORDER — SODIUM BICARBONATE-DEXTROSE 150-5 MEQ/L-% IV SOLN: 150.0000 meq | INTRAVENOUS | Status: DC

## 2019-06-27 MED ORDER — SODIUM POLYSTYRENE SULFONATE 15 GM/60ML PO SUSP
30.0000 g | Freq: Once | ORAL | Status: AC
  Administered 2019-06-27: 10:00:00 30 g via ORAL
  Filled 2019-06-27: qty 120

## 2019-06-27 MED ORDER — SODIUM ZIRCONIUM CYCLOSILICATE 10 G PO PACK
10.0000 g | PACK | Freq: Three times a day (TID) | ORAL | Status: DC
  Administered 2019-06-27 – 2019-06-28 (×2): 10 g via ORAL
  Filled 2019-06-27 (×4): qty 1

## 2019-06-27 MED ORDER — INSULIN ASPART 100 UNIT/ML ~~LOC~~ SOLN
0.0000 [IU] | Freq: Three times a day (TID) | SUBCUTANEOUS | Status: DC
  Administered 2019-06-27: 12:00:00 4 [IU] via SUBCUTANEOUS
  Administered 2019-06-27 – 2019-06-28 (×2): 2 [IU] via SUBCUTANEOUS
  Administered 2019-06-28: 17:00:00 4 [IU] via SUBCUTANEOUS
  Administered 2019-06-28 – 2019-06-30 (×4): 2 [IU] via SUBCUTANEOUS

## 2019-06-27 MED ORDER — SODIUM BICARBONATE 8.4 % IV SOLN
50.0000 meq | Freq: Once | INTRAVENOUS | Status: AC
  Administered 2019-06-27: 11:00:00 50 meq via INTRAVENOUS
  Filled 2019-06-27 (×2): qty 50

## 2019-06-27 MED ORDER — SODIUM ZIRCONIUM CYCLOSILICATE 10 G PO PACK
10.0000 g | PACK | Freq: Once | ORAL | Status: DC
  Filled 2019-06-27: qty 1

## 2019-06-27 MED ORDER — CHLORHEXIDINE GLUCONATE CLOTH 2 % EX PADS
6.0000 | MEDICATED_PAD | Freq: Every day | CUTANEOUS | Status: DC
  Administered 2019-06-27 – 2019-06-30 (×3): 6 via TOPICAL

## 2019-06-27 MED ORDER — SODIUM ZIRCONIUM CYCLOSILICATE 10 G PO PACK
10.0000 g | PACK | Freq: Every day | ORAL | Status: DC
  Administered 2019-06-27: 10 g via ORAL
  Filled 2019-06-27: qty 1

## 2019-06-27 MED ORDER — FUROSEMIDE 10 MG/ML IJ SOLN
40.0000 mg | Freq: Two times a day (BID) | INTRAMUSCULAR | Status: DC
  Administered 2019-06-27 – 2019-06-30 (×6): 40 mg via INTRAVENOUS
  Filled 2019-06-27 (×6): qty 4

## 2019-06-27 MED ORDER — SODIUM BICARBONATE 8.4 % IV SOLN
50.0000 meq | Freq: Once | INTRAVENOUS | Status: DC
  Filled 2019-06-27: qty 50

## 2019-06-27 NOTE — Plan of Care (Signed)
Poc progressing.  

## 2019-06-27 NOTE — Progress Notes (Signed)
Received pt from PACU, ao x4. Vitals stable at the time of arrival. Left lateral chest tube draining serosanguineous drainage, connected to -20 suction. Denies any pain. Breathing even and unlabored in 2l o2. CHG bath completed, connected to tele, ccmd notified. Oriented pt to room and call bell system. Will continue to monitor.

## 2019-06-27 NOTE — Progress Notes (Addendum)
[address]       [place] [address]             [phone]      1 Day Post-Op Procedure(s) (LRB): VIDEO ASSISTED THORACOSCOPY DECORTICATION (Left) VIDEO BRONCHOSCOPY (N/A) Subjective: No significant SOB, minor sputum production  Objective: Vital signs in last 24 hours: Temp:  [96.8 F (36 C)-98 F (36.7 C)] 98 F (36.7 C) (10/07 0400) Pulse Rate:  [52-67] 53 (10/07 0400) Cardiac Rhythm: Sinus bradycardia (10/07 0809) Resp:  [10-18] 10 (10/07 0400) BP: (107-177)/(67-84) 107/67 (10/07 0400) SpO2:  [98 %-100 %] 100 % (10/07 0400) Arterial Line BP: (152-158)/(64-68) 156/67 (10/06 1700)  Hemodynamic parameters for last 24 hours:    Intake/Output from previous day: 10/06 0701 - 10/07 0700 In: [id number] [P.O.:100; I.V.:800; Blood:315; IV Piggyback:500] Out: 580 [Urine:100; Blood:150; Chest Tube:330] Intake/Output this shift: No intake/output data recorded.  General appearance: alert, cooperative and no distress Heart: regular rate and rhythm Lungs: clear BS Abdomen: benign Extremities: no edema Wound: dressings CDI  Lab Results: Recent Labs    06/26/19 0409 06/26/19 1359 06/27/19 0500  WBC 6.6  --  10.4  HGB 8.6* 7.1* 8.8*  HCT 27.9* 21.0* 26.9*  PLT 391  --  311   BMET:  Recent Labs    06/26/19 1050 06/26/19 1359 06/27/19 0500  NA 143 146* 141  K 5.5* 4.2 6.0*  CL 114*  --  115*  CO2 21*  --  22  GLUCOSE 126*  --  112*  BUN 56*  --  65*  CREATININE 1.74*  --  1.89*  CALCIUM 8.2*  --  7.7*    PT/INR:  Recent Labs    06/25/19 1810  LABPROT 16.5*  INR 1.4*   ABG    Component Value Date/Time   PHART 7.313 (L) 06/27/2019 0440   HCO3 21.3 06/27/2019 0440   TCO2 18 (L) 06/26/2019 1359   ACIDBASEDEF 3.9 (H) 06/27/2019 0440   O2SAT 97.9 06/27/2019 0440   CBG (last 3)  Recent Labs    06/27/19 0038 06/27/19 0418 06/27/19 0815  GLUCAP 133* 96 97    Meds Scheduled Meds:  acetaminophen  1,000 mg Oral Q6H   Or    acetaminophen (TYLENOL) oral liquid 160 mg/5 mL  1,000 mg Oral Q6H   bisacodyl  10 mg Oral Daily   Chlorhexidine Gluconate Cloth  6 each Topical Daily   enoxaparin (LOVENOX) injection  40 mg Subcutaneous Daily   insulin aspart  0-24 Units Subcutaneous Q4H   linezolid  600 mg Oral Q12H   senna-docusate  1 tablet Oral QHS   sodium bicarbonate  50 mEq Intravenous Once   Continuous Infusions:  sodium chloride     PRN Meds:.Place/Maintain arterial line **AND** sodium chloride, morphine injection, ondansetron (ZOFRAN) IV, traMADol  Xrays Dg Chest Port 1 View  Result Date: 06/27/2019 CLINICAL DATA:  Empyema status post VATS EXAM: PORTABLE CHEST 1 VIEW COMPARISON:  06/26/2019 FINDINGS: The left-sided chest tube is stable. No definite pneumothorax. No residual pleural effusion. The right IJ catheter is stable. Improved lung aeration bilaterally with clearing postoperative atelectasis. IMPRESSION: 1. Support apparatus in good position, unchanged. 2. Improved lung aeration with clearing atelectasis. 3. No pneumothorax or recurrent effusion on the left. Electronically Signed   By: Marijo Sanes M.D.   On: 06/27/2019 08:08   Dg Chest Port 1 View  Result Date: 06/26/2019 CLINICAL DATA:  Empyema, postoperative EXAM: PORTABLE CHEST 1 VIEW COMPARISON:  06/26/2019, 7:38 a.m. FINDINGS: Interval postoperative findings of left thoracotomy with left-sided chest tube and improved aeration of the left lung base. A previously seen pigtail chest catheter about the left lung base has been removed. No significant pneumothorax. There is new atelectasis or consolidation of the inferior right upper lobe. Interval placement of a right neck vascular catheter, tip positioned near the superior cavoatrial junction. The heart mediastinum are unremarkable. IMPRESSION: 1. Interval postoperative findings of left thoracotomy with left-sided chest tube and improved aeration of the left lung base. A previously seen pigtail chest  catheter about the left lung base has been removed. No significant pneumothorax. 2. There is new atelectasis or consolidation of the inferior right upper lobe. 3. Interval placement of a right neck vascular catheter, tip positioned near the superior cavoatrial junction. Electronically Signed   By: Eddie Candle M.D.   On: 06/26/2019 16:43   Dg Chest Port 1 View  Result Date: 06/26/2019 CLINICAL DATA:  Preop for video-assisted thoracic surgery. EXAM: PORTABLE CHEST 1 VIEW COMPARISON:  Radiographs of June 24, 2019. FINDINGS: Stable cardiomediastinal silhouette. No pneumothorax is noted. Improved left basilar opacity is noted suggesting improving atelectasis and effusion. Stable position of left-sided pleural drainage catheter. Right lung is unremarkable. Bony thorax is unremarkable. IMPRESSION: Stable position of left-sided pleural drainage catheter. Improved left basilar atelectasis and effusion is noted. Electronically Signed   By: Marijo Conception M.D.   On: 06/26/2019 10:03    Assessment/Plan: S/P Procedure(s) (LRB): VIDEO ASSISTED THORACOSCOPY DECORTICATION (Left) VIDEO BRONCHOSCOPY (N/A)  1 hemodyn stable in sinus rhythm/brady, hypertensive at times. No fevers 2 sats excellent on RA 3 chest tube no air leak, 330 cc drainage , continue 4 anemia is pretty stable 5 renal insuff- nephrology has left recommendations 6 CXR - improved appearance- push pulm toilet, IS/flutter valve 7 medical management per primary  LOS: 3 days   Addendum- will probably resume ACRx(eliquis) tomorrow, and leave CT's to monitor for bleeding John Giovanni PA-C 06/27/2019 Pager [phone]  Agree with above Doing well Will continue chest tube to suction for now [employer]

## 2019-06-27 NOTE — Progress Notes (Signed)
Subjective:  S/p VATS.  Only 100 of urine recorded, foley catheter out and he admits not much urine- crt and K both up today - renal ultrasound still  pending - iron sat good   Objective Vital signs in last 24 hours: Vitals:   06/27/19 0012 06/27/19 0400 06/27/19 0800 06/27/19 1212  BP: 123/69 107/67 139/69 126/69  Pulse: (!) 52 (!) 53 (!) 56 61  Resp: 10 10 15 11   Temp: 97.8 F (36.6 C) 98 F (36.7 C)    TempSrc: Oral Oral    SpO2: 100% 100% 100% 99%  Weight:       Weight change:   Intake/Output Summary (Last 24 hours) at 06/27/2019 1540 Last data filed at 06/27/2019 1253 Gross per 24 hour  Intake 420 ml  Output 430 ml  Net -10 ml   Assessment/Plan: 58 year old male with complicated past medical history including diabetes, hypertension-both of which are under poor control.  Likely has some baseline CKD-even though creatinine in June appeared normal it is an outlier.  Over the last 2 weeks he has had a prolonged hospitalization with infectious complications.  Creatinine is possibly above baseline but had been stable and indicated a GFR in the 40s 1.Renal-possibly some baseline CKD with creatinine in December 2019 1.6.  True that there is a creatinine in the system of 1.1 in June however that is the outlier.  Since hospitalized with infectious complications including a MRSA bacteremia-patient has had worsening of his CKD, however creatinine had been fairly stable in the 1.6-1.8 range indicating a GFR in the 40s until the VATS.  Now with A on CRF in the setting of post op from VATS- crt over 2.  No obvious hemodynamic issue but still suspect is the cause- will recheck U/A although the one from 10/5 did not show blood 2. Hypertension/volume  -blood pressure better, even low post op.  Was on Toprol but appears to have been stopped.  Likely would be a candidate for ACE or ARB but would not start it until outpatient setting and not with now hyperkalemia.  He does appear to be slightly volume  overloaded but also with a low albumin so is likely third spacing.  I do not suspect that he needs major diuresis at this point but with the low UOP and high K - I will start  oral Lasix  3.  Hyperkalemia-mild and handled by the [facility].  I would continue to use that as needed- have added daily- actually TID  given spike of potassium today  4. Anemia  -probably mostly due to hospitalization but could have some anemia of CKD.   iron stores ok,  Got a transfusion of one unit yesterday     Nathan [name]    Labs: Basic Metabolic Panel: Recent Labs  Lab 06/25/19 0430  06/27/19 0500 06/27/19 0824 06/27/19 1215  NA 142   < > 141 142 144  K 5.7*   < > 6.0* 6.0* 5.9*  CL 114*   < > 115* 116* 114*  CO2 22   < > 22 22 22   GLUCOSE 170*   < > 112* 111* 178*  BUN 64*   < > 65* 67* 68*  CREATININE 1.87*   < > 1.89* 1.97* 2.10*  CALCIUM 7.8*   < > 7.7* 7.6* 7.7*  PHOS 4.3  --  5.8*  --   --    < > = values in this interval not displayed.   Liver Function Tests: Recent  Labs  Lab 06/25/19 1810 06/27/19 0500  AST 21  --   ALT 20  --   ALKPHOS 253*  --   BILITOT 0.8  --   PROT 5.5*  --   ALBUMIN 2.2* 2.4*   No results for input(s): LIPASE, AMYLASE in the last 168 hours. No results for input(s): AMMONIA in the last 168 hours. CBC: Recent Labs  Lab 06/24/19 0707 06/25/19 0430 06/25/19 1810 06/26/19 0409 06/26/19 1359 06/27/19 0500  WBC 6.7 7.0 8.2 6.6  --  10.4  NEUTROABS 5.1  --   --   --   --   --   HGB 9.0* 8.1* 9.3* 8.6* 7.1* 8.8*  HCT 28.3* 25.0* 28.7* 27.9* 21.0* 26.9*  MCV 92.2 92.3 92.9 93.3  --  92.8  PLT 455* 404* 389 391  --  311   Cardiac Enzymes: No results for input(s): CKTOTAL, CKMB, CKMBINDEX, TROPONINI in the last 168 hours. CBG: Recent Labs  Lab 06/26/19 2234 06/27/19 0038 06/27/19 0418 06/27/19 0815 06/27/19 1153  GLUCAP 153* 133* 96 97 170*    Iron Studies:  Recent Labs    06/27/19 0500  IRON 94  TIBC 192*  FERRITIN 384*    Studies/Results: Dg Chest Port 1 View  Result Date: 06/27/2019 CLINICAL DATA:  Empyema status post VATS EXAM: PORTABLE CHEST 1 VIEW COMPARISON:  06/26/2019 FINDINGS: The left-sided chest tube is stable. No definite pneumothorax. No residual pleural effusion. The right IJ catheter is stable. Improved lung aeration bilaterally with clearing postoperative atelectasis. IMPRESSION: 1. Support apparatus in good position, unchanged. 2. Improved lung aeration with clearing atelectasis. 3. No pneumothorax or recurrent effusion on the left. Electronically Signed   By: Marijo Sanes M.D.   On: 06/27/2019 08:08   Dg Chest Port 1 View  Result Date: 06/26/2019 CLINICAL DATA:  Empyema, postoperative EXAM: PORTABLE CHEST 1 VIEW COMPARISON:  06/26/2019, 7:38 a.m. FINDINGS: Interval postoperative findings of left thoracotomy with left-sided chest tube and improved aeration of the left lung base. A previously seen pigtail chest catheter about the left lung base has been removed. No significant pneumothorax. There is new atelectasis or consolidation of the inferior right upper lobe. Interval placement of a right neck vascular catheter, tip positioned near the superior cavoatrial junction. The heart mediastinum are unremarkable. IMPRESSION: 1. Interval postoperative findings of left thoracotomy with left-sided chest tube and improved aeration of the left lung base. A previously seen pigtail chest catheter about the left lung base has been removed. No significant pneumothorax. 2. There is new atelectasis or consolidation of the inferior right upper lobe. 3. Interval placement of a right neck vascular catheter, tip positioned near the superior cavoatrial junction. Electronically Signed   By: Eddie Candle M.D.   On: 06/26/2019 16:43   Dg Chest Port 1 View  Result Date: 06/26/2019 CLINICAL DATA:  Preop for video-assisted thoracic surgery. EXAM: PORTABLE CHEST 1 VIEW COMPARISON:  Radiographs of June 24, 2019. FINDINGS:  Stable cardiomediastinal silhouette. No pneumothorax is noted. Improved left basilar opacity is noted suggesting improving atelectasis and effusion. Stable position of left-sided pleural drainage catheter. Right lung is unremarkable. Bony thorax is unremarkable. IMPRESSION: Stable position of left-sided pleural drainage catheter. Improved left basilar atelectasis and effusion is noted. Electronically Signed   By: Marijo Conception M.D.   On: 06/26/2019 10:03   Medications: Infusions:   Scheduled Medications: . acetaminophen  1,000 mg Oral Q6H   Or  . acetaminophen (TYLENOL) oral liquid 160 mg/5 mL  1,000  mg Oral Q6H  . bisacodyl  10 mg Oral Daily  . Chlorhexidine Gluconate Cloth  6 each Topical Daily  . enoxaparin (LOVENOX) injection  40 mg Subcutaneous Daily  . insulin aspart  0-24 Units Subcutaneous TID AC & HS  . linezolid  600 mg Oral Q12H  . senna-docusate  1 tablet Oral QHS  . sodium zirconium cyclosilicate  10 g Oral Daily    have reviewed scheduled and prn medications.  Physical Exam: General: alert, sitting at bedside no c/o's  Heart: RRR Lungs: mostly clear- CT in place Abdomen: soft, non tender Extremities: 1 + pitting edema to LE's     06/27/2019,3:40 PM  LOS: 3 days

## 2019-06-27 NOTE — Progress Notes (Signed)
PROGRESS NOTE    Nathan [name]  NWG:[id number] DOB: 1961-06-11 DOA: 06/24/2019 PCP: Derinda Late, MD   Brief Narrative: 58 year old with past medical history significant for insulin-dependent diabetes, hypertension, A. fib on Eliquis noncompliant due to affordability issues, anemia of chronic disease, chronic kidney disease a stage III, right thumb cellulitis status post IND and MRSA bacteremia on Zyvox is a direct transfer from [facility] for concern of left hydropneumothorax.  Patient admitted on 06/19/2019 due to fluid overload anasarca his x-ray showed infiltrates and BNP was elevated.  Patient is started on antibiotics for concern of healthcare associated pneumonia vancomycin and Rocephin was started initially which was changed to Zyvox as per ID recommendation.  Patient was given IV Lasix for concern of acute on chronic CHF.  COVID-19 was negative.  Patient remained afebrile, no leukocytosis.  A CT chest showed collection of the left that could be empyema with a right upper lobe possible infiltrates.  Patient had left chest catheter placement by IR on 06/22/2019.  Per surgery Dr.: No real change on x-ray after placement of pigtail.  Dr. Adora Fridge discussed with Dr. Servando Snare thoracic surgery who recommended transfer to [facility] for possible VATS.  Repeat CT chest from 10//2020 showed moderate loculated left hydropneumothorax and a small to moderate right pleural effusion not significantly changed in volume compared to prior examination. Patient underwent VATS procedure on 10/6   Assessment & Plan:   Principal Problem:   Hydropneumothorax Active Problems:   CKD (chronic kidney disease), stage III   Hypertension   Insulin dependent diabetes mellitus type IA (HCC)   Anemia of chronic disease   Atrial fibrillation, chronic (HCC)   Acute on chronic combined systolic and diastolic CHF (congestive heart failure) (HCC)   Cellulitis of right hand   1-Left side  hydropneumothorax, complex left side pleural effusion/empyema -Noted on CT. patient was transferred from [facility] to [facility] for VATS procedure. -Patient status post pigtail catheter placement by IR on 06/22/2019 at [facility]. -Patient underwent VATS on 57/04/4695  2-Acute systolic heart failure: Patient presented with lower extremity edema and elevated BNP on 06/19/2019 -Echocardiogram show ejection fraction 40 to 45%.  TEE ejection fraction 50 to 55% -Patient received IV Lasix at [facility], currently on hold due to AKI -Continue to monitor output  3-Hyperkalemia: He has receive Lokelma daily. Discussed with nephrology, will give an amp of bicarb and Kayexalate one-time 1. Repeat be met this afternoon and tonight. To receive another dose of Lokelma today.  4-AKI on chronic kidney disease 3: Possible AKI, possible related to medication antibiotics. While at [facility] patient creatinine range 1.4-1.6. Creatinine was 1.25 August 2018 however improved with March and June 2020. Creatinine trending up. Discussed with nephrology. Renal US ordered.  5-History of right hand cellulitis with MRSA bacteremia MRSA abscess of the right thumb: -Infectious disease was following patient at [facility]. -Blood cultures from [facility] 06/18/2019 MRSA positive wound culture 9/21 MRSA positive. -Subsequent culture has been negative. -He was placed on Zyvox 600 mg twice daily with his last dose 06/29/2019 -TEE was negative during previous hospitalization.  6-A. fib, paroxysmal: Patient was on Eliquis, this was restarted at [facility]. Currently he is in sinus rhythm Continue with Lopressor. He was on Eliquis, I have ask CVTS to comment on when to resume Eliquis.   7-Hypertension: Continue to hold ACE  8-Diabetes type 2: Continue sliding scale insulin  9-Left upper  extremity edema: Doppler ordered  10-Anemia;  Follow trend.   Estimated  body mass index is 25.13 kg/m as calculated from the  following:   Height as of 06/19/19: _0  (1.753 m).   Weight as of this encounter: 77.2 kg.   DVT prophylaxis: Lovenox Code Status: Full code Family Communication: care discussed with patient.  Disposition Plan: remain in patient for treatment of hydropneumothorax, hyperkalemia. AKI.  Consultants:   CVTS  Cardiology  Nephrology  Procedures:   Doppler Upper extre;  Renal US.   Antimicrobials:  Zyvox  Subjective: He is breathing well. He wants to know when will he be able to be discharge. His Potasium is up and worsening renal function.   Objective: Vitals:   06/26/19 1945 06/26/19 2015 06/27/19 0012 06/27/19 0400  BP: 135/77 (!) 151/84 123/69 107/67  Pulse: (!) 54 (!) 55 (!) 52 (!) 53  Resp: _1 Temp:  98 F (36.7 C) 97.8 F (36.6 C) 98 F (36.7 C)  TempSrc:  Oral Oral Oral  SpO2: 100% 100% 100% 100%  Weight:        Intake/Output Summary (Last 24 hours) at 06/27/2019 0851 Last data filed at 06/27/2019 0500 Gross per 24 hour  Intake 1715 ml  Output 580 ml  Net 1135 ml   Filed Weights   06/26/19 0500  Weight: 77.2 kg    Examination:  General exam: Appears calm and comfortable  Respiratory system: crackles bases Respiratory effort normal. Cardiovascular system: S1 & S2 heard, RRR. No JVD, murmurs, rubs, gallops or clicks. No pedal edema. Gastrointestinal system: Abdomen is nondistended, soft and nontender. No organomegaly or masses felt. Normal bowel sounds heard. Central nervous system: Alert and oriented. No focal neurological deficits. Extremities: Symmetric 5 x 5 power. Skin: No rashes, lesions or ulcers Psychiatry: Judgement and insight appear normal. Mood & affect appropriate.     Data Reviewed: I have personally reviewed following labs and imaging studies  CBC: Recent Labs  Lab 06/24/19 0707 06/25/19 0430 06/25/19 1810 06/26/19 0409 06/26/19 1359 06/27/19 0500  WBC 6.7 7.0 8.2 6.6  --  10.4  NEUTROABS 5.1  --   --   --   --    --   HGB 9.0* 8.1* 9.3* 8.6* 7.1* 8.8*  HCT 28.3* 25.0* 28.7* 27.9* 21.0* 26.9*  MCV 92.2 92.3 92.9 93.3  --  92.8  PLT 455* 404* 389 391  --  973   Basic Metabolic Panel: Recent Labs  Lab 06/25/19 0430 06/25/19 1118 06/25/19 1810 06/26/19 0409 06/26/19 1050 06/26/19 1359 06/27/19 0500  NA 142 144 141 142 143 146* 141  K 5.7* 5.5* 5.3* 5.3* 5.5* 4.2 6.0*  CL 114* 115* 115* 113* 114*  --  115*  CO2 22 21* 21* 21* 21*  --  22  GLUCOSE 170* 157* 191* 115* 126*  --  112*  BUN 64* 61* 60* 58* 56*  --  65*  CREATININE 1.87* 1.80* 1.86* 1.81* 1.74*  --  1.89*  CALCIUM 7.8* 8.0* 7.8* 8.0* 8.2*  --  7.7*  MG 2.5*  --   --   --   --   --   --   PHOS 4.3  --   --   --   --   --  5.8*   GFR: Estimated Creatinine Clearance: 43.6 mL/min (A) (by C-G formula based on SCr of 1.89 mg/dL (H)). Liver Function Tests: Recent Labs  Lab 06/25/19 1810 06/27/19 0500  AST 21  --   ALT 20  --   ALKPHOS 253*  --  BILITOT 0.8  --   PROT 5.5*  --   ALBUMIN 2.2* 2.4*   No results for input(s): LIPASE, AMYLASE in the last 168 hours. No results for input(s): AMMONIA in the last 168 hours. Coagulation Profile: Recent Labs  Lab 06/22/19 0604 06/25/19 0430 06/25/19 1810  INR 1.5* 1.4* 1.4*   Cardiac Enzymes: No results for input(s): CKTOTAL, CKMB, CKMBINDEX, TROPONINI in the last 168 hours. BNP (last 3 results) No results for input(s): PROBNP in the last 8760 hours. HbA1C: No results for input(s): HGBA1C in the last 72 hours. CBG: Recent Labs  Lab 06/26/19 1552 06/26/19 2234 06/27/19 0038 06/27/19 0418 06/27/19 0815  GLUCAP 162* 153* 133* 96 97   Lipid Profile: Recent Labs    06/26/19 0409  CHOL 138  HDL 39*  LDLCALC 75  TRIG 119  CHOLHDL 3.5   Thyroid Function Tests: No results for input(s): TSH, T4TOTAL, FREET4, T3FREE, THYROIDAB in the last 72 hours. Anemia Panel: Recent Labs    06/27/19 0500  FERRITIN 384*  TIBC 192*  IRON 94   Sepsis Labs: Recent Labs  Lab  06/21/19 0552  PROCALCITON <0.10    Recent Results (from the past 240 hour(s))  SARS CORONAVIRUS 2 (TAT 6-24 HRS) Nasopharyngeal Nasopharyngeal Swab     Status: None   Collection Time: 06/19/19  6:16 PM   Specimen: Nasopharyngeal Swab  Result Value Ref Range Status   SARS Coronavirus 2 NEGATIVE NEGATIVE Final    Comment: (NOTE) SARS-CoV-2 target nucleic acids are NOT DETECTED. The SARS-CoV-2 RNA is generally detectable in upper and lower respiratory specimens during the acute phase of infection. Negative results do not preclude SARS-CoV-2 infection, do not rule out co-infections with other pathogens, and should not be used as the sole basis for treatment or other patient management decisions. Negative results must be combined with clinical observations, patient history, and epidemiological information. The expected result is Negative. Fact Sheet for Patients: SugarRoll.be Fact Sheet for Healthcare Providers: https://www.woods-mathews.com/ This test is not yet approved or cleared by the [place] FDA and  has been authorized for detection and/or diagnosis of SARS-CoV-2 by FDA under an Emergency Use Authorization (EUA). This EUA will remain  in effect (meaning this test can be used) for the duration of the COVID-19 declaration under Section 56 4(b)(1) of the Act, 21 U.S.C. section 360bbb-3(b)(1), unless the authorization is terminated or revoked sooner. Performed at [facility], [place] [address] [place], [place] [address]   CULTURE, BLOOD (ROUTINE X 2) w Reflex to ID Panel     Status: None   Collection Time: 06/22/19 12:22 AM   Specimen: BLOOD  Result Value Ref Range Status   Specimen Description BLOOD BLOOD LEFT HAND  Final   Special Requests   Final    BOTTLES DRAWN AEROBIC AND ANAEROBIC Blood Culture results may not be optimal due to an excessive volume of blood received in culture bottles   Culture   Final    NO GROWTH 5  DAYS Performed at [facility], [address] [place], [place] [address]    Report Status 06/27/2019 FINAL  Final  CULTURE, BLOOD (ROUTINE X 2) w Reflex to ID Panel     Status: None   Collection Time: 06/22/19 12:22 AM   Specimen: BLOOD  Result Value Ref Range Status   Specimen Description BLOOD RIGHT ANTECUBITAL  Final   Special Requests   Final    BOTTLES DRAWN AEROBIC AND ANAEROBIC Blood Culture adequate volume  Culture   Final    NO GROWTH 5 DAYS Performed at [facility], [address] [place], [place] [address]    Report Status 06/27/2019 FINAL  Final  Body fluid culture (includes gram stain)     Status: None   Collection Time: 06/22/19 10:18 AM   Specimen: Pleural Fluid  Result Value Ref Range Status   Specimen Description   Final    PLEURAL Performed at [facility], [address] [place], [place] [address]    Special Requests   Final    NONE Performed at [facility], [address] [place], [place] [address]    Gram Stain   Final    RARE WBC PRESENT,BOTH PMN AND MONONUCLEAR NO ORGANISMS SEEN    Culture   Final    NO GROWTH Performed at [facility], [place] [address] [place], [place] [address]    Report Status 06/25/2019 FINAL  Final  Aerobic/Anaerobic Culture (surgical/deep wound)     Status: None (Preliminary result)   Collection Time: 06/22/19 10:18 AM   Specimen: Pleural Fluid  Result Value Ref Range Status   Specimen Description   Final    PLEURAL Performed at [facility], [address], [place], [place] [address]    Special Requests   Final    Normal Performed at [facility], [address] [place], [place] [address]    Gram Stain   Final    RARE WBC PRESENT, PREDOMINANTLY MONONUCLEAR NO ORGANISMS SEEN    Culture   Final    NO GROWTH 4 DAYS NO ANAEROBES ISOLATED; CULTURE IN PROGRESS FOR 5 DAYS Performed at [facility], [place] [address] [place], [place] [address]     Report Status PENDING  Incomplete  Surgical pcr screen     Status: Abnormal   Collection Time: 06/25/19  8:39 AM   Specimen: Nasal Mucosa; Nasal Swab  Result Value Ref Range Status   MRSA, PCR NEGATIVE NEGATIVE Final   Staphylococcus aureus POSITIVE (A) NEGATIVE Final    Comment: (NOTE) The Xpert SA Assay (FDA approved for NASAL specimens in patients 52 years of age and older), is one component of a comprehensive surveillance program. It is not intended to diagnose infection nor to guide or monitor treatment. Performed at [facility], [place] [address] [place], [place] [address]   Gram stain     Status: None   Collection Time: 06/26/19  2:06 PM   Specimen: PATH Cytology Pleural fluid; Body Fluid  Result Value Ref Range Status   Specimen Description FLUID PLEURAL  Final   Special Requests NONE  Final   Gram Stain   Final    RARE WBC PRESENT, PREDOMINANTLY MONONUCLEAR NO ORGANISMS SEEN Performed at [facility], [address], [place], [place] [address]    Report Status 06/26/2019 FINAL  Final  Aerobic/Anaerobic Culture (surgical/deep wound)     Status: None (Preliminary result)   Collection Time: 06/26/19  2:12 PM   Specimen: Pleural, Left; Lung  Result Value Ref Range Status   Specimen Description FLUID LEFT PLEURAL  Final   Special Requests PEEL  Final   Gram Stain   Final    RARE WBC PRESENT, PREDOMINANTLY MONONUCLEAR NO ORGANISMS SEEN Performed at [facility], [place] [address] [place], [place] [address]    Culture PENDING  Incomplete   Report Status PENDING  Incomplete         Radiology Studies:  Dg Chest Port 1 View  Result Date: 06/27/2019 CLINICAL DATA:  Empyema status post VATS EXAM: PORTABLE CHEST 1 VIEW COMPARISON:  06/26/2019 FINDINGS: The left-sided chest tube is stable. No definite pneumothorax. No residual pleural effusion. The right IJ catheter is stable. Improved lung aeration bilaterally with clearing postoperative atelectasis.  IMPRESSION: 1. Support apparatus in good position, unchanged. 2. Improved lung aeration with clearing atelectasis. 3. No pneumothorax or recurrent effusion on the left. Electronically Signed   By: Marijo Sanes M.D.   On: 06/27/2019 08:08   Dg Chest Port 1 View  Result Date: 06/26/2019 CLINICAL DATA:  Empyema, postoperative EXAM: PORTABLE CHEST 1 VIEW COMPARISON:  06/26/2019, 7:38 a.m. FINDINGS: Interval postoperative findings of left thoracotomy with left-sided chest tube and improved aeration of the left lung base. A previously seen pigtail chest catheter about the left lung base has been removed. No significant pneumothorax. There is new atelectasis or consolidation of the inferior right upper lobe. Interval placement of a right neck vascular catheter, tip positioned near the superior cavoatrial junction. The heart mediastinum are unremarkable. IMPRESSION: 1. Interval postoperative findings of left thoracotomy with left-sided chest tube and improved aeration of the left lung base. A previously seen pigtail chest catheter about the left lung base has been removed. No significant pneumothorax. 2. There is new atelectasis or consolidation of the inferior right upper lobe. 3. Interval placement of a right neck vascular catheter, tip positioned near the superior cavoatrial junction. Electronically Signed   By: Eddie Candle M.D.   On: 06/26/2019 16:43   Dg Chest Port 1 View  Result Date: 06/26/2019 CLINICAL DATA:  Preop for video-assisted thoracic surgery. EXAM: PORTABLE CHEST 1 VIEW COMPARISON:  Radiographs of June 24, 2019. FINDINGS: Stable cardiomediastinal silhouette. No pneumothorax is noted. Improved left basilar opacity is noted suggesting improving atelectasis and effusion. Stable position of left-sided pleural drainage catheter. Right lung is unremarkable. Bony thorax is unremarkable. IMPRESSION: Stable position of left-sided pleural drainage catheter. Improved left basilar atelectasis and effusion  is noted. Electronically Signed   By: Marijo Conception M.D.   On: 06/26/2019 10:03        Scheduled Meds: . acetaminophen  1,000 mg Oral Q6H   Or  . acetaminophen (TYLENOL) oral liquid 160 mg/5 mL  1,000 mg Oral Q6H  . bisacodyl  10 mg Oral Daily  . Chlorhexidine Gluconate Cloth  6 each Topical Daily  . enoxaparin (LOVENOX) injection  40 mg Subcutaneous Daily  . insulin aspart  0-24 Units Subcutaneous Q4H  . linezolid  600 mg Oral Q12H  . senna-docusate  1 tablet Oral QHS  . sodium bicarbonate  50 mEq Intravenous Once   Continuous Infusions: . sodium chloride       LOS: 3 days    Time spent: 35 minutes    Elmarie Shiley, MD [facility] Pager [phone]  If 7PM-7AM, please contact night-coverage [email or web address] Password [email or web address] 06/27/2019, 8:51 AM

## 2019-06-27 NOTE — Op Note (Signed)
      [address]       [place] [address]             [phone]        06/27/2019  Patient:  Christia Reading A Amiri Pre-Op Dx: left hydropneumothorax   Post-op Dx:  Left empyema Procedure:  - left Video assisted thoracoscopy - decortication  Surgeon and Role:      * Mykle Pascua, Lucile Crater, MD - Primary    * T. Harriet Pho PA-C - assisting   Anesthesia  general EBL:  172ml Blood Administration: none Specimen:  Pleural fluid, pleural rind  Drains: 28 F argyle chest tube in left chest Counts: correct   Indications: 58 yo male admitted with a loculated hydropneumothorax.  Due to concern for an empyema, he was brought to the OR for surgical decortication  Findings: Fibrinopurulent empyema  Operative Technique: After the risks, benefits and alternatives were thoroughly discussed, the patient was brought to the operative theatre.  Anesthesia was induced and the patient was then placed in a lateral decubitus position and was prepped and draped in normal sterile fashion.  An appropriate surgical pause was performed, and pre-operative antibiotics were dosed accordingly.  We began with 3cm incision in the anterior axillary line at the 8th intercostal space.  The chest was entered, and we then placed a 1cm incision at the 10th intercostal space, and introduced our camera port.  The lung was directly visualized.  There was a thick rind on the lower lobe and the upper lobe was adherent to the chest wall.  The lung was mobilized off the chest wall and diaphragm.  Both lobes, and the fissure was decorticated.  The pleural space was then copiously irrigated.  A 72F chest with then placed, and we watch the remaining lobes re-expand.  The skin and soft tissue were closed with absorbable suture    The patient tolerated the procedure without any immediate complications, and was transferred to the PACU in stable condition.  Geoge Lawrance Bary Leriche

## 2019-06-28 ENCOUNTER — Inpatient Hospital Stay (HOSPITAL_COMMUNITY): Payer: Self-pay

## 2019-06-28 DIAGNOSIS — N179 Acute kidney failure, unspecified: Secondary | ICD-10-CM

## 2019-06-28 LAB — URINALYSIS, ROUTINE W REFLEX MICROSCOPIC
Bilirubin Urine: NEGATIVE
Glucose, UA: NEGATIVE mg/dL
Ketones, ur: NEGATIVE mg/dL
Leukocytes,Ua: NEGATIVE
Nitrite: NEGATIVE
Protein, ur: 30 mg/dL — AB
Specific Gravity, Urine: 1.009 (ref 1.005–1.030)
pH: 5 (ref 5.0–8.0)

## 2019-06-28 LAB — CBC
HCT: 24.1 % — ABNORMAL LOW (ref 39.0–52.0)
Hemoglobin: 7.8 g/dL — ABNORMAL LOW (ref 13.0–17.0)
MCH: 29.8 pg (ref 26.0–34.0)
MCHC: 32.4 g/dL (ref 30.0–36.0)
MCV: 92 fL (ref 80.0–100.0)
Platelets: 235 10*3/uL (ref 150–400)
RBC: 2.62 MIL/uL — ABNORMAL LOW (ref 4.22–5.81)
RDW: 13.4 % (ref 11.5–15.5)
WBC: 8.6 10*3/uL (ref 4.0–10.5)
nRBC: 0 % (ref 0.0–0.2)

## 2019-06-28 LAB — HEPATIC FUNCTION PANEL
ALT: 37 U/L (ref 0–44)
AST: 35 U/L (ref 15–41)
Albumin: 2.4 g/dL — ABNORMAL LOW (ref 3.5–5.0)
Alkaline Phosphatase: 208 U/L — ABNORMAL HIGH (ref 38–126)
Bilirubin, Direct: 0.1 mg/dL (ref 0.0–0.2)
Indirect Bilirubin: 0.6 mg/dL (ref 0.3–0.9)
Total Bilirubin: 0.7 mg/dL (ref 0.3–1.2)
Total Protein: 5.1 g/dL — ABNORMAL LOW (ref 6.5–8.1)

## 2019-06-28 LAB — RENAL FUNCTION PANEL
Albumin: 2.3 g/dL — ABNORMAL LOW (ref 3.5–5.0)
Anion gap: 9 (ref 5–15)
BUN: 71 mg/dL — ABNORMAL HIGH (ref 6–20)
CO2: 24 mmol/L (ref 22–32)
Calcium: 7.8 mg/dL — ABNORMAL LOW (ref 8.9–10.3)
Chloride: 110 mmol/L (ref 98–111)
Creatinine, Ser: 2.12 mg/dL — ABNORMAL HIGH (ref 0.61–1.24)
GFR calc Af Amer: 39 mL/min — ABNORMAL LOW (ref >60)
GFR calc non Af Amer: 34 mL/min — ABNORMAL LOW (ref >60)
Glucose, Bld: 99 mg/dL (ref 70–99)
Phosphorus: 4.7 mg/dL — ABNORMAL HIGH (ref 2.5–4.6)
Potassium: 4.7 mmol/L (ref 3.5–5.1)
Sodium: 143 mmol/L (ref 135–145)

## 2019-06-28 LAB — GLUCOSE, CAPILLARY
Glucose-Capillary: 92 mg/dL (ref 70–99)
Glucose-Capillary: 145 mg/dL — ABNORMAL HIGH (ref 70–99)
Glucose-Capillary: 191 mg/dL — ABNORMAL HIGH (ref 70–99)
Glucose-Capillary: 150 mg/dL — ABNORMAL HIGH (ref 70–99)

## 2019-06-28 MED ORDER — SODIUM ZIRCONIUM CYCLOSILICATE 10 G PO PACK
10.0000 g | PACK | Freq: Every day | ORAL | Status: DC
  Filled 2019-06-28: qty 1

## 2019-06-28 MED ORDER — FERROUS SULFATE 325 (65 FE) MG PO TABS
325.0000 mg | ORAL_TABLET | Freq: Two times a day (BID) | ORAL | Status: DC
  Administered 2019-06-28 – 2019-06-29 (×4): 325 mg via ORAL
  Filled 2019-06-28 (×5): qty 1

## 2019-06-28 NOTE — Progress Notes (Signed)
Progress Note  Patient Name: Nathan [name] Date of Encounter: 06/28/2019  Primary Cardiologist: Ida Rogue, MD   Subjective   Denies any SOB. Did not get much sleep because of pain from chest tube.   Inpatient Medications    Scheduled Meds: . acetaminophen  1,000 mg Oral Q6H   Or  . acetaminophen (TYLENOL) oral liquid 160 mg/5 mL  1,000 mg Oral Q6H  . bisacodyl  10 mg Oral Daily  . Chlorhexidine Gluconate Cloth  6 each Topical Daily  . enoxaparin (LOVENOX) injection  40 mg Subcutaneous Daily  . ferrous sulfate  325 mg Oral BID WC  . furosemide  40 mg Intravenous Q12H  . insulin aspart  0-24 Units Subcutaneous TID AC & HS  . linezolid  600 mg Oral Q12H  . senna-docusate  1 tablet Oral QHS  . sodium zirconium cyclosilicate  10 g Oral TID   Continuous Infusions:  PRN Meds: morphine injection, ondansetron (ZOFRAN) IV, traMADol   Vital Signs    Vitals:   06/27/19 1212 06/27/19 2152 06/28/19 0316 06/28/19 0400  BP: 126/69 (!) 142/78  136/76  Pulse: 61 77 81 78  Resp: 11 20 11 12   Temp:   98.5 F (36.9 C) 98.2 F (36.8 C)  TempSrc:   Oral Oral  SpO2: 99% 98% 92% 90%  Weight:        Intake/Output Summary (Last 24 hours) at 06/28/2019 0905 Last data filed at 06/28/2019 0716 Gross per 24 hour  Intake 500 ml  Output 180 ml  Net 320 ml   Last 3 Weights 06/26/2019 06/24/2019 06/23/2019  Weight (lbs) 170 lb 3.1 oz 168 lb 8 oz 165 lb 4.8 oz  Weight (kg) 77.2 kg 76.431 kg 74.98 kg      Telemetry    NSR with short burst of afib vs atrial tach last night.  - Personally Reviewed  ECG    NSR with TWI in inferior leads - Personally Reviewed  Physical Exam   GEN: No acute distress.   Neck: No JVD Cardiac: RRR, no murmurs, rubs, or gallops.  Respiratory: Clear to auscultation bilaterally. L side chest tube in place, serosanguinous drainage GI: Soft, nontender, non-distended  MS: No edema; No deformity. Neuro:  Nonfocal  Psych: Normal affect   Labs     High Sensitivity Troponin:   Recent Labs  Lab 06/19/19 1349 06/19/19 1638  TROPONINIHS 30* 29*      Chemistry Recent Labs  Lab 06/25/19 1810  06/27/19 0500  06/27/19 1215 06/27/19 1734 06/28/19 0320  NA 141   < > 141   < > 144 143 143  K 5.3*   < > 6.0*   < > 5.9* 5.3* 4.7  CL 115*   < > 115*   < > 114* 112* 110  CO2 21*   < > 22   < > 22 22 24   GLUCOSE 191*   < > 112*   < > 178* 147* 99  BUN 60*   < > 65*   < > 68* 69* 71*  CREATININE 1.86*   < > 1.89*   < > 2.10* 2.23* 2.12*  CALCIUM 7.8*   < > 7.7*   < > 7.7* 8.0* 7.8*  PROT 5.5*  --   --   --   --   --  5.1*  ALBUMIN 2.2*  --  2.4*  --   --   --  2.4*  2.3*  AST 21  --   --   --   --   --  35  ALT 20  --   --   --   --   --  37  ALKPHOS 253*  --   --   --   --   --  208*  BILITOT 0.8  --   --   --   --   --  0.7  GFRNONAA 40*   < > 39*   < > 34* 32* 34*  GFRAA 46*   < > 45*   < > 40* 37* 39*  ANIONGAP 5   < > 4*   < > 8 9 9    < > = values in this interval not displayed.     Hematology Recent Labs  Lab 06/26/19 0409 06/26/19 1359 06/27/19 0500 06/28/19 0320  WBC 6.6  --  10.4 8.6  RBC 2.99*  --  2.90* 2.62*  HGB 8.6* 7.1* 8.8* 7.8*  HCT 27.9* 21.0* 26.9* 24.1*  MCV 93.3  --  92.8 92.0  MCH 28.8  --  30.3 29.8  MCHC 30.8  --  32.7 32.4  RDW 13.2  --  13.2 13.4  PLT 391  --  311 235    BNPNo results for input(s): BNP, PROBNP in the last 168 hours.   DDimer No results for input(s): DDIMER in the last 168 hours.   Radiology    US Renal  Result Date: 06/27/2019 CLINICAL DATA:  Hypertension.  Diabetes.  Acute kidney injury. EXAM: RENAL / URINARY TRACT ULTRASOUND COMPLETE COMPARISON:  None. FINDINGS: Right Kidney: Renal measurements: 10.8 x 6.5 x 6.1 cm = volume: 243 mL . Echogenicity within normal limits. No mass or hydronephrosis visualized. Left Kidney: Renal measurements: 10.8 x 6.1 x 5.2 cm = volume: 177 mL. Echogenicity within normal limits. No mass or hydronephrosis visualized. Bladder: Decompressed.  Note: Ascites and right pleural effusion evident. IMPRESSION: 1. No hydronephrosis. 2. Ascites with right pleural effusion. Electronically Signed   By: Misty Stanley M.D.   On: 06/27/2019 16:31   Dg Chest Port 1 View  Result Date: 06/27/2019 CLINICAL DATA:  Empyema status post VATS EXAM: PORTABLE CHEST 1 VIEW COMPARISON:  06/26/2019 FINDINGS: The left-sided chest tube is stable. No definite pneumothorax. No residual pleural effusion. The right IJ catheter is stable. Improved lung aeration bilaterally with clearing postoperative atelectasis. IMPRESSION: 1. Support apparatus in good position, unchanged. 2. Improved lung aeration with clearing atelectasis. 3. No pneumothorax or recurrent effusion on the left. Electronically Signed   By: Marijo Sanes M.D.   On: 06/27/2019 08:08   Dg Chest Port 1 View  Result Date: 06/26/2019 CLINICAL DATA:  Empyema, postoperative EXAM: PORTABLE CHEST 1 VIEW COMPARISON:  06/26/2019, 7:38 a.m. FINDINGS: Interval postoperative findings of left thoracotomy with left-sided chest tube and improved aeration of the left lung base. A previously seen pigtail chest catheter about the left lung base has been removed. No significant pneumothorax. There is new atelectasis or consolidation of the inferior right upper lobe. Interval placement of a right neck vascular catheter, tip positioned near the superior cavoatrial junction. The heart mediastinum are unremarkable. IMPRESSION: 1. Interval postoperative findings of left thoracotomy with left-sided chest tube and improved aeration of the left lung base. A previously seen pigtail chest catheter about the left lung base has been removed. No significant pneumothorax. 2. There is new atelectasis or consolidation of the inferior right upper lobe. 3. Interval placement of a right neck vascular catheter, tip positioned near the superior cavoatrial junction. Electronically Signed   By: Eddie Candle  M.D.   On: 06/26/2019 16:43    Cardiac Studies    Echo 06/13/2019  IMPRESSIONS    1. Left ventricular ejection fraction, by visual estimation, is 45 to 50%. The left ventricle has mildly decreased function. Normal left ventricular size. There is no left ventricular hypertrophy.  2. Global right ventricle has normal systolic function.The right ventricular size is normal. No increase in right ventricular wall thickness.  3. Left atrial size was normal.  4. Right atrial size was normal.  5. The mitral valve is normal in structure. Mild mitral valve regurgitation. No evidence of mitral stenosis.  6. The tricuspid valve is normal in structure. Tricuspid valve regurgitation mild-moderate.  7. The aortic valve is normal in structure. Aortic valve regurgitation was not visualized by color flow Doppler. Structurally normal aortic valve, with no evidence of sclerosis or stenosis.  8. The pulmonic valve was normal in structure. Pulmonic valve regurgitation is not visualized by color flow Doppler.  9. Mildly elevated pulmonary artery systolic pressure. 10. The inferior vena cava is normal in size with greater than 50% respiratory variability, suggesting right atrial pressure of 3 mmHg.  Patient Profile     58 y.o. male with PMH of HTN, DM II, PAF and chronic systolic CHF who had recent admission for infected R thumb and sepsis presented back to the hospital with LE edema and SOB. CT demonstrated loculated pleural effusion as well as small pericardial effusion. Chest tube placed on 10/2, followup CXR unchanged, patient was transferred to [facility] for VATS. Repeat CT of chest 10/4 showed hydropneumothorax and R pleural effusion. Cardiology consulted for preop clearance.   Assessment & Plan    1. Acute on chronic systolic heart failure  - recent echo obtained on 9/23 showed EF 45-50%. Will need outpatient ischemic workup  - diuretic managed by nephrology  2. Left hydropneumothorax: s/p VATS on 10/6, chest tube in place.   3. R hand cellulitis with MRSA  bacteremia: recently treated at [facility]. On Abx  4. PAF  - continue metoprolol. He stopped eliquis due to financial issue, given worsening renal function, consider switch eliquis to coumadin? Case manager to see if patient qualify for medication assistance. He says eliquis cost $500  5. HTN: BP stable  6. DM II  7. Acute on CKD stage III: seen by nephrology, felt deterioration maybe related to recent infection  8. Anemia of chronic disease      For questions or updates, please contact [place] Please consult [email or web address] for contact info under        Signed, Almyra Deforest, [place]  06/28/2019, 9:05 AM

## 2019-06-28 NOTE — Progress Notes (Signed)
      [address]       [place], [address]             [phone]      2 Days Post-Op Procedure(s) (LRB): VIDEO ASSISTED THORACOSCOPY DECORTICATION (Left) VIDEO BRONCHOSCOPY (N/A) Subjective: Feels good today and asking when he can go home.  Objective: Vital signs in last 24 hours: Temp:  [98.2 F (36.8 C)-98.5 F (36.9 C)] 98.2 F (36.8 C) (10/08 0400) Pulse Rate:  [56-81] 78 (10/08 0400) Cardiac Rhythm: Normal sinus rhythm (10/07 1900) Resp:  [11-20] 12 (10/08 0400) BP: (126-142)/(69-78) 136/76 (10/08 0400) SpO2:  [90 %-100 %] 90 % (10/08 0400)    Intake/Output from previous day: 10/07 0701 - 10/08 0700 In: 500 [P.O.:500] Out: 130 [Chest Tube:130] Intake/Output this shift: Total I/O In: -  Out: 50 [Chest Tube:50]  General appearance: alert, cooperative and no distress Heart: regular rate and rhythm, S1, S2 normal, no murmur, click, rub or gallop Lungs: clear to auscultation bilaterally Abdomen: soft, non-tender; bowel sounds normal; no masses,  no organomegaly Extremities: 1-2+ edema in lower extremity Wound: clean and dry  Lab Results: Recent Labs    06/27/19 0500 06/28/19 0320  WBC 10.4 8.6  HGB 8.8* 7.8*  HCT 26.9* 24.1*  PLT 311 235   BMET:  Recent Labs    06/27/19 1734 06/28/19 0320  NA 143 143  K 5.3* 4.7  CL 112* 110  CO2 22 24  GLUCOSE 147* 99  BUN 69* 71*  CREATININE 2.23* 2.12*  CALCIUM 8.0* 7.8*    PT/INR:  Recent Labs    06/25/19 1810  LABPROT 16.5*  INR 1.4*   ABG    Component Value Date/Time   PHART 7.313 (L) 06/27/2019 0440   HCO3 21.3 06/27/2019 0440   TCO2 18 (L) 06/26/2019 1359   ACIDBASEDEF 3.9 (H) 06/27/2019 0440   O2SAT 97.9 06/27/2019 0440   CBG (last 3)  Recent Labs    06/27/19 1610 06/27/19 2148 06/28/19 0641  GLUCAP 129* 108* 92    Assessment/Plan: S/P Procedure(s) (LRB): VIDEO ASSISTED THORACOSCOPY DECORTICATION (Left) VIDEO BRONCHOSCOPY (N/A)  1. Hemodynamically stable and  in NSR. BP well controlled. Start Eliquis today? 2. Pulm- tolerating room air with good oxygen saturation. Chest tubes only put out 50cc/24 hours. Will leave in one more day. No CXR this morning so will order.   3. Renal- baseline CKD. Nephro following and assisting  4. H and H 7.8/24.1-decrease from yesterday. Not much chest tube output.  5. Blood glucose well controlled.  Plan: Keep chest tube for now-likely remove tomorrow. Will hold off on starting Eliquis due to the drop in h&h. H and H in the morning. Remove central line.    LOS: 4 days    Elgie Collard 06/28/2019

## 2019-06-28 NOTE — Progress Notes (Signed)
Subjective:  No UOP recorded until this AM- 500 ccs, foley catheter out - crt stable at 2.1-  Hyperkalemia resolved with scheduled lokelma- BP is higher (better)    Objective Vital signs in last 24 hours: Vitals:   06/27/19 1212 06/27/19 2152 06/28/19 0316 06/28/19 0400  BP: 126/69 (!) 142/78  136/76  Pulse: 61 77 81 78  Resp: 11 20 11 12   Temp:   98.5 F (36.9 C) 98.2 F (36.8 C)  TempSrc:   Oral Oral  SpO2: 99% 98% 92% 90%  Weight:       Weight change:   Intake/Output Summary (Last 24 hours) at 06/28/2019 1413 Last data filed at 06/28/2019 0947 Gross per 24 hour  Intake 180 ml  Output 680 ml  Net -500 ml   Assessment/Plan: 58 year old male with  diabetes, hypertension-both of which are under poor control.  Likely has some baseline CKD-even though creatinine in June appeared normal it is an outlier.  Over the last 2 weeks he has had a prolonged hospitalization with infectious complications.  Creatinine is possibly above baseline but had been stable and indicated a GFR in the 40s 1.Renal-possibly some baseline CKD with creatinine in December 2019 1.6.  True that there is a creatinine in the system of 1.1 in June however that is the outlier.  Since hospitalized with infectious complications including a MRSA bacteremia-patient has had worsening of his CKD, however creatinine had been fairly stable in the 1.6-1.8 range indicating a GFR in the 40s until the VATS.  Now with A on CRF in the setting of post op from VATS- crt over 2.  No obvious hemodynamic issue but still suspect is the cause-  U/A is bland- hopefully showing signs of plateauing before improvement 2. Hypertension/volume  -blood pressure better, even low post op.  Was on Toprol but appears to have been stopped.  Likely would be a candidate for ACE or ARB but would not start it until outpatient setting and without hyperkalemia.  He does appear to be slightly volume overloaded but also with a low albumin so is likely third spacing.   I do not suspect that he needs major diuresis at this point but with the low UOP and high K - I will start oral Lasix -  Has possibly had some impact, to continue  3.  Hyperkalemia-mild and handled by the [facility].  I would continue to use that as needed- have added daily- actually TID  given spike of potassium yesterday but now resolved, will dec back to qd 4. Anemia  -probably mostly due to hospitalization but could have some anemia of CKD.   iron stores ok,  Got a transfusion of one unit 10/6- trending down today     Nathan [name]    Labs: Basic Metabolic Panel: Recent Labs  Lab 06/25/19 0430  06/27/19 0500  06/27/19 1215 06/27/19 1734 06/28/19 0320  NA 142   < > 141   < > 144 143 143  K 5.7*   < > 6.0*   < > 5.9* 5.3* 4.7  CL 114*   < > 115*   < > 114* 112* 110  CO2 22   < > 22   < > 22 22 24   GLUCOSE 170*   < > 112*   < > 178* 147* 99  BUN 64*   < > 65*   < > 68* 69* 71*  CREATININE 1.87*   < > 1.89*   < > 2.10* 2.23*  2.12*  CALCIUM 7.8*   < > 7.7*   < > 7.7* 8.0* 7.8*  PHOS 4.3  --  5.8*  --   --   --  4.7*   < > = values in this interval not displayed.   Liver Function Tests: Recent Labs  Lab 06/25/19 1810 06/27/19 0500 06/28/19 0320  AST 21  --  35  ALT 20  --  37  ALKPHOS 253*  --  208*  BILITOT 0.8  --  0.7  PROT 5.5*  --  5.1*  ALBUMIN 2.2* 2.4* 2.4*  2.3*   No results for input(s): LIPASE, AMYLASE in the last 168 hours. No results for input(s): AMMONIA in the last 168 hours. CBC: Recent Labs  Lab 06/24/19 0707 06/25/19 0430 06/25/19 1810 06/26/19 0409 06/26/19 1359 06/27/19 0500 06/28/19 0320  WBC 6.7 7.0 8.2 6.6  --  10.4 8.6  NEUTROABS 5.1  --   --   --   --   --   --   HGB 9.0* 8.1* 9.3* 8.6* 7.1* 8.8* 7.8*  HCT 28.3* 25.0* 28.7* 27.9* 21.0* 26.9* 24.1*  MCV 92.2 92.3 92.9 93.3  --  92.8 92.0  PLT 455* 404* 389 391  --  311 235   Cardiac Enzymes: No results for input(s): CKTOTAL, CKMB, CKMBINDEX, TROPONINI in the last 168  hours. CBG: Recent Labs  Lab 06/27/19 1153 06/27/19 1610 06/27/19 2148 06/28/19 0641 06/28/19 1132  GLUCAP 170* 129* 108* 92 145*    Iron Studies:  Recent Labs    06/27/19 0500  IRON 94  TIBC 192*  FERRITIN 384*   Studies/Results: US Renal  Result Date: 06/27/2019 CLINICAL DATA:  Hypertension.  Diabetes.  Acute kidney injury. EXAM: RENAL / URINARY TRACT ULTRASOUND COMPLETE COMPARISON:  None. FINDINGS: Right Kidney: Renal measurements: 10.8 x 6.5 x 6.1 cm = volume: 243 mL . Echogenicity within normal limits. No mass or hydronephrosis visualized. Left Kidney: Renal measurements: 10.8 x 6.1 x 5.2 cm = volume: 177 mL. Echogenicity within normal limits. No mass or hydronephrosis visualized. Bladder: Decompressed. Note: Ascites and right pleural effusion evident. IMPRESSION: 1. No hydronephrosis. 2. Ascites with right pleural effusion. Electronically Signed   By: Misty Stanley M.D.   On: 06/27/2019 16:31   Dg Chest Port 1 View  Result Date: 06/28/2019 CLINICAL DATA:  Empyema status post decortication and chest tube placement. EXAM: PORTABLE CHEST 1 VIEW COMPARISON:  Chest x-ray 06/27/2019 and chest CT 06/24/2019 FINDINGS: The left-sided chest tube is stable. No pneumothorax. Small amount of residual subcutaneous emphysema. The right IJ catheter is in good position, unchanged. The cardiac silhouette, mediastinal and hilar contours are within normal limits and stable. Stable small left effusion and left lower lobe atelectasis/infiltrate. The right lung is clear. IMPRESSION: 1. Stable left-sided chest tube without pneumothorax. 2. Persistent small left effusion and left lower lobe atelectasis/infiltrate. Electronically Signed   By: Marijo Sanes M.D.   On: 06/28/2019 10:45   Dg Chest Port 1 View  Result Date: 06/27/2019 CLINICAL DATA:  Empyema status post VATS EXAM: PORTABLE CHEST 1 VIEW COMPARISON:  06/26/2019 FINDINGS: The left-sided chest tube is stable. No definite pneumothorax. No residual  pleural effusion. The right IJ catheter is stable. Improved lung aeration bilaterally with clearing postoperative atelectasis. IMPRESSION: 1. Support apparatus in good position, unchanged. 2. Improved lung aeration with clearing atelectasis. 3. No pneumothorax or recurrent effusion on the left. Electronically Signed   By: Marijo Sanes M.D.   On: 06/27/2019 08:08  Dg Chest Port 1 View  Result Date: 06/26/2019 CLINICAL DATA:  Empyema, postoperative EXAM: PORTABLE CHEST 1 VIEW COMPARISON:  06/26/2019, 7:38 a.m. FINDINGS: Interval postoperative findings of left thoracotomy with left-sided chest tube and improved aeration of the left lung base. A previously seen pigtail chest catheter about the left lung base has been removed. No significant pneumothorax. There is new atelectasis or consolidation of the inferior right upper lobe. Interval placement of a right neck vascular catheter, tip positioned near the superior cavoatrial junction. The heart mediastinum are unremarkable. IMPRESSION: 1. Interval postoperative findings of left thoracotomy with left-sided chest tube and improved aeration of the left lung base. A previously seen pigtail chest catheter about the left lung base has been removed. No significant pneumothorax. 2. There is new atelectasis or consolidation of the inferior right upper lobe. 3. Interval placement of a right neck vascular catheter, tip positioned near the superior cavoatrial junction. Electronically Signed   By: Eddie Candle M.D.   On: 06/26/2019 16:43   Medications: Infusions:   Scheduled Medications: . acetaminophen  1,000 mg Oral Q6H   Or  . acetaminophen (TYLENOL) oral liquid 160 mg/5 mL  1,000 mg Oral Q6H  . bisacodyl  10 mg Oral Daily  . Chlorhexidine Gluconate Cloth  6 each Topical Daily  . enoxaparin (LOVENOX) injection  40 mg Subcutaneous Daily  . ferrous sulfate  325 mg Oral BID WC  . furosemide  40 mg Intravenous Q12H  . insulin aspart  0-24 Units Subcutaneous TID  AC & HS  . linezolid  600 mg Oral Q12H  . senna-docusate  1 tablet Oral QHS  . sodium zirconium cyclosilicate  10 g Oral TID    have reviewed scheduled and prn medications.  Physical Exam: General: alert, sitting at bedside chair  no c/o's  Heart: RRR Lungs: mostly clear- CT in place Abdomen: soft, non tender Extremities: 1 + pitting edema to LE's     06/28/2019,2:13 PM  LOS: 4 days

## 2019-06-28 NOTE — Progress Notes (Signed)
Attempted left upper extremity venous duplex, however pt is in the chair. Will attempt again later as schedule permits.  06/28/2019 2:08 PM Maudry Mayhew, MHA, RVT, RDCS, RDMS

## 2019-06-28 NOTE — Progress Notes (Addendum)
PROGRESS NOTE    Nathan [name]  [id number] DOB: [birth year] DOA: 06/24/2019 PCP: Derinda Late, MD   Brief Narrative: 58 year old with past medical history significant for insulin-dependent diabetes, hypertension, A. fib on Eliquis noncompliant due to affordability issues, anemia of chronic disease, chronic kidney disease a stage III, right thumb cellulitis status post IND and MRSA bacteremia on Zyvox is a direct transfer from [facility] for concern of left hydropneumothorax.  Patient admitted on 06/19/2019 due to fluid overload anasarca his x-ray showed infiltrates and BNP was elevated.  Patient is started on antibiotics for concern of healthcare associated pneumonia vancomycin and Rocephin was started initially which was changed to Zyvox as per ID recommendation.  Patient was given IV Lasix for concern of acute on chronic CHF.  COVID-19 was negative.  Patient remained afebrile, no leukocytosis.  A CT chest showed collection of the left side  that could be empyema with a right upper lobe possible infiltrates.  Patient had left chest catheter placement by IR on 06/22/2019.   No real change on x-ray after placement of pigtail.  Dr. Adora Fridge discussed with Dr. Servando Snare thoracic surgery who recommended transfer to [facility] for possible VATS.  Repeat CT chest from 10//2020 showed moderate loculated left hydropneumothorax and a small to moderate right pleural effusion not significantly changed in volume compared to prior examination. Patient underwent VATS procedure on 10/6   Assessment & Plan:   Principal Problem:   Hydropneumothorax Active Problems:   CKD (chronic kidney disease), stage III   Hypertension   Insulin dependent diabetes mellitus type IA (HCC)   Anemia of chronic disease   Atrial fibrillation, chronic (HCC)   Acute on chronic combined systolic and diastolic CHF (congestive heart failure) (HCC)   Cellulitis of right hand   1-Left side  hydropneumothorax, complex left side pleural effusion/empyema -Noted on CT. patient was transferred from [facility] to [facility] for VATS procedure. -Patient status post pigtail catheter placement by IR on 06/22/2019 at [facility]. -Patient underwent VATS on 06/26/2019 -CVTS recommend removing chest tube probably tomorrow.  -plan to resume eliquis tomorrow.  -follow recommendation for antibiotics tx length.   2-Acute systolic heart failure: Patient presented with lower extremity edema and elevated BNP on 06/19/2019 -Echocardiogram show ejection fraction 40 to 45%.  TEE ejection fraction 50 to 55% -Patient received IV Lasix at [facility], currently on hold due to AKI -Continue to monitor output  -defer lasix to nephrology.   3-Hyperkalemia: Received  an amp of bicarb and Kayexalate one-time 1. Trending down, today at 4.7.  On Lokelma TID per nephrology.   4-AKI on chronic kidney disease 3: Possible AKI, possible related to medication antibiotics. While at [facility] patient creatinine range 1.4-1.6. Creatinine was 1.25 August 2018 however improved with March and June 2020. Renal US negative for hydronephrosis.  Cr today stable at 2. o  5-History of right hand cellulitis with MRSA bacteremia MRSA abscess of the right thumb: -Infectious disease was following patient at [facility]. -Blood cultures from [facility] 06/18/2019 MRSA positive wound culture 9/21 MRSA positive. -Subsequent culture has been negative. -He was placed on Zyvox 600 mg twice daily with his last dose 06/30/2019 -TEE was negative during previous hospitalization.  6-A. fib, paroxysmal: Patient was on Eliquis, this was restarted at [facility]. Currently he is in sinus rhythm Continue with Lopressor. He was on Eliquis, I have ask CVTS to comment on when to resume Eliquis.  Per CVTS, plan to resume eliquis 10/09 if hb is  stable.   7-Hypertension: Continue to hold ACE  8-Diabetes type 2: Continue sliding scale insulin   9-Left upper  extremity edema: Doppler ordered  10-Anemia;  Follow trend.  Mild drop.  Start ferrous sulfate.   Estimated body mass index is 25.13 kg/m as calculated from the following:   Height as of 06/19/19: 5\' 9"  (1.753 m).   Weight as of this encounter: 77.2 kg.   DVT prophylaxis: Lovenox Code Status: Full code Family Communication: care discussed with patient.  Disposition Plan: remain in patient for treatment of hydropneumothorax, hyperkalemia. AKI.  Consultants:   CVTS  Cardiology  Nephrology  Procedures:   Doppler Upper extre;  Renal US.   Antimicrobials:  Zyvox  Subjective: Breathing ok. Had BM.    Objective: Vitals:   06/27/19 1212 06/27/19 2152 06/28/19 0316 06/28/19 0400  BP: 126/69 (!) 142/78  136/76  Pulse: 61 77 81 78  Resp: 11 20 11 12   Temp:   98.5 F (36.9 C) 98.2 F (36.8 C)  TempSrc:   Oral Oral  SpO2: 99% 98% 92% 90%  Weight:        Intake/Output Summary (Last 24 hours) at 06/28/2019 0851 Last data filed at 06/28/2019 0716 Gross per 24 hour  Intake 500 ml  Output 180 ml  Net 320 ml   Filed Weights   06/26/19 0500  Weight: 77.2 kg    Examination:  General exam: NAD  Respiratory system: Crackles bases Cardiovascular system: S 1, S 2 RRR Gastrointestinal system: BS present, soft, nt Central nervous system: alert, non focal.  Extremities: Symmetric power.  Skin: no rashes.    Data Reviewed: I have personally reviewed following labs and imaging studies  CBC: Recent Labs  Lab 06/24/19 0707 06/25/19 0430 06/25/19 1810 06/26/19 0409 06/26/19 1359 06/27/19 0500 06/28/19 0320  WBC 6.7 7.0 8.2 6.6  --  10.4 8.6  NEUTROABS 5.1  --   --   --   --   --   --   HGB 9.0* 8.1* 9.3* 8.6* 7.1* 8.8* 7.8*  HCT 28.3* 25.0* 28.7* 27.9* 21.0* 26.9* 24.1*  MCV 92.2 92.3 92.9 93.3  --  92.8 92.0  PLT 455* 404* 389 391  --  311 [id number]   Basic Metabolic Panel: Recent Labs  Lab 06/25/19 0430  06/27/19 0500 06/27/19 0824 06/27/19  1215 06/27/19 1734 06/28/19 0320  NA 142   < > 141 142 144 143 143  K 5.7*   < > 6.0* 6.0* 5.9* 5.3* 4.7  CL 114*   < > 115* 116* 114* 112* 110  CO2 22   < > 22 22 22 22 24   GLUCOSE 170*   < > 112* 111* 178* 147* 99  BUN 64*   < > 65* 67* 68* 69* 71*  CREATININE 1.87*   < > 1.89* 1.97* 2.10* 2.23* 2.12*  CALCIUM 7.8*   < > 7.7* 7.6* 7.7* 8.0* 7.8*  MG 2.5*  --   --   --   --   --   --   PHOS 4.3  --  5.8*  --   --   --  4.7*   < > = values in this interval not displayed.   GFR: Estimated Creatinine Clearance: 38.9 mL/min (A) (by C-G formula based on SCr of 2.12 mg/dL (H)). Liver Function Tests: Recent Labs  Lab 06/25/19 1810 06/27/19 0500 06/28/19 0320  AST 21  --  35  ALT 20  --  37  ALKPHOS 253*  --  208*  BILITOT 0.8  --  0.7  PROT 5.5*  --  5.1*  ALBUMIN 2.2* 2.4* 2.4*  2.3*   No results for input(s): LIPASE, AMYLASE in the last 168 hours. No results for input(s): AMMONIA in the last 168 hours. Coagulation Profile: Recent Labs  Lab 06/22/19 0604 06/25/19 0430 06/25/19 1810  INR 1.5* 1.4* 1.4*   Cardiac Enzymes: No results for input(s): CKTOTAL, CKMB, CKMBINDEX, TROPONINI in the last 168 hours. BNP (last 3 results) No results for input(s): PROBNP in the last 8760 hours. HbA1C: No results for input(s): HGBA1C in the last 72 hours. CBG: Recent Labs  Lab 06/27/19 0815 06/27/19 1153 06/27/19 1610 06/27/19 2148 06/28/19 0641  GLUCAP 97 170* 129* 108* 92   Lipid Profile: Recent Labs    06/26/19 0409  CHOL 138  HDL 39*  LDLCALC 75  TRIG 119  CHOLHDL 3.5   Thyroid Function Tests: No results for input(s): TSH, T4TOTAL, FREET4, T3FREE, THYROIDAB in the last 72 hours. Anemia Panel: Recent Labs    06/27/19 0500  FERRITIN 384*  TIBC 192*  IRON 94   Sepsis Labs: No results for input(s): PROCALCITON, LATICACIDVEN in the last 168 hours.  Recent Results (from the past 240 hour(s))  SARS CORONAVIRUS 2 (TAT 6-24 HRS) Nasopharyngeal Nasopharyngeal Swab      Status: None   Collection Time: 06/19/19  6:16 PM   Specimen: Nasopharyngeal Swab  Result Value Ref Range Status   SARS Coronavirus 2 NEGATIVE NEGATIVE Final    Comment: (NOTE) SARS-CoV-2 target nucleic acids are NOT DETECTED. The SARS-CoV-2 RNA is generally detectable in upper and lower respiratory specimens during the acute phase of infection. Negative results do not preclude SARS-CoV-2 infection, do not rule out co-infections with other pathogens, and should not be used as the sole basis for treatment or other patient management decisions. Negative results must be combined with clinical observations, patient history, and epidemiological information. The expected result is Negative. Fact Sheet for Patients: SugarRoll.be Fact Sheet for Healthcare Providers: https://www.woods-mathews.com/ This test is not yet approved or cleared by the [place] FDA and  has been authorized for detection and/or diagnosis of SARS-CoV-2 by FDA under an Emergency Use Authorization (EUA). This EUA will remain  in effect (meaning this test can be used) for the duration of the COVID-19 declaration under Section 56 4(b)(1) of the Act, 21 U.S.C. section 360bbb-3(b)(1), unless the authorization is terminated or revoked sooner. Performed at [facility], [place] [address], [place], [place] [address]   CULTURE, BLOOD (ROUTINE X 2) w Reflex to ID Panel     Status: None   Collection Time: 06/22/19 12:22 AM   Specimen: BLOOD  Result Value Ref Range Status   Specimen Description BLOOD BLOOD LEFT HAND  Final   Special Requests   Final    BOTTLES DRAWN AEROBIC AND ANAEROBIC Blood Culture results may not be optimal due to an excessive volume of blood received in culture bottles   Culture   Final    NO GROWTH 5 DAYS Performed at [facility], [address] [place], [place] [address]    Report Status 06/27/2019 FINAL  Final  CULTURE, BLOOD (ROUTINE  X 2) w Reflex to ID Panel     Status: None   Collection Time: 06/22/19 12:22 AM   Specimen: BLOOD  Result Value Ref Range Status   Specimen Description BLOOD RIGHT ANTECUBITAL  Final   Special Requests   Final    BOTTLES DRAWN AEROBIC AND  ANAEROBIC Blood Culture adequate volume   Culture   Final    NO GROWTH 5 DAYS Performed at [facility], [address] [place], [place] [address]    Report Status 06/27/2019 FINAL  Final  Body fluid culture (includes gram stain)     Status: None   Collection Time: 06/22/19 10:18 AM   Specimen: Pleural Fluid  Result Value Ref Range Status   Specimen Description   Final    PLEURAL Performed at [facility], [address], [place], [place] [address]    Special Requests   Final    NONE Performed at [facility], [place] [place], [place] [address]    Gram Stain   Final    RARE WBC PRESENT,BOTH PMN AND MONONUCLEAR NO ORGANISMS SEEN    Culture   Final    NO GROWTH Performed at [facility], [place] [address], [place], [place] [address]    Report Status 06/25/2019 FINAL  Final  Aerobic/Anaerobic Culture (surgical/deep wound)     Status: None   Collection Time: 06/22/19 10:18 AM   Specimen: Pleural Fluid  Result Value Ref Range Status   Specimen Description   Final    PLEURAL Performed at [facility], [address] [place], [place] [address]    Special Requests   Final    Normal Performed at [facility], [address] [place], [place] [address]    Gram Stain   Final    RARE WBC PRESENT, PREDOMINANTLY MONONUCLEAR NO ORGANISMS SEEN    Culture   Final    No growth aerobically or anaerobically. Performed at [facility], [place] [address], [place], [place] [address]    Report Status 06/27/2019 FINAL  Final  Surgical pcr screen     Status: Abnormal   Collection Time: 06/25/19  8:39 AM   Specimen: Nasal Mucosa; Nasal Swab  Result Value Ref Range Status   MRSA, PCR  NEGATIVE NEGATIVE Final   Staphylococcus aureus POSITIVE (A) NEGATIVE Final    Comment: (NOTE) The Xpert SA Assay (FDA approved for NASAL specimens in patients 67 years of age and older), is one component of a comprehensive surveillance program. It is not intended to diagnose infection nor to guide or monitor treatment. Performed at [facility], [place] [address], [place], [place] [address]   Gram stain     Status: None   Collection Time: 06/26/19  2:06 PM   Specimen: PATH Cytology Pleural fluid; Body Fluid  Result Value Ref Range Status   Specimen Description FLUID PLEURAL  Final   Special Requests NONE  Final   Gram Stain   Final    RARE WBC PRESENT, PREDOMINANTLY MONONUCLEAR NO ORGANISMS SEEN Performed at [facility], [address] [place], [place] [address]    Report Status 06/26/2019 FINAL  Final  Acid Fast Smear (AFB)     Status: None   Collection Time: 06/26/19  2:06 PM   Specimen: PATH Cytology Pleural fluid; Body Fluid  Result Value Ref Range Status   AFB Specimen Processing Concentration  Final   Acid Fast Smear Negative  Final    Comment: (NOTE) Performed At: [facility] [place], [place] [id number] Rush Farmer MD [id number]    Source (AFB) FLUID  Final    Comment: PLEURAL Performed at [facility], [place] [address] [place], [place] [address]   Aerobic/Anaerobic Culture (surgical/deep wound)  Status: None (Preliminary result)   Collection Time: 06/26/19  2:12 PM   Specimen: Pleural, Left; Lung  Result Value Ref Range Status   Specimen Description FLUID LEFT PLEURAL  Final   Special Requests PEEL  Final   Gram Stain   Final    RARE WBC PRESENT, PREDOMINANTLY MONONUCLEAR NO ORGANISMS SEEN    Culture   Final    NO GROWTH < 24 HOURS Performed at [facility], [place] [address], [place], [place] [address]    Report Status PENDING  Incomplete  Acid Fast Smear (AFB)     Status: None   Collection Time:  06/26/19  2:12 PM   Specimen: Pleural, Left; Lung  Result Value Ref Range Status   AFB Specimen Processing Comment  Final    Comment: Tissue Grinding and Digestion/Decontamination   Acid Fast Smear Negative  Final    Comment: (NOTE) Performed At: [facility] [place], [place] [id number] Rush Farmer MD [id number]    Source (AFB) FLUID  Final    Comment: LEFT PLEURAL PEEL Performed at [facility], [place] [address] [place], [place] [address]          Radiology Studies: US Renal  Result Date: 06/27/2019 CLINICAL DATA:  Hypertension.  Diabetes.  Acute kidney injury. EXAM: RENAL / URINARY TRACT ULTRASOUND COMPLETE COMPARISON:  None. FINDINGS: Right Kidney: Renal measurements: 10.8 x 6.5 x 6.1 cm = volume: 243 mL . Echogenicity within normal limits. No mass or hydronephrosis visualized. Left Kidney: Renal measurements: 10.8 x 6.1 x 5.2 cm = volume: 177 mL. Echogenicity within normal limits. No mass or hydronephrosis visualized. Bladder: Decompressed. Note: Ascites and right pleural effusion evident. IMPRESSION: 1. No hydronephrosis. 2. Ascites with right pleural effusion. Electronically Signed   By: Misty Stanley M.D.   On: 06/27/2019 16:31   Dg Chest Port 1 View  Result Date: 06/27/2019 CLINICAL DATA:  Empyema status post VATS EXAM: PORTABLE CHEST 1 VIEW COMPARISON:  06/26/2019 FINDINGS: The left-sided chest tube is stable. No definite pneumothorax. No residual pleural effusion. The right IJ catheter is stable. Improved lung aeration bilaterally with clearing postoperative atelectasis. IMPRESSION: 1. Support apparatus in good position, unchanged. 2. Improved lung aeration with clearing atelectasis. 3. No pneumothorax or recurrent effusion on the left. Electronically Signed   By: Marijo Sanes M.D.   On: 06/27/2019 08:08   Dg Chest Port 1 View  Result Date: 06/26/2019 CLINICAL DATA:  Empyema, postoperative EXAM: PORTABLE CHEST 1 VIEW COMPARISON:   06/26/2019, 7:38 a.m. FINDINGS: Interval postoperative findings of left thoracotomy with left-sided chest tube and improved aeration of the left lung base. A previously seen pigtail chest catheter about the left lung base has been removed. No significant pneumothorax. There is new atelectasis or consolidation of the inferior right upper lobe. Interval placement of a right neck vascular catheter, tip positioned near the superior cavoatrial junction. The heart mediastinum are unremarkable. IMPRESSION: 1. Interval postoperative findings of left thoracotomy with left-sided chest tube and improved aeration of the left lung base. A previously seen pigtail chest catheter about the left lung base has been removed. No significant pneumothorax. 2. There is new atelectasis or consolidation of the inferior right upper lobe. 3. Interval placement of a right neck vascular catheter, tip positioned near the superior cavoatrial junction. Electronically Signed   By: Eddie Candle M.D.   On: 06/26/2019 16:43        Scheduled Meds: . acetaminophen  1,000 mg Oral Q6H  Or  . acetaminophen (TYLENOL) oral liquid 160 mg/5 mL  1,000 mg Oral Q6H  . bisacodyl  10 mg Oral Daily  . Chlorhexidine Gluconate Cloth  6 each Topical Daily  . enoxaparin (LOVENOX) injection  40 mg Subcutaneous Daily  . ferrous sulfate  325 mg Oral BID WC  . furosemide  40 mg Intravenous Q12H  . insulin aspart  0-24 Units Subcutaneous TID AC & HS  . linezolid  600 mg Oral Q12H  . senna-docusate  1 tablet Oral QHS  . sodium zirconium cyclosilicate  10 g Oral TID   Continuous Infusions:    LOS: 4 days    Time spent: 35 minutes    Elmarie Shiley, MD [facility] Pager [phone]  If 7PM-7AM, please contact night-coverage [email or web address] Password [email or web address] 06/28/2019, 8:51 AM

## 2019-06-29 ENCOUNTER — Ambulatory Visit: Payer: Self-pay | Admitting: Family

## 2019-06-29 ENCOUNTER — Inpatient Hospital Stay (HOSPITAL_COMMUNITY): Payer: Self-pay

## 2019-06-29 DIAGNOSIS — R609 Edema, unspecified: Secondary | ICD-10-CM

## 2019-06-29 LAB — RENAL FUNCTION PANEL
Albumin: 2.3 g/dL — ABNORMAL LOW (ref 3.5–5.0)
Anion gap: 9 (ref 5–15)
BUN: 63 mg/dL — ABNORMAL HIGH (ref 6–20)
CO2: 24 mmol/L (ref 22–32)
Calcium: 7.7 mg/dL — ABNORMAL LOW (ref 8.9–10.3)
Chloride: 108 mmol/L (ref 98–111)
Creatinine, Ser: 2.13 mg/dL — ABNORMAL HIGH (ref 0.61–1.24)
GFR calc Af Amer: 39 mL/min — ABNORMAL LOW (ref >60)
GFR calc non Af Amer: 34 mL/min — ABNORMAL LOW (ref >60)
Glucose, Bld: 89 mg/dL (ref 70–99)
Phosphorus: 4.7 mg/dL — ABNORMAL HIGH (ref 2.5–4.6)
Potassium: 3.7 mmol/L (ref 3.5–5.1)
Sodium: 141 mmol/L (ref 135–145)

## 2019-06-29 LAB — GLUCOSE, CAPILLARY
Glucose-Capillary: 99 mg/dL (ref 70–99)
Glucose-Capillary: 149 mg/dL — ABNORMAL HIGH (ref 70–99)
Glucose-Capillary: 127 mg/dL — ABNORMAL HIGH (ref 70–99)
Glucose-Capillary: 117 mg/dL — ABNORMAL HIGH (ref 70–99)

## 2019-06-29 LAB — HEMOGLOBIN AND HEMATOCRIT, BLOOD
HCT: 23.7 % — ABNORMAL LOW (ref 39.0–52.0)
Hemoglobin: 7.7 g/dL — ABNORMAL LOW (ref 13.0–17.0)

## 2019-06-29 MED ORDER — DARBEPOETIN ALFA 100 MCG/0.5ML IJ SOSY
100.0000 ug | PREFILLED_SYRINGE | Freq: Once | INTRAMUSCULAR | Status: AC
  Administered 2019-06-29: 100 ug via SUBCUTANEOUS
  Filled 2019-06-29: qty 0.5

## 2019-06-29 NOTE — Progress Notes (Signed)
PROGRESS NOTE    Nathan [name]  [id number] DOB: Dec 11, 1960 DOA: 06/24/2019 PCP: Derinda Late, MD   Brief Narrative: 58 year old with past medical history significant for insulin-dependent diabetes, hypertension, A. fib on Eliquis noncompliant due to affordability issues, anemia of chronic disease, chronic kidney disease a stage III, right thumb cellulitis status post IND and MRSA bacteremia on Zyvox is a direct transfer from [facility] for concern of left hydropneumothorax.  Patient admitted on 06/19/2019 due to fluid overload anasarca his x-ray showed infiltrates and BNP was elevated.  Patient is started on antibiotics for concern of healthcare associated pneumonia vancomycin and Rocephin was started initially which was changed to Zyvox as per ID recommendation.  Patient was given IV Lasix for concern of acute on chronic CHF.  COVID-19 was negative.  Patient remained afebrile, no leukocytosis.  A CT chest showed collection of the left side  that could be empyema with a right upper lobe possible infiltrates.  Patient had left chest catheter placement by IR on 06/22/2019.   No real change on x-ray after placement of pigtail.  Dr. Adora Fridge discussed with Dr. Servando Snare thoracic surgery who recommended transfer to [facility] for possible VATS.  Repeat CT chest from 10//2020 showed moderate loculated left hydropneumothorax and a small to moderate right pleural effusion not significantly changed in volume compared to prior examination. Patient underwent VATS procedure on 10/6   Assessment & Plan:   Principal Problem:   Hydropneumothorax Active Problems:   CKD (chronic kidney disease), stage III   Hypertension   Insulin dependent diabetes mellitus type IA (HCC)   Anemia of chronic disease   Atrial fibrillation, chronic (HCC)   Acute on chronic combined systolic and diastolic CHF (congestive heart failure) (HCC)   Cellulitis of right hand   AKI (acute kidney injury)  ([place])   1-Left side hydropneumothorax, complex left side pleural effusion/empyema -Noted on CT. patient was transferred from [facility] to [facility] for VATS procedure. -Patient status post pigtail catheter placement by IR on 06/22/2019 at [facility]. -Patient underwent VATS on 06/26/2019 -CVTS recommend removing chest tube probably tomorrow.  -Plan to resume eliquis per CVTS/  -Follow recommendation for antibiotics tx length.  -chest tube removed today.   2-Acute systolic heart failure: Patient presented with lower extremity edema and elevated BNP on 06/19/2019 -Echocardiogram show ejection fraction 40 to 45%.  TEE ejection fraction 50 to 55% -Patient received IV Lasix at [facility], currently on hold due to AKI -Continue to monitor output  -defer lasix to nephrology.   3-Hyperkalemia: Received  an amp of bicarb and Kayexalate one-time 1. Trending down, today at 4.7.  Received lokelma.   4-AKI on chronic kidney disease 3: Possible AKI, possible related to medication antibiotics. While at [facility] patient creatinine range 1.4-1.6. Creatinine was 1.25 August 2018 however improved with March and June 2020. Renal US negative for hydronephrosis.  Cr today stable at 2. o Continue with IV lasix.   5-History of right hand cellulitis with MRSA bacteremia MRSA abscess of the right thumb: -Infectious disease was following patient at [facility]. -Blood cultures from [facility] 06/18/2019 MRSA positive wound culture 9/21 MRSA positive. -Subsequent culture has been negative. -He was placed on Zyvox 600 mg twice daily with his last dose 06/30/2019 -TEE was negative during previous hospitalization.  6-A. fib, paroxysmal: Patient was on Eliquis, this was restarted at [facility]. Currently he is in sinus rhythm Continue with Lopressor. He was on Eliquis, I have ask CVTS to comment on  when to resume Eliquis.  Per CVTS, plan to resume eliquis  After chest tube remove.   7-Hypertension: Continue to  hold ACE  8-Diabetes type 2: Continue sliding scale insulin  9-Left upper  extremity edema: Doppler ordered  10-Anemia;  Follow trend.  Mild drop.  Started  ferrous sulfate.  Follow trend.   Estimated body mass index is 25.13 kg/m as calculated from the following:   Height as of 06/19/19: 5\' 9"  (1.753 m).   Weight as of this encounter: 77.2 kg.   DVT prophylaxis: Lovenox Code Status: Full code Family Communication: care discussed with patient.  Disposition Plan: remain in patient for treatment of hydropneumothorax, hyperkalemia. AKI.  Consultants:   CVTS  Cardiology  Nephrology  Procedures:   Doppler Upper extre;  Renal US.   Antimicrobials:  Zyvox  Subjective: Report scrotal edema persist.  Denies dyspnea.    Objective: Vitals:   06/28/19 0400 06/28/19 2032 06/29/19 0605 06/29/19 1416  BP: 136/76 132/68 (!) 157/71 (!) 153/81  Pulse: 78 77 86 76  Resp: 12   18  Temp: 98.2 F (36.8 C) 97.9 F (36.6 C) 98.5 F (36.9 C) 98.2 F (36.8 C)  TempSrc: Oral Oral Oral Oral  SpO2: 90% 95% 94% 97%  Weight:        Intake/Output Summary (Last 24 hours) at 06/29/2019 1550 Last data filed at 06/29/2019 1300 Gross per 24 hour  Intake 240 ml  Output 2005 ml  Net -1765 ml   Filed Weights   06/26/19 0500  Weight: 77.2 kg    Examination:  General exam: NAD Respiratory system: Crackles bases Cardiovascular system: S 1, S 2 RRR Gastrointestinal system: BS present, soft, nt Central nervous system: Alert, non focal.  Extremities: Symmetric power.  Skin: no rashes.    Data Reviewed: I have personally reviewed following labs and imaging studies  CBC: Recent Labs  Lab 06/24/19 0707 06/25/19 0430 06/25/19 1810 06/26/19 0409 06/26/19 1359 06/27/19 0500 06/28/19 0320 06/29/19 0336  WBC 6.7 7.0 8.2 6.6  --  10.4 8.6  --   NEUTROABS 5.1  --   --   --   --   --   --   --   HGB 9.0* 8.1* 9.3* 8.6* 7.1* 8.8* 7.8* 7.7*  HCT 28.3* 25.0* 28.7* 27.9* 21.0*  26.9* 24.1* 23.7*  MCV 92.2 92.3 92.9 93.3  --  92.8 92.0  --   PLT 455* 404* 389 391  --  311 235  --    Basic Metabolic Panel: Recent Labs  Lab 06/25/19 0430  06/27/19 0500 06/27/19 0824 06/27/19 1215 06/27/19 1734 06/28/19 0320 06/29/19 0336  NA 142   < > 141 142 144 143 143 141  K 5.7*   < > 6.0* 6.0* 5.9* 5.3* 4.7 3.7  CL 114*   < > 115* 116* 114* 112* 110 108  CO2 22   < > 22 22 22 22 24 24   GLUCOSE 170*   < > 112* 111* 178* 147* 99 89  BUN 64*   < > 65* 67* 68* 69* 71* 63*  CREATININE 1.87*   < > 1.89* 1.97* 2.10* 2.23* 2.12* 2.13*  CALCIUM 7.8*   < > 7.7* 7.6* 7.7* 8.0* 7.8* 7.7*  MG 2.5*  --   --   --   --   --   --   --   PHOS 4.3  --  5.8*  --   --   --  4.7* 4.7*   < > =  values in this interval not displayed.   GFR: Estimated Creatinine Clearance: 38.7 mL/min (A) (by C-G formula based on SCr of 2.13 mg/dL (H)). Liver Function Tests: Recent Labs  Lab 06/25/19 1810 06/27/19 0500 06/28/19 0320 06/29/19 0336  AST 21  --  35  --   ALT 20  --  37  --   ALKPHOS 253*  --  208*  --   BILITOT 0.8  --  0.7  --   PROT 5.5*  --  5.1*  --   ALBUMIN 2.2* 2.4* 2.4*   2.3* 2.3*   No results for input(s): LIPASE, AMYLASE in the last 168 hours. No results for input(s): AMMONIA in the last 168 hours. Coagulation Profile: Recent Labs  Lab 06/25/19 0430 06/25/19 1810  INR 1.4* 1.4*   Cardiac Enzymes: No results for input(s): CKTOTAL, CKMB, CKMBINDEX, TROPONINI in the last 168 hours. BNP (last 3 results) No results for input(s): PROBNP in the last 8760 hours. HbA1C: No results for input(s): HGBA1C in the last 72 hours. CBG: Recent Labs  Lab 06/28/19 1132 06/28/19 1647 06/28/19 2113 06/29/19 0609 06/29/19 1147  GLUCAP 145* 191* 150* 99 149*   Lipid Profile: No results for input(s): CHOL, HDL, LDLCALC, TRIG, CHOLHDL, LDLDIRECT in the last 72 hours. Thyroid Function Tests: No results for input(s): TSH, T4TOTAL, FREET4, T3FREE, THYROIDAB in the last 72  hours. Anemia Panel: Recent Labs    06/27/19 0500  FERRITIN 384*  TIBC 192*  IRON 94   Sepsis Labs: No results for input(s): PROCALCITON, LATICACIDVEN in the last 168 hours.  Recent Results (from the past 240 hour(s))  SARS CORONAVIRUS 2 (TAT 6-24 HRS) Nasopharyngeal Nasopharyngeal Swab     Status: None   Collection Time: 06/19/19  6:16 PM   Specimen: Nasopharyngeal Swab  Result Value Ref Range Status   SARS Coronavirus 2 NEGATIVE NEGATIVE Final    Comment: (NOTE) SARS-CoV-2 target nucleic acids are NOT DETECTED. The SARS-CoV-2 RNA is generally detectable in upper and lower respiratory specimens during the acute phase of infection. Negative results do not preclude SARS-CoV-2 infection, do not rule out co-infections with other pathogens, and should not be used as the sole basis for treatment or other patient management decisions. Negative results must be combined with clinical observations, patient history, and epidemiological information. The expected result is Negative. Fact Sheet for Patients: SugarRoll.be Fact Sheet for Healthcare Providers: https://www.woods-mathews.com/ This test is not yet approved or cleared by the [place] FDA and  has been authorized for detection and/or diagnosis of SARS-CoV-2 by FDA under an Emergency Use Authorization (EUA). This EUA will remain  in effect (meaning this test can be used) for the duration of the COVID-19 declaration under Section 56 4(b)(1) of the Act, 21 U.S.C. section 360bbb-3(b)(1), unless the authorization is terminated or revoked sooner. Performed at [facility], [place] [address], [place], [place] [address]   CULTURE, BLOOD (ROUTINE X 2) w Reflex to ID Panel     Status: None   Collection Time: 06/22/19 12:22 AM   Specimen: BLOOD  Result Value Ref Range Status   Specimen Description BLOOD BLOOD LEFT HAND  Final   Special Requests   Final    BOTTLES DRAWN AEROBIC AND  ANAEROBIC Blood Culture results may not be optimal due to an excessive volume of blood received in culture bottles   Culture   Final    NO GROWTH 5 DAYS Performed at [facility], [address], [place], [place] [address]  Report Status 06/27/2019 FINAL  Final  CULTURE, BLOOD (ROUTINE X 2) w Reflex to ID Panel     Status: None   Collection Time: 06/22/19 12:22 AM   Specimen: BLOOD  Result Value Ref Range Status   Specimen Description BLOOD RIGHT ANTECUBITAL  Final   Special Requests   Final    BOTTLES DRAWN AEROBIC AND ANAEROBIC Blood Culture adequate volume   Culture   Final    NO GROWTH 5 DAYS Performed at [facility], [address] [place], [place] [address]    Report Status 06/27/2019 FINAL  Final  Body fluid culture (includes gram stain)     Status: None   Collection Time: 06/22/19 10:18 AM   Specimen: Pleural Fluid  Result Value Ref Range Status   Specimen Description   Final    PLEURAL Performed at [facility], [address] [place], [place] [address]    Special Requests   Final    NONE Performed at [facility], [address] [place], [place] [address]    Gram Stain   Final    RARE WBC PRESENT,BOTH PMN AND MONONUCLEAR NO ORGANISMS SEEN    Culture   Final    NO GROWTH Performed at [facility], [place] [address], [place], [place] [address]    Report Status 06/25/2019 FINAL  Final  Aerobic/Anaerobic Culture (surgical/deep wound)     Status: None   Collection Time: 06/22/19 10:18 AM   Specimen: Pleural Fluid  Result Value Ref Range Status   Specimen Description   Final    PLEURAL Performed at [facility], [address] [place], [place] [address]    Special Requests   Final    Normal Performed at [facility], [address] [place], [place] [address]    Gram Stain   Final    RARE WBC PRESENT, PREDOMINANTLY MONONUCLEAR NO ORGANISMS SEEN    Culture   Final    No growth aerobically  or anaerobically. Performed at [facility], [place] [address], [place], [place] [address]    Report Status 06/27/2019 FINAL  Final  Surgical pcr screen     Status: Abnormal   Collection Time: 06/25/19  8:39 AM   Specimen: Nasal Mucosa; Nasal Swab  Result Value Ref Range Status   MRSA, PCR NEGATIVE NEGATIVE Final   Staphylococcus aureus POSITIVE (A) NEGATIVE Final    Comment: (NOTE) The Xpert SA Assay (FDA approved for NASAL specimens in patients 58 years of age and older), is one component of a comprehensive surveillance program. It is not intended to diagnose infection nor to guide or monitor treatment. Performed at [facility], [place] [address], [place], [place] [address]   Gram stain     Status: None   Collection Time: 06/26/19  2:06 PM   Specimen: PATH Cytology Pleural fluid; Body Fluid  Result Value Ref Range Status   Specimen Description FLUID PLEURAL  Final   Special Requests NONE  Final   Gram Stain   Final    RARE WBC PRESENT, PREDOMINANTLY MONONUCLEAR NO ORGANISMS SEEN Performed at [facility], [address] [place], [place] [address]    Report Status 06/26/2019 FINAL  Final  Fungus Culture With Stain     Status: None (Preliminary result)   Collection Time: 06/26/19  2:06 PM   Specimen: PATH Cytology Pleural fluid; Body Fluid  Result Value Ref Range Status   Fungus Stain Final report  Final    Comment: (NOTE) Performed At: [facility] [place], [place] [id number] Rush Farmer MD [id number]    Fungus (Mycology) Culture PENDING  Incomplete   Fungal Source FLUID  Final    Comment: PLEURAL Performed at [facility], [place] [address], [place], [place] [address]   Acid Fast Smear (AFB)     Status: None   Collection Time: 06/26/19  2:06 PM   Specimen: PATH Cytology Pleural fluid; Body Fluid  Result Value Ref Range Status   AFB Specimen Processing Concentration  Final   Acid Fast Smear Negative  Final    Comment:  (NOTE) Performed At: [facility] [place], [place] [id number] Rush Farmer MD [id number]    Source (AFB) FLUID  Final    Comment: PLEURAL Performed at [facility], [place] [address] [place], [place] [address]   Fungus Culture Result     Status: None   Collection Time: 06/26/19  2:06 PM  Result Value Ref Range Status   Result 1 Comment  Final    Comment: (NOTE) KOH/Calcofluor preparation:  no fungus observed. Performed At: [facility] [place], [place] [id number] Rush Farmer MD [id number]   Aerobic/Anaerobic Culture (surgical/deep wound)     Status: None (Preliminary result)   Collection Time: 06/26/19  2:12 PM   Specimen: Pleural, Left; Lung  Result Value Ref Range Status   Specimen Description FLUID LEFT PLEURAL  Final   Special Requests PEEL  Final   Gram Stain   Final    RARE WBC PRESENT, PREDOMINANTLY MONONUCLEAR NO ORGANISMS SEEN Performed at [facility], [place] [address] [place], [place] [address]    Culture   Final    RARE STAPHYLOCOCCUS AUREUS SUSCEPTIBILITIES TO FOLLOW NO ANAEROBES ISOLATED; CULTURE IN PROGRESS FOR 5 DAYS    Report Status PENDING  Incomplete  Acid Fast Smear (AFB)     Status: None   Collection Time: 06/26/19  2:12 PM   Specimen: Pleural, Left; Lung  Result Value Ref Range Status   AFB Specimen Processing Comment  Final    Comment: Tissue Grinding and Digestion/Decontamination   Acid Fast Smear Negative  Final    Comment: (NOTE) Performed At: [facility] [place], [place] [id number] Rush Farmer MD [id number]    Source (AFB) FLUID  Final    Comment: LEFT PLEURAL PEEL Performed at [facility], [place] [address], [place], [place] [address]          Radiology Studies: Dg Chest Port 1 View  Result Date: 06/28/2019 CLINICAL DATA:  Empyema status post decortication and chest tube placement. EXAM: PORTABLE CHEST 1 VIEW COMPARISON:  Chest  x-ray 06/27/2019 and chest CT 06/24/2019 FINDINGS: The left-sided chest tube is stable. No pneumothorax. Small amount of residual subcutaneous emphysema. The right IJ catheter is in good position, unchanged. The cardiac silhouette, mediastinal and hilar contours are within normal limits and stable. Stable small left effusion and left lower lobe atelectasis/infiltrate. The right lung is clear. IMPRESSION: 1. Stable left-sided chest tube without pneumothorax. 2. Persistent small left effusion and left lower lobe atelectasis/infiltrate. Electronically Signed   By: Marijo Sanes M.D.   On: 06/28/2019 10:45   Vas [place] Upper Extremity Venous Duplex  Result Date: 06/29/2019 UPPER VENOUS STUDY  Indications: Edema Comparison Study: No priors. Performing Technologist: Oda Cogan RDMS, RVT  Examination Guidelines: A complete evaluation includes B-mode imaging, spectral Doppler, color Doppler, and power  Doppler as needed of all accessible portions of each vessel. Bilateral testing is considered an integral part of a complete examination. Limited examinations for reoccurring indications may be performed as noted.  Right Findings: +----------+------------+---------+-----------+----------+-------+  RIGHT      Compressible Phasicity Spontaneous Properties Summary  +----------+------------+---------+-----------+----------+-------+  Subclavian     Full        Yes        Yes                         +----------+------------+---------+-----------+----------+-------+  Left Findings: +----------+------------+---------+-----------+----------+-------+  LEFT       Compressible Phasicity Spontaneous Properties Summary  +----------+------------+---------+-----------+----------+-------+  IJV            Full        Yes        Yes                         +----------+------------+---------+-----------+----------+-------+  Subclavian     Full        Yes        Yes                          +----------+------------+---------+-----------+----------+-------+  Axillary       Full        Yes        Yes                         +----------+------------+---------+-----------+----------+-------+  Brachial       Full        Yes        Yes                         +----------+------------+---------+-----------+----------+-------+  Radial         Full                                               +----------+------------+---------+-----------+----------+-------+  Ulnar          Full                                               +----------+------------+---------+-----------+----------+-------+  Cephalic     Partial                                      Acute   +----------+------------+---------+-----------+----------+-------+ Localized superficial thrombus in the cephalic vein at the antecubital location. All other veins are patent.  Summary:  Right: No evidence of thrombosis in the subclavian.  Left: No evidence of deep vein thrombosis in the upper extremity. Findings consistent with acute superficial vein thrombosis involving the left cephalic vein.  *See table(s) above for measurements and observations.    Preliminary         Scheduled Meds:  acetaminophen  1,000 mg Oral Q6H   Or   acetaminophen (TYLENOL) oral liquid 160 mg/5 mL  1,000 mg Oral Q6H   bisacodyl  10 mg Oral Daily   Chlorhexidine Gluconate Cloth  6 each Topical Daily  enoxaparin (LOVENOX) injection  40 mg Subcutaneous Daily   ferrous sulfate  325 mg Oral BID WC   furosemide  40 mg Intravenous Q12H   insulin aspart  0-24 Units Subcutaneous TID AC & HS   linezolid  600 mg Oral Q12H   senna-docusate  1 tablet Oral QHS   Continuous Infusions:    LOS: 5 days    Time spent: 35 minutes    Elmarie Shiley, MD [facility] Pager [phone]  If 7PM-7AM, please contact night-coverage [email or web address] Password [email or web address] 06/29/2019, 3:50 PM

## 2019-06-29 NOTE — Progress Notes (Signed)
Subjective:   UOP over 2 liters - crt stable at 2.1-  Hyperkalemia resolved with scheduled lokelma- BP is higher still    Objective Vital signs in last 24 hours: Vitals:   06/28/19 0316 06/28/19 0400 06/28/19 2032 06/29/19 0605  BP:  136/76 132/68 (!) 157/71  Pulse: 81 78 77 86  Resp: 11 12    Temp: 98.5 F (36.9 C) 98.2 F (36.8 C) 97.9 F (36.6 C) 98.5 F (36.9 C)  TempSrc: Oral Oral Oral Oral  SpO2: 92% 90% 95% 94%  Weight:       Weight change:   Intake/Output Summary (Last 24 hours) at 06/29/2019 1026 Last data filed at 06/29/2019 0738 Gross per 24 hour  Intake -  Output 1605 ml  Net -1605 ml   Assessment/Plan: 58 year old male with  diabetes, hypertension-both of which are under poor control.  Likely has some baseline CKD-even though creatinine in June appeared normal it is an outlier.  Over the last 2 weeks he has had a prolonged hospitalization with infectious complications.  Creatinine is possibly above baseline but had been stable and indicated a GFR in the 40s 1.Renal-possibly some baseline CKD with creatinine in December 2019 1.6.  True that there is a creatinine in the system of 1.1 in June however that is the outlier.  Since hospitalized with infectious complications including a MRSA bacteremia-patient has had worsening of his CKD, however creatinine had been fairly stable in the 1.6-1.8 range indicating a GFR in the 40s until the VATS.  Now with A on CRF in the setting of post op from VATS- crt over 2.  No obvious hemodynamic issue but still suspect is the cause-  U/A is bland- hopefully showing signs of plateauing before improvement 2. Hypertension/volume  -blood pressure better, even low post op.  Was on Toprol but appears to have been stopped.  Likely would be a candidate for ACE or ARB but would not start it until outpatient setting and without hyperkalemia.  He does appear to be volume overloaded but also with a low albumin so is likely third spacing. started Lasix ,  has had some impact and now also maybe some post injury diureses.  Cont lasix for now 3.  Hyperkalemia-mild and handled by the [facility].but now resolved, will stop 4. Anemia  -probably mostly due to hospitalization but could have some anemia of CKD.   iron stores ok,  Got a transfusion of one unit 10/6- trending down today.  Will give one dose of ESA today darbe 100 5. Dispo-  If has good UOP again next 24 hours and crt stable or improved I think could transition to OP management of his edema and CKD     Louis Meckel    Labs: Basic Metabolic Panel: Recent Labs  Lab 06/27/19 0500  06/27/19 1734 06/28/19 0320 06/29/19 0336  NA 141   < > 143 143 141  K 6.0*   < > 5.3* 4.7 3.7  CL 115*   < > 112* 110 108  CO2 22   < > 22 24 24   GLUCOSE 112*   < > 147* 99 89  BUN 65*   < > 69* 71* 63*  CREATININE 1.89*   < > 2.23* 2.12* 2.13*  CALCIUM 7.7*   < > 8.0* 7.8* 7.7*  PHOS 5.8*  --   --  4.7* 4.7*   < > = values in this interval not displayed.   Liver Function Tests: Recent Labs  Lab 06/25/19  1810 06/27/19 0500 06/28/19 0320 06/29/19 0336  AST 21  --  35  --   ALT 20  --  37  --   ALKPHOS 253*  --  208*  --   BILITOT 0.8  --  0.7  --   PROT 5.5*  --  5.1*  --   ALBUMIN 2.2* 2.4* 2.4*  2.3* 2.3*   No results for input(s): LIPASE, AMYLASE in the last 168 hours. No results for input(s): AMMONIA in the last 168 hours. CBC: Recent Labs  Lab 06/24/19 0707 06/25/19 0430 06/25/19 1810 06/26/19 0409  06/27/19 0500 06/28/19 0320 06/29/19 0336  WBC 6.7 7.0 8.2 6.6  --  10.4 8.6  --   NEUTROABS 5.1  --   --   --   --   --   --   --   HGB 9.0* 8.1* 9.3* 8.6*   < > 8.8* 7.8* 7.7*  HCT 28.3* 25.0* 28.7* 27.9*   < > 26.9* 24.1* 23.7*  MCV 92.2 92.3 92.9 93.3  --  92.8 92.0  --   PLT 455* 404* 389 391  --  311 235  --    < > = values in this interval not displayed.   Cardiac Enzymes: No results for input(s): CKTOTAL, CKMB, CKMBINDEX, TROPONINI in the last 168  hours. CBG: Recent Labs  Lab 06/28/19 0641 06/28/19 1132 06/28/19 1647 06/28/19 2113 06/29/19 0609  GLUCAP 92 145* 191* 150* 99    Iron Studies:  Recent Labs    06/27/19 0500  IRON 94  TIBC 192*  FERRITIN 384*   Studies/Results: US Renal  Result Date: 06/27/2019 CLINICAL DATA:  Hypertension.  Diabetes.  Acute kidney injury. EXAM: RENAL / URINARY TRACT ULTRASOUND COMPLETE COMPARISON:  None. FINDINGS: Right Kidney: Renal measurements: 10.8 x 6.5 x 6.1 cm = volume: 243 mL . Echogenicity within normal limits. No mass or hydronephrosis visualized. Left Kidney: Renal measurements: 10.8 x 6.1 x 5.2 cm = volume: 177 mL. Echogenicity within normal limits. No mass or hydronephrosis visualized. Bladder: Decompressed. Note: Ascites and right pleural effusion evident. IMPRESSION: 1. No hydronephrosis. 2. Ascites with right pleural effusion. Electronically Signed   By: Misty Stanley M.D.   On: 06/27/2019 16:31   Dg Chest Port 1 View  Result Date: 06/28/2019 CLINICAL DATA:  Empyema status post decortication and chest tube placement. EXAM: PORTABLE CHEST 1 VIEW COMPARISON:  Chest x-ray 06/27/2019 and chest CT 06/24/2019 FINDINGS: The left-sided chest tube is stable. No pneumothorax. Small amount of residual subcutaneous emphysema. The right IJ catheter is in good position, unchanged. The cardiac silhouette, mediastinal and hilar contours are within normal limits and stable. Stable small left effusion and left lower lobe atelectasis/infiltrate. The right lung is clear. IMPRESSION: 1. Stable left-sided chest tube without pneumothorax. 2. Persistent small left effusion and left lower lobe atelectasis/infiltrate. Electronically Signed   By: Marijo Sanes M.D.   On: 06/28/2019 10:45   Medications: Infusions:   Scheduled Medications: . acetaminophen  1,000 mg Oral Q6H   Or  . acetaminophen (TYLENOL) oral liquid 160 mg/5 mL  1,000 mg Oral Q6H  . bisacodyl  10 mg Oral Daily  . Chlorhexidine Gluconate  Cloth  6 each Topical Daily  . enoxaparin (LOVENOX) injection  40 mg Subcutaneous Daily  . ferrous sulfate  325 mg Oral BID WC  . furosemide  40 mg Intravenous Q12H  . insulin aspart  0-24 Units Subcutaneous TID AC & HS  . linezolid  600 mg Oral  Q12H  . senna-docusate  1 tablet Oral QHS  . sodium zirconium cyclosilicate  10 g Oral Daily    have reviewed scheduled and prn medications.  Physical Exam: General: alert,  no c/o's  Heart: RRR Lungs: mostly clear- CT in place Abdomen: soft, non tender Extremities: 1 + pitting edema to LE's     06/29/2019,10:26 AM  LOS: 5 days

## 2019-06-29 NOTE — Progress Notes (Addendum)
      [address]       [place] [address]             [phone]      3 Days Post-Op Procedure(s) (LRB): VIDEO ASSISTED THORACOSCOPY DECORTICATION (Left) VIDEO BRONCHOSCOPY (N/A) Subjective: Threw up yesterday since he had so much thick phlegm in his throat. Continues to ask about the Eliquis he will get "for free" and when he can go home. He would like me to rip his chest tube out now so he can go.    Objective: Vital signs in last 24 hours: Temp:  [97.9 F (36.6 C)-98.5 F (36.9 C)] 98.5 F (36.9 C) (10/09 0605) Pulse Rate:  [77-86] 86 (10/09 0605) Cardiac Rhythm: Normal sinus rhythm (10/08 0801) BP: (132-157)/(68-71) 157/71 (10/09 0605) SpO2:  [94 %-95 %] 94 % (10/09 0605)     Intake/Output from previous day: 10/08 0701 - 10/09 0700 In: -  Out: 2135 [Urine:2025; Chest Tube:110] Intake/Output this shift: Total I/O In: -  Out: 20 [Chest Tube:20]  General appearance: alert, cooperative and no distress Heart: sinus tachycardia Lungs: clear to auscultation bilaterally Abdomen: soft, non-tender; bowel sounds normal; no masses,  no organomegaly Extremities: extremities normal, atraumatic, no cyanosis or edema Wound: clean and dry port sites  Lab Results: Recent Labs    06/27/19 0500 06/28/19 0320 06/29/19 0336  WBC 10.4 8.6  --   HGB 8.8* 7.8* 7.7*  HCT 26.9* 24.1* 23.7*  PLT 311 235  --    BMET:  Recent Labs    06/28/19 0320 06/29/19 0336  NA 143 141  K 4.7 3.7  CL 110 108  CO2 24 24  GLUCOSE 99 89  BUN 71* 63*  CREATININE 2.12* 2.13*  CALCIUM 7.8* 7.7*    PT/INR: No results for input(s): LABPROT, INR in the last 72 hours. ABG    Component Value Date/Time   PHART 7.313 (L) 06/27/2019 0440   HCO3 21.3 06/27/2019 0440   TCO2 18 (L) 06/26/2019 1359   ACIDBASEDEF 3.9 (H) 06/27/2019 0440   O2SAT 97.9 06/27/2019 0440   CBG (last 3)  Recent Labs    06/28/19 1647 06/28/19 2113 06/29/19 0609  GLUCAP 191* 150* 99     Assessment/Plan: S/P Procedure(s) (LRB): VIDEO ASSISTED THORACOSCOPY DECORTICATION (Left) VIDEO BRONCHOSCOPY (N/A)  1. CV-Hemodynamically stable and in NSR. BP well controlled. Eliquis vs. Coumadin.  2. Pulm-tolerating room air with good oxygen support.  3. Renal-creatinine stable-nephrology following 4. H and H stable 7.7/23.7 today 5. Chest tube-put out 110cc/24 hours. Will wait until Eliquis or coumadin is given before pulling the chest tube.   Plan: Continue chest tube for empyema/decortication. Antibiotics per primary. He was taken off telemetry which I'm unsure why. Working on getting a coupon for Eliquis since it is very expensive.    LOS: 5 days    Nathan [name] 06/29/2019   Agree with above Chest tube removed Continue pulm toilet Please call with questions.  Elizette Shek Bary Leriche

## 2019-06-29 NOTE — TOC Progression Note (Addendum)
Transition of Care [facility]) - Progression Note    Patient Details  Name: Nathan [name] MRN: [id number] Date of Birth: [birth year]  [facility]) CM/SW Contact  Zenon Mayo, RN Phone Number: 06/29/2019, 10:12 AM  Clinical Narrative:     NCM gave patient the eliquis patient ast form to fill out his portion , then afterwards NCM will fax.   10/10 Tomi Bamberger RN, BSN - Patient will need to call on Monday to [facility] to schedule a hospital follow up, NCM gave patient Match Letter and good RX card for other medications, informed him he must go to [facility] to get the zyvox.  Pharmacy rep states zyvox 600mg  14 tabs is 319.00.  He will need to go to [facility] to use Match letter for abx.      Expected Discharge Plan and Services                                                 Social Determinants of Health (SDOH) Interventions    Readmission Risk Interventions No flowsheet data found.

## 2019-06-29 NOTE — Progress Notes (Signed)
Left upper ext venous duplex  has been completed. Refer to [facility] under chart review to view preliminary results.   06/29/2019  11:49 AM Ricky Doan, Bonnye Fava

## 2019-06-30 ENCOUNTER — Inpatient Hospital Stay (HOSPITAL_COMMUNITY): Payer: Self-pay

## 2019-06-30 LAB — RENAL FUNCTION PANEL
Albumin: 2.3 g/dL — ABNORMAL LOW (ref 3.5–5.0)
Anion gap: 11 (ref 5–15)
BUN: 57 mg/dL — ABNORMAL HIGH (ref 6–20)
CO2: 24 mmol/L (ref 22–32)
Calcium: 7.9 mg/dL — ABNORMAL LOW (ref 8.9–10.3)
Chloride: 108 mmol/L (ref 98–111)
Creatinine, Ser: 1.83 mg/dL — ABNORMAL HIGH (ref 0.61–1.24)
GFR calc Af Amer: 47 mL/min — ABNORMAL LOW (ref >60)
GFR calc non Af Amer: 40 mL/min — ABNORMAL LOW (ref >60)
Glucose, Bld: 132 mg/dL — ABNORMAL HIGH (ref 70–99)
Phosphorus: 3.6 mg/dL (ref 2.5–4.6)
Potassium: 3.8 mmol/L (ref 3.5–5.1)
Sodium: 143 mmol/L (ref 135–145)

## 2019-06-30 LAB — CBC
HCT: 24.6 % — ABNORMAL LOW (ref 39.0–52.0)
Hemoglobin: 8.1 g/dL — ABNORMAL LOW (ref 13.0–17.0)
MCH: 29.7 pg (ref 26.0–34.0)
MCHC: 32.9 g/dL (ref 30.0–36.0)
MCV: 90.1 fL (ref 80.0–100.0)
Platelets: 188 10*3/uL (ref 150–400)
RBC: 2.73 MIL/uL — ABNORMAL LOW (ref 4.22–5.81)
RDW: 12.8 % (ref 11.5–15.5)
WBC: 7 10*3/uL (ref 4.0–10.5)
nRBC: 0 % (ref 0.0–0.2)

## 2019-06-30 LAB — GLUCOSE, CAPILLARY
Glucose-Capillary: 121 mg/dL — ABNORMAL HIGH (ref 70–99)
Glucose-Capillary: 122 mg/dL — ABNORMAL HIGH (ref 70–99)

## 2019-06-30 MED ORDER — APIXABAN 5 MG PO TABS: 5.0000 mg | ORAL_TABLET | Freq: Two times a day (BID) | ORAL | Status: DC

## 2019-06-30 MED ORDER — ONDANSETRON HCL 4 MG PO TABS: 4.0000 mg | ORAL_TABLET | Freq: Every day | ORAL | 1 refills | Status: DC | PRN

## 2019-06-30 MED ORDER — FERROUS SULFATE 325 (65 FE) MG PO TABS: 325.0000 mg | ORAL_TABLET | Freq: Two times a day (BID) | ORAL | 1 refills | Status: DC

## 2019-06-30 MED ORDER — APIXABAN 5 MG PO TABS
5.0000 mg | ORAL_TABLET | Freq: Two times a day (BID) | ORAL | Status: DC
  Administered 2019-06-30: 5 mg via ORAL
  Filled 2019-06-30: qty 1

## 2019-06-30 MED ORDER — LINEZOLID 600 MG PO TABS: 600.0000 mg | ORAL_TABLET | Freq: Two times a day (BID) | ORAL | 0 refills | Status: AC

## 2019-06-30 MED ORDER — FUROSEMIDE 40 MG PO TABS: 40.0000 mg | ORAL_TABLET | Freq: Two times a day (BID) | ORAL | 1 refills | Status: DC

## 2019-06-30 NOTE — Discharge Instructions (Signed)
Thoracoscopy, Care After °This sheet gives you information about how to care for yourself after your procedure. Your health care provider may also give you more specific instructions. If you have problems or questions, contact your health care provider. °What can I expect after the procedure? °After the procedure, it is common to have pain and soreness in the surgical area. °Follow these instructions at home: °Incision care ° °· Follow instructions from your health care provider about how to take care of your incision. Make sure you: °? Wash your hands with soap and water before you change your bandage (dressing). If soap and water are not available, use hand sanitizer. °? Change your dressing as told by your health care provider. °? Leave stitches (sutures), skin glue, or adhesive strips in place. These skin closures may need to stay in place for 2 weeks or longer. If adhesive strip edges start to loosen and curl up, you may trim the loose edges. Do not remove adhesive strips completely unless your health care provider tells you to do that. °· Check your incision areas every day for signs of infection. Check for: °? Redness, swelling, or pain. °? Fluid or blood. °? Warmth. °? Pus or a bad smell. °· Do not take baths, swim, or use a hot tub until your health care provider approves. You may take showers. °Medicines °· Take over-the-counter and prescription medicines only as told by your health care provider. °· If you were prescribed an antibiotic medicine, take it as told by your health care provider. Do not stop taking the antibiotic even if you start to feel better. °· Do not drive or use heavy machinery while taking prescription pain medicine. °· If you are taking prescription pain medicine, take actions to prevent or treat constipation. Your health care provider may recommend that you: °? Drink enough fluid to keep your urine pale yellow. °? Eat foods that are high in fiber, such as fresh fruits and vegetables,  whole grains, and beans. °? Limit foods that are high in fat and processed sugars, such as fried and sweet foods. °? Take an over-the-counter or prescription medicine for constipation. °Managing pain, stiffness, and swelling ° °· If directed, put ice on the affected area: °? Put ice in a plastic bag. °? Place a towel between your skin and the bag. °? Leave the ice on for 20 minutes, 2-3 times a day. °Preventing lung infection °· To prevent pneumonia and to keep your lungs healthy: °? Try to cough often. If it hurts to cough, hold a pillow against your chest as you cough. °? Take deep breaths or do breathing exercises as instructed by your health care provider. °? If you were given an incentive spirometer, use it as directed by your health care provider. °General instructions °· Do not lift anything that is heavier than 10 lb (4.5 kg), or the limit that you are told, until your health care provider says that it is safe. °· Do not use any products that contain nicotine or tobacco, such as cigarettes and e-cigarettes. These can delay healing after surgery. If you need help quitting, ask your health care provider. °· Avoid driving until your health care provider approves. °· If you have a chest drainage tube, care for it as instructed by your health care provider. Do not travel by airplane after the chest drainage tube is removed until your health care provider approves. °· Keep all follow-up visits as told by your health care provider. This is important. °Contact   a health care provider if: °· You have a fever. °· Pain medicines do not ease your pain. °· You have redness, swelling, or increasing pain in your incision area. °· You develop a cough that does not go away, or you are coughing up mucus that is yellow or green. °Get help right away if: °· You have fluid, blood, or pus coming from your incision. °· There is a bad smell coming from your incision or dressing. °· You develop a rash. °· You cough up blood. °· You  develop light-headedness, or you feel faint. °· You have difficulty breathing. °· You develop chest pain. °· Your heartbeat feels irregular or very fast. °These symptoms may represent a serious problem that is an emergency. Do not wait to see if the symptoms will go away. Get medical help right away. Call your local emergency services (911 in the [place] Do not drive yourself to the hospital. °Summary °· Follow instructions from your health care provider about how to take care of your incision. °· Do not drive or use heavy machinery while taking prescription pain medicine. °· Leave stitches (sutures), skin glue, or adhesive strips in place. °· Check your incision areas every day for signs of infection. °This information is not intended to replace advice given to you by your health care provider. Make sure you discuss any questions you have with your health care provider. °Document Released: 03/26/2005 Document Revised: 08/19/2017 Document Reviewed: 08/16/2017 °Elsevier Patient Education © 2020 [employer] ° °

## 2019-06-30 NOTE — Progress Notes (Addendum)
      [address]       [place] [address]             [phone]       4 Days Post-Op Procedure(s) (LRB): VIDEO ASSISTED THORACOSCOPY DECORTICATION (Left) VIDEO BRONCHOSCOPY (N/A)  Subjective: Patient with occasional nausea, but no abdominal pain. He did not sleep much last night  Objective: Vital signs in last 24 hours: Temp:  [98 F (36.7 C)-98.4 F (36.9 C)] 98 F (36.7 C) (10/10 0325) Pulse Rate:  [76-88] 88 (10/10 0325) Cardiac Rhythm: Normal sinus rhythm (10/09 1430) Resp:  [18] 18 (10/09 1416) BP: (148-165)/(73-87) 165/87 (10/10 0325) SpO2:  [96 %-97 %] 97 % (10/10 0325)     Intake/Output from previous day: 10/09 0701 - 10/10 0700 In: 240 [P.O.:240] Out: 850 [Urine:800; Emesis/NG output:30; Chest Tube:20]   Physical Exam:  Cardiovascular: RRR. Pulmonary: Clear to auscultation bilaterally Wounds: Clean and dry.  No erythema or signs of infection.   Lab Results: CBC: Recent Labs    06/28/19 0320 06/29/19 0336 06/30/19 0321  WBC 8.6  --  7.0  HGB 7.8* 7.7* 8.1*  HCT 24.1* 23.7* 24.6*  PLT 235  --  188   BMET:  Recent Labs    06/29/19 0336 06/30/19 0321  NA 141 143  K 3.7 3.8  CL 108 108  CO2 24 24  GLUCOSE 89 132*  BUN 63* 57*  CREATININE 2.13* 1.83*  CALCIUM 7.7* 7.9*    PT/INR: No results for input(s): LABPROT, INR in the last 72 hours. ABG:  INR: Will add last result for INR, ABG once components are confirmed Will add last 4 CBG results once components are confirmed  Assessment/Plan:  1. CV - SR, hypertensive-management per medicine. History of PAF. Will ask pharmacy to dose Apixaban today as chest tube removed yesterday. 2.  Pulmonary - On room air. Chest tube removed yesterday. No CXR ordered for today so will order 3. Acute on chronic renal failure-creatinine this am decreased to 1.83. Nephrology following 4. Anemia-H and H this am stable at 8.1 and 24.6. On oral ferrous sulfate 5. Management per  primary;will arrange follow up appointment  Donielle M ZimmermanPA-C 06/30/2019,7:19 AM [phone]  I have seen and examined the patient and agree with the assessment as outlined.  Plan per medical team.  Rexene Alberts, MD 06/30/2019 12:27 PM

## 2019-06-30 NOTE — Discharge Summary (Signed)
Physician Discharge Summary  Nathan [name] [id number] DOB: 1961-06-19 DOA: 06/24/2019  PCP: Derinda Late, MD  Admit date: 06/24/2019 Discharge date: 06/30/2019  Admitted From: Home  Disposition: Home   Recommendations for Outpatient Follow-up:  1. Follow up with PCP in 1-2 weeks 2. Please obtain BMP/CBC in one week 3. Follow up with [facility] for further care post VAST.  4. Follow up with ID for further care of bacteremia.  5. Follow up with nephrology, needs repeat renal function.   Home Health: none  Discharge Condition: Stable.  CODE STATUS: full code Diet recommendation: Heart Healthy  Brief/Interim Summary: 58 year old with past medical history significant for insulin-dependent diabetes, hypertension, A. fib on Eliquis noncompliant due to affordability issues, anemia of chronic disease, chronic kidney disease a stage III, right thumb cellulitis status post IND and MRSA bacteremia on Zyvox is a direct transfer from [facility] for concern of left hydropneumothorax.  Patient admitted on 06/19/2019 due to fluid overload anasarca his x-ray showed infiltrates and BNP was elevated.  Patient is started on antibiotics for concern of healthcare associated pneumonia vancomycin and Rocephin was started initially which was changed to Zyvox as per ID recommendation.  Patient was given IV Lasix for concern of acute on chronic CHF.  COVID-19 was negative.  Patient remained afebrile, no leukocytosis.  A CT chest showed collection of the left side  that could be empyema with a right upper lobe possible infiltrates.  Patient had left chest catheter placement by IR on 06/22/2019.   No real change on x-ray after placement of pigtail.  Dr. Adora Fridge discussed with Dr. Servando Snare thoracic surgery who recommended transfer to [facility] for possible VATS.  Repeat CT chest from 10//2020 showed moderate loculated left hydropneumothorax and a small to moderate right pleural effusion not  significantly changed in volume compared to prior examination. Patient underwent VATS procedure on 10/6  1-Left side hydropneumothorax, complex left side pleural effusion/empyema -Noted on CT. patient was transferred from [facility] to [facility] for VATS procedure. -Patient status post pigtail catheter placement by IR on 06/22/2019 at [facility]. -Patient underwent VATS on 06/26/2019 -CVTS recommend removing chest tube probably tomorrow.  -Plan to resume eliquis per CVTS/  -Follow recommendation for antibiotics tx length.  -chest tube removed 10-09. -Discussed with Dr Linus Salmons, plan to discharge on 2 more weeks of Zyvox. I will provided prescription for 7 days, patient has 7 days worth of zyvox at home.   2-Acute systolic heart failure: Patient presented with lower extremity edema and elevated BNP on 06/19/2019 -Echocardiogram show ejection fraction 40 to 45%.  TEE ejection fraction 50 to 55% -Patient received IV Lasix at [facility], currently on hold due to AKI -Continue to monitor output  -defer lasix to nephrology.   3-Hyperkalemia: Received  an amp of bicarb and Kayexalate one-time 1. Trending down, today at 4.7.  Received lokelma.   4-AKI on chronic kidney disease 3: Possible AKI, possible related to medication antibiotics. While at [facility] patient creatinine range 1.4-1.6. Creatinine was 1.25 August 2018 however improved with March and June 2020. Renal US negative for hydronephrosis.  Cr today stable at 2. o Treated with IV lasix. Transition to 40 mg BID. Ok to discharge today per nephrology.   5-History of right hand cellulitis with MRSA bacteremia MRSA abscess of the right thumb: -Infectious disease was following patient at [facility]. -Blood cultures from [facility] 06/18/2019 MRSA positive wound culture 9/21 MRSA positive. -Subsequent culture has been negative. -He was placed on  Zyvox 600 mg twice daily with his last dose 06/30/2019 -TEE was negative during previous  hospitalization.  6-A. fib, paroxysmal: Patient was on Eliquis, this was restarted at [facility]. Currently he is in sinus rhythm Continue with Lopressor. eliquis resume.   7-Hypertension: Continue to hold ACE  8-Diabetes type 2: Continue sliding scale insulin  9-Left upper  extremity edema: Doppler ordered, negative for DVT, positive for thrombophlebitis.   10-Anemia;  Follow trend.  Mild drop.  Started  ferrous sulfate.  Hb stable.      Discharge Diagnoses:  Principal Problem:   Hydropneumothorax Active Problems:   CKD (chronic kidney disease), stage III   Hypertension   Insulin dependent diabetes mellitus type IA (HCC)   Anemia of chronic disease   Atrial fibrillation, chronic (HCC)   Acute on chronic combined systolic and diastolic CHF (congestive heart failure) (HCC)   Cellulitis of right hand   AKI (acute kidney injury) [facility])    Discharge Instructions  Discharge Instructions    Diet - low sodium heart healthy   Complete by: As directed    Increase activity slowly   Complete by: As directed      Allergies as of 06/30/2019   No Known Allergies     Medication List    STOP taking these medications   metoprolol succinate 100 MG 24 hr tablet Commonly known as: TOPROL-XL   mupirocin ointment 2 % Commonly known as: BACTROBAN     TAKE these medications   amLODipine 5 MG tablet Commonly known as: NORVASC Take 1 tablet (5 mg total) by mouth daily.   apixaban 5 MG Tabs tablet Commonly known as: ELIQUIS Take 1 tablet (5 mg total) by mouth 2 (two) times daily.   ferrous sulfate 325 (65 FE) MG tablet Take 1 tablet (325 mg total) by mouth 2 (two) times daily with a meal.   furosemide 40 MG tablet Commonly known as: Lasix Take 1 tablet (40 mg total) by mouth 2 (two) times daily.   insulin glargine 100 UNIT/ML injection Commonly known as: LANTUS Inject 0.1 mLs (10 Units total) into the skin daily.   linezolid 600 MG tablet Commonly known as:  ZYVOX Take 1 tablet (600 mg total) by mouth every 12 (twelve) hours for 7 days.   ondansetron 4 MG tablet Commonly known as: Zofran Take 1 tablet (4 mg total) by mouth daily as needed for nausea or vomiting.   oxyCODONE-acetaminophen 5-325 MG tablet Commonly known as: Percocet Take 1 tablet by mouth every 6 (six) hours as needed for moderate pain or severe pain.      Follow-up Information    Theora Gianotti, NP Follow up on 07/27/2019.   Specialties: Nurse Practitioner, Cardiology, Radiology Why: 1:30PM Cardiology followup Contact information: [address] [place] [address] [phone]        Lajuana Matte, MD. Go on 07/06/2019.   Specialty: Thoracic Surgery Why: Office will call with appointment time. Contact information: [place] [address] [phone]        Tsosie Billing, MD Follow up.   Specialty: Infectious Diseases Why: call office to schedule appointment in 2 weeks.  Contact information: [place] [address] [phone]          No Known Allergies  Consultations:  CVT  ID   Procedures/Studies: Dg Chest 2 View  Result Date: 06/30/2019 CLINICAL DATA:  Chest tube removal. EXAM: CHEST - 2 VIEW COMPARISON:  Radiograph 06/28/2019  FINDINGS: Removal of LEFT chest tube. No appreciable pneumothorax. Small amount fluid is evident along the lateral margin of the LEFT lung. LEFT basilar atelectasis again noted with basilar effusion. RIGHT lung clear. IMPRESSION: 1. Removal of LEFT chest tube without appreciable pneumothorax. 2. Persistent LEFT effusion and basilar atelectasis. Electronically Signed   By: Suzy Bouchard M.D.   On: 06/30/2019 10:50   Dg Chest 2 View  Result Date: 06/24/2019 CLINICAL DATA:  Left-sided chest tube 3 days. EXAM: CHEST - 2 VIEW COMPARISON:  06/22/2019 FINDINGS: Left basilar pleural drainage catheter unchanged. Stable left base  opacification likely small to moderate effusion with associated basilar atelectasis. Right lung is clear. Cardiomediastinal silhouette and remainder of the exam is unchanged. IMPRESSION: Stable left base opacification likely small to moderate effusion with associated basilar atelectasis. Left basilar chest tube unchanged. Electronically Signed   By: Marin Olp M.D.   On: 06/24/2019 15:33   Dg Chest 2 View  Result Date: 06/23/2019 CLINICAL DATA:  Left-sided chest tube since yesterday. EXAM: CHEST - 2 VIEW COMPARISON:  06/21/2019 FINDINGS: Left basilar chest tube. Persistent partially loculated small left pleural effusion. Small right pleural effusion. Mild bilateral interstitial thickening likely chronic. Mild right basilar atelectasis. Trace left pneumothorax. Stable cardiomediastinal silhouette. No aggressive osseous lesion. IMPRESSION: 1. Left basilar chest tube with a trace pneumothorax. Partially loculated small left pleural effusion. Electronically Signed   By: Kathreen Devoid   On: 06/23/2019 09:29   Dg Chest 2 View  Result Date: 06/21/2019 CLINICAL DATA:  Pleural effusion EXAM: CHEST - 2 VIEW COMPARISON:  06/19/2019 FINDINGS: No significant change in moderate left, small right pleural effusions with associated atelectasis or consolidation of the left lung. There is some degree of underlying diffuse heterogeneous and interstitial bilateral airspace opacity suggesting multifocal infection or edema. The heart and mediastinum are unremarkable. IMPRESSION: No significant change in moderate left, small right pleural effusions with associated atelectasis or consolidation of the left lung. There is some degree of underlying diffuse heterogeneous and interstitial bilateral airspace opacity suggesting multifocal infection or edema. CT may be helpful to further assess. Electronically Signed   By: Eddie Candle M.D.   On: 06/21/2019 09:57   Dg Chest 2 View  Result Date: 06/19/2019 CLINICAL DATA:  Shortness of  breath EXAM: CHEST - 2 VIEW COMPARISON:  None. FINDINGS: There is airspace consolidation throughout much of the left lower lobe with small left pleural effusion. There is a subtle ill-defined area of opacity in the right upper lobe, seen only on the frontal view. Lungs elsewhere clear. Heart size and pulmonary vascularity are normal. No adenopathy. No bone lesions. IMPRESSION: 1. Extensive left lower lobe airspace consolidation consistent with pneumonia with small left pleural effusion. 2. Subtle area of opacity in the right upper lobe measuring 2.1 x 1.7 cm. Question small focus of pneumonia in this area. This area does appear somewhat nodular. 3.  Heart size within normal limits. 4.  No evident adenopathy. Followup PA and lateral chest radiographs recommended in 3-4 weeks following trial of antibiotic therapy to ensure resolution and exclude underlying malignancy. Electronically Signed   By: Lowella Grip III M.D.   On: 06/19/2019 14:14   Ct Chest Wo Contrast  Result Date: 06/24/2019 CLINICAL DATA:  Loculated pleural effusion, status post CT-guided drain placement EXAM: CT CHEST WITHOUT CONTRAST TECHNIQUE: Multidetector CT imaging of the chest was performed following the standard protocol without IV contrast. COMPARISON:  06/21/2019 FINDINGS: Cardiovascular: Three-vessel coronary artery calcifications and/or stents. Mild cardiomegaly. No  pericardial effusion. Mediastinum/Nodes: No significant change in numerous enlarged mediastinal and hilar lymph nodes. Thyroid gland, trachea, and esophagus demonstrate no significant findings. Lungs/Pleura: Redemonstrated moderate, loculated left hydropneumothorax and small to moderate right pleural effusion, not significantly changed in volume compared to prior examination. There has been interval CT-guided placement of a pigtail chest tube about the dependent left lung base, with introduction of a small pneumothorax component. The left-sided pleura are thickened and  rinded appearing and there is atelectasis or consolidation of the dependent lungs, left greater than right. There appear to be at least three substantial loculated components of the left-sided effusion, posteriorly, about the lung base, and within the major fissure (series 6, image 145). There is diffuse interlobular septal thickening (series 3, image 30). Upper Abdomen: Trace ascites in the upper abdomen. Musculoskeletal: No chest wall mass or suspicious bone lesions identified. Anasarca. IMPRESSION: 1. Redemonstrated moderate, loculated left hydropneumothorax and small to moderate right pleural effusion, not significantly changed in volume compared to prior examination. There has been interval CT-guided placement of a pigtail chest tube about the dependent left lung base, with introduction of a small pneumothorax component. 2. The left-sided pleura are thickened and rinded appearing and there is atelectasis or consolidation of the dependent lungs, left greater than right. There appear to be at least three substantial loculated components of the left-sided effusion, posteriorly, about the lung base, and within the major fissure (series 6, image 145). 3. There is diffuse interlobular septal thickening (series 3, image 30), consistent with pulmonary edema. 4. No significant change in numerous enlarged mediastinal and hilar lymph nodes, likely reactive. 5.  Anasarca and ascites in the upper abdomen. 6.  Coronary artery disease. Electronically Signed   By: Eddie Candle M.D.   On: 06/24/2019 12:59   Ct Chest Wo Contrast  Result Date: 06/21/2019 CLINICAL DATA:  Chest x-ray today showed small right pleural effusions with associated atelectasis or consolidation of the left lung. There is some degree of underlying diffuse heterogeneous and interstitial bilateral airspace opacity suggesting multifocal infection or edema. EXAM: CT CHEST WITHOUT CONTRAST TECHNIQUE: Multidetector CT imaging of the chest was performed  following the standard protocol without IV contrast. COMPARISON:  Chest x-ray on 06/21/2019 FINDINGS: Cardiovascular: Heart is mildly enlarged. There is coronary artery calcification. Small pericardial effusion measures less than 5 millimeters. There is atherosclerotic calcification of the thoracic aorta not associated with aneurysm. Mediastinum/Nodes: The visualized portion of the thyroid gland has a normal appearance. Multiple enlarged mediastinal and hilar lymph nodes are present. Prevascular lymph node is 1.4 centimeters on image 71/2. Precarinal lymph node is 1.2 centimeters on image 58/2. Esophagus is normal in appearance. Lungs/Pleura: Bilateral pleural effusions. RIGHT pleural effusion appears simple. Effusion in the LEFT lung appears loculated and somewhat irregular. The findings raise a question possible empyema. Lack of intravenous contrast limits full evaluation. There is consolidation in the LEFT LOWER lobe and to a lesser degree in the RIGHT LOWER lobe. There are patchy ground-glass opacities within the RIGHT UPPER lobe, somewhat confluent in areas and favoring infectious process. There is subpleural septal thickening primarily within the UPPER lobes. Upper Abdomen: Small amount of perihepatic fluid. Gallbladder is present. Musculoskeletal: No chest wall mass or suspicious bone lesions identified. IMPRESSION: 1. Cardiomegaly and coronary artery disease. 2. Small pericardial effusion. 3. Bilateral pleural effusions, left greater than RIGHT. Possible LEFT empyema. 4. Bilateral lower lobe consolidation and ground-glass opacities within the RIGHT UPPER lobe, consistent with infectious process. 5. Mediastinal and hilar adenopathy, likely reactive.  6. Mild pulmonary edema. 7. Small amount of perihepatic fluid. 8. Aortic Atherosclerosis (ICD10-I70.0). Electronically Signed   By: Nolon Nations M.D.   On: 06/21/2019 14:21   Ct Hand Right W Contrast  Result Date: 06/11/2019 CLINICAL DATA:  Sepsis. Right  hand swelling. EXAM: CT OF THE UPPER RIGHT EXTREMITY WITH CONTRAST TECHNIQUE: Multidetector CT imaging of the upper right extremity was performed according to the standard protocol following intravenous contrast administration. COMPARISON:  Radiographs dated 06/10/2019 CONTRAST:  110mL OMNIPAQUE IOHEXOL 300 MG/ML  SOLN FINDINGS: Muscles and Tendons and soft tissues There is an extensive abscess extending from the dorsal aspect of the base of the thumb at the site of the soft tissue ulcer near the base of the proximal phalanx. The abscess extends into the palm involving the abductor pollicis brevis and adductor pollicis muscles extending across the palm extending to the volar surface of the head of the third metacarpal best seen on image 85 of series 6. The abscess is at least 6.3 x 3.4 x 2.3 cm. The mass is deep to the flexor tendons of first, second and third digits at the level of the metacarpals. Bones/Joint/Cartilage No evidence of osteomyelitis or other acute abnormality. IMPRESSION: 1. Extensive abscess in the palm extending from the dorsal aspect of the base of the thumb to the volar surface of the head of the third metacarpal. 2. No evidence of osteomyelitis. Electronically Signed   By: Lorriane Shire M.D.   On: 06/11/2019 10:15   US Renal  Result Date: 06/27/2019 CLINICAL DATA:  Hypertension.  Diabetes.  Acute kidney injury. EXAM: RENAL / URINARY TRACT ULTRASOUND COMPLETE COMPARISON:  None. FINDINGS: Right Kidney: Renal measurements: 10.8 x 6.5 x 6.1 cm = volume: 243 mL . Echogenicity within normal limits. No mass or hydronephrosis visualized. Left Kidney: Renal measurements: 10.8 x 6.1 x 5.2 cm = volume: 177 mL. Echogenicity within normal limits. No mass or hydronephrosis visualized. Bladder: Decompressed. Note: Ascites and right pleural effusion evident. IMPRESSION: 1. No hydronephrosis. 2. Ascites with right pleural effusion. Electronically Signed   By: Misty Stanley M.D.   On: 06/27/2019 16:31    Dg Chest Port 1 View  Result Date: 06/28/2019 CLINICAL DATA:  Empyema status post decortication and chest tube placement. EXAM: PORTABLE CHEST 1 VIEW COMPARISON:  Chest x-ray 06/27/2019 and chest CT 06/24/2019 FINDINGS: The left-sided chest tube is stable. No pneumothorax. Small amount of residual subcutaneous emphysema. The right IJ catheter is in good position, unchanged. The cardiac silhouette, mediastinal and hilar contours are within normal limits and stable. Stable small left effusion and left lower lobe atelectasis/infiltrate. The right lung is clear. IMPRESSION: 1. Stable left-sided chest tube without pneumothorax. 2. Persistent small left effusion and left lower lobe atelectasis/infiltrate. Electronically Signed   By: Marijo Sanes M.D.   On: 06/28/2019 10:45   Dg Chest Port 1 View  Result Date: 06/27/2019 CLINICAL DATA:  Empyema status post VATS EXAM: PORTABLE CHEST 1 VIEW COMPARISON:  06/26/2019 FINDINGS: The left-sided chest tube is stable. No definite pneumothorax. No residual pleural effusion. The right IJ catheter is stable. Improved lung aeration bilaterally with clearing postoperative atelectasis. IMPRESSION: 1. Support apparatus in good position, unchanged. 2. Improved lung aeration with clearing atelectasis. 3. No pneumothorax or recurrent effusion on the left. Electronically Signed   By: Marijo Sanes M.D.   On: 06/27/2019 08:08   Dg Chest Port 1 View  Result Date: 06/26/2019 CLINICAL DATA:  Empyema, postoperative EXAM: PORTABLE CHEST 1 VIEW COMPARISON:  06/26/2019,  7:38 a.m. FINDINGS: Interval postoperative findings of left thoracotomy with left-sided chest tube and improved aeration of the left lung base. A previously seen pigtail chest catheter about the left lung base has been removed. No significant pneumothorax. There is new atelectasis or consolidation of the inferior right upper lobe. Interval placement of a right neck vascular catheter, tip positioned near the superior  cavoatrial junction. The heart mediastinum are unremarkable. IMPRESSION: 1. Interval postoperative findings of left thoracotomy with left-sided chest tube and improved aeration of the left lung base. A previously seen pigtail chest catheter about the left lung base has been removed. No significant pneumothorax. 2. There is new atelectasis or consolidation of the inferior right upper lobe. 3. Interval placement of a right neck vascular catheter, tip positioned near the superior cavoatrial junction. Electronically Signed   By: Eddie Candle M.D.   On: 06/26/2019 16:43   Dg Chest Port 1 View  Result Date: 06/26/2019 CLINICAL DATA:  Preop for video-assisted thoracic surgery. EXAM: PORTABLE CHEST 1 VIEW COMPARISON:  Radiographs of June 24, 2019. FINDINGS: Stable cardiomediastinal silhouette. No pneumothorax is noted. Improved left basilar opacity is noted suggesting improving atelectasis and effusion. Stable position of left-sided pleural drainage catheter. Right lung is unremarkable. Bony thorax is unremarkable. IMPRESSION: Stable position of left-sided pleural drainage catheter. Improved left basilar atelectasis and effusion is noted. Electronically Signed   By: Marijo Conception M.D.   On: 06/26/2019 10:03   Dg Hand Complete Right  Result Date: 06/10/2019 CLINICAL DATA:  Infection in right thumb EXAM: RIGHT HAND - COMPLETE 3+ VIEW COMPARISON:  None. FINDINGS: No fracture or dislocation of the right hand. Joint spaces are well preserved. Soft tissue edema about the right thumb. IMPRESSION: Soft tissue edema about the right thumb. No fracture, dislocation, or other osseous abnormality. Electronically Signed   By: Eddie Candle M.D.   On: 06/10/2019 17:14   Ct Image Guided Fluid Drain By Catheter  Result Date: 06/22/2019 INDICATION: Loculated left effusion, concern for empyema EXAM: CT guided 12 French left chest tube insertion MEDICATIONS: The patient is currently admitted to the hospital and receiving  intravenous antibiotics. The antibiotics were administered within an appropriate time frame prior to the initiation of the procedure. ANESTHESIA/SEDATION: Fentanyl 50 mcg IV; Versed 1.0 mg IV Moderate Sedation Time:  16 minutes The patient was continuously monitored during the procedure by the interventional radiology nurse under my direct supervision. COMPLICATIONS: None immediate. PROCEDURE: Informed written consent was obtained from the patient after a thorough discussion of the procedural risks, benefits and alternatives. All questions were addressed. Maximal Sterile Barrier Technique was utilized including caps, mask, sterile gowns, sterile gloves, sterile drape, hand hygiene and skin antiseptic. A timeout was performed prior to the initiation of the procedure. Previous imaging reviewed. Patient positioned right side down decubitus. Noncontrast localization CT performed. The loculated left effusion was localized through a mid axillary lower intercostal space. Overlying skin marked. Under sterile conditions and local anesthesia, an 18 gauge 10 cm access needle was advanced into the effusion. Needle position confirmed with CT. Syringe aspiration yielded serous fluid. Sample sent for culture. Guidewire inserted followed by tract dilatation to insert a 12 [place] drain. Drain catheter position confirmed with CT. Catheter secured with Prolene suture and connected to external pleura vac. Sterile dressing applied. No immediate complication. Patient tolerated the procedure well. IMPRESSION: Successful CT-guided 12 French left chest tube insertion. Electronically Signed   By: Jerilynn Mages.  Shick M.D.   On: 06/22/2019 10:48   Vas  [place] Upper Extremity Venous Duplex  Result Date: 06/29/2019 UPPER VENOUS STUDY  Indications: Edema Comparison Study: No priors. Performing Technologist: Oda Cogan RDMS, RVT  Examination Guidelines: A complete evaluation includes B-mode imaging, spectral Doppler, color Doppler, and power Doppler as  needed of all accessible portions of each vessel. Bilateral testing is considered an integral part of a complete examination. Limited examinations for reoccurring indications may be performed as noted.  Right Findings: +----------+------------+---------+-----------+----------+-------+ RIGHT     CompressiblePhasicitySpontaneousPropertiesSummary +----------+------------+---------+-----------+----------+-------+ Subclavian    Full       Yes       Yes                      +----------+------------+---------+-----------+----------+-------+  Left Findings: +----------+------------+---------+-----------+----------+-------+ LEFT      CompressiblePhasicitySpontaneousPropertiesSummary +----------+------------+---------+-----------+----------+-------+ IJV           Full       Yes       Yes                      +----------+------------+---------+-----------+----------+-------+ Subclavian    Full       Yes       Yes                      +----------+------------+---------+-----------+----------+-------+ Axillary      Full       Yes       Yes                      +----------+------------+---------+-----------+----------+-------+ Brachial      Full       Yes       Yes                      +----------+------------+---------+-----------+----------+-------+ Radial        Full                                          +----------+------------+---------+-----------+----------+-------+ Ulnar         Full                                          +----------+------------+---------+-----------+----------+-------+ Cephalic    Partial                                  Acute  +----------+------------+---------+-----------+----------+-------+ Localized superficial thrombus in the cephalic vein at the antecubital location. All other veins are patent.  Summary:  Right: No evidence of thrombosis in the subclavian.  Left: No evidence of deep vein thrombosis in the upper extremity.  Findings consistent with acute superficial vein thrombosis involving the left cephalic vein.  *See table(s) above for measurements and observations.    Preliminary      Subjective: Report nausea. He wants to go home.  Plan to discharge if he is able to tolerates diet.   Discharge Exam: Vitals:   06/30/19 0325 06/30/19 1139  BP: (!) 165/87 (!) 164/86  Pulse: 88 89  Resp:  16  Temp: 98 F (36.7 C) 98.4 F (36.9 C)  SpO2: 97% 93%     General: Pt is alert, awake, not in acute distress Cardiovascular: RRR, S1/S2 +, no  rubs, no gallops Respiratory: CTA bilaterally, no wheezing, no rhonchi Abdominal: Soft, NT, ND, bowel sounds + Extremities: no edema, no cyanosis    The results of significant diagnostics from this hospitalization (including imaging, microbiology, ancillary and laboratory) are listed below for reference.     Microbiology: Recent Results (from the past 240 hour(s))  CULTURE, BLOOD (ROUTINE X 2) w Reflex to ID Panel     Status: None   Collection Time: 06/22/19 12:22 AM   Specimen: BLOOD  Result Value Ref Range Status   Specimen Description BLOOD BLOOD LEFT HAND  Final   Special Requests   Final    BOTTLES DRAWN AEROBIC AND ANAEROBIC Blood Culture results may not be optimal due to an excessive volume of blood received in culture bottles   Culture   Final    NO GROWTH 5 DAYS Performed at [facility], [address] [place], [place] [address]    Report Status 06/27/2019 FINAL  Final  CULTURE, BLOOD (ROUTINE X 2) w Reflex to ID Panel     Status: None   Collection Time: 06/22/19 12:22 AM   Specimen: BLOOD  Result Value Ref Range Status   Specimen Description BLOOD RIGHT ANTECUBITAL  Final   Special Requests   Final    BOTTLES DRAWN AEROBIC AND ANAEROBIC Blood Culture adequate volume   Culture   Final    NO GROWTH 5 DAYS Performed at [facility], [address], [place], [place] [address]    Report Status 06/27/2019 FINAL  Final  Body  fluid culture (includes gram stain)     Status: None   Collection Time: 06/22/19 10:18 AM   Specimen: Pleural Fluid  Result Value Ref Range Status   Specimen Description   Final    PLEURAL Performed at [facility], [address] [place], [place] [address]    Special Requests   Final    NONE Performed at [facility], [address] [place], [place] [address]    Gram Stain   Final    RARE WBC PRESENT,BOTH PMN AND MONONUCLEAR NO ORGANISMS SEEN    Culture   Final    NO GROWTH Performed at [facility], [place] [address], [place], [place] [address]    Report Status 06/25/2019 FINAL  Final  Aerobic/Anaerobic Culture (surgical/deep wound)     Status: None   Collection Time: 06/22/19 10:18 AM   Specimen: Pleural Fluid  Result Value Ref Range Status   Specimen Description   Final    PLEURAL Performed at [facility], [address] [place], [place] [address]    Special Requests   Final    Normal Performed at [facility], [address] [place], [place] [address]    Gram Stain   Final    RARE WBC PRESENT, PREDOMINANTLY MONONUCLEAR NO ORGANISMS SEEN    Culture   Final    No growth aerobically or anaerobically. Performed at [facility], [place] [address], [place], [place] [address]    Report Status 06/27/2019 FINAL  Final  Surgical pcr screen     Status: Abnormal   Collection Time: 06/25/19  8:39 AM   Specimen: Nasal Mucosa; Nasal Swab  Result Value Ref Range Status   MRSA, PCR NEGATIVE NEGATIVE Final   Staphylococcus aureus POSITIVE (A) NEGATIVE Final    Comment: (NOTE) The Xpert SA Assay (FDA approved for NASAL specimens in patients 65 years of age and older), is one component of  a comprehensive surveillance program. It is not intended to diagnose infection nor to guide or monitor treatment. Performed at [facility], [place] [address] [place], [place] [address]   Gram stain     Status: None   Collection Time:  06/26/19  2:06 PM   Specimen: PATH Cytology Pleural fluid; Body Fluid  Result Value Ref Range Status   Specimen Description FLUID PLEURAL  Final   Special Requests NONE  Final   Gram Stain   Final    RARE WBC PRESENT, PREDOMINANTLY MONONUCLEAR NO ORGANISMS SEEN Performed at [facility], [address] [place], [place] [address]    Report Status 06/26/2019 FINAL  Final  Fungus Culture With Stain     Status: None (Preliminary result)   Collection Time: 06/26/19  2:06 PM   Specimen: PATH Cytology Pleural fluid; Body Fluid  Result Value Ref Range Status   Fungus Stain Final report  Final    Comment: (NOTE) Performed At: [facility] [place], [place] [id number] Rush Farmer MD [id number]    Fungus (Mycology) Culture PENDING  Incomplete   Fungal Source FLUID  Final    Comment: PLEURAL Performed at [facility], [place] [address] [place], [place] [address]   Acid Fast Smear (AFB)     Status: None   Collection Time: 06/26/19  2:06 PM   Specimen: PATH Cytology Pleural fluid; Body Fluid  Result Value Ref Range Status   AFB Specimen Processing Concentration  Final   Acid Fast Smear Negative  Final    Comment: (NOTE) Performed At: [facility] [place], [place] [id number] Rush Farmer MD [id number]    Source (AFB) FLUID  Final    Comment: PLEURAL Performed at [facility], [place] [address] [place], [place] [address]   Fungus Culture Result     Status: None   Collection Time: 06/26/19  2:06 PM  Result Value Ref Range Status   Result 1 Comment  Final    Comment: (NOTE) KOH/Calcofluor preparation:  no fungus observed. Performed At: [facility] [place], [place] [id number] Rush Farmer MD [id number]   Aerobic/Anaerobic Culture (surgical/deep wound)     Status: None (Preliminary result)   Collection Time: 06/26/19  2:12 PM   Specimen: Pleural, Left; Lung  Result Value Ref Range  Status   Specimen Description FLUID LEFT PLEURAL  Final   Special Requests PEEL  Final   Gram Stain   Final    RARE WBC PRESENT, PREDOMINANTLY MONONUCLEAR NO ORGANISMS SEEN    Culture   Final    RARE METHICILLIN RESISTANT STAPHYLOCOCCUS AUREUS NO ANAEROBES ISOLATED; CULTURE IN PROGRESS FOR 5 DAYS CRITICAL RESULT CALLED TO, READ BACK BY AND VERIFIED WITH: DMerian Capron RN, AT [id number] BY D. VANHOOK REGARDING CULTURE GROWTH Performed at [facility], [place] [address] [place], [place] [address]    Report Status PENDING  Incomplete   Organism ID, Bacteria METHICILLIN RESISTANT STAPHYLOCOCCUS AUREUS  Final      Susceptibility   Methicillin resistant staphylococcus aureus - MIC*    CIPROFLOXACIN >=8 RESISTANT Resistant     ERYTHROMYCIN >=8 RESISTANT Resistant     GENTAMICIN <=0.5 SENSITIVE Sensitive     OXACILLIN >=4 RESISTANT Resistant     TETRACYCLINE <=1 SENSITIVE Sensitive     VANCOMYCIN 1 SENSITIVE Sensitive     TRIMETH/SULFA <=10 SENSITIVE Sensitive     CLINDAMYCIN <=0.25 SENSITIVE Sensitive  RIFAMPIN <=0.5 SENSITIVE Sensitive     Inducible Clindamycin NEGATIVE Sensitive     * RARE METHICILLIN RESISTANT STAPHYLOCOCCUS AUREUS  Acid Fast Smear (AFB)     Status: None   Collection Time: 06/26/19  2:12 PM   Specimen: Pleural, Left; Lung  Result Value Ref Range Status   AFB Specimen Processing Comment  Final    Comment: Tissue Grinding and Digestion/Decontamination   Acid Fast Smear Negative  Final    Comment: (NOTE) Performed At: [facility] [place], [place] [id number] Rush Farmer MD [id number]    Source (AFB) FLUID  Final    Comment: LEFT PLEURAL PEEL Performed at [facility], [place] [address], [place], [place] [address]      Labs: BNP (last 3 results) Recent Labs    06/19/19 1750  BNP [id number]*   Basic Metabolic Panel: Recent Labs  Lab 06/25/19 0430  06/27/19 0500  06/27/19 1215 06/27/19 1734 06/28/19 0320  06/29/19 0336 06/30/19 0321  NA 142   < > 141   < > 144 143 143 141 143  K 5.7*   < > 6.0*   < > 5.9* 5.3* 4.7 3.7 3.8  CL 114*   < > 115*   < > 114* 112* 110 108 108  CO2 22   < > 22   < > 22 22 24 24 24   GLUCOSE 170*   < > 112*   < > 178* 147* 99 89 132*  BUN 64*   < > 65*   < > 68* 69* 71* 63* 57*  CREATININE 1.87*   < > 1.89*   < > 2.10* 2.23* 2.12* 2.13* 1.83*  CALCIUM 7.8*   < > 7.7*   < > 7.7* 8.0* 7.8* 7.7* 7.9*  MG 2.5*  --   --   --   --   --   --   --   --   PHOS 4.3  --  5.8*  --   --   --  4.7* 4.7* 3.6   < > = values in this interval not displayed.   Liver Function Tests: Recent Labs  Lab 06/25/19 1810 06/27/19 0500 06/28/19 0320 06/29/19 0336 06/30/19 0321  AST 21  --  35  --   --   ALT 20  --  37  --   --   ALKPHOS 253*  --  208*  --   --   BILITOT 0.8  --  0.7  --   --   PROT 5.5*  --  5.1*  --   --   ALBUMIN 2.2* 2.4* 2.4*  2.3* 2.3* 2.3*   No results for input(s): LIPASE, AMYLASE in the last 168 hours. No results for input(s): AMMONIA in the last 168 hours. CBC: Recent Labs  Lab 06/24/19 0707  06/25/19 1810 06/26/19 0409 06/26/19 1359 06/27/19 0500 06/28/19 0320 06/29/19 0336 06/30/19 0321  WBC 6.7   < > 8.2 6.6  --  10.4 8.6  --  7.0  NEUTROABS 5.1  --   --   --   --   --   --   --   --   HGB 9.0*   < > 9.3* 8.6* 7.1* 8.8* 7.8* 7.7* 8.1*  HCT 28.3*   < > 28.7* 27.9* 21.0* 26.9* 24.1* 23.7* 24.6*  MCV 92.2   < > 92.9 93.3  --  92.8 92.0  --  90.1  PLT 455*   < >  389 391  --  311 235  --  188   < > = values in this interval not displayed.   Cardiac Enzymes: No results for input(s): CKTOTAL, CKMB, CKMBINDEX, TROPONINI in the last 168 hours. BNP: Invalid input(s): POCBNP CBG: Recent Labs  Lab 06/29/19 1147 06/29/19 1621 06/29/19 2116 06/30/19 0615 06/30/19 1137  GLUCAP 149* 127* 117* 121* 122*   D-Dimer No results for input(s): DDIMER in the last 72 hours. Hgb A1c No results for input(s): HGBA1C in the last 72 hours. Lipid  Profile No results for input(s): CHOL, HDL, LDLCALC, TRIG, CHOLHDL, LDLDIRECT in the last 72 hours. Thyroid function studies No results for input(s): TSH, T4TOTAL, T3FREE, THYROIDAB in the last 72 hours.  Invalid input(s): FREET3 Anemia work up No results for input(s): VITAMINB12, FOLATE, FERRITIN, TIBC, IRON, RETICCTPCT in the last 72 hours. Urinalysis    Component Value Date/Time   COLORURINE STRAW (A) 06/28/2019 0945   APPEARANCEUR CLEAR 06/28/2019 0945   LABSPEC 1.009 06/28/2019 0945   PHURINE 5.0 06/28/2019 0945   GLUCOSEU NEGATIVE 06/28/2019 0945   HGBUR SMALL (A) 06/28/2019 0945   BILIRUBINUR NEGATIVE 06/28/2019 0945   KETONESUR NEGATIVE 06/28/2019 0945   PROTEINUR 30 (A) 06/28/2019 0945   NITRITE NEGATIVE 06/28/2019 0945   LEUKOCYTESUR NEGATIVE 06/28/2019 0945   Sepsis Labs Invalid input(s): PROCALCITONIN,  WBC,  LACTICIDVEN Microbiology Recent Results (from the past 240 hour(s))  CULTURE, BLOOD (ROUTINE X 2) w Reflex to ID Panel     Status: None   Collection Time: 06/22/19 12:22 AM   Specimen: BLOOD  Result Value Ref Range Status   Specimen Description BLOOD BLOOD LEFT HAND  Final   Special Requests   Final    BOTTLES DRAWN AEROBIC AND ANAEROBIC Blood Culture results may not be optimal due to an excessive volume of blood received in culture bottles   Culture   Final    NO GROWTH 5 DAYS Performed at [facility], [address] [place], [place] [address]    Report Status 06/27/2019 FINAL  Final  CULTURE, BLOOD (ROUTINE X 2) w Reflex to ID Panel     Status: None   Collection Time: 06/22/19 12:22 AM   Specimen: BLOOD  Result Value Ref Range Status   Specimen Description BLOOD RIGHT ANTECUBITAL  Final   Special Requests   Final    BOTTLES DRAWN AEROBIC AND ANAEROBIC Blood Culture adequate volume   Culture   Final    NO GROWTH 5 DAYS Performed at [facility], [address] [place], [place] [address]    Report Status 06/27/2019 FINAL  Final   Body fluid culture (includes gram stain)     Status: None   Collection Time: 06/22/19 10:18 AM   Specimen: Pleural Fluid  Result Value Ref Range Status   Specimen Description   Final    PLEURAL Performed at [facility], [address] [place], [place] [address]    Special Requests   Final    NONE Performed at [facility], [address] [place], [place] [address]    Gram Stain   Final    RARE WBC PRESENT,BOTH PMN AND MONONUCLEAR NO ORGANISMS SEEN    Culture   Final    NO GROWTH Performed at [facility], [place] [address] [place], [place] [address]    Report Status 06/25/2019 FINAL  Final  Aerobic/Anaerobic Culture (surgical/deep wound)     Status: None   Collection Time: 06/22/19 10:18 AM  Specimen: Pleural Fluid  Result Value Ref Range Status   Specimen Description   Final    PLEURAL Performed at [facility], [address], [place], [place] [address]    Special Requests   Final    Normal Performed at [facility], [address] [place], [place] [address]    Gram Stain   Final    RARE WBC PRESENT, PREDOMINANTLY MONONUCLEAR NO ORGANISMS SEEN    Culture   Final    No growth aerobically or anaerobically. Performed at [facility], [place] [address] [place], [place] [address]    Report Status 06/27/2019 FINAL  Final  Surgical pcr screen     Status: Abnormal   Collection Time: 06/25/19  8:39 AM   Specimen: Nasal Mucosa; Nasal Swab  Result Value Ref Range Status   MRSA, PCR NEGATIVE NEGATIVE Final   Staphylococcus aureus POSITIVE (A) NEGATIVE Final    Comment: (NOTE) The Xpert SA Assay (FDA approved for NASAL specimens in patients 18 years of age and older), is one component of a comprehensive surveillance program. It is not intended to diagnose infection nor to guide or monitor treatment. Performed at [facility], [place] [address], [place], [place] [address]   Gram stain     Status: None   Collection  Time: 06/26/19  2:06 PM   Specimen: PATH Cytology Pleural fluid; Body Fluid  Result Value Ref Range Status   Specimen Description FLUID PLEURAL  Final   Special Requests NONE  Final   Gram Stain   Final    RARE WBC PRESENT, PREDOMINANTLY MONONUCLEAR NO ORGANISMS SEEN Performed at [facility], [address] [place], [place] [address]    Report Status 06/26/2019 FINAL  Final  Fungus Culture With Stain     Status: None (Preliminary result)   Collection Time: 06/26/19  2:06 PM   Specimen: PATH Cytology Pleural fluid; Body Fluid  Result Value Ref Range Status   Fungus Stain Final report  Final    Comment: (NOTE) Performed At: [facility] [place], [place] [id number] Rush Farmer MD [id number]    Fungus (Mycology) Culture PENDING  Incomplete   Fungal Source FLUID  Final    Comment: PLEURAL Performed at [facility], [place] [address], [place], [place] [address]   Acid Fast Smear (AFB)     Status: None   Collection Time: 06/26/19  2:06 PM   Specimen: PATH Cytology Pleural fluid; Body Fluid  Result Value Ref Range Status   AFB Specimen Processing Concentration  Final   Acid Fast Smear Negative  Final    Comment: (NOTE) Performed At: [facility] [place], [place] [id number] Rush Farmer MD [id number]    Source (AFB) FLUID  Final    Comment: PLEURAL Performed at [facility], [place] [address], [place], [place] [address]   Fungus Culture Result     Status: None   Collection Time: 06/26/19  2:06 PM  Result Value Ref Range Status   Result 1 Comment  Final    Comment: (NOTE) KOH/Calcofluor preparation:  no fungus observed. Performed At: [facility] [place], [place] [id number] Rush Farmer MD [id number]   Aerobic/Anaerobic Culture (surgical/deep wound)     Status: None (Preliminary result)   Collection Time: 06/26/19  2:12 PM   Specimen: Pleural, Left; Lung  Result Value Ref  Range Status   Specimen Description FLUID LEFT PLEURAL  Final  Special Requests PEEL  Final   Gram Stain   Final    RARE WBC PRESENT, PREDOMINANTLY MONONUCLEAR NO ORGANISMS SEEN    Culture   Final    RARE METHICILLIN RESISTANT STAPHYLOCOCCUS AUREUS NO ANAEROBES ISOLATED; CULTURE IN PROGRESS FOR 5 DAYS CRITICAL RESULT CALLED TO, READ BACK BY AND VERIFIED WITH: D. SHAHBAZ RN, AT [id number] BY D. VANHOOK REGARDING CULTURE GROWTH Performed at [facility], [place] [address], [place], [place] [address]    Report Status PENDING  Incomplete   Organism ID, Bacteria METHICILLIN RESISTANT STAPHYLOCOCCUS AUREUS  Final      Susceptibility   Methicillin resistant staphylococcus aureus - MIC*    CIPROFLOXACIN >=8 RESISTANT Resistant     ERYTHROMYCIN >=8 RESISTANT Resistant     GENTAMICIN <=0.5 SENSITIVE Sensitive     OXACILLIN >=4 RESISTANT Resistant     TETRACYCLINE <=1 SENSITIVE Sensitive     VANCOMYCIN 1 SENSITIVE Sensitive     TRIMETH/SULFA <=10 SENSITIVE Sensitive     CLINDAMYCIN <=0.25 SENSITIVE Sensitive     RIFAMPIN <=0.5 SENSITIVE Sensitive     Inducible Clindamycin NEGATIVE Sensitive     * RARE METHICILLIN RESISTANT STAPHYLOCOCCUS AUREUS  Acid Fast Smear (AFB)     Status: None   Collection Time: 06/26/19  2:12 PM   Specimen: Pleural, Left; Lung  Result Value Ref Range Status   AFB Specimen Processing Comment  Final    Comment: Tissue Grinding and Digestion/Decontamination   Acid Fast Smear Negative  Final    Comment: (NOTE) Performed At: [facility] [place], [place] [id number] Rush Farmer MD [id number]    Source (AFB) FLUID  Final    Comment: LEFT PLEURAL PEEL Performed at [facility], [place] [address] [place], [place] [address]      Time coordinating discharge: 40 minutes  SIGNED:   Elmarie Shiley, MD  [facility]

## 2019-06-30 NOTE — Progress Notes (Signed)
Pt discharged today with family member.  Pt's IV removed and Pt taken off telemetry.  Pt left with all of their personal belongings.  AVS documentation was reviewed with the Pt and all questions were answered.

## 2019-06-30 NOTE — Progress Notes (Signed)
Subjective:   Only 800 of UOP recorded- he says not all of it, no up to date weights - crt improved to 1.8-  No Hyperkalemia BP is slightly high -  He reports nausea overnight but possibly better this AM   Objective Vital signs in last 24 hours: Vitals:   06/29/19 1416 06/29/19 1715 06/29/19 1959 06/30/19 0325  BP: (!) 153/81 (!) 164/86 (!) 148/73 (!) 165/87  Pulse: 76  86 88  Resp: 18     Temp: 98.2 F (36.8 C) 98.4 F (36.9 C) 98.2 F (36.8 C) 98 F (36.7 C)  TempSrc: Oral Oral Oral Oral  SpO2: 97% 96% 96% 97%  Weight:       Weight change:   Intake/Output Summary (Last 24 hours) at 06/30/2019 0856 Last data filed at 06/30/2019 0500 Gross per 24 hour  Intake 240 ml  Output 830 ml  Net -590 ml   Assessment/Plan: 58 year old male with  diabetes, hypertension-both of which are under poor control.  Likely has some baseline CKD-even though creatinine in June appeared normal it is an outlier.  Over the last 2 weeks he has had a prolonged hospitalization with infectious complications.  Creatinine is possibly above baseline but had been stable and indicated a GFR in the 40s 1.Renal-possibly some baseline CKD with creatinine in December 2019 1.6.  True that there is a creatinine in the system of 1.1 in June however that is the outlier.  Since hospitalized with infectious complications including a MRSA bacteremia-patient has had worsening of his CKD, however creatinine had been fairly stable in the 1.6-1.8 range indicating a GFR in the 40s until the VATS.  Then had  A on CRF in the setting of post op from VATS- crt over 2- now improved to 1.8.  No obvious hemodynamic issue but still suspect is the cause-  U/A is bland-  2. Hypertension/volume  -blood pressure slightly low post op.  Was on Toprol but appears to have been stopped.  Likely would be a candidate for ACE or ARB but would not start it until outpatient setting and without hyperkalemia.  He does appear to be volume overloaded but also  with a low albumin so is likely third spacing. started Lasix , has had some impact and also some post injury diureses.  Cont lasix for now- will transition to PO for OP management 3.  Hyperkalemia-mild and handled by the [facility].but now resolved, will stop 4. Anemia  -probably mostly due to hospitalization but could have some anemia of CKD.   iron stores ok,  Got a transfusion of one unit 10/6- got one dose of ESA on 10/9 darbe 100 5. Dispo-  I think he is fine to go home-  Would send him out on lasix 40 mg PO BID.  He wants to follow with nephrology in [place]-  I will reach out to them to get that arranged     Nathan [name]    Labs: Basic Metabolic Panel: Recent Labs  Lab 06/28/19 0320 06/29/19 0336 06/30/19 0321  NA 143 141 143  K 4.7 3.7 3.8  CL 110 108 108  CO2 24 24 24   GLUCOSE 99 89 132*  BUN 71* 63* 57*  CREATININE 2.12* 2.13* 1.83*  CALCIUM 7.8* 7.7* 7.9*  PHOS 4.7* 4.7* 3.6   Liver Function Tests: Recent Labs  Lab 06/25/19 1810  06/28/19 0320 06/29/19 0336 06/30/19 0321  AST 21  --  35  --   --   ALT 20  --  37  --   --   ALKPHOS 253*  --  208*  --   --   BILITOT 0.8  --  0.7  --   --   PROT 5.5*  --  5.1*  --   --   ALBUMIN 2.2*   < > 2.4*  2.3* 2.3* 2.3*   < > = values in this interval not displayed.   No results for input(s): LIPASE, AMYLASE in the last 168 hours. No results for input(s): AMMONIA in the last 168 hours. CBC: Recent Labs  Lab 06/24/19 0707  06/25/19 1810 06/26/19 0409  06/27/19 0500 06/28/19 0320 06/29/19 0336 06/30/19 0321  WBC 6.7   < > 8.2 6.6  --  10.4 8.6  --  7.0  NEUTROABS 5.1  --   --   --   --   --   --   --   --   HGB 9.0*   < > 9.3* 8.6*   < > 8.8* 7.8* 7.7* 8.1*  HCT 28.3*   < > 28.7* 27.9*   < > 26.9* 24.1* 23.7* 24.6*  MCV 92.2   < > 92.9 93.3  --  92.8 92.0  --  90.1  PLT 455*   < > 389 391  --  311 235  --  188   < > = values in this interval not displayed.   Cardiac Enzymes: No results for  input(s): CKTOTAL, CKMB, CKMBINDEX, TROPONINI in the last 168 hours. CBG: Recent Labs  Lab 06/29/19 0609 06/29/19 1147 06/29/19 1621 06/29/19 2116 06/30/19 0615  GLUCAP 99 149* 127* 117* 121*    Iron Studies:  No results for input(s): IRON, TIBC, TRANSFERRIN, FERRITIN in the last 72 hours. Studies/Results: Vas [place] Upper Extremity Venous Duplex  Result Date: 06/29/2019 UPPER VENOUS STUDY  Indications: Edema Comparison Study: No priors. Performing Technologist: Oda Cogan RDMS, RVT  Examination Guidelines: A complete evaluation includes B-mode imaging, spectral Doppler, color Doppler, and power Doppler as needed of all accessible portions of each vessel. Bilateral testing is considered an integral part of a complete examination. Limited examinations for reoccurring indications may be performed as noted.  Right Findings: +----------+------------+---------+-----------+----------+-------+ RIGHT     CompressiblePhasicitySpontaneousPropertiesSummary +----------+------------+---------+-----------+----------+-------+ Subclavian    Full       Yes       Yes                      +----------+------------+---------+-----------+----------+-------+  Left Findings: +----------+------------+---------+-----------+----------+-------+ LEFT      CompressiblePhasicitySpontaneousPropertiesSummary +----------+------------+---------+-----------+----------+-------+ IJV           Full       Yes       Yes                      +----------+------------+---------+-----------+----------+-------+ Subclavian    Full       Yes       Yes                      +----------+------------+---------+-----------+----------+-------+ Axillary      Full       Yes       Yes                      +----------+------------+---------+-----------+----------+-------+ Brachial      Full       Yes       Yes                      +----------+------------+---------+-----------+----------+-------+  Radial         Full                                          +----------+------------+---------+-----------+----------+-------+ Ulnar         Full                                          +----------+------------+---------+-----------+----------+-------+ Cephalic    Partial                                  Acute  +----------+------------+---------+-----------+----------+-------+ Localized superficial thrombus in the cephalic vein at the antecubital location. All other veins are patent.  Summary:  Right: No evidence of thrombosis in the subclavian.  Left: No evidence of deep vein thrombosis in the upper extremity. Findings consistent with acute superficial vein thrombosis involving the left cephalic vein.  *See table(s) above for measurements and observations.    Preliminary    Medications: Infusions:   Scheduled Medications: . acetaminophen  1,000 mg Oral Q6H   Or  . acetaminophen (TYLENOL) oral liquid 160 mg/5 mL  1,000 mg Oral Q6H  . apixaban  5 mg Oral BID  . bisacodyl  10 mg Oral Daily  . Chlorhexidine Gluconate Cloth  6 each Topical Daily  . ferrous sulfate  325 mg Oral BID WC  . furosemide  40 mg Intravenous Q12H  . insulin aspart  0-24 Units Subcutaneous TID AC & HS  . senna-docusate  1 tablet Oral QHS    have reviewed scheduled and prn medications.  Physical Exam: General: alert,  no c/o's  Heart: RRR Lungs: mostly clear- CT out  Abdomen: soft, non tender Extremities:  pitting edema to LE's - better     06/30/2019,8:56 AM  LOS: 6 days

## 2019-06-30 NOTE — Progress Notes (Signed)
[place] for Apixaban Indication: atrial fibrillation  No Known Allergies  Patient Measurements: Weight: 170 lb 3.1 oz (77.2 kg)  Vital Signs: Temp: 98 F (36.7 C) (10/10 0325) Temp Source: Oral (10/10 0325) BP: 165/87 (10/10 0325) Pulse Rate: 88 (10/10 0325)  Labs: Recent Labs    06/28/19 0320 06/29/19 0336 06/30/19 0321  HGB 7.8* 7.7* 8.1*  HCT 24.1* 23.7* 24.6*  PLT 235  --  188  CREATININE 2.12* 2.13* 1.83*    Estimated Creatinine Clearance: 45.1 mL/min (A) (by C-G formula based on SCr of 1.83 mg/dL (H)).   Medical History: Past Medical History:  Diagnosis Date  . Atrial fibrillation ([place])   . Diabetes mellitus without complication ([place])   . Hypertension     Assessment: 58 YOM with hx of Afib on Eliquis PTA presenting with left hydropneumothorax now post-op day 4 s/p VATS. Chest tube removed yesterday (10/9) and pharmacy consulted to restart Eliquis.  Hgb improved from 7.7 to 8.1 today, PLTs down to 188 from 235 two days ago. No overt bleeding noted. Patient has known CKD stage III (Scr > 1.5). Will restart home dose given age < 66 yo, weight > 60 kg.   Goal of Therapy:  Monitor platelets by anticoagulation protocol: Yes   Plan:  Restart Eliquis 5 mg PO BID Stop Lovenox Monitor CBC Q3 days Monitor for bleeding   Richardine Service, PharmD PGY1 Pharmacy Resident Phone: [phone] 06/30/2019  7:59 AM  Please check [email or web address] for unit-specific pharmacy phone numbers.

## 2019-06-30 NOTE — Progress Notes (Signed)
Patient has an episode of vomit denies pain. Zofran administered. We'll continue to monitor.Marland Kitchen

## 2019-07-01 LAB — AEROBIC/ANAEROBIC CULTURE W GRAM STAIN (SURGICAL/DEEP WOUND)

## 2019-07-05 ENCOUNTER — Telehealth: Payer: Self-pay | Admitting: Cardiovascular Disease

## 2019-07-05 ENCOUNTER — Other Ambulatory Visit: Payer: Self-pay | Admitting: Thoracic Surgery (Cardiothoracic Vascular Surgery)

## 2019-07-05 DIAGNOSIS — J948 Other specified pleural conditions: Secondary | ICD-10-CM

## 2019-07-05 NOTE — Telephone Encounter (Signed)
S/w Geni Bers from  [facility] she states that she needs to change quantity to 180 fax back to [phone]. Will await fax to fill out and return

## 2019-07-05 NOTE — Telephone Encounter (Signed)
° ° °  Pt c/o medication issue:  1. Name of Medication: Eliquis  2. How are you currently taking this medication (dosage and times per day)? n/a  3. Are you having a reaction (difficulty breathing--STAT)? n/a  4. What is your medication issue? Autumn from  [facility] calling to request additional information and change of quantity to180. Please call [phone]

## 2019-07-06 ENCOUNTER — Encounter: Payer: Self-pay | Admitting: Thoracic Surgery (Cardiothoracic Vascular Surgery)

## 2019-07-11 NOTE — Telephone Encounter (Signed)
Called and spoke with Rip Harbour and she will fax the form to my attention so that the quantity can be updated to what is needed.

## 2019-07-11 NOTE — Telephone Encounter (Signed)
[place]. The patient assistance form for Eliquis was completed by [facility]. They need the quantity updated to #180 R-3 instead of #60 R-11. The application will need to be updated and refaxed to [phone] include case # [id number].

## 2019-07-26 LAB — FUNGAL ORGANISM REFLEX

## 2019-07-26 LAB — FUNGUS CULTURE WITH STAIN

## 2019-07-26 LAB — FUNGUS CULTURE RESULT

## 2019-07-27 ENCOUNTER — Ambulatory Visit: Payer: Self-pay | Admitting: Nurse Practitioner

## 2019-07-27 ENCOUNTER — Encounter: Payer: Self-pay | Admitting: Nurse Practitioner

## 2019-07-27 DIAGNOSIS — I4891 Unspecified atrial fibrillation: Secondary | ICD-10-CM | POA: Insufficient documentation

## 2019-07-27 DIAGNOSIS — E1140 Type 2 diabetes mellitus with diabetic neuropathy, unspecified: Secondary | ICD-10-CM | POA: Insufficient documentation

## 2019-07-27 DIAGNOSIS — E119 Type 2 diabetes mellitus without complications: Secondary | ICD-10-CM | POA: Insufficient documentation

## 2019-07-27 DIAGNOSIS — I480 Paroxysmal atrial fibrillation: Secondary | ICD-10-CM | POA: Insufficient documentation

## 2019-07-27 NOTE — Progress Notes (Deleted)
Office Visit    Patient Name: Nathan [name] Date of Encounter: 07/27/2019  Primary Care Provider:  Derinda Late, MD Primary Cardiologist:  Ida Rogue, MD  Chief Complaint    ***  Past Medical History    Past Medical History:  Diagnosis Date  . Anemia of chronic disease   . Chronic combined systolic (congestive) and diastolic (congestive) heart failure ([place])    a. 05/2019 Echo: EF 45-50%, nl LA/RA size. Mild to mod TR.  . CKD (chronic kidney disease), stage III   . Diabetes mellitus without complication ([place])   . Hypertension   . Left Hydropneumothorax/Fibrinopurulent empyema    a. 06/2019 s/p VATS.  Marland Kitchen PAF (paroxysmal atrial fibrillation) ([place])    a. 05/2019 in setting of sepsis-->s/p TEE/DCCV; b. CHA2DS2VASc = 2-->Eliquis.  . Sepsis ([place])    a. 05/2019 MRSA in setting of infected R thumb.   Past Surgical History:  Procedure Laterality Date  . I&D EXTREMITY Right 06/11/2019   Procedure: IRRIGATION AND DEBRIDEMENT RIGHT THUMB;  Surgeon: Dereck Leep, MD;  Location: [facility] ORS;  Service: Orthopedics;  Laterality: Right;  . TEE WITHOUT CARDIOVERSION N/A 06/14/2019   Procedure: TRANSESOPHAGEAL ECHOCARDIOGRAM (TEE);  Surgeon: Minna Merritts, MD;  Location: [facility] ORS;  Service: Cardiovascular;  Laterality: N/A;  . VIDEO ASSISTED THORACOSCOPY (VATS)/THOROCOTOMY Left 06/26/2019   Procedure: VIDEO ASSISTED THORACOSCOPY DECORTICATION;  Surgeon: Lajuana Matte, MD;  Location: [facility];  Service: Thoracic;  Laterality: Left;  Marland Kitchen VIDEO BRONCHOSCOPY N/A 06/26/2019   Procedure: VIDEO BRONCHOSCOPY;  Surgeon: Lajuana Matte, MD;  Location: [facility] OR;  Service: Thoracic;  Laterality: N/A;    Allergies  No Known Allergies  History of Present Illness    ***  Home Medications    Prior to Admission medications   Medication Sig Start Date End Date Taking? Authorizing Provider  amLODipine (NORVASC) 5 MG tablet Take 1 tablet (5 mg total) by mouth daily. 06/25/19   Demetrios Loll, MD  apixaban (ELIQUIS) 5 MG TABS tablet Take 1 tablet (5 mg total) by mouth 2 (two) times daily. 06/30/19   Regalado, Belkys A, MD  ferrous sulfate 325 (65 FE) MG tablet Take 1 tablet (325 mg total) by mouth 2 (two) times daily with a meal. 06/30/19   Regalado, Belkys A, MD  furosemide (LASIX) 40 MG tablet Take 1 tablet (40 mg total) by mouth 2 (two) times daily. 06/30/19 06/29/20  Regalado, Belkys A, MD  insulin glargine (LANTUS) 100 UNIT/ML injection Inject 0.1 mLs (10 Units total) into the skin daily. 06/16/19   Epifanio Lesches, MD  ondansetron (ZOFRAN) 4 MG tablet Take 1 tablet (4 mg total) by mouth daily as needed for nausea or vomiting. 06/30/19 06/29/20  Regalado, Belkys A, MD  oxyCODONE-acetaminophen (PERCOCET) 5-325 MG tablet Take 1 tablet by mouth every 6 (six) hours as needed for moderate pain or severe pain. 06/16/19 06/15/20  Epifanio Lesches, MD    Review of Systems    ***.  All other systems reviewed and are otherwise negative except as noted above.  Physical Exam    VS:  There were no vitals taken for this visit. , BMI There is no height or weight on file to calculate BMI. GEN: Well nourished, well developed, in no acute distress. HEENT: normal. Neck: Supple, no JVD, carotid bruits, or masses. Cardiac: RRR, no murmurs, rubs, or gallops. No clubbing, cyanosis, edema.  Radials/DP/PT 2+ and equal bilaterally.  Respiratory:  Respirations regular and unlabored, clear to auscultation bilaterally.  GI: Soft, nontender, nondistended, BS + x 4. MS: no deformity or atrophy. Skin: warm and dry, no rash. Neuro:  Strength and sensation are intact. Psych: Normal affect.  Accessory Clinical Findings    ECG personally reviewed by me today - *** - no acute changes.  Lab Results  Component Value Date   WBC 7.0 06/30/2019   HGB 8.1 (L) 06/30/2019   HCT 24.6 (L) 06/30/2019   MCV 90.1 06/30/2019   PLT 188 06/30/2019   Lab Results  Component Value Date   CREATININE 1.83  (H) 06/30/2019   BUN 57 (H) 06/30/2019   NA 143 06/30/2019   K 3.8 06/30/2019   CL 108 06/30/2019   CO2 24 06/30/2019   Lab Results  Component Value Date   ALT 37 06/28/2019   AST 35 06/28/2019   ALKPHOS 208 (H) 06/28/2019   BILITOT 0.7 06/28/2019   Lab Results  Component Value Date   CHOL 138 06/26/2019   HDL 39 (L) 06/26/2019   LDLCALC 75 06/26/2019   TRIG 119 06/26/2019   CHOLHDL 3.5 06/26/2019     Assessment & Plan    1.  ***   Murray Hodgkins, NP 07/27/2019, 1:23 PM

## 2019-07-30 LAB — FUNGUS CULTURE RESULT

## 2019-07-30 LAB — FUNGUS CULTURE WITH STAIN

## 2019-07-30 LAB — FUNGAL ORGANISM REFLEX

## 2019-08-09 LAB — ACID FAST CULTURE WITH REFLEXED SENSITIVITIES (MYCOBACTERIA)
Acid Fast Culture: NEGATIVE
Acid Fast Culture: NEGATIVE

## 2019-08-29 ENCOUNTER — Telehealth: Payer: Self-pay | Admitting: Cardiovascular Disease

## 2019-08-29 NOTE — Telephone Encounter (Signed)
Spoke with Armed forces technical officer over at [facility] PCP office. They are seeing patient today and he has full body swelling and is heart failure patient. She reports they recommended he go to ED for further evaluation but he is refusing and wanted appointment in our office. Advised that he is scheduled on Friday but I do not see anything sooner and if his swelling is that bad then he should be seen in ED. Will keep appointment scheduled for now and she is going to relay message over to her provider that we did not have anything sooner.

## 2019-08-29 NOTE — Telephone Encounter (Signed)
Pt c/o swelling: STAT is pt has developed SOB within 24 hours  1) How much weight have you gained and in what time span? 191 previous weight 06/30/19 170   2) If swelling, where is the swelling located? Whole body   3) Are you currently taking a fluid pill? Yes   4) Are you currently SOB? Yes   5) Do you have a log of your daily weights (if so, list) no   6) Have you gained 3 pounds in a day or 5 pounds in a week?  Unknown   7) Have you traveled recently? Unknown

## 2019-08-30 ENCOUNTER — Telehealth: Payer: Self-pay | Admitting: Physician Assistant

## 2019-08-30 NOTE — Telephone Encounter (Signed)
Please contact patient. I was reviewing his chart in preparation for his visit with me on 12/11. I too agree, he should go to the ED for evaluation. It is very likely, I will need to recommend the same on 12/11. Therefore, he should go at this time.

## 2019-08-30 NOTE — Progress Notes (Signed)
Cardiology Office Note    Date:  08/31/2019   ID:  Nathan [name], DOB 1961/01/17, MRN [id number]  PCP:  Nathan Late, MD  Cardiologist:  Nathan Rogue, MD  Electrophysiologist:  None   Chief Complaint: Volume overload  History of Present Illness:   Nathan [name] is a 58 y.o. male with history of HFrEF, PAF, left hydropneumothorax status post VATS procedure in 06/2019, IDDM, CKD stage III, MRSA bacteremia, anemia of chronic disease, HTN, tobacco abuse, and medical noncompliance who presents for evaluation of volume overload.  Patient has reported his A. fib dates back to the early 2000's and was initially managed with aspirin though this was subsequently discontinued secondary to headache.  He had been lost to follow-up of until 05/2019.  He was admitted to the hospital in [name] 05/2019 with right hand cellulitis status post I&D complicated by MRSA bacteremia.  Surface echo showed an EF of 45 to 50%, normal RVSF, mild MR, mild to moderate TR, mild elevated PASP.  TEE showed no evidence of valvular vegetation with an EF of 50 to 55%.  Following this, he returned to the hospital in [name] 05/2019 with worsening bilateral lower extremity swelling, anasarca, and shortness of breath.  He was unsure of his baseline weight.  BNP was elevated at 3281.  Hemoglobin was down 4 g when compared to earlier in 2020 with a value of 8.  Chest x-ray showed left lower lobe consolidation and pleural effusion.  He was continued on previously recommend antibiotics and placed on IV Lasix with improvement in renal function.  CT chest on 10/1 showed possible left-sided empyema, bilateral lower lobe consolidation and groundglass opacities consistent with infectious process and likely reactive mediastinal and hilar adenopathy, mild pulmonary edema, small pericardial effusion, bilateral pleural effusions with the left being greater than the right, cardiomegaly, and CAD.  He underwent CT-guided chest tube placement.  He  was subsequently transferred to [facility] with concern of left hydropneumothorax.  Repeat chest CT showed a moderate loculated left hydropneumothorax and a small to moderate right pleural effusion.  He subsequently underwent VATS procedure on 06/26/2019.  With diuresis patient developed AKI leading his Lasix to be held.  He was hyperkalemic in the state and required an amp of bicarb, Kayexalate, and Lokelma. He was also noted to have left upper extremity edema during his admission with upper extremity ultrasound being negative for DVT with noted thrombophlebitis. Discharge hospital weight approximately 170.1 pounds.  He did not follow-up after hospital discharge until he was evaluated by his PCPs office on 08/29/2019 in which he had significant complaints of generalized swelling.  He indicated he did not follow-up with anyone post hospital because he was "too sick."  He indicated he was "full of fluid."  He had stopped most of his medications, though reported taking insulin and p.o. Lasix 40 mg twice daily.  BPs were ranging in the 170s over 110s.  He was having difficulty moving/walking/voiding secondary to swelling and dyspnea.  His weight was up 21 pounds from his hospital discharge weight with a documented of 191 pounds, which was up 35 pounds when compared to the last PCP visit in 02/2019.  It was recommended he go to the ED.  However, he refused this.  Stat labs were obtained and outlined as below.  Metolazone 5 mg daily for 3 days was added to his regimen.  Upon cardiology reviewing the chart on 08/30/2019 it was again recommended the patient proceed to the ED and he  was contacted by clinical staff with these recommendations.  He continued to refuse ED evaluation.  Patient comes in today continuing to note significant degree of volume overload though does report much improvement and dyspnea and palpable "swelling in my chest." He reports a weight loss of approximately 5 pounds over the past 3 days with  addition of metolazone 5 mg daily. However, patient was not taking metolazone 30 minutes prior to loop diuretic. He was taking this 3 hours prior to loop diuretic. He reports drinking less than 2 L of fluids per day and tries not to eat a diet high in sodium though does eat some cold cut meats, canned soups, and frozen meals. His left upper extremity swelling is stable and unchanged from his hospital admission in 06/2019. He denies any current chest pain, shortness of breath, palpitations, dizziness or presyncope, syncope, orthopnea, or PND. He does note significant lower extremity swelling, scrotal swelling, flank edema, and abdominal distention. He reports a dry weight approximately 155 to 165 pounds. He continues to refuse ED evaluation.  Patient's weight today is down 5 pounds compared to PCPs visit on 12/9 with a weight trend from 191 pounds to 186 pounds.  Current medications include: -Metolazone 5 mg daily x3 days (completed course today), patient did not take 30 minutes prior to loop diuretic (took 3 hours prior to loop diuretic) -Lasix 40 mg twice daily -Ferrous sulfate 325 mg daily -ASA 81 mg daily -Toprol-XL 100 mg daily -Multivitamin daily   Labs: 08/29/2019 - WBC 7.2, Hgb 11.3, PLT 430, glucose 64, sodium 146, potassium 3.7, BUN 26, serum creatinine 1.1, albumin 3.3, AST/ALT normal, BNP 3392 (30281 from 05/2019) 06/2019 - TC 138, TG 119, HDL 39, LDL 75, magnesium 2.5 08/2018 - TSH normal  Past Medical History:  Diagnosis Date   Anemia of chronic disease    Chronic combined systolic (congestive) and diastolic (congestive) heart failure ([place])    a. 05/2019 Echo: EF 45-50%, nl LA/RA size. Mild to mod TR.   CKD (chronic kidney disease), stage III    Diabetes mellitus without complication ([place])    Hypertension    Left Hydropneumothorax/Fibrinopurulent empyema    a. 06/2019 s/p VATS.   PAF (paroxysmal atrial fibrillation) ([place])    a. 05/2019 in setting of sepsis-->s/p TEE/DCCV;  b. CHA2DS2VASc = 2-->Eliquis.   Sepsis ([place])    a. 05/2019 MRSA in setting of infected R thumb.    Past Surgical History:  Procedure Laterality Date   I&D EXTREMITY Right 06/11/2019   Procedure: IRRIGATION AND DEBRIDEMENT RIGHT THUMB;  Surgeon: Dereck Leep, MD;  Location: [facility] ORS;  Service: Orthopedics;  Laterality: Right;   TEE WITHOUT CARDIOVERSION N/A 06/14/2019   Procedure: TRANSESOPHAGEAL ECHOCARDIOGRAM (TEE);  Surgeon: Minna Merritts, MD;  Location: [facility] ORS;  Service: Cardiovascular;  Laterality: N/A;   VIDEO ASSISTED THORACOSCOPY (VATS)/THOROCOTOMY Left 06/26/2019   Procedure: VIDEO ASSISTED THORACOSCOPY DECORTICATION;  Surgeon: Lajuana Matte, MD;  Location: [place];  Service: Thoracic;  Laterality: Left;   VIDEO BRONCHOSCOPY N/A 06/26/2019   Procedure: VIDEO BRONCHOSCOPY;  Surgeon: Lajuana Matte, MD;  Location: [facility];  Service: Thoracic;  Laterality: N/A;    Current Medications: Current Meds  Medication Sig   amLODipine (NORVASC) 5 MG tablet Take 1 tablet (5 mg total) by mouth daily.   aspirin 81 MG chewable tablet Chew by mouth daily.   ferrous sulfate 325 (65 FE) MG tablet Take 1 tablet (325 mg total) by mouth 2 (two) times daily with a  meal. (Patient taking differently: Take 325 mg by mouth daily with breakfast. )   furosemide (LASIX) 40 MG tablet Take 1 tablet (40 mg total) by mouth 2 (two) times daily.   insulin NPH-regular Human (70-30) 100 UNIT/ML injection Inject 5 Units into the skin daily.   metoprolol succinate (TOPROL-XL) 100 MG 24 hr tablet Take 100 mg by mouth daily. Take with or immediately following a meal.   Multiple Vitamin (MULTIVITAMIN) tablet Take 1 tablet by mouth daily.    Allergies:   Patient has no known allergies.   Social History   Socioeconomic History   Marital status: Married    Spouse name: Not on file   Number of children: Not on file   Years of education: Not on file   Highest education level: Not on file   Occupational History   Not on file  Tobacco Use   Smoking status: Never Smoker   Smokeless tobacco: Never Used  Substance and Sexual Activity   Alcohol use: Never   Drug use: Never   Sexual activity: Not on file  Other Topics Concern   Not on file  Social History Narrative   Not on file   Social Determinants of Health   Financial Resource Strain:    Difficulty of Paying Living Expenses: Not on file  Food Insecurity:    Worried About [place] in the Last Year: Not on file   Ran Out of Food in the Last Year: Not on file  Transportation Needs:    Lack of Transportation (Medical): Not on file   Lack of Transportation (Non-Medical): Not on file  Physical Activity:    Days of Exercise per Week: Not on file   Minutes of Exercise per Session: Not on file  Stress:    Feeling of Stress : Not on file  Social Connections:    Frequency of Communication with Friends and Family: Not on file   Frequency of Social Gatherings with Friends and Family: Not on file   Attends Religious Services: Not on file   Active Member of Clubs or Organizations: Not on file   Attends Archivist Meetings: Not on file   Marital Status: Not on file     Family History:  The patient's family history includes CAD in his father; Diabetes in his brother, sister, sister, and sister.  ROS:   Review of Systems  Constitutional: Positive for malaise/fatigue. Negative for chills, diaphoresis, fever and weight loss.  HENT: Negative for congestion.   Eyes: Negative for discharge and redness.  Respiratory: Positive for shortness of breath. Negative for cough, hemoptysis, sputum production and wheezing.        Much improved shortness of breath over the past 48 hours with patient currently denying dyspnea  Cardiovascular: Positive for leg swelling. Negative for chest pain, palpitations, orthopnea, claudication and PND.  Gastrointestinal: Negative for abdominal pain, blood in  stool, heartburn, melena, nausea and vomiting.  Genitourinary: Negative for hematuria.  Musculoskeletal: Negative for falls and myalgias.  Skin: Negative for rash.  Neurological: Positive for weakness. Negative for dizziness, tingling, tremors, sensory change, speech change, focal weakness and loss of consciousness.  Endo/Heme/Allergies: Bruises/bleeds easily.  Psychiatric/Behavioral: Negative for substance abuse. The patient is not nervous/anxious.   All other systems reviewed and are negative.    EKGs/Labs/Other Studies Reviewed:    Studies reviewed were summarized above. The additional studies were reviewed today:  2D Echo 05/2019: 1. Left ventricular ejection fraction, by visual estimation, is 45 to  50%. The left ventricle has mildly decreased function. Normal left ventricular size. There is no left ventricular hypertrophy.  2. Global right ventricle has normal systolic function.The right ventricular size is normal. No increase in right ventricular wall thickness.  3. Left atrial size was normal.  4. Right atrial size was normal.  5. The mitral valve is normal in structure. Mild mitral valve regurgitation. No evidence of mitral stenosis.  6. The tricuspid valve is normal in structure. Tricuspid valve regurgitation mild-moderate.  7. The aortic valve is normal in structure. Aortic valve regurgitation was not visualized by color flow Doppler. Structurally normal aortic valve, with no evidence of sclerosis or stenosis.  8. The pulmonic valve was normal in structure. Pulmonic valve regurgitation is not visualized by color flow Doppler.  9. Mildly elevated pulmonary artery systolic pressure. 10. The inferior vena cava is normal in size with greater than 50% respiratory variability, suggesting right atrial pressure of 3 mmHg. _________  TEE 05/2019:   1. Left ventricular ejection fraction, by visual estimation, is 50 to 55%. The left ventricle has normal function. There is mildly increased  left ventricular hypertrophy.  2. Global right ventricle has normal systolic function.The right ventricular size is normal. No increase in right ventricular wall thickness.  3. Left atrial size was normal.  4. No valve vegetation noted.   EKG:  EKG is ordered today.  The EKG ordered today demonstrates NSR, 72 bpm, cannot exclude possible prior septal and lateral infarct, nonspecific ST-T changes  Recent Labs: 06/13/2019: TSH 2.826 06/19/2019: B Natriuretic Peptide 3,281.0 06/25/2019: Magnesium 2.5 06/28/2019: ALT 37 06/30/2019: BUN 57; Creatinine, Ser 1.83; Hemoglobin 8.1; Platelets 188; Potassium 3.8; Sodium 143  Recent Lipid Panel    Component Value Date/Time   CHOL 138 06/26/2019 0409   TRIG 119 06/26/2019 0409   HDL 39 (L) 06/26/2019 0409   CHOLHDL 3.5 06/26/2019 0409   VLDL 24 06/26/2019 0409   LDLCALC 75 06/26/2019 0409    PHYSICAL EXAM:    VS:  BP (!) 144/100 (BP Location: Right Arm, Patient Position: Sitting, Cuff Size: Normal)    Pulse 72    Ht 5\' 9"  (1.753 m)    Wt 186 lb 8 oz (84.6 kg)    SpO2 93%    BMI 27.54 kg/m   BMI: Body mass index is 27.54 kg/m.  Physical Exam  Constitutional: He is oriented to person, place, and time. He appears well-developed and well-nourished.  HENT:  Head: Normocephalic and atraumatic.  Eyes: Right eye exhibits no discharge. Left eye exhibits no discharge.  Neck: JVD present.  Elevated JVD to the angle of the mandible  Cardiovascular: Normal rate, regular rhythm, S1 normal, S2 normal and normal heart sounds. Exam reveals no distant heart sounds, no friction rub, no midsystolic click and no opening snap.  No murmur heard. Pulmonary/Chest: Effort normal. No respiratory distress. He has no decreased breath sounds. He has no wheezes. He has no rhonchi. He has rales in the right lower field and the left lower field. He exhibits no tenderness.  Abdominal: Soft. He exhibits no distension. There is no abdominal tenderness.  Musculoskeletal:         General: Edema present.     Cervical back: Normal range of motion.     Comments: 3+ pitting lower extremity edema that extends superiorly to the abdomen  Lymphadenopathy:  Left upper extremity swelling noted.  Neurological: He is alert and oriented to person, place, and time.  Skin: Skin is warm and dry.  No cyanosis. Nails show no clubbing.  Psychiatric: He has a normal mood and affect. His speech is normal and behavior is normal. Judgment and thought content normal.    Wt Readings from Last 3 Encounters:  08/31/19 186 lb 8 oz (84.6 kg)  06/26/19 170 lb 3.1 oz (77.2 kg)  06/24/19 168 lb 8 oz (76.4 kg)     ASSESSMENT & PLAN:   1. Acute on chronic HFrEF: Patient is massively volume overloaded and is approximately 20 to 30 pounds of volume up. He does note significant improvement in his dyspnea following the addition of metolazone. However, his diuretic response with this addition was likely somewhat muted given that he was not taking this 30 minutes prior to his loop diuretic and was rather taking this 3 hours prior to his loop diuretic. He continues to refuse ED evaluation. Given significant improvement and the patient's dyspnea without any reported orthopnea I have advised the patient we can attempt to undergo outpatient diuresis in the setting of his inpatient diuresis refusal. However, I have advised him this is with significant risk including possible permanent kidney failure, electrolyte abnormalities up to and including death. He understands these risks and accepts them accordingly. I have advised the patient with his significant vascular congestion p.o. medications are not absorbed as efficiently leading to muted diuretic response which is why multiple providers have recommended ED evaluation with IV diuresis. Patient continues to refuse this. In this setting, we will transition him up from furosemide to torsemide 40 mg twice daily for 3 days followed by 40 mg daily thereafter. We may  consider the addition of short-term metolazone when he is seen in follow-up in 72 hours based on updated labs with recommendation patient take this 30 minutes prior to loop diuretic. Patient was advised should his labs indicate worsening renal function or significant electrolyte abnormalities with outpatient diuresis he will need to proceed to the ED for further evaluation. He understands that this treatment course is not optimal. Continue metoprolol succinate. As his volume status improves, and if his renal function and potassium remained stable we should consider escalation of GDMT. However, we will need to be cautious with this given his prior AKI and history of hyperkalemia which has required bicarb, Kayexalate, and Lokelma. It is in this setting, I have deferred initiation of ACE inhibitor/ARB/spironolactone at this time. Following outpatient diuresis he will need ischemic evaluation given his cardiomyopathy. Less likely related to 3rd spacing at this time given recent blood work 48 hours prior showed an improved hemoglobin of 11.3 and albumin of 3.3. Recommend leg elevation. Uncertain if patient would be able to use a scrotal sling at home.  2. PAF: He is maintaining sinus rhythm.  He has not been able to afford Eliquis therefore is not currently anticoagulated.  CHADS2VASc at least 4 (CHF, HTN, DM, vascular disease). Long discussion with patient regarding DOAC versus warfarin. He indicates he will be unable to qualify for medication management as he has previously been referred there earlier this year and could not provide a 2019 tax return. In this setting, we have deferred initiation of DOAC and placed the patient on warfarin 5 mg daily.  He has been referred to the Coumadin clinic with 1st appointment being on 09/03/2019. Given initiation of warfarin, aspirin has been discontinued. Risks and benefits of anticoagulation were discussed. Continue Toprol-XL 100 mg daily for rate control. He is aware of  stroke risk off anticoagulation/subtherapeutic anticoagulation. He was not managed with Lovenox to warfarin bridge  secondary to financial constraints.  3. CKD stage III: Stable on most recent BMP. Check BMP following metolazone usage. Follow-up with PCP/nephrology as directed.  4. Anemia of chronic disease: Stable on most recent CBC.  Follow-up with PCP as directed.  5. HTN: Blood pressure suboptimally controlled at 144/100. Ongoing diuresis as outlined above. Continue Toprol-XL 100 mg daily. Further escalation of antihypertensive therapy will be based upon diuretic response.  6. History of empyema with hydropneumothorax status post VATS procedure: Follow-up with infectious disease and CVTS as directed.  7. Left upper extremity swelling: Doppler negative for DVT in 06/2019, though did show thrombophlebitis. Swelling is unchanged. Continue elevation. Follow-up with PCP.   Disposition: F/u with Dr. Rockey Situ or an APP in 3 days, ED precautions discussed in detail.   Medication Adjustments/Labs and Tests Ordered: Current medicines are reviewed at length with the patient today.  Concerns regarding medicines are outlined above. Medication changes, Labs and Tests ordered today are summarized above and listed in the Patient Instructions accessible in Encounters.   Signed, Christell Faith, PA-C 08/31/2019 2:49 PM     [address] [place], [place] [address] [phone]

## 2019-08-30 NOTE — Telephone Encounter (Signed)
Attempted to call patient. LMTCB 08/30/2019   

## 2019-08-30 NOTE — Telephone Encounter (Signed)
He needs to be seen  Issues include  anemia, protein calorie malnutrition albumin of 2, third spacing Cardiomyopathy Ejection fraction 45 to 50%, global hypokinesis June 13, 2019,   long history of smoking Paroxysmal atrial fibrillation Recent chest tube  Anemia

## 2019-08-30 NOTE — Telephone Encounter (Signed)
Missed call from patient so I returned call and spoke to him.   I attempted to explain POC and reasoning per Christell Faith, PA note. Pt was not happy with that plan and reported that he would not be going to the ED and refused admission. He would like to attempt to manage sx outside of hospital. Pt reports that swelling "has been like this for a long time".   I told patient that I would rather him come and see [place] then not be seen at all, although [facility] will likely recommend ED based on sx.   I made provider aware of conversation face to face.  appt confirmed with patient.

## 2019-08-31 ENCOUNTER — Encounter: Payer: Self-pay | Admitting: Physician Assistant

## 2019-08-31 ENCOUNTER — Ambulatory Visit (INDEPENDENT_AMBULATORY_CARE_PROVIDER_SITE_OTHER): Payer: Self-pay | Admitting: Physician Assistant

## 2019-08-31 ENCOUNTER — Other Ambulatory Visit: Payer: Self-pay

## 2019-08-31 VITALS — BP 144/100 | HR 72 | Ht 69.0 in | Wt 186.5 lb

## 2019-08-31 DIAGNOSIS — N183 Chronic kidney disease, stage 3 unspecified: Secondary | ICD-10-CM

## 2019-08-31 DIAGNOSIS — J948 Other specified pleural conditions: Secondary | ICD-10-CM

## 2019-08-31 DIAGNOSIS — I5023 Acute on chronic systolic (congestive) heart failure: Secondary | ICD-10-CM

## 2019-08-31 DIAGNOSIS — M7989 Other specified soft tissue disorders: Secondary | ICD-10-CM

## 2019-08-31 DIAGNOSIS — J869 Pyothorax without fistula: Secondary | ICD-10-CM

## 2019-08-31 DIAGNOSIS — I482 Chronic atrial fibrillation, unspecified: Secondary | ICD-10-CM

## 2019-08-31 DIAGNOSIS — D638 Anemia in other chronic diseases classified elsewhere: Secondary | ICD-10-CM

## 2019-08-31 DIAGNOSIS — I480 Paroxysmal atrial fibrillation: Secondary | ICD-10-CM

## 2019-08-31 DIAGNOSIS — I10 Essential (primary) hypertension: Secondary | ICD-10-CM

## 2019-08-31 MED ORDER — WARFARIN SODIUM 5 MG PO TABS: 5.0000 mg | ORAL_TABLET | Freq: Every day | ORAL | 0 refills | Status: DC

## 2019-08-31 MED ORDER — TORSEMIDE 20 MG PO TABS: ORAL_TABLET | ORAL | 0 refills | Status: DC

## 2019-08-31 NOTE — Patient Instructions (Signed)
Medication Instructions:   Your physician has recommended you make the following change in your medication:  1. STOP Aspirin 2. STOP Lasix  3. STOP Amlodipine 4. START Torsemide 20MG  - Take 2 tablets (40MG ) by mouth TWICE a day for 3 days, after the 3 days take 2 tablets (40MG ) by mouth daily.  4. START Coumadin- Take 1 tablet (5MG ) by mouth daily.   *If you need a refill on your cardiac medications before your next appointment, please call your pharmacy*  Lab Work:  1. Your physician recommends that you have lab work today(BMET)  If you have labs (blood work) drawn today and your tests are completely normal, you will receive your results only by: Marland Kitchen MyChart Message (if you have MyChart) OR . A paper copy in the mail If you have any lab test that is abnormal or we need to change your treatment, we will call you to review the results.  Testing/Procedures:  1. None Ordered  Follow-Up: At [facility], you and your health needs are our priority.  As part of our continuing mission to provide you with exceptional heart care, we have created designated Provider Care Teams.  These Care Teams include your primary Cardiologist (physician) and Advanced Practice Providers (APPs -  Physician Assistants and Nurse Practitioners) who all work together to provide you with the care you need, when you need it.  Your next appointment:    You will see [facility] at 9:15am Monday December 14th and Christell Faith Monday December 14th at 9:30am.

## 2019-08-31 NOTE — Progress Notes (Signed)
Cardiology Office Note    Date:  09/03/2019   ID:  [name], DOB 08-13-1961, MRN [id number]  PCP:  Derinda Late, MD  Cardiologist:  Ida Rogue, MD  Electrophysiologist:  None   Chief Complaint: Follow-up  History of Present Illness:   Nathan [name] is a 58 y.o. male with history of HFrEF, PAF, left hydropneumothorax status post VATS procedure in 06/2019, IDDM, CKD stage III, MRSA bacteremia, anemia of chronic disease, HTN, tobacco abuse, and medical noncompliance who presents for evaluation of volume overload.  Patient has reported his A. fib dates back to the early 2000's and was initially managed with aspirin though this was subsequently discontinued secondary to headache.  He had been lost to follow-up of until 05/2019.  He was admitted to the hospital in late 05/2019 with right hand cellulitis status post I&D complicated by MRSA bacteremia.  Surface echo showed an EF of 45 to 50%, normal RVSF, mild MR, mild to moderate TR, mild elevated PASP.  TEE showed no evidence of valvular vegetation with an EF of 50 to 55%.  Following this, he returned to the hospital in late 05/2019 with worsening bilateral lower extremity swelling, anasarca, and shortness of breath.  He was unsure of his baseline weight.  BNP was elevated at 3281.  Hemoglobin was down 4 g when compared to earlier in 2020 with a value of 8.  Chest x-ray showed left lower lobe consolidation and pleural effusion.  He was continued on previously recommend antibiotics and placed on IV Lasix with improvement in renal function.  CT chest on 10/1 showed possible left-sided empyema, bilateral lower lobe consolidation and groundglass opacities consistent with infectious process and likely reactive mediastinal and hilar adenopathy, mild pulmonary edema, small pericardial effusion, bilateral pleural effusions with the left being greater than the right, cardiomegaly, and CAD.  He underwent CT-guided chest tube placement.  He was  subsequently transferred to [facility] with concern of left hydropneumothorax.  Repeat chest CT showed a moderate loculated left hydropneumothorax and a small to moderate right pleural effusion.  He subsequently underwent VATS procedure on 06/26/2019.  With diuresis patient developed AKI leading his Lasix to be held.  He was hyperkalemic in the state and required an amp of bicarb, Kayexalate, and Lokelma. He was also noted to have left upper extremity edema during his admission with upper extremity ultrasound being negative for DVT with noted thrombophlebitis. Discharge hospital weight approximately 170.1 pounds.  He did not follow-up after hospital discharge until he was evaluated by his PCPs office on 08/29/2019 in which he had significant complaints of generalized swelling.  He indicated he did not follow-up with anyone post hospital because he was "too sick."  He indicated he was "full of fluid."  He had stopped most of his medications, though reported taking insulin and p.o. Lasix 40 mg twice daily.  BPs were ranging in the 170s over 110s.  He was having difficulty moving/walking/voiding secondary to swelling and dyspnea.  His weight was up 21 pounds from his hospital discharge weight with a documented of 191 pounds, which was up 35 pounds when compared to the last PCP visit in 02/2019.  It was recommended he go to the ED.  However, he refused this.  Stat labs were obtained and outlined as below.  Metolazone 5 mg daily for 3 days was added to his regimen.  Upon cardiology reviewing the chart on 08/30/2019 it was again recommended the patient proceed to the ED and he was  contacted by clinical staff with these recommendations.  He continued to refuse ED evaluation.  He was seen by cardiology on 08/31/2019 and remained massively volume overloaded though did note some improvement in shortness of breath following 3 days of metolazone. He was transitioned from furosemide to torsemide 40 mg twice daily for 3 days  followed by 40 mg daily thereafter. His aspirin was discontinued. He was placed on Coumadin and referred to Coumadin clinic. Follow-up BMP was checked and is outlined below.  Patient comes in doing very well today.  He was unable to pick up torsemide after he was seen on 12/11, so did not start this until 12/12.  With this, he has noted significant improvement in his swelling as well as vigorous urine output.  His weight is down 10 pounds 2 ounces since starting torsemide 40 mg twice daily.  He has stable two-pillow orthopnea.  He notes much less early satiety.  Lower extremity swelling persists though is improving.  He denies any chest pain, shortness of breath, palpitations, dizziness, presyncope, syncope.  He is very pleased with his significant improvement in just 48 hours.  He did start warfarin as directed over the weekend however he received 60-month supply of Eliquis through patient assistance in the mail on 12/12 and did not realize this until this morning when he opened this package.  In this setting, he would prefer to continue with Eliquis and stop Coumadin.  He denies any falls, hematochezia, melena, mopped assist, hematuria, or hematemesis.   Labs: 08/31/2019 - BUN 26, serum creatinine 1.22, potassium 3.6 08/29/2019 - WBC 7.2, Hgb 11.3, PLT 430, glucose 64, sodium 146, potassium 3.7, BUN 26, serum creatinine 1.1, albumin 3.3, AST/ALT normal, BNP 3392 (30281 from 05/2019) 06/2019 - TC 138, TG 119, HDL 39, LDL 75, magnesium 2.5 08/2018 - TSH normal  Past Medical History:  Diagnosis Date  . Anemia of chronic disease   . Chronic combined systolic (congestive) and diastolic (congestive) heart failure ([place])    a. 05/2019 Echo: EF 45-50%, nl LA/RA size. Mild to mod TR.  . CKD (chronic kidney disease), stage III   . Diabetes mellitus without complication ([place])   . Hypertension   . Left Hydropneumothorax/Fibrinopurulent empyema    a. 06/2019 s/p VATS.  Marland Kitchen PAF (paroxysmal atrial fibrillation)  ([place])    a. 05/2019 in setting of sepsis-->s/p TEE/DCCV; b. CHA2DS2VASc = 2-->Eliquis.  . Sepsis ([place])    a. 05/2019 MRSA in setting of infected R thumb.    Past Surgical History:  Procedure Laterality Date  . I&D EXTREMITY Right 06/11/2019   Procedure: IRRIGATION AND DEBRIDEMENT RIGHT THUMB;  Surgeon: Dereck Leep, MD;  Location: [facility] ORS;  Service: Orthopedics;  Laterality: Right;  . TEE WITHOUT CARDIOVERSION N/A 06/14/2019   Procedure: TRANSESOPHAGEAL ECHOCARDIOGRAM (TEE);  Surgeon: Minna Merritts, MD;  Location: [facility] ORS;  Service: Cardiovascular;  Laterality: N/A;  . VIDEO ASSISTED THORACOSCOPY (VATS)/THOROCOTOMY Left 06/26/2019   Procedure: VIDEO ASSISTED THORACOSCOPY DECORTICATION;  Surgeon: Lajuana Matte, MD;  Location: [facility];  Service: Thoracic;  Laterality: Left;  Marland Kitchen VIDEO BRONCHOSCOPY N/A 06/26/2019   Procedure: VIDEO BRONCHOSCOPY;  Surgeon: Lajuana Matte, MD;  Location: [facility] OR;  Service: Thoracic;  Laterality: N/A;    Current Medications: Current Meds  Medication Sig  . ferrous sulfate 325 (65 FE) MG tablet Take 1 tablet (325 mg total) by mouth 2 (two) times daily with a meal. (Patient taking differently: Take 325 mg by mouth daily with breakfast. )  .  insulin NPH-regular Human (70-30) 100 UNIT/ML injection Inject 5 Units into the skin daily.  . metoprolol succinate (TOPROL-XL) 100 MG 24 hr tablet Take 100 mg by mouth daily. Take with or immediately following a meal.  . Multiple Vitamin (MULTIVITAMIN) tablet Take 1 tablet by mouth daily.  Marland Kitchen torsemide (DEMADEX) 20 MG tablet Take 2 tablets (40MG ) by mouth TWICE a day for 3 days then take 2 tablets (40MG ) daily.  Marland Kitchen warfarin (COUMADIN) 5 MG tablet Take 1 tablet (5 mg total) by mouth daily.    Allergies:   Patient has no known allergies.   Social History   Socioeconomic History  . Marital status: Married    Spouse name: Not on file  . Number of children: Not on file  . Years of education: Not on file  .  Highest education level: Not on file  Occupational History  . Not on file  Tobacco Use  . Smoking status: Never Smoker  . Smokeless tobacco: Never Used  Substance and Sexual Activity  . Alcohol use: Never  . Drug use: Never  . Sexual activity: Not on file  Other Topics Concern  . Not on file  Social History Narrative  . Not on file   Social Determinants of Health   Financial Resource Strain:   . Difficulty of Paying Living Expenses: Not on file  Food Insecurity:   . Worried About Charity fundraiser in the Last Year: Not on file  . Ran Out of Food in the Last Year: Not on file  Transportation Needs:   . Lack of Transportation (Medical): Not on file  . Lack of Transportation (Non-Medical): Not on file  Physical Activity:   . Days of Exercise per Week: Not on file  . Minutes of Exercise per Session: Not on file  Stress:   . Feeling of Stress : Not on file  Social Connections:   . Frequency of Communication with Friends and Family: Not on file  . Frequency of Social Gatherings with Friends and Family: Not on file  . Attends Religious Services: Not on file  . Active Member of Clubs or Organizations: Not on file  . Attends Archivist Meetings: Not on file  . Marital Status: Not on file     Family History:  The patient's family history includes CAD in his father; Diabetes in his brother, sister, sister, and sister.  ROS:   Review of Systems  Constitutional: Positive for malaise/fatigue. Negative for chills, diaphoresis, fever and weight loss.  HENT: Negative for congestion.   Eyes: Negative for discharge and redness.  Respiratory: Positive for shortness of breath. Negative for cough, hemoptysis, sputum production and wheezing.   Cardiovascular: Positive for orthopnea and leg swelling. Negative for chest pain, palpitations, claudication and PND.  Gastrointestinal: Negative for abdominal pain, blood in stool, constipation, diarrhea, heartburn, melena, nausea and  vomiting.  Genitourinary: Negative for hematuria.  Musculoskeletal: Negative for falls and myalgias.  Skin: Negative for rash.  Neurological: Positive for weakness. Negative for dizziness, tingling, tremors, sensory change, speech change, focal weakness and loss of consciousness.  Endo/Heme/Allergies: Bruises/bleeds easily.  Psychiatric/Behavioral: Negative for substance abuse. The patient is not nervous/anxious.   All other systems reviewed and are negative.    EKGs/Labs/Other Studies Reviewed:    Studies reviewed were summarized above. The additional studies were reviewed today:  2D Echo 05/2019: 1. Left ventricular ejection fraction, by visual estimation, is 45 to 50%. The left ventricle has mildly decreased function. Normal left ventricular  size. There is no left ventricular hypertrophy. 2. Global right ventricle has normal systolic function.The right ventricular size is normal. No increase in right ventricular wall thickness. 3. Left atrial size was normal. 4. Right atrial size was normal. 5. The mitral valve is normal in structure. Mild mitral valve regurgitation. No evidence of mitral stenosis. 6. The tricuspid valve is normal in structure. Tricuspid valve regurgitation mild-moderate. 7. The aortic valve is normal in structure. Aortic valve regurgitation was not visualized by color flow Doppler. Structurally normal aortic valve, with no evidence of sclerosis or stenosis. 8. The pulmonic valve was normal in structure. Pulmonic valve regurgitation is not visualized by color flow Doppler. 9. Mildly elevated pulmonary artery systolic pressure. 10. The inferior vena cava is normal in size with greater than 50% respiratory variability, suggesting right atrial pressure of 3 mmHg. _________  TEE 05/2019:  1. Left ventricular ejection fraction, by visual estimation, is 50 to 55%. The left ventricle has normal function. There is mildly increased left ventricular hypertrophy. 2.  Global right ventricle has normal systolic function.The right ventricular size is normal. No increase in right ventricular wall thickness. 3. Left atrial size was normal. 4. No valve vegetation noted.   EKG:  EKG is ordered today.  The EKG ordered today demonstrates NSR, 81 bpm, nonspecific ST-T changes  Recent Labs: 06/13/2019: TSH 2.826 06/19/2019: B Natriuretic Peptide 3,281.0 06/25/2019: Magnesium 2.5 06/28/2019: ALT 37 06/30/2019: Hemoglobin 8.1; Platelets 188 08/31/2019: BUN 26; Creatinine, Ser 1.22; Potassium 3.6; Sodium 145  Recent Lipid Panel    Component Value Date/Time   CHOL 138 06/26/2019 0409   TRIG 119 06/26/2019 0409   HDL 39 (L) 06/26/2019 0409   CHOLHDL 3.5 06/26/2019 0409   VLDL 24 06/26/2019 0409   LDLCALC 75 06/26/2019 0409    PHYSICAL EXAM:    VS:  BP (!) 180/90 (BP Location: Left Arm, Patient Position: Sitting, Cuff Size: Normal)   Pulse 81   Ht 5\' 9"  (1.753 m)   Wt 176 lb 4 oz (79.9 kg)   SpO2 98%   BMI 26.03 kg/m   BMI: Body mass index is 26.03 kg/m.  Physical Exam  Constitutional: He is oriented to person, place, and time. He appears well-developed and well-nourished.  HENT:  Head: Normocephalic and atraumatic.  Eyes: Right eye exhibits no discharge. Left eye exhibits no discharge.  Neck: JVD present.  JVD elevated to the angle of the mandible  Cardiovascular: Normal rate, regular rhythm, S1 normal, S2 normal and normal heart sounds. Exam reveals no distant heart sounds, no friction rub, no midsystolic click and no opening snap.  No murmur heard. Pulmonary/Chest: Effort normal. No respiratory distress. He has no decreased breath sounds. He has no wheezes. He has rales in the right lower field and the left lower field. He exhibits no tenderness.  Abdominal: Soft. He exhibits no distension. There is no abdominal tenderness.  Musculoskeletal:        General: Edema present.     Cervical back: Normal range of motion.     Comments: 2-3+ bilateral  lower extremity pitting edema which is significantly improved along the bilateral thighs, scrotum, and flank  Neurological: He is alert and oriented to person, place, and time.  Skin: Skin is warm and dry. No cyanosis. Nails show no clubbing.  Psychiatric: He has a normal mood and affect. His speech is normal and behavior is normal. Judgment and thought content normal.    Wt Readings from Last 3 Encounters:  09/03/19 176 lb  4 oz (79.9 kg)  08/31/19 186 lb 8 oz (84.6 kg)  06/26/19 170 lb 3.1 oz (77.2 kg)     ASSESSMENT & PLAN:   1. Acute on chronic HFrEF: Patient remains significantly volume overloaded though is quite improved with a weight that is down 10 pounds 2 ounces since he was seen on 08/31/2019.  He was unable to start torsemide on 12/11 and started 40 mg twice daily on 12/12 which he will continue through today.  Check stat CBC and CMP today.  If renal function and electrolytes allow we will plan to continue torsemide 40 mg twice daily for the next several days.  Patient will be called with these recommendations once the labs have been resulted.  For now, would continue to try and avoid addition of metolazone.  If labs show significant abnormalities or worsening renal function he will be advised to go to the ED.  He does know and understand his treatment course, undergoing outpatient diuresis with such significant volume overload, is less than optimal and does increase his risk of electrolyte abnormalities, renal dysfunction, decompensation, and potentially death.  As his volume status improves, his BP showed as well.  We will need to consider escalation of GDMT as well.  However, given his prior history of AKI with hyperkalemia which required bicarb, Kayexalate, and will, I have deferred addition of ACE inhibitor/ARB/or spironolactone at this time.  Following outpatient diuresis, he will need an ischemic evaluation given his cardiomyopathy.  His volume overload is less likely related to third  spacing at this time given recent blood work last week which showed an improved hemoglobin of 11.3 and an albumin of 3.3.  Continue to recommend leg elevation.  2. PAF: He is maintaining sinus rhythm.  Initially, he indicated he was unable to afford Eliquis leading to the recommendation for the patient to start warfarin.  However, over the weekend he did receive a 52-month supply of Eliquis though has not yet started this.  We have agreed for the patient to discontinue warfarin and his Coumadin clinic visit this morning.  He will start Eliquis 5 mg twice daily.  CHADS2VASc 4 (CHF, HTN, DM, vascular disease).  Check CMP and CBC as outlined above.  Continue Toprol-XL 100 mg daily for rate control.  3. CKD stage III: Stable on most recent BMP last week.  Check CMP as outlined above.  Should he have worsening of renal function, which may indicate he has failed outpatient diuresis we will plan to proceed to the ED.  4. Anemia of chronic disease: Stable on most recent CBC from last week.  Check CBC.  5. HTN: BP remains elevated.  Continue diuresis as above.  No longer on ACE inhibitor/ARB secondary to AKI with hyperkalemia.  Continue Toprol-XL.  6. History of empyema with hydropneumothorax status post VATS procedure: Follow-up with infectious disease and CVTS as directed.  7. Left upper extremity swelling: Doppler negative for DVT in 06/2019 with noted thrombophlebitis.  Swelling is improving.  Continue elevation.  Follow-up with PCP as directed.  Disposition: F/u with Dr. Rockey Situ or an APP in 1 week.   Medication Adjustments/Labs and Tests Ordered: Current medicines are reviewed at length with the patient today.  Concerns regarding medicines are outlined above. Medication changes, Labs and Tests ordered today are summarized above and listed in the Patient Instructions accessible in Encounters.   Signed, Christell Faith, PA-C 09/03/2019 9:00 AM     [address]  [place], [place] [address] [phone]

## 2019-08-31 NOTE — Telephone Encounter (Signed)
Patient has appointment today with Christell Faith PA-C.

## 2019-09-01 LAB — BASIC METABOLIC PANEL
BUN/Creatinine Ratio: 21 — ABNORMAL HIGH (ref 9–20)
BUN: 26 mg/dL — ABNORMAL HIGH (ref 6–24)
CO2: 30 mmol/L — ABNORMAL HIGH (ref 20–29)
Calcium: 8.5 mg/dL — ABNORMAL LOW (ref 8.7–10.2)
Chloride: 102 mmol/L (ref 96–106)
Creatinine, Ser: 1.22 mg/dL (ref 0.76–1.27)
GFR calc Af Amer: 76 mL/min/{1.73_m2} (ref >59)
GFR calc non Af Amer: 66 mL/min/{1.73_m2} (ref >59)
Glucose: 83 mg/dL (ref 65–99)
Potassium: 3.6 mmol/L (ref 3.5–5.2)
Sodium: 145 mmol/L — ABNORMAL HIGH (ref 134–144)

## 2019-09-02 NOTE — Telephone Encounter (Signed)
Has office visit Monday 12/14

## 2019-09-03 ENCOUNTER — Encounter: Payer: Self-pay | Admitting: Emergency Medicine

## 2019-09-03 ENCOUNTER — Other Ambulatory Visit
Admission: RE | Admit: 2019-09-03 | Discharge: 2019-09-03 | Disposition: A | Discharge status: Home / Self Care | Payer: Self-pay | Attending: Physician Assistant | Admitting: Physician Assistant

## 2019-09-03 ENCOUNTER — Ambulatory Visit (INDEPENDENT_AMBULATORY_CARE_PROVIDER_SITE_OTHER): Payer: Self-pay | Admitting: Physician Assistant

## 2019-09-03 ENCOUNTER — Emergency Department
Admission: EM | Admit: 2019-09-03 | Discharge: 2019-09-03 | Disposition: A | Discharge status: Home / Self Care | Payer: Self-pay | Attending: Emergency Medicine | Admitting: Emergency Medicine

## 2019-09-03 ENCOUNTER — Other Ambulatory Visit: Payer: Self-pay

## 2019-09-03 ENCOUNTER — Encounter: Payer: Self-pay | Admitting: Physician Assistant

## 2019-09-03 ENCOUNTER — Telehealth: Payer: Self-pay

## 2019-09-03 VITALS — BP 180/90 | HR 81 | Ht 69.0 in | Wt 176.2 lb

## 2019-09-03 DIAGNOSIS — I5022 Chronic systolic (congestive) heart failure: Secondary | ICD-10-CM

## 2019-09-03 DIAGNOSIS — I5023 Acute on chronic systolic (congestive) heart failure: Secondary | ICD-10-CM

## 2019-09-03 DIAGNOSIS — I480 Paroxysmal atrial fibrillation: Secondary | ICD-10-CM

## 2019-09-03 DIAGNOSIS — R2243 Localized swelling, mass and lump, lower limb, bilateral: Secondary | ICD-10-CM | POA: Insufficient documentation

## 2019-09-03 DIAGNOSIS — M7989 Other specified soft tissue disorders: Secondary | ICD-10-CM

## 2019-09-03 DIAGNOSIS — I482 Chronic atrial fibrillation, unspecified: Secondary | ICD-10-CM | POA: Insufficient documentation

## 2019-09-03 DIAGNOSIS — I10 Essential (primary) hypertension: Secondary | ICD-10-CM

## 2019-09-03 DIAGNOSIS — Z79899 Other long term (current) drug therapy: Secondary | ICD-10-CM | POA: Insufficient documentation

## 2019-09-03 DIAGNOSIS — E1122 Type 2 diabetes mellitus with diabetic chronic kidney disease: Secondary | ICD-10-CM | POA: Insufficient documentation

## 2019-09-03 DIAGNOSIS — N183 Chronic kidney disease, stage 3 unspecified: Secondary | ICD-10-CM

## 2019-09-03 DIAGNOSIS — Z7901 Long term (current) use of anticoagulants: Secondary | ICD-10-CM | POA: Insufficient documentation

## 2019-09-03 DIAGNOSIS — J869 Pyothorax without fistula: Secondary | ICD-10-CM

## 2019-09-03 DIAGNOSIS — D638 Anemia in other chronic diseases classified elsewhere: Secondary | ICD-10-CM

## 2019-09-03 DIAGNOSIS — I5042 Chronic combined systolic (congestive) and diastolic (congestive) heart failure: Secondary | ICD-10-CM | POA: Insufficient documentation

## 2019-09-03 DIAGNOSIS — I130 Hypertensive heart and chronic kidney disease with heart failure and stage 1 through stage 4 chronic kidney disease, or unspecified chronic kidney disease: Secondary | ICD-10-CM | POA: Insufficient documentation

## 2019-09-03 DIAGNOSIS — E876 Hypokalemia: Secondary | ICD-10-CM | POA: Insufficient documentation

## 2019-09-03 DIAGNOSIS — J948 Other specified pleural conditions: Secondary | ICD-10-CM

## 2019-09-03 LAB — CBC
HCT: 34.4 % — ABNORMAL LOW (ref 39.0–52.0)
Hemoglobin: 10.8 g/dL — ABNORMAL LOW (ref 13.0–17.0)
MCH: 29 pg (ref 26.0–34.0)
MCHC: 31.4 g/dL (ref 30.0–36.0)
MCV: 92.2 fL (ref 80.0–100.0)
Platelets: 340 10*3/uL (ref 150–400)
RBC: 3.73 MIL/uL — ABNORMAL LOW (ref 4.22–5.81)
RDW: 13.9 % (ref 11.5–15.5)
WBC: 6.4 10*3/uL (ref 4.0–10.5)
nRBC: 0 % (ref 0.0–0.2)

## 2019-09-03 LAB — COMPREHENSIVE METABOLIC PANEL
ALT: 38 U/L (ref 0–44)
AST: 37 U/L (ref 15–41)
Albumin: 3.2 g/dL — ABNORMAL LOW (ref 3.5–5.0)
Alkaline Phosphatase: 219 U/L — ABNORMAL HIGH (ref 38–126)
Anion gap: 11 (ref 5–15)
BUN: 34 mg/dL — ABNORMAL HIGH (ref 6–20)
CO2: 35 mmol/L — ABNORMAL HIGH (ref 22–32)
Calcium: 8.5 mg/dL — ABNORMAL LOW (ref 8.9–10.3)
Chloride: 98 mmol/L (ref 98–111)
Creatinine, Ser: 1.36 mg/dL — ABNORMAL HIGH (ref 0.61–1.24)
GFR calc Af Amer: >60 mL/min (ref >60)
GFR calc non Af Amer: 58 mL/min — ABNORMAL LOW (ref >60)
Glucose, Bld: 183 mg/dL — ABNORMAL HIGH (ref 70–99)
Potassium: 2.9 mmol/L — ABNORMAL LOW (ref 3.5–5.1)
Sodium: 144 mmol/L (ref 135–145)
Total Bilirubin: 0.9 mg/dL (ref 0.3–1.2)
Total Protein: 5.8 g/dL — ABNORMAL LOW (ref 6.5–8.1)

## 2019-09-03 LAB — BASIC METABOLIC PANEL
Anion gap: 11 (ref 5–15)
BUN: 34 mg/dL — ABNORMAL HIGH (ref 6–20)
CO2: 36 mmol/L — ABNORMAL HIGH (ref 22–32)
Calcium: 8.7 mg/dL — ABNORMAL LOW (ref 8.9–10.3)
Chloride: 97 mmol/L — ABNORMAL LOW (ref 98–111)
Creatinine, Ser: 1.25 mg/dL — ABNORMAL HIGH (ref 0.61–1.24)
GFR calc Af Amer: >60 mL/min (ref >60)
GFR calc non Af Amer: >60 mL/min (ref >60)
Glucose, Bld: 149 mg/dL — ABNORMAL HIGH (ref 70–99)
Potassium: 3.2 mmol/L — ABNORMAL LOW (ref 3.5–5.1)
Sodium: 144 mmol/L (ref 135–145)

## 2019-09-03 LAB — MAGNESIUM: Magnesium: 1.9 mg/dL (ref 1.7–2.4)

## 2019-09-03 MED ORDER — POTASSIUM CHLORIDE ER 10 MEQ PO TBCR: 10.0000 meq | EXTENDED_RELEASE_TABLET | Freq: Every day | ORAL | 0 refills | Status: DC

## 2019-09-03 MED ORDER — MAGNESIUM OXIDE 400 (241.3 MG) MG PO TABS
400.0000 mg | ORAL_TABLET | Freq: Once | ORAL | Status: AC
  Administered 2019-09-03: 400 mg via ORAL
  Filled 2019-09-03: qty 1

## 2019-09-03 MED ORDER — APIXABAN 5 MG PO TABS: 5.0000 mg | ORAL_TABLET | Freq: Two times a day (BID) | ORAL | Status: DC

## 2019-09-03 MED ORDER — POTASSIUM CHLORIDE 20 MEQ/15ML (10%) PO SOLN
40.0000 meq | Freq: Once | ORAL | Status: AC
  Administered 2019-09-03: 17:00:00 40 meq via ORAL
  Filled 2019-09-03 (×2): qty 30

## 2019-09-03 NOTE — Telephone Encounter (Signed)
Call to patient to review lab results. He verbalized understanding although he expressed that he was not happy with POC.  He said he would be going to [facility].   Provider made aware. No further questions at this time.

## 2019-09-03 NOTE — ED Triage Notes (Signed)
Pt reports his MD told him to come to the ED due to a low potassium level. Pt denies any concerns or symptoms.

## 2019-09-03 NOTE — Discharge Instructions (Signed)
Your lab work today showed a drop in your potassium levels.  Please start taking your fluid pills only once per day instead of twice per day and monitor for any weight gain or difficulty breathing.  You should start taking a potassium supplement, which you were prescribed today.  You can also eat potassium rich foods, including bananas and green vegetables.  Please follow-up closely with the heart failure clinic for recheck of your lab work and return to the ER for worsening symptoms.

## 2019-09-03 NOTE — Telephone Encounter (Signed)
-----   Message from Rise Mu, [place] sent at 09/03/2019 10:32 AM EST ----- Stat labs showed significant hypokalemia with a potassium of 2.9, along with worsening renal function, low protein level though improved from prior.  Blood count is stable when compared to value obtained in Care Everywhere last week.  Recommendations: -I know the patient was improving on follow-up today though he does remain significantly volume overloaded and now has a significantly low potassium and worsening renal function which is likely in the setting of vascular congestion. -With this, again, our safest and best approach would be to proceed to the ED for likely hospital admission with IV diuresis and repletion of potassium.  I recommend he proceed to the ED. -If patient refuses ED/potential hospital admission I worry his renal function will continue to worsen and he will ultimately end up in the ED regardless.

## 2019-09-03 NOTE — ED Notes (Signed)

## 2019-09-03 NOTE — Patient Instructions (Signed)
Medication Instructions:  1- Coumadin was removed from your list since you are taking Eliquis 2- Eliquis was added back to your list. *If you need a refill on your cardiac medications before your next appointment, please call your pharmacy*  Lab Work: Your physician recommends that you return for lab work today at the medical mall. (CMET, CBC)  No appt is needed. Hours are M-F 7AM- 6 PM.  If you have labs (blood work) drawn today and your tests are completely normal, you will receive your results only by: Marland Kitchen MyChart Message (if you have MyChart) OR . A paper copy in the mail If you have any lab test that is abnormal or we need to change your treatment, we will call you to review the results.  Testing/Procedures: None ordered   Follow-Up: At [facility], you and your health needs are our priority.  As part of our continuing mission to provide you with exceptional heart care, we have created designated Provider Care Teams.  These Care Teams include your primary Cardiologist (physician) and Advanced Practice Providers (APPs -  Physician Assistants and Nurse Practitioners) who all work together to provide you with the care you need, when you need it.  Your next appointment:   1 week(s)  The format for your next appointment:   In Person  Provider:    You may see Ida Rogue, MD or Christell Faith, PA-C.

## 2019-09-03 NOTE — Telephone Encounter (Signed)
Attempted to call patient. LMTCB 09/03/2019  Call attempted, no answer. LMTCB. Called 2nd number for wife. No answer, went straight to VM and box was full.

## 2019-09-03 NOTE — Telephone Encounter (Signed)
Patient had appointment 08/31/2019 and scheduled today 09/03/2019 with APP.

## 2019-09-03 NOTE — ED Provider Notes (Signed)
[facility] Emergency Department Provider Note   ____________________________________________   First MD Initiated Contact with Patient 09/03/19 1605     (approximate)  I have reviewed the triage vital signs and the nursing notes.   HISTORY  Chief Complaint Abnormal Lab    HPI Nathan [name] is a 58 y.o. male with past medical history of CHF, A. fib, CKD who presents to the ED for abnormal labs.  Patient reports that the heart failure clinic recently added a new diuretic to his regimen 2 days ago, and since then he has lost about 10 pounds.  He states "this is the best I have been feeling in months" and reports swelling in his legs is much improved.  He denies any chest pain or shortness of breath.  He had follow-up in the heart failure clinic today, and was advised to come to the ED for evaluation when his potassium was noted to be low at 2.9 with an AKI.  He continues to have some swelling in his lower extremities, but states this is much improved compared to what it had been last week.        Past Medical History:  Diagnosis Date  . Anemia of chronic disease   . Chronic combined systolic (congestive) and diastolic (congestive) heart failure ([place])    a. 05/2019 Echo: EF 45-50%, nl LA/RA size. Mild to mod TR.  . CKD (chronic kidney disease), stage III   . Diabetes mellitus without complication ([place])   . Hypertension   . Left Hydropneumothorax/Fibrinopurulent empyema    a. 06/2019 s/p VATS.  Marland Kitchen PAF (paroxysmal atrial fibrillation) ([place])    a. 05/2019 in setting of sepsis-->s/p TEE/DCCV; b. CHA2DS2VASc = 2-->Eliquis.  . Sepsis ([place])    a. 05/2019 MRSA in setting of infected R thumb.    Patient Active Problem List   Diagnosis Date Noted  . Atrial fibrillation ([place])   . Diabetes mellitus without complication ([place])   . AKI (acute kidney injury) ([place])   . Hydropneumothorax 06/24/2019  . Hypertension 06/24/2019  . Insulin dependent diabetes mellitus  type IA ([place]) 06/24/2019  . Anemia of chronic disease 06/24/2019  . Atrial fibrillation, chronic ([place]) 06/24/2019  . Acute on chronic combined systolic and diastolic CHF (congestive heart failure) ([place]) 06/24/2019  . Cellulitis of right hand 06/24/2019  . CKD (chronic kidney disease), stage III   . Empyema ([place])   . Sepsis ([place]) 06/10/2019    Past Surgical History:  Procedure Laterality Date  . I&D EXTREMITY Right 06/11/2019   Procedure: IRRIGATION AND DEBRIDEMENT RIGHT THUMB;  Surgeon: Dereck Leep, MD;  Location: [facility] ORS;  Service: Orthopedics;  Laterality: Right;  . TEE WITHOUT CARDIOVERSION N/A 06/14/2019   Procedure: TRANSESOPHAGEAL ECHOCARDIOGRAM (TEE);  Surgeon: Minna Merritts, MD;  Location: [facility] ORS;  Service: Cardiovascular;  Laterality: N/A;  . VIDEO ASSISTED THORACOSCOPY (VATS)/THOROCOTOMY Left 06/26/2019   Procedure: VIDEO ASSISTED THORACOSCOPY DECORTICATION;  Surgeon: Lajuana Matte, MD;  Location: [place];  Service: Thoracic;  Laterality: Left;  Marland Kitchen VIDEO BRONCHOSCOPY N/A 06/26/2019   Procedure: VIDEO BRONCHOSCOPY;  Surgeon: Lajuana Matte, MD;  Location: [place];  Service: Thoracic;  Laterality: N/A;    Prior to Admission medications   Medication Sig Start Date End Date Taking? Authorizing Provider  apixaban (ELIQUIS) 5 MG TABS tablet Take 1 tablet (5 mg total) by mouth 2 (two) times daily. 09/03/19   Rise Mu, PA-C  ferrous sulfate 325 (65 FE) MG tablet Take 1  tablet (325 mg total) by mouth 2 (two) times daily with a meal. Patient taking differently: Take 325 mg by mouth daily with breakfast.  06/30/19   Regalado, Belkys A, MD  insulin NPH-regular Human (70-30) 100 UNIT/ML injection Inject 5 Units into the skin daily.    [provider]  metoprolol succinate (TOPROL-XL) 100 MG 24 hr tablet Take 100 mg by mouth daily. Take with or immediately following a meal.    [provider]  Multiple Vitamin (MULTIVITAMIN) tablet Take 1 tablet by mouth  daily.    [provider]  potassium chloride (KLOR-CON) 10 MEQ tablet Take 1 tablet (10 mEq total) by mouth daily for 5 days. 09/03/19 09/08/19  Blake Divine, MD  torsemide (DEMADEX) 20 MG tablet Take 2 tablets (40MG ) by mouth TWICE a day for 3 days then take 2 tablets (40MG ) daily. 08/31/19   Rise Mu, PA-C    Allergies Patient has no known allergies.  Family History  Problem Relation Age of Onset  . CAD Father   . Diabetes Sister   . Diabetes Brother   . Diabetes Sister   . Diabetes Sister     Social History Social History   Tobacco Use  . Smoking status: Never Smoker  . Smokeless tobacco: Never Used  Substance Use Topics  . Alcohol use: Never  . Drug use: Never    Review of Systems  Constitutional: No fever/chills Eyes: No visual changes. ENT: No sore throat. Cardiovascular: Denies chest pain. Respiratory: Denies shortness of breath. Gastrointestinal: No abdominal pain.  No nausea, no vomiting.  No diarrhea.  No constipation. Genitourinary: Negative for dysuria. Musculoskeletal: Negative for back pain.  Positive for leg swelling. Skin: Negative for rash. Neurological: Negative for headaches, focal weakness or numbness.  ____________________________________________   PHYSICAL EXAM:  VITAL SIGNS: ED Triage Vitals  Enc Vitals Group     BP 09/03/19 1416 (!) 171/96     Pulse Rate 09/03/19 1416 73     Resp 09/03/19 1416 16     Temp 09/03/19 1416 97.9 F (36.6 C)     Temp Source 09/03/19 1416 Oral     SpO2 09/03/19 1416 98 %     Weight 09/03/19 1327 170 lb (77.1 kg)     Height 09/03/19 1327 5\' 9"  (1.753 m)     Head Circumference --      Peak Flow --      Pain Score 09/03/19 1327 0     Pain Loc --      Pain Edu? --      Excl. in [place]? --     Constitutional: Alert and oriented. Eyes: Conjunctivae are normal. Head: Atraumatic. Nose: No congestion/rhinnorhea. Mouth/Throat: Mucous membranes are moist. Neck: Normal ROM Cardiovascular:  Normal rate, regular rhythm. Grossly normal heart sounds. Respiratory: Normal respiratory effort.  No retractions. Lungs CTAB. Gastrointestinal: Soft and nontender. No distention. Genitourinary: deferred Musculoskeletal: No lower extremity tenderness, 1+ pitting edema to knees bilaterally. Neurologic:  Normal speech and language. No gross focal neurologic deficits are appreciated. Skin:  Skin is warm, dry and intact. No rash noted. Psychiatric: Mood and affect are normal. Speech and behavior are normal.  ____________________________________________   LABS (all labs ordered are listed, but only abnormal results are displayed)  Labs Reviewed  BASIC METABOLIC PANEL - Abnormal; Notable for the following components:      Result Value   Potassium 3.2 (*)    Chloride 97 (*)    CO2 36 (*)  Glucose, Bld 149 (*)    BUN 34 (*)    Creatinine, Ser 1.25 (*)    Calcium 8.7 (*)    All other components within normal limits  MAGNESIUM    PROCEDURES  Procedure(s) performed (including Critical Care):  Procedures   ____________________________________________   INITIAL IMPRESSION / ASSESSMENT AND PLAN / ED COURSE       58 year old male with history of CHF and CKD presents to the ED after being called by the heart failure clinic that his potassium is low with an AKI.  He appears well upon arrival to the ED, states the swelling in his legs is much improved and he denies any chest pain or respiratory symptoms at this time.  He states "this is the best I have been feeling in months" and is adamantly against admission to the hospital.  We will check his electrolytes and replete as needed, suspect he will require both potassium and magnesium.  Patient was given p.o. doses of both potassium and magnesium, although his labs here in the ED are improved to what they were in the clinic earlier.  His kidney function appears to be at his baseline and hypokalemia is relatively mild.  Advised him that he  may cut his torsemide dosing in half and only take it once per day rather than twice per day.  We will also start patient on a potassium supplement to take for the next 5 days.  He remains adamantly against admission to the hospital.  Counseled patient to follow-up closely with the heart failure clinic and to return to the ED for worsening symptoms, patient agrees with plan.      ____________________________________________   FINAL CLINICAL IMPRESSION(S) / ED DIAGNOSES  Final diagnoses:  Hypokalemia  Chronic systolic congestive heart failure [facility])     ED Discharge Orders         Ordered    potassium chloride (KLOR-CON) 10 MEQ tablet  Daily     09/03/19 1749           Note:  This document was prepared using Dragon voice recognition software and may include unintentional dictation errors.   Blake Divine, MD 09/03/19 [phone]

## 2019-09-03 NOTE — ED Notes (Signed)
See triage note  Presents with low k*  Denies another sx's

## 2019-09-04 ENCOUNTER — Telehealth: Payer: Self-pay | Admitting: Family

## 2019-09-04 NOTE — Telephone Encounter (Signed)
Received a referral from the ED provider about scheduling an appointment for this patient. Spoke with patient and explained our role in the HF Clinic and how we fit in with his care team.   Patient says that he has an appointment with [facility] next week and would like to talk with them prior to making an appointment at the HF Clinic. Encouraged patient to call heartcare and ask them versus waiting until next week.   He says that he will call us back if he decides to schedule an appointment. ED provider was made aware.

## 2019-09-05 ENCOUNTER — Telehealth: Payer: Self-pay | Admitting: Cardiovascular Disease

## 2019-09-05 NOTE — Telephone Encounter (Signed)
Patient was seen in our office and ED Monday 12/14 Patient would like to clarify medications and make sure it will be ok to keep appointment next Tuesday 12/22 with C [facility] or if he needs to come in sooner Please call to discuss

## 2019-09-05 NOTE — Telephone Encounter (Signed)
Spoke with patient and reviewed medications and instructions with him in detail. Also confirmed his upcoming appointment on Tuesday and he verbalized understanding with no further questions at this time.

## 2019-09-11 ENCOUNTER — Other Ambulatory Visit
Admission: RE | Admit: 2019-09-11 | Discharge: 2019-09-11 | Disposition: A | Discharge status: Home / Self Care | Payer: Self-pay | Source: Ambulatory Visit | Attending: Nurse Practitioner | Admitting: Nurse Practitioner

## 2019-09-11 ENCOUNTER — Encounter: Payer: Self-pay | Admitting: Nurse Practitioner

## 2019-09-11 ENCOUNTER — Other Ambulatory Visit: Payer: Self-pay

## 2019-09-11 ENCOUNTER — Ambulatory Visit (INDEPENDENT_AMBULATORY_CARE_PROVIDER_SITE_OTHER): Payer: Self-pay | Admitting: Nurse Practitioner

## 2019-09-11 ENCOUNTER — Telehealth: Payer: Self-pay

## 2019-09-11 VITALS — BP 160/94 | HR 80 | Ht 69.0 in | Wt 174.5 lb

## 2019-09-11 DIAGNOSIS — N183 Chronic kidney disease, stage 3 unspecified: Secondary | ICD-10-CM

## 2019-09-11 DIAGNOSIS — I5043 Acute on chronic combined systolic (congestive) and diastolic (congestive) heart failure: Secondary | ICD-10-CM | POA: Insufficient documentation

## 2019-09-11 DIAGNOSIS — I10 Essential (primary) hypertension: Secondary | ICD-10-CM

## 2019-09-11 DIAGNOSIS — I429 Cardiomyopathy, unspecified: Secondary | ICD-10-CM

## 2019-09-11 LAB — BASIC METABOLIC PANEL
Anion gap: 7 (ref 5–15)
BUN: 43 mg/dL — ABNORMAL HIGH (ref 6–20)
CO2: 34 mmol/L — ABNORMAL HIGH (ref 22–32)
Calcium: 8.3 mg/dL — ABNORMAL LOW (ref 8.9–10.3)
Chloride: 102 mmol/L (ref 98–111)
Creatinine, Ser: 1.43 mg/dL — ABNORMAL HIGH (ref 0.61–1.24)
GFR calc Af Amer: >60 mL/min (ref >60)
GFR calc non Af Amer: 54 mL/min — ABNORMAL LOW (ref >60)
Glucose, Bld: 282 mg/dL — ABNORMAL HIGH (ref 70–99)
Potassium: 3.5 mmol/L (ref 3.5–5.1)
Sodium: 143 mmol/L (ref 135–145)

## 2019-09-11 MED ORDER — POTASSIUM CHLORIDE ER 20 MEQ PO TBCR: 40.0000 meq | EXTENDED_RELEASE_TABLET | Freq: Every day | ORAL | 3 refills | Status: DC

## 2019-09-11 MED ORDER — TORSEMIDE 20 MG PO TABS: ORAL_TABLET | ORAL | 0 refills | Status: DC

## 2019-09-11 MED ORDER — METOLAZONE 2.5 MG PO TABS: ORAL_TABLET | ORAL | 3 refills | Status: DC

## 2019-09-11 MED ORDER — CARVEDILOL 12.5 MG PO TABS: 12.5000 mg | ORAL_TABLET | Freq: Two times a day (BID) | ORAL | 3 refills | Status: DC

## 2019-09-11 NOTE — Patient Instructions (Signed)
Medication Instructions:  1- STOP Metoprolol 2- DECREASE Torsemide Take 2 tablets (40MG ) by mouth once daily. 3- START Coreg Take 1 tablet (12.5 mg total) by mouth 2 (two) times daily 4- START Metolazone today take 1 tablet (2.5 mg total), then take 1 tablet (2.5 mg total) once daily on Monday, Wednesday, fridays. *If you need a refill on your cardiac medications before your next appointment, please call your pharmacy*  Lab Work: Your physician recommends that you return for lab work today at the medical mall. ([facility]) No appt is needed. Hours are M-F 7AM- 6 PM.  If you have labs (blood work) drawn today and your tests are completely normal, you will receive your results only by: Marland Kitchen MyChart Message (if you have MyChart) OR . A paper copy in the mail If you have any lab test that is abnormal or we need to change your treatment, we will call you to review the results.  Testing/Procedures: None ordered   Follow-Up: At [facility], you and your health needs are our priority.  As part of our continuing mission to provide you with exceptional heart care, we have created designated Provider Care Teams.  These Care Teams include your primary Cardiologist (physician) and Advanced Practice Providers (APPs -  Physician Assistants and Nurse Practitioners) who all work together to provide you with the care you need, when you need it.  Your next appointment:   1 week(s)  The format for your next appointment:   In Person  Provider:    PLease see Murray Hodgkins, NP on dec 30th.

## 2019-09-11 NOTE — Telephone Encounter (Signed)
-----   Message from Theora Gianotti, NP sent at 09/11/2019 11:44 AM EST ----- Creatinine mildly elevated but likely within prior baseline.  Potassium is low.  Please add Kdur 100meq daily and stick w/ plan for metolazone as discussed today.  Follow up next week as planned and we can check labs again then.

## 2019-09-11 NOTE — Telephone Encounter (Signed)
Call to patient to discuss lab results.   No further questions at this time. rx for Kdur sent.   Advised pt to call for any further questions or concerns.

## 2019-09-11 NOTE — Progress Notes (Signed)
Office Visit    Patient Name: Nathan [name] Date of Encounter: 09/11/2019  Primary Care Provider:  Derinda Late, MD Primary Cardiologist:  Ida Rogue, MD  Chief Complaint    58 year old male with a history of HFmrEF/chronic combined systolic/diastolic CHF, cardiomyopathy (ischemic versus nonischemic), paroxysmal atrial fibrillation, left hydropneumothorax status post VATS (October 2020), insulin-dependent diabetes mellitus, stage III chronic kidney disease, MRSA bacteremia, anemia of chronic disease, hypertension, tobacco abuse, and recent hypokalemia, who presents for follow-up of heart failure.  Past Medical History    Past Medical History:  Diagnosis Date  . Anemia of chronic disease   . Chronic combined systolic (congestive) and diastolic (congestive) heart failure ([place])    a. 05/2019 Echo: EF 45-50%, nl LA/RA size. Mild to mod TR.  . CKD (chronic kidney disease), stage III   . Diabetes mellitus without complication ([place])   . Hypertension   . Left Hydropneumothorax/Fibrinopurulent empyema    a. 06/2019 s/p VATS.  Marland Kitchen PAF (paroxysmal atrial fibrillation) ([place])    a. 05/2019 in setting of sepsis-->s/p TEE/DCCV; b. CHA2DS2VASc = 2-->Eliquis.  . Sepsis ([place])    a. 05/2019 MRSA in setting of infected R thumb.   Past Surgical History:  Procedure Laterality Date  . HAND SURGERY Right   . I & D EXTREMITY Right 06/11/2019   Procedure: IRRIGATION AND DEBRIDEMENT RIGHT THUMB;  Surgeon: Dereck Leep, MD;  Location: [facility] ORS;  Service: Orthopedics;  Laterality: Right;  . TEE WITHOUT CARDIOVERSION N/A 06/14/2019   Procedure: TRANSESOPHAGEAL ECHOCARDIOGRAM (TEE);  Surgeon: Minna Merritts, MD;  Location: [facility] ORS;  Service: Cardiovascular;  Laterality: N/A;  . VIDEO ASSISTED THORACOSCOPY (VATS)/THOROCOTOMY Left 06/26/2019   Procedure: VIDEO ASSISTED THORACOSCOPY DECORTICATION;  Surgeon: Lajuana Matte, MD;  Location: [place];  Service: Thoracic;  Laterality: Left;  Marland Kitchen  VIDEO BRONCHOSCOPY N/A 06/26/2019   Procedure: VIDEO BRONCHOSCOPY;  Surgeon: Lajuana Matte, MD;  Location: [facility];  Service: Thoracic;  Laterality: N/A;    Allergies  No Known Allergies  History of Present Illness    58 year old male with a history of HFmrEF/chronic combined systolic diastolic congestive heart failure, paroxysmal atrial fibrillation (CHA2DS2VASc equals 4), diabetes, stage III chronic kidney disease, anemia of chronic disease, hypertension, tobacco abuse, noncompliance, MRSA bacteremia, and left hydropneumothorax status post VATS (October 2020).  A. fib history dates back to the early 2000's, and he was initially managed with aspirin though this was subsequently discontinued secondary to headaches.  He was lost to follow-up until September 2020, when he was admitted with right hand cellulitis requiring I&D.  This was complicated by MRSA bacteremia.  Transthoracic echo showed an EF of 45 to 50% with mild MR mild to moderate TR.  This was followed by TEE which did not show any evidence of valvular vegetation.  EF by TEE was 50 to 55%.  He was subsequently discharged but was readmitted in late September with worsening bilateral lower extremity swelling, anasarca, and dyspnea.  BNP was elevated at 3281.  Creatinine was above baseline.  Chest x-ray showed left lower lobe consolidation and pleural effusion.  He was placed on intravenous Lasix with good response and improved renal function.  CT of the chest on October 1 showed possible left-sided empyema with bilateral lower lobe consolidation and groundglass opacities consistent with infectious process and likely reactive mediastinal and hilar adenopathy.  CT-guided chest tube placement was performed.  He was subsequently transferred to [facility] out of concern for left hydropneumothorax.  Repeat chest CT  showed a moderate loculated left hydropneumothorax and a small to moderate right pleural effusion.  VATS was performed on June 26, 2019.   During hospitalization, he developed acute kidney injury in the setting of ongoing diuresis as well as hyperkalemia requiring treatment.  He was discharged on October 10 at a weight of 170.1 pounds.  He did not follow-up with cardiology following discharge and had come off most of his medications.  He was referred back to [place] by primary care and was seen December 11 secondary to massive volume overload.  He was switched to torsemide with subsequent 10 pound weight loss noted at follow-up on December 14.  He felt significantly better at that visit but was noted to have mild worsening of renal function (creatinine 1.36) and also hypokalemia 2.9.  He was referred to the ED where potassium was supplemented and his torsemide dose was cut in half.  Since his ER visit on 12/14 he has been stable, but has not noted any additional wt loss or relief from bilat lower ext edema.  His weight was 170 pounds on his home scale this morning which, he believes about 10 pounds above a dry weight.  From a functional standpoint, he has been stable.  He sleeps on one pillow at night and denies dyspnea on exertion, PND, or early satiety.  Unfortunately, he continues to have significant lower abdominal and leg edema on the lower dose of torsemide (40 mg once a day).  Home Medications    Prior to Admission medications   Medication Sig Start Date End Date Taking? Authorizing Provider  apixaban (ELIQUIS) 5 MG TABS tablet Take 1 tablet (5 mg total) by mouth 2 (two) times daily. 09/03/19   Rise Mu, PA-C  ferrous sulfate 325 (65 FE) MG tablet Take 1 tablet (325 mg total) by mouth 2 (two) times daily with a meal. Patient taking differently: Take 325 mg by mouth daily with breakfast.  06/30/19   Regalado, Belkys A, MD  insulin NPH-regular Human (70-30) 100 UNIT/ML injection Inject 5 Units into the skin daily.    [provider]  metoprolol succinate (TOPROL-XL) 100 MG 24 hr tablet Take 100 mg by mouth daily. Take with or  immediately following a meal.    [provider]  Multiple Vitamin (MULTIVITAMIN) tablet Take 1 tablet by mouth daily.    [provider]  potassium chloride (KLOR-CON) 10 MEQ tablet Take 1 tablet (10 mEq total) by mouth daily for 5 days. 09/03/19 09/08/19  Blake Divine, MD  torsemide (DEMADEX) 20 MG tablet Take 2 tablets (40MG ) by mouth TWICE a day for 3 days then take 2 tablets (40MG ) daily. 08/31/19   Rise Mu, PA-C    Review of Systems    Ongoing, significant bilateral lower extremity and lower abdominal edema.  He denies chest pain, palpitations, PND, orthopnea, dizziness, syncope, or early satiety.  All other systems reviewed and are otherwise negative except as noted above.  Physical Exam    VS:  BP (!) 160/94 (BP Location: Left Arm, Patient Position: Sitting, Cuff Size: Normal)   Pulse 80   Ht 5\' 9"  (1.753 m)   Wt 174 lb 8 oz (79.2 kg)   SpO2 95%   BMI 25.77 kg/m  , BMI Body mass index is 25.77 kg/m. GEN: Well nourished, well developed, in no acute distress. HEENT: normal. Neck: Supple, JVD to jaw.  No carotid bruits, or masses. Cardiac: RRR, no murmurs, rubs, or gallops. No clubbing, cyanosis.  Significant  lower abdominal and flank edema along with 3+ bilateral lower extremity edema to the hips.  Radials/PT 2+ and equal bilaterally.  Respiratory:  Respirations regular and unlabored, bibasilar crackles.  Otherwise clear to auscultation. GI: The lower abdomen is firm and edematous.  Nontender. BS + x 4. MS: no deformity or atrophy. Skin: warm and dry, no rash. Neuro:  Strength and sensation are intact. Psych: Normal affect.  Accessory Clinical Findings    ECG personally reviewed by me today -regular sinus rhythm, 80, right axis deviation, nonspecific T changes - no acute changes.  Lab Results  Component Value Date   WBC 6.4 09/03/2019   HGB 10.8 (L) 09/03/2019   HCT 34.4 (L) 09/03/2019   MCV 92.2 09/03/2019   PLT 340 09/03/2019   Lab Results   Component Value Date   CREATININE 1.25 (H) 09/03/2019   BUN 34 (H) 09/03/2019   NA 144 09/03/2019   K 3.2 (L) 09/03/2019   CL 97 (L) 09/03/2019   CO2 36 (H) 09/03/2019   Lab Results  Component Value Date   ALT 38 09/03/2019   AST 37 09/03/2019   ALKPHOS 219 (H) 09/03/2019   BILITOT 0.9 09/03/2019   Lab Results  Component Value Date   CHOL 138 06/26/2019   HDL 39 (L) 06/26/2019   LDLCALC 75 06/26/2019   TRIG 119 06/26/2019   CHOLHDL 3.5 06/26/2019    Lab Results  Component Value Date   HGBA1C 7.2 (H) 06/13/2019    Assessment & Plan    1.  Acute on chronic combined systolic and diastolic congestive heart failure/HFmrEF/Cardiomyopathy: Patient was hospitalized in September with MRSA bacteremia and found to have an EF of 45 to 50% by transthoracic echo and 50 to 55% by TEE.  He has been dealing with significant lower extremity edema and heart failure since.  He responded well to torsemide 40 mg twice daily however this resulted in slight rise in creatinine and significant hypokalemia, requiring ED evaluation on December 14.  Potassium was supplemented in the ER and he took some potassium supplement for 5 days after the visit.  No supplementation over the past 2 days.  Further, torsemide dose was reduced to 40 mg daily by the ER staff.  Since his ER visit, he has not noticed any improvement in lower extremity swelling.  He is stable from a respiratory standpoint but remains significantly volume overloaded.  I will check a basic metabolic panel today.  As a higher dose of torsemide because slight rise in creatinine, I will stay w/ 40 daily and I am going to add metolazone 2.5 mg on Mondays, Wednesdays, and Fridays.  I did ask him to take a dose today along with an additional dose of torsemide.  Based on basic metabolic panel today, I will be adding potassium supplementation.  As his blood pressure remains elevated, I am switching him from metoprolol to carvedilol.  Provided stability of  renal function with diuresis, I will look to add an ARB at his next visit.  We did discuss today that ultimately, he will require an ischemic evaluation and potentially a right heart catheterization if volume overload persists despite efforts at outpatient diuresis.  He eats a lot of convenience meals.  We discussed the importance of daily weights, sodium restriction, medication compliance, and symptom reporting and he verbalizes understanding.    2.  Paroxysmal atrial fibrillation: Maintaining sinus rhythm.  He is now on Eliquis in the setting of a CHA2DS2VASc of 4.  Switching beta-blocker  to carvedilol for better blood pressure management.  3.  Essential hypertension: Blood pressure elevated.  Switching metoprolol to carvedilol as outlined above.  Hope to add ARB next week.  4.  Stage III chronic kidney disease: Follow-up basic metabolic panel today.  We will look to add ARB in the near future for hypertension and renal protection.  5.  Anemia of chronic disease: This was stable at last check.  Plan to follow-up CBC in roughly a month given relative new initiation of Eliquis.  6.  History of empyema with hydropneumothorax status post VATS: Previously completed antibiotics.  7.  Disposition: Follow-up basic metabolic panel today.  I will see him back in 1 week.  Murray Hodgkins, NP 09/11/2019, 8:32 AM

## 2019-09-11 NOTE — Progress Notes (Deleted)
Cardiology Office Note  Date: 09/11/2019   ID: Nathan [name], DOB 19-Apr-1961, MRN [id number]  PCP:  Derinda Late, MD  Cardiologist:  Ida Rogue, MD Electrophysiologist:  None   Chief Complaint  Patient presents with  . Follow-up    History of Present Illness: Nathan [name] is a 58 y.o. male  Past Medical History:  Diagnosis Date  . Anemia of chronic disease   . Chronic combined systolic (congestive) and diastolic (congestive) heart failure ([place])    a. 05/2019 Echo: EF 45-50%, nl LA/RA size. Mild to mod TR.  . CKD (chronic kidney disease), stage III   . Diabetes mellitus without complication ([place])   . Hypertension   . Left Hydropneumothorax/Fibrinopurulent empyema    a. 06/2019 s/p VATS.  Marland Kitchen PAF (paroxysmal atrial fibrillation) ([place])    a. 05/2019 in setting of sepsis-->s/p TEE/DCCV; b. CHA2DS2VASc = 2-->Eliquis.  . Sepsis ([place])    a. 05/2019 MRSA in setting of infected R thumb.    Past Surgical History:  Procedure Laterality Date  . I & D EXTREMITY Right 06/11/2019   Procedure: IRRIGATION AND DEBRIDEMENT RIGHT THUMB;  Surgeon: Dereck Leep, MD;  Location: [facility];  Service: Orthopedics;  Laterality: Right;  . TEE WITHOUT CARDIOVERSION N/A 06/14/2019   Procedure: TRANSESOPHAGEAL ECHOCARDIOGRAM (TEE);  Surgeon: Minna Merritts, MD;  Location: [facility];  Service: Cardiovascular;  Laterality: N/A;  . VIDEO ASSISTED THORACOSCOPY (VATS)/THOROCOTOMY Left 06/26/2019   Procedure: VIDEO ASSISTED THORACOSCOPY DECORTICATION;  Surgeon: Lajuana Matte, MD;  Location: [facility];  Service: Thoracic;  Laterality: Left;  Marland Kitchen VIDEO BRONCHOSCOPY N/A 06/26/2019   Procedure: VIDEO BRONCHOSCOPY;  Surgeon: Lajuana Matte, MD;  [facility];  Service: Thoracic;  Laterality: N/A;    Current Outpatient Medications  Medication Sig Dispense Refill  . apixaban (ELIQUIS) 5 MG TABS tablet Take 1 tablet (5 mg total) by mouth 2 (two) times daily. 60 tablet   . ferrous  sulfate 325 (65 FE) MG tablet Take 1 tablet (325 mg total) by mouth 2 (two) times daily with a meal. (Patient taking differently: Take 325 mg by mouth daily with breakfast. ) 60 tablet 1  . insulin NPH-regular Human (70-30) 100 UNIT/ML injection Inject 5 Units into the skin daily.    . metoprolol succinate (TOPROL-XL) 100 MG 24 hr tablet Take 100 mg by mouth daily. Take with or immediately following a meal.    . Multiple Vitamin (MULTIVITAMIN) tablet Take 1 tablet by mouth daily.    . potassium chloride (KLOR-CON) 10 MEQ tablet Take 1 tablet (10 mEq total) by mouth daily for 5 days. 5 tablet 0  . torsemide (DEMADEX) 20 MG tablet Take 2 tablets (40MG ) by mouth TWICE a day for 3 days then take 2 tablets (40MG ) daily. 120 tablet 0   No current facility-administered medications for this visit.   Allergies:  Patient has no known allergies.   Social History: The patient  reports that he has never smoked. He has never used smokeless tobacco. He reports that he does not drink alcohol or use drugs.   Family History: The patient's family history includes CAD in his father; Diabetes in his brother, sister, sister, and sister.   ROS:  Please see the history of present illness. Otherwise, complete review of systems is positive for {NONE DEFAULTED:18576::"none"}.  All other systems are reviewed and negative.   Physical Exam: VS:  There were no vitals taken for this visit., BMI There is no height or weight  on file to calculate BMI.  Wt Readings from Last 3 Encounters:  09/03/19 170 lb (77.1 kg)  09/03/19 176 lb 4 oz (79.9 kg)  08/31/19 186 lb 8 oz (84.6 kg)    General: Patient appears comfortable at rest. HEENT: Conjunctiva and lids normal, oropharynx clear with moist mucosa. Neck: Supple, no elevated JVP or carotid bruits, no thyromegaly. Lungs: Clear to auscultation, nonlabored breathing at rest. Cardiac: Regular rate and rhythm, no S3 or significant systolic murmur, no pericardial rub. Abdomen:  Soft, nontender, no hepatomegaly, bowel sounds present, no guarding or rebound. Extremities: No pitting edema, distal pulses 2+. Skin: Warm and dry. Musculoskeletal: No kyphosis. Neuropsychiatric: Alert and oriented x3, affect grossly appropriate.  ECG:  {EKG/Telemetry Strips Reviewed:[id number]}  Recent Labwork: 06/13/2019: TSH 2.826 06/19/2019: B Natriuretic Peptide 3,281.0 09/03/2019: ALT 38; AST 37; BUN 34; Creatinine, Ser 1.25; Hemoglobin 10.8; Magnesium 1.9; Platelets 340; Potassium 3.2; Sodium 144     Component Value Date/Time   CHOL 138 06/26/2019 0409   TRIG 119 06/26/2019 0409   HDL 39 (L) 06/26/2019 0409   CHOLHDL 3.5 06/26/2019 0409   VLDL 24 06/26/2019 0409   LDLCALC 75 06/26/2019 0409    Other Studies Reviewed Today:   Assessment and Plan:  No diagnosis found.   Medication Adjustments/Labs and Tests Ordered: Current medicines are reviewed at length with the patient today.  Concerns regarding medicines are outlined above.    There are no Patient Instructions on file for this visit.       Signed, Levell July, NP 09/11/2019 8:21 AM    [facility] [place], [place], [place] [address] Phone: [phone]; Fax: [phone]

## 2019-09-19 ENCOUNTER — Ambulatory Visit (INDEPENDENT_AMBULATORY_CARE_PROVIDER_SITE_OTHER): Payer: Self-pay | Admitting: Family Medicine

## 2019-09-19 ENCOUNTER — Encounter: Payer: Self-pay | Admitting: Family Medicine

## 2019-09-19 ENCOUNTER — Other Ambulatory Visit: Payer: Self-pay

## 2019-09-19 ENCOUNTER — Other Ambulatory Visit
Admission: RE | Admit: 2019-09-19 | Discharge: 2019-09-19 | Disposition: A | Discharge status: Home / Self Care | Payer: Self-pay | Source: Ambulatory Visit | Attending: Family Medicine | Admitting: Family Medicine

## 2019-09-19 VITALS — BP 148/80 | HR 68 | Ht 69.0 in | Wt 161.5 lb

## 2019-09-19 DIAGNOSIS — I10 Essential (primary) hypertension: Secondary | ICD-10-CM

## 2019-09-19 DIAGNOSIS — I5043 Acute on chronic combined systolic (congestive) and diastolic (congestive) heart failure: Secondary | ICD-10-CM

## 2019-09-19 DIAGNOSIS — D638 Anemia in other chronic diseases classified elsewhere: Secondary | ICD-10-CM

## 2019-09-19 DIAGNOSIS — I480 Paroxysmal atrial fibrillation: Secondary | ICD-10-CM

## 2019-09-19 DIAGNOSIS — E876 Hypokalemia: Secondary | ICD-10-CM

## 2019-09-19 DIAGNOSIS — N183 Chronic kidney disease, stage 3 unspecified: Secondary | ICD-10-CM

## 2019-09-19 LAB — BASIC METABOLIC PANEL
Anion gap: 9 (ref 5–15)
BUN: 56 mg/dL — ABNORMAL HIGH (ref 6–20)
CO2: 34 mmol/L — ABNORMAL HIGH (ref 22–32)
Calcium: 8.4 mg/dL — ABNORMAL LOW (ref 8.9–10.3)
Chloride: 100 mmol/L (ref 98–111)
Creatinine, Ser: 1.62 mg/dL — ABNORMAL HIGH (ref 0.61–1.24)
GFR calc Af Amer: 54 mL/min — ABNORMAL LOW (ref >60)
GFR calc non Af Amer: 47 mL/min — ABNORMAL LOW (ref >60)
Glucose, Bld: 130 mg/dL — ABNORMAL HIGH (ref 70–99)
Potassium: 4.9 mmol/L (ref 3.5–5.1)
Sodium: 143 mmol/L (ref 135–145)

## 2019-09-19 MED ORDER — CARVEDILOL 25 MG PO TABS: 25.0000 mg | ORAL_TABLET | Freq: Two times a day (BID) | ORAL | 3 refills | Status: DC

## 2019-09-19 NOTE — Progress Notes (Signed)
Cardiology Office Note  Date: 09/19/2019   ID: Nathan [name], DOB 1961-04-01, MRN [id number]  PCP:  Derinda Late, MD  Cardiologist:  Ida Rogue, MD Electrophysiologist:  None   Chief Complaint  Patient presents with  . Follow-up    1 week follow up. Meds reviewed by the pt. verbally. Pt. c/o LE edema.     History of Present Illness: Nathan [name] is a 58 y.o. male last encounter with Nathan [name] September 11, 2019 in follow-up for heart failure.  History of chronic combined systolic/diastolic heart failure, cardiomyopathy (ischemic versus nonischemic), paroxysmal atrial fib., left hydropneumothorax status post VATS October 2020, insulin-dependent diabetes chronic kidney disease stage III, MRSA bacteremia, anemia of chronic disease, hypertension, smoking, hypokalemia.  He was admitted in September for worsening bilateral lower extremity edema, anasarca , and dyspnea. CT showed LL lobe consolidation and pleural effusion. He received IV lasix with good response. There was concern for left hydro-pnuemothorax and he has transferred to [facility]. VATS performed on October 6 , 2020. He developed AKI secondary to diuresis. He was also hypokalemic. D/C weight of 170 lbs.  He did not follow up at d/c and stopped his medications. He was referred back by PCP and had significant volume overload. Started on Torsemide with 10 lb wt loss on f/u up on December 14. However he was hypokalemic and sent to ED for K of 2.9. Torsemide dose was halved at that time. He continued to have significant LE edema and lower abdominal edema on torsemide 40 mg daily.  States he is feeling much better now and has lost 13 lbs since last visit since Metolazone was added. States his leg edema has significantly decreased. No other complaints.   EKG reviewed with patient today.   Cardiac medication reviewed with patient today.  Past Medical History:  Diagnosis Date  . Anemia of chronic disease   .  Chronic combined systolic (congestive) and diastolic (congestive) heart failure ([place])    a. 05/2019 Echo: EF 45-50%, nl LA/RA size. Mild to mod TR.  . CKD (chronic kidney disease), stage III   . Diabetes mellitus without complication ([place])   . Hypertension   . Left Hydropneumothorax/Fibrinopurulent empyema    a. 06/2019 s/p VATS.  Marland Kitchen PAF (paroxysmal atrial fibrillation) ([place])    a. 05/2019 in setting of sepsis-->s/p TEE/DCCV; b. CHA2DS2VASc = 2-->Eliquis.  . Sepsis ([place])    a. 05/2019 MRSA in setting of infected R thumb.    Past Surgical History:  Procedure Laterality Date  . HAND SURGERY Right   . I & D EXTREMITY Right 06/11/2019   Procedure: IRRIGATION AND DEBRIDEMENT RIGHT THUMB;  Surgeon: Dereck Leep, MD;  Location: [facility];  Service: Orthopedics;  Laterality: Right;  . TEE WITHOUT CARDIOVERSION N/A 06/14/2019   Procedure: TRANSESOPHAGEAL ECHOCARDIOGRAM (TEE);  Surgeon: Minna Merritts, MD;  Location: [facility];  Service: Cardiovascular;  Laterality: N/A;  . VIDEO ASSISTED THORACOSCOPY (VATS)/THOROCOTOMY Left 06/26/2019   Procedure: VIDEO ASSISTED THORACOSCOPY DECORTICATION;  Surgeon: Lajuana Matte, MD;  Location: [facility];  Service: Thoracic;  Laterality: Left;  Marland Kitchen VIDEO BRONCHOSCOPY N/A 06/26/2019   Procedure: VIDEO BRONCHOSCOPY;  Surgeon: Lajuana Matte, MD;  [facility];  Service: Thoracic;  Laterality: N/A;    Current Outpatient Medications  Medication Sig Dispense Refill  . apixaban (ELIQUIS) 5 MG TABS tablet Take 1 tablet (5 mg total) by mouth 2 (two) times daily. 60 tablet   . carvedilol (COREG) 12.5 MG tablet Take  1 tablet (12.5 mg total) by mouth 2 (two) times daily. 180 tablet 3  . ferrous sulfate 325 (65 FE) MG tablet Take 1 tablet (325 mg total) by mouth 2 (two) times daily with a meal. (Patient taking differently: Take 325 mg by mouth daily with breakfast. ) 60 tablet 1  . insulin NPH-regular Human (70-30) 100 UNIT/ML injection Inject 5 Units into the skin  daily.    . metolazone (ZAROXOLYN) 2.5 MG tablet Take 1 tablet (2.5 mg total) once daily on Monday, Wednesday, fridays. 90 tablet 3  . Multiple Vitamin (MULTIVITAMIN) tablet Take 1 tablet by mouth daily.    . potassium chloride 20 MEQ TBCR Take 40 mEq by mouth daily. 60 tablet 3  . torsemide (DEMADEX) 20 MG tablet Take 2 tablets (40MG ) by mouth once daily. 120 tablet 0   No current facility-administered medications for this visit.   Allergies:  Patient has no known allergies.   Social History: The patient  reports that he has never smoked. He has never used smokeless tobacco. He reports that he does not drink alcohol or use drugs.   Family History: The patient's family history includes CAD in his father; Diabetes in his brother, sister, sister, and sister.   ROS:  Please see the history of present illness. Otherwise, complete review of systems is positive for none.  All other systems are reviewed and negative.   Physical Exam: VS:  BP (!) 148/80 (BP Location: Left Arm, Patient Position: Sitting, Cuff Size: Normal)   Pulse 68   Ht 5\' 9"  (1.753 m)   Wt 161 lb 8 oz (73.3 kg)   BMI 23.85 kg/m , BMI Body mass index is 23.85 kg/m.  Wt Readings from Last 3 Encounters:  09/19/19 161 lb 8 oz (73.3 kg)  09/11/19 174 lb 8 oz (79.2 kg)  09/03/19 170 lb (77.1 kg)    General: Patient appears comfortable at rest. HEENT: Conjunctiva and lids normal, oropharynx clear with moist mucosa. Neck: Supple, no elevated JVP or carotid bruits, no thyromegaly. Lungs: Clear to auscultation, nonlabored breathing at rest. Cardiac: Regular rate and rhythm, no S3 or significant systolic murmur, no pericardial rub. Abdomen: Soft, nontender, no hepatomegaly, bowel sounds present, no guarding or rebound. Extremities: No pitting edema, distal pulses 2+. Skin: Warm and dry. Musculoskeletal: No kyphosis. Neuropsychiatric: Alert and oriented x3, affect grossly appropriate.  ECG:  An ECG dated 09/18/2019 was  personally reviewed today and demonstrated:  Normal sinus rhythm rate of 80, right axis deviation, nonspecific T wave abnormality, prolonged QT.  QT/QTc 414/477  Recent Labwork: 06/13/2019: TSH 2.826 06/19/2019: B Natriuretic Peptide 3,281.0 09/03/2019: ALT 38; AST 37; Hemoglobin 10.8; Magnesium 1.9; Platelets 340 09/11/2019: BUN 43; Creatinine, Ser 1.43; Potassium 3.5; Sodium 143     Component Value Date/Time   CHOL 138 06/26/2019 0409   TRIG 119 06/26/2019 0409   HDL 39 (L) 06/26/2019 0409   CHOLHDL 3.5 06/26/2019 0409   VLDL 24 06/26/2019 0409   LDLCALC 75 06/26/2019 0409    Other Studies Reviewed Today:  Transesophageal echocardiogram June 14, 2019 1. Left ventricular ejection fraction, by visual estimation, is 50 to 55%. The left ventricle has normal function. There is mildly increased left ventricular hypertrophy. 2. Global right ventricle has normal systolic function.The right ventricular size is normal. No increase in right ventricular wall thickness. 3. Left atrial size was normal. 4. No valve vegetation noted.  Echocardiogram June 13, 2019 transthoracic 1. Left ventricular ejection fraction, by visual estimation, is 45 to  50%. The left ventricle has mildly decreased function. Normal left ventricular size. There is no left ventricular hypertrophy. 2. Global right ventricle has normal systolic function.The right ventricular size is normal. No increase in right ventricular wall thickness. 3. Left atrial size was normal. 4. Right atrial size was normal. 5. The mitral valve is normal in structure. Mild mitral valve regurgitation. No evidence of mitral stenosis. 6. The tricuspid valve is normal in structure. Tricuspid valve regurgitation mild-moderate. 7. The aortic valve is normal in structure. Aortic valve regurgitation was not visualized by color flow Doppler. Structurally normal aortic valve, with no evidence of sclerosis or stenosis. 8. The pulmonic valve was  normal in structure. Pulmonic valve regurgitation is not visualized by color flow Doppler. 9. Mildly elevated pulmonary artery systolic pressure. 10. The inferior vena cava is normal in size with greater than 50% respiratory variability, suggesting right atrial pressure of 3 mmHg.  Left upper extremity vascular duplex study June 29, 2019 Summary: Right: No evidence of thrombosis in the subclavian. Left: No evidence of deep vein thrombosis in the upper extremity. Findings consistent with acute superficial vein thrombosis involving the left cephalic vein.  Assessment and Plan:  1. Essential hypertension   2. Acute on chronic combined systolic and diastolic CHF (congestive heart failure) ([employer])   3. Hypokalemia   4. PAF (paroxysmal atrial fibrillation) ([place])   5. Anemia of chronic disease   6. Stage 3 chronic kidney disease, unspecified whether stage 3a or 3b CKD    LABS !!!  1. Essential hypertension BP better today since last visit. 148/80. States his BP at home seems to stay in the range of 140's over 80's. Increase carvedilol 40 25 mg po bid.   2. Acute on chronic combined systolic and diastolic CHF (congestive heart failure) ([facility])  Recent TEE   Left ventricular ejection fraction, by visual estimation, is 50 to 55%. The left ventricle has normal function. There is mildly increased left ventricular hypertrophy. Patient states he has lost more weight since last visit. Today's weight 161 versus 174 lbs last visit on 09/11/2019.  3. Hypokalemia K had improved to 3.5 at last f/u. Get repeat BMET today due to metolazone being added at last visit.   4. PAF (paroxysmal atrial fibrillation) ([place]) EKG on 09/18/2019 showed NSR rate of 89. Get CBC at next follow up due to starting on Eliquis at last visit. EKG today shows NSR rate of 68 rightward axis septal infarct age undetermined. Patient states only occasional transient palpitations that are not bothersome.  5. Anemia of chronic  disease Hgb and Hct on 09/03/2019 10.8 and 24.4 improved from previous H & H 06/30/2019 of 8.1 and 24.6.   6. Stage 3 chronic kidney disease, unspecified whether stage 3a or 3b CKD Crt and GFR on 09/03/2019 1.36 and 58. Get BMET today.   Medication Adjustments/Labs and Tests Ordered: Current medicines are reviewed at length with the patient today.  Concerns regarding medicines are outlined above.    There are no Patient Instructions on file for this visit.       Signed, Levell July, NP 09/19/2019 3:25 PM    [facility] at [place], [place], [place] [address] Phone: [phone]; Fax: [phone]

## 2019-09-19 NOTE — Patient Instructions (Addendum)
Medication Instructions:  1- INCREASE Coreg to 1 tablet (25 mg total) twice daily  *If you need a refill on your cardiac medications before your next appointment, please call your pharmacy*  Lab Work: Your physician recommends that you return for lab work today at [facility]. (BMET) No appt is needed. Hours are M-F 7AM- 6 PM.  If you have labs (blood work) drawn today and your tests are completely normal, you will receive your results only by: Marland Kitchen MyChart Message (if you have MyChart) OR . A paper copy in the mail If you have any lab test that is abnormal or we need to change your treatment, we will call you to review the results.  Testing/Procedures: None ordered   Follow-Up: At [facility], you and your health needs are our priority.  As part of our continuing mission to provide you with exceptional heart care, we have created designated Provider Care Teams.  These Care Teams include your primary Cardiologist (physician) and Advanced Practice Providers (APPs -  Physician Assistants and Nurse Practitioners) who all work together to provide you with the care you need, when you need it.  Your next appointment:   1 month(s)  The format for your next appointment:   In Person  Provider:    You may see Ida Rogue, MD or Murray Hodgkins, NP.

## 2019-09-24 NOTE — Progress Notes (Signed)
This encounter was created in error - please disregard.

## 2019-10-19 NOTE — Progress Notes (Signed)
Cardiology Office Note  Date:  10/22/2019   ID:  Nathan [name], DOB Nov 04, 1960, MRN [id number]  PCP:  Nathan Late, MD   Chief Complaint  Patient presents with  . other    1 month f/u elevated BP and pt would like to discuss possible infection. Meds reviewed verbally with pt.    HPI:  Nathan [name] is a 59 y.o. male with past medical history of chronic combined systolic/diastolic heart failure,  cardiomyopathy (ischemic versus nonischemic),  paroxysmal atrial fib.,  left hydropneumothorax status post VATS October 2020, insulin-dependent diabetes chronic kidney disease stage III,  MRSA bacteremia,  anemia of chronic disease,  hypertension,  smoking,   Left ventricular ejection fraction is 45 to 50%.  September 2020 Hypokalemia Who presents for routine follow-up in the [facility] office for his diastolic and systolic CHF  Recent events from past several months reviewed with him, long hospitalizations at [facility], transfer to [facility] Discussed recovery, several office visit since that time to review with medication changes metoprolol to carvedilol  Went back to work, knee arthritis,  Poor strength, lost loss of muscle over the past 4 months Stopped metolazone 2 weeks ago when weight was less than to 150 pounds Reports weight has continued to drop Weights daily: at home: 142 pounds, trending down Worried about dehydration Still with swelling in his legs  Blood pressure elevated in office today At home blood pressure also elevated [id number] systolic  Does not eat out  EKG personally reviewed by myself on todays visit Shows normal sinus rhythm rate 72 bpm no significant ST-T wave changes  Prior records reviewed with him in detail admitted in September 2020 for worsening bilateral lower extremity edema, anasarca , and dyspnea.  CT showed LL lobe consolidation and pleural effusion.  Treated with IV lasix  concern for left hydro-pnuemothorax ,  transferred to [facility].  VATS  performed on October 6 , 2020.   developed AKI secondary to diuresis.   D/C weight of 170 lbs.  He did not follow up at d/c, stopped his medications.  referred back by PCP with significant volume overload.   Started on Torsemide with 10 lb wt loss on f/u up on September 03 2019 however he was hypokalemic and sent to ED for K of 2.9.  continued to have significant LE edema and lower abdominal edema on torsemide 40 mg daily.  Metolazone was added.    PMH:   has a past medical history of Anemia of chronic disease, Chronic combined systolic (congestive) and diastolic (congestive) heart failure ([place]), CKD (chronic kidney disease), stage III, Diabetes mellitus without complication ([place]), Hypertension, Left Hydropneumothorax/Fibrinopurulent empyema, PAF (paroxysmal atrial fibrillation) ([place]), and Sepsis ([place]).  PSH:    Past Surgical History:  Procedure Laterality Date  . HAND SURGERY Right   . I & D EXTREMITY Right 06/11/2019   Procedure: IRRIGATION AND DEBRIDEMENT RIGHT THUMB;  Surgeon: Nathan Leep, MD;  Location: [facility] ORS;  Service: Orthopedics;  Laterality: Right;  . TEE WITHOUT CARDIOVERSION N/A 06/14/2019   Procedure: TRANSESOPHAGEAL ECHOCARDIOGRAM (TEE);  Surgeon: Nathan Merritts, MD;  Location: [facility] ORS;  Service: Cardiovascular;  Laterality: N/A;  . VIDEO ASSISTED THORACOSCOPY (VATS)/THOROCOTOMY Left 06/26/2019   Procedure: VIDEO ASSISTED THORACOSCOPY DECORTICATION;  Surgeon: Nathan Matte, MD;  Location: [place];  Service: Thoracic;  Laterality: Left;  Marland Kitchen VIDEO BRONCHOSCOPY N/A 06/26/2019   Procedure: VIDEO BRONCHOSCOPY;  Surgeon: Nathan Matte, MD;  Location: [place];  Service: Thoracic;  Laterality: N/A;  Current Outpatient Medications  Medication Sig Dispense Refill  . apixaban (ELIQUIS) 5 MG TABS tablet Take 1 tablet (5 mg total) by mouth 2 (two) times daily. 60 tablet   . carvedilol (COREG) 25 MG tablet Take 1 tablet (25 mg total) by mouth 2 (two) times daily. 180  tablet 3  . ferrous sulfate 325 (65 FE) MG tablet Take 1 tablet (325 mg total) by mouth 2 (two) times daily with a meal. (Patient taking differently: Take 325 mg by mouth daily with breakfast. ) 60 tablet 1  . insulin NPH-regular Human (70-30) 100 UNIT/ML injection Inject 5 Units into the skin daily.    . Multiple Vitamin (MULTIVITAMIN) tablet Take 1 tablet by mouth daily.    . potassium chloride 20 MEQ TBCR Take 40 mEq by mouth daily. 60 tablet 3  . torsemide (DEMADEX) 20 MG tablet Take 2 tablets (40MG ) by mouth once daily. 120 tablet 0  . metolazone (ZAROXOLYN) 2.5 MG tablet Take 1 tablet (2.5 mg total) once daily on Monday, Wednesday, fridays. (Patient not taking: Reported on 10/22/2019) 90 tablet 3   No current facility-administered medications for this visit.     Allergies:   Patient has no known allergies.   Social History:  The patient  reports that he has never smoked. He has never used smokeless tobacco. He reports that he does not drink alcohol or use drugs.   Family History:   family history includes CAD in his father; Diabetes in his brother, sister, sister, and sister.    Review of Systems: Review of Systems  Constitutional: Negative.   HENT: Negative.   Respiratory: Negative.   Cardiovascular: Positive for leg swelling.  Gastrointestinal: Negative.   Musculoskeletal: Negative.   Neurological: Negative.   Psychiatric/Behavioral: Negative.   All other systems reviewed and are negative.   PHYSICAL EXAM: VS:  BP (!) 180/90 (BP Location: Left Arm, Patient Position: Sitting, Cuff Size: Normal)   Pulse 72   Ht 5\' 9"  (1.753 m)   Wt 157 lb 8 oz (71.4 kg)   SpO2 98%   BMI 23.26 kg/m  , BMI Body mass index is 23.26 kg/m. GEN: Well nourished, well developed, in no acute distress HEENT: normal Neck: no JVD, carotid bruits, or masses Cardiac: RRR; no murmurs, rubs, or gallops, trace to 1+ pitting woody lower extremity edema posterior aspect of his legs below the  knees Respiratory:  clear to auscultation bilaterally, normal work of breathing GI: soft, nontender, nondistended, + BS MS: no deformity or atrophy Skin: warm and dry, no rash Neuro:  Strength and sensation are intact Psych: euthymic mood, full affect   Recent Labs: 06/13/2019: TSH 2.826 06/19/2019: B Natriuretic Peptide 3,281.0 09/03/2019: ALT 38; Hemoglobin 10.8; Magnesium 1.9; Platelets 340 09/19/2019: BUN 56; Creatinine, Ser 1.62; Potassium 4.9; Sodium 143    Lipid Panel Lab Results  Component Value Date   CHOL 138 06/26/2019   HDL 39 (L) 06/26/2019   LDLCALC 75 06/26/2019   TRIG 119 06/26/2019      Wt Readings from Last 3 Encounters:  10/22/19 157 lb 8 oz (71.4 kg)  09/19/19 161 lb 8 oz (73.3 kg)  09/11/19 174 lb 8 oz (79.2 kg)       ASSESSMENT AND PLAN:  Problem List Items Addressed This Visit      Cardiology Problems   Hypertension   Acute on chronic combined systolic and diastolic CHF (congestive heart failure) (HCC) - Primary   Relevant Orders   EKG 12-Lead  Other   CKD (chronic kidney disease), stage III    Other Visit Diagnoses    PAF (paroxysmal atrial fibrillation) (HCC)       Hypokalemia         Acute on chronic systolic and diastolic CHF Weight continues to improve on torsemide 40 daily He stopped metolazone on his own 2 weeks ago Lab work today CBC and CMP Still with trace woody pitting edema lower extremities below the knees in the calf area He does appreciate the swelling,  CHF education started today in the office Reports he does not eat out Recommended we need to work aggressively on his blood pressure  Essential hypertension Has been running high the past several office visits despite changes also running high at home he reports typically [id number] systolic No recent lab work available, previously with renal dysfunction For now we will avoid ACE, ARB We have recommended we start Imdur 30 daily He prefers not to take hydralazine 3  times daily as he is working long hours Discussed potential side effects Other options include clonidine, Cardura Would avoid calcium channel blockers given low ejection fraction and he already has leg edema  Disposition:   F/U 3 months  CHF education started in the office today, long discussion concerning management of his hypertension and recent long hospital course, weight loss  Total encounter time more than 45 minutes  Greater than 50% was spent in counseling and coordination of care with the patient    Signed, Esmond Plants, M.D., Ph.D. [place], [place]

## 2019-10-22 ENCOUNTER — Ambulatory Visit (INDEPENDENT_AMBULATORY_CARE_PROVIDER_SITE_OTHER): Payer: Self-pay | Admitting: Cardiovascular Disease

## 2019-10-22 ENCOUNTER — Other Ambulatory Visit: Payer: Self-pay

## 2019-10-22 ENCOUNTER — Encounter: Payer: Self-pay | Admitting: Cardiovascular Disease

## 2019-10-22 VITALS — BP 180/90 | HR 72 | Ht 69.0 in | Wt 157.5 lb

## 2019-10-22 DIAGNOSIS — I480 Paroxysmal atrial fibrillation: Secondary | ICD-10-CM

## 2019-10-22 DIAGNOSIS — I5043 Acute on chronic combined systolic (congestive) and diastolic (congestive) heart failure: Secondary | ICD-10-CM

## 2019-10-22 DIAGNOSIS — E876 Hypokalemia: Secondary | ICD-10-CM

## 2019-10-22 DIAGNOSIS — I10 Essential (primary) hypertension: Secondary | ICD-10-CM

## 2019-10-22 DIAGNOSIS — N183 Chronic kidney disease, stage 3 unspecified: Secondary | ICD-10-CM

## 2019-10-22 MED ORDER — ISOSORBIDE MONONITRATE ER 30 MG PO TB24: 30.0000 mg | ORAL_TABLET | Freq: Every day | ORAL | 3 refills | Status: DC

## 2019-10-22 NOTE — Patient Instructions (Addendum)
Call with some blood pressure Your physician has requested that you regularly monitor and record your blood pressure readings at home. Please use the same machine at the same time of day to check your readings and record them to bring to your follow-up visit.  Read about Blood pressure monitoring listed below.   Medication Instructions:  Your physician has recommended you make the following change in your medication:  1. START Isosorbide mononitrate (Imdur) 30 mg once daily for blood pressure  If you need a refill on your cardiac medications before your next appointment, please call your pharmacy.    Lab work: CMP & CBC done today.   If you have labs (blood work) drawn today and your tests are completely normal, you will receive your results only by: Marland Kitchen MyChart Message (if you have MyChart) OR . A paper copy in the mail If you have any lab test that is abnormal or we need to change your treatment, we will call you to review the results.   Testing/Procedures: No new testing needed   Follow-Up: At [facility], you and your health needs are our priority.  As part of our continuing mission to provide you with exceptional heart care, we have created designated Provider Care Teams.  These Care Teams include your primary Cardiologist (physician) and Advanced Practice Providers (APPs -  Physician Assistants and Nurse Practitioners) who all work together to provide you with the care you need, when you need it.  . You will need a follow up appointment in 3 months   . Providers on your designated Care Team:   . Murray Hodgkins, NP . Christell Faith, PA-C . Marrianne Mood, PA-C  Any Other Special Instructions Will Be Listed Below (If Applicable).  For educational health videos Log in to : [email or web address] Or : [email or web address], password : [email or web address]    How to Take Your Blood Pressure You can take your blood pressure at home with a machine. You may need to check your blood pressure at  home:  To check if you have high blood pressure (hypertension).  To check your blood pressure over time.  To make sure your blood pressure medicine is working. Supplies needed: You will need a blood pressure machine, or monitor. You can buy one at a drugstore or online. When choosing one:  Choose one with an arm cuff.  Choose one that wraps around your upper arm. Only one finger should fit between your arm and the cuff.  Do not choose one that measures your blood pressure from your wrist or finger. Your doctor can suggest a monitor. How to prepare Avoid these things for 30 minutes before checking your blood pressure:  Drinking caffeine.  Drinking alcohol.  Eating.  Smoking.  Exercising. Five minutes before checking your blood pressure:  Pee.  Sit in a dining chair. Avoid sitting in a soft couch or armchair.  Be quiet. Do not talk. How to take your blood pressure Follow the instructions that came with your machine. If you have a digital blood pressure monitor, these may be the instructions: 1. Sit up straight. 2. Place your feet on the floor. Do not cross your ankles or legs. 3. Rest your left arm at the level of your heart. You may rest it on a table, desk, or chair. 4. Pull up your shirt sleeve. 5. Wrap the blood pressure cuff around the upper part of your left arm. The cuff should be 1 inch (2.5 cm) above your elbow. It is best to  wrap the cuff around bare skin. 6. Fit the cuff snugly around your arm. You should be able to place only one finger between the cuff and your arm. 7. Put the cord inside the groove of your elbow. 8. Press the power button. 9. Sit quietly while the cuff fills with air and loses air. 10. Write down the numbers on the screen. 11. Wait 2-3 minutes and then repeat steps 1-10. What do the numbers mean? Two numbers make up your blood pressure. The first number is called systolic pressure. The second is called diastolic pressure. An example of a  blood pressure reading is "120 over 80" (or 120/80). If you are an adult and do not have a medical condition, use this guide to find out if your blood pressure is normal: Normal  First number: below 120.  Second number: below 80. Elevated  First number: 120-129.  Second number: below 80. Hypertension stage 1  First number: 130-139.  Second number: 80-89. Hypertension stage 2  First number: 140 or above.  Second number: 32 or above. Your blood pressure is above normal even if only the top or bottom number is above normal. Follow these instructions at home:  Check your blood pressure as often as your doctor tells you to.  Take your monitor to your next doctor's appointment. Your doctor will: ? Make sure you are using it correctly. ? Make sure it is working right.  Make sure you understand what your blood pressure numbers should be.  Tell your doctor if your medicines are causing side effects. Contact a doctor if:  Your blood pressure keeps being high. Get help right away if:  Your first blood pressure number is higher than 180.  Your second blood pressure number is higher than 120. This information is not intended to replace advice given to you by your health care provider. Make sure you discuss any questions you have with your health care provider. Document Revised: 08/19/2017 Document Reviewed: 02/13/2016 Elsevier Patient Education  2020 [place].  Heart Failure, Self Care Heart failure is a serious condition. This sheet explains things you need to do to take care of yourself at home. To help you stay as healthy as possible, you may be asked to change your diet, take certain medicines, and make other changes in your life. Your doctor may also give you more specific instructions. If you have problems or questions, call your doctor. What are the risks? Having heart failure makes it more likely for you to have some problems. These problems can get worse if you do  not take good care of yourself. Problems may include:  Blood clotting problems. This may cause a stroke.  Damage to the kidneys, liver, or lungs.  Abnormal heart rhythms. Supplies needed:  Scale for weighing yourself.  Blood pressure monitor.  Notebook.  Medicines. How to care for yourself when you have heart failure Medicines Take over-the-counter and prescription medicines only as told by your doctor. Take your medicines every day.  Do not stop taking your medicine unless your doctor tells you to do so.  Do not skip any medicines.  Get your prescriptions refilled before you run out of medicine. This is important. Eating and drinking   Eat heart-healthy foods. Talk with a diet specialist (dietitian) to create an eating plan.  Choose foods that: ? Have no trans fat. ? Are low in saturated fat and cholesterol.  Choose healthy foods, such as: ? Fresh or frozen fruits and vegetables. ? Fish. ?  Low-fat (lean) meats. ? Legumes, such as beans, peas, and lentils. ? Fat-free or low-fat dairy products. ? Whole-grain foods. ? High-fiber foods.  Limit salt (sodium) if told by your doctor. Ask your diet specialist to tell you which seasonings are healthy for your heart.  Cook in healthy ways instead of frying. Healthy ways of cooking include roasting, grilling, broiling, baking, poaching, steaming, and stir-frying.  Limit how much fluid you drink, if told by your doctor. Alcohol use  Do not drink alcohol if: ? Your doctor tells you not to drink. ? Your heart was damaged by alcohol, or you have very bad heart failure. ? You are pregnant, may be pregnant, or are planning to become pregnant.  If you drink alcohol: ? Limit how much you use to:  0-1 drink a day for women.  0-2 drinks a day for men. ? Be aware of how much alcohol is in your drink. In the [place] one drink equals one 12 oz bottle of beer (355 mL), one 5 oz glass of wine (148 mL), or one 1 oz glass of hard  liquor (44 mL). Lifestyle   Do not use any products that contain nicotine or tobacco, such as cigarettes, e-cigarettes, and chewing tobacco. If you need help quitting, ask your doctor. ? Do not use nicotine gum or patches before talking to your doctor.  Do not use illegal drugs.  Lose weight if told by your doctor.  Do physical activity if told by your doctor. Talk to your doctor before you begin an exercise if: ? You are an older adult. ? You have very bad heart failure.  Learn to manage stress. If you need help, ask your doctor.  Get rehab (rehabilitation) to help you stay independent and to help with your quality of life.  Plan time to rest when you get tired. Check weight and blood pressure   Weigh yourself every day. This will help you to know if fluid is building up in your body. ? Weigh yourself every morning after you pee (urinate) and before you eat breakfast. ? Wear the same amount of clothing each time. ? Write down your daily weight. Give your record to your doctor.  Check and write down your blood pressure as told by your doctor.  Check your pulse as told by your doctor. Dealing with very hot and very cold weather  If it is very hot: ? Avoid activities that take a lot of energy. ? Use air conditioning or fans, or find a cooler place. ? Avoid caffeine and alcohol. ? Wear clothing that is loose-fitting, lightweight, and light-colored.  If it is very cold: ? Avoid activities that take a lot of energy. ? Layer your clothes. ? Wear mittens or gloves, a hat, and a scarf when you go outside. ? Avoid alcohol. Follow these instructions at home:  Stay up to date with shots (vaccines). Get pneumococcal and flu (influenza) shots.  Keep all follow-up visits as told by your doctor. This is important. Contact a doctor if:  You gain weight quickly.  You have increasing shortness of breath.  You cannot do your normal activities.  You get tired easily.  You cough  a lot.  You don't feel like eating or feel like you may vomit (nauseous).  You become puffy (swell) in your hands, feet, ankles, or belly (abdomen).  You cannot sleep well because it is hard to breathe.  You feel like your heart is beating fast (palpitations).  You get dizzy  when you stand up. Get help right away if:  You have trouble breathing.  You or someone else notices a change in your behavior, such as having trouble staying awake.  You have chest pain or discomfort.  You pass out (faint). These symptoms may be an emergency. Do not wait to see if the symptoms will go away. Get medical help right away. Call your local emergency services (911 in the [place] Do not drive yourself to the hospital. Summary  Heart failure is a serious condition. To care for yourself, you may have to change your diet, take medicines, and make other lifestyle changes.  Take your medicines every day. Do not stop taking them unless your doctor tells you to do so.  Eat heart-healthy foods, such as fresh or frozen fruits and vegetables, fish, lean meats, legumes, fat-free or low-fat dairy products, and whole-grain or high-fiber foods.  Ask your doctor if you can drink alcohol. You may have to stop alcohol use if you have very bad heart failure.  Contact your doctor if you gain weight quickly or feel that your heart is beating too fast. Get help right away if you pass out, or have chest pain or trouble breathing. This information is not intended to replace advice given to you by your health care provider. Make sure you discuss any questions you have with your health care provider. Document Revised: 12/19/2018 Document Reviewed: 12/20/2018 Elsevier Patient Education  [place].  Heart Failure Action Plan A heart failure action plan helps you understand what to do when you have symptoms of heart failure. Follow the plan that was created by you and your health care provider. Review your plan each  time you visit your health care provider. Red zone These signs and symptoms mean you should get medical help right away:  You have trouble breathing when resting.  You have a dry cough that is getting worse.  You have swelling or pain in your legs or abdomen that is getting worse.  You suddenly gain more than 2-3 lb (0.9-1.4 kg) in a day, or more than 5 lb (2.3 kg) in one week. This amount may be more or less depending on your condition.  You have trouble staying awake or you feel confused.  You have chest pain.  You do not have an appetite.  You pass out. If you experience any of these symptoms:  Call your local emergency services (911 in the [place] right away or seek help at the emergency department of the nearest hospital. Yellow zone These signs and symptoms mean your condition may be getting worse and you should make some changes:  You have trouble breathing when you are active or you need to sleep with extra pillows.  You have swelling in your legs or abdomen.  You gain 2-3 lb (0.9-1.4 kg) in one day, or 5 lb (2.3 kg) in one week. This amount may be more or less depending on your condition.  You get tired easily.  You have trouble sleeping.  You have a dry cough. If you experience any of these symptoms:  Contact your health care provider within the next day.  Your health care provider may adjust your medicines. Green zone These signs mean you are doing well and can continue what you are doing:  You do not have shortness of breath.  You have very little swelling or no new swelling.  Your weight is stable (no gain or loss).  You have a normal activity level.  You do not have chest pain or any other new symptoms. Follow these instructions at home:  Take over-the-counter and prescription medicines only as told by your health care provider.  Weigh yourself daily. Your target weight is __________ lb (__________ kg). ? Call your health care provider if you gain  more than __________ lb (__________ kg) in a day, or more than __________ lb (__________ kg) in one week.  Eat a heart-healthy diet. Work with a diet and nutrition specialist (dietitian) to create an eating plan that is best for you.  Keep all follow-up visits as told by your health care provider. This is important. Where to find more information  American Heart Association: www.heart.org Summary  Follow the action plan that was created by you and your health care provider.  Get help right away if you have any symptoms in the [place]. This information is not intended to replace advice given to you by your health care provider. Make sure you discuss any questions you have with your health care provider. Document Revised: 08/19/2017 Document Reviewed: 10/16/2016 Elsevier Patient Education  2020 [employer].

## 2019-10-23 LAB — COMPREHENSIVE METABOLIC PANEL
ALT: 115 IU/L — ABNORMAL HIGH (ref 0–44)
AST: 69 IU/L — ABNORMAL HIGH (ref 0–40)
Albumin/Globulin Ratio: 1.6 (ref 1.2–2.2)
Albumin: 3.4 g/dL — ABNORMAL LOW (ref 3.8–4.9)
Alkaline Phosphatase: 1048 IU/L (ref 39–117)
BUN/Creatinine Ratio: 31 — ABNORMAL HIGH (ref 9–20)
BUN: 48 mg/dL — ABNORMAL HIGH (ref 6–24)
Bilirubin Total: 0.3 mg/dL (ref 0.0–1.2)
CO2: 26 mmol/L (ref 20–29)
Calcium: 8.5 mg/dL — ABNORMAL LOW (ref 8.7–10.2)
Chloride: 103 mmol/L (ref 96–106)
Creatinine, Ser: 1.55 mg/dL — ABNORMAL HIGH (ref 0.76–1.27)
GFR calc Af Amer: 57 mL/min/{1.73_m2} — ABNORMAL LOW (ref >59)
GFR calc non Af Amer: 49 mL/min/{1.73_m2} — ABNORMAL LOW (ref >59)
Globulin, Total: 2.1 g/dL (ref 1.5–4.5)
Glucose: 177 mg/dL — ABNORMAL HIGH (ref 65–99)
Potassium: 4.9 mmol/L (ref 3.5–5.2)
Sodium: 141 mmol/L (ref 134–144)
Total Protein: 5.5 g/dL — ABNORMAL LOW (ref 6.0–8.5)

## 2019-10-23 LAB — CBC
Hematocrit: 29.1 % — ABNORMAL LOW (ref 37.5–51.0)
Hemoglobin: 9.1 g/dL — ABNORMAL LOW (ref 13.0–17.7)
MCH: 29 pg (ref 26.6–33.0)
MCHC: 31.3 g/dL — ABNORMAL LOW (ref 31.5–35.7)
MCV: 93 fL (ref 79–97)
Platelets: 408 10*3/uL (ref 150–450)
RBC: 3.14 x10E6/uL — ABNORMAL LOW (ref 4.14–5.80)
RDW: 12.9 % (ref 11.6–15.4)
WBC: 7.9 10*3/uL (ref 3.4–10.8)

## 2019-10-24 ENCOUNTER — Other Ambulatory Visit: Payer: Self-pay | Admitting: Physician Assistant

## 2019-10-29 ENCOUNTER — Telehealth: Payer: Self-pay | Admitting: *Deleted

## 2019-10-29 ENCOUNTER — Ambulatory Visit
Admission: RE | Admit: 2019-10-29 | Discharge: 2019-10-29 | Disposition: A | Discharge status: Home / Self Care | Payer: Self-pay | Source: Ambulatory Visit | Attending: Cardiovascular Disease | Admitting: Cardiovascular Disease

## 2019-10-29 ENCOUNTER — Other Ambulatory Visit: Payer: Self-pay

## 2019-10-29 DIAGNOSIS — R748 Abnormal levels of other serum enzymes: Secondary | ICD-10-CM | POA: Insufficient documentation

## 2019-10-29 NOTE — Telephone Encounter (Signed)
Spoke with Cloyde Reams at PCP office and advised that results are back on ultrasound and that we defer further treatment and testing to them. She verbalized understanding and will have them look at it first thing in the morning. Will reach out to patient to make him aware as well.

## 2019-10-29 NOTE — Telephone Encounter (Signed)
Spoke with patient and reviewed results and recommendations. He declined going to ED for more urgent work up and prefers to do outpatient imaging if possible. Advised that I would enter orders, call for scheduling and then reach back out to him with information and timing. He verbalized understanding with no further questions at this time.

## 2019-10-29 NOTE — Telephone Encounter (Signed)
Tried calling wife's number with no answer and no voicemail

## 2019-10-29 NOTE — Telephone Encounter (Signed)
Spoke with patient and reviewed that he should not eat or drink for 6 hours prior to ultrasound. He verbalized understanding with no further questions at this time.

## 2019-10-29 NOTE — Telephone Encounter (Signed)
Spoke with patient and reviewed preliminary findings and that his PCP office will take over his care for this. Instructed him to please let us know if he does not hear from them by the end of this week. He verbalized understanding with no further questions at this time.

## 2019-10-29 NOTE — Telephone Encounter (Signed)
Spoke with patient and reviewed that ultrasound was scheduled to be done at the [facility] imaging center today at 3:15 PM with arrival time of 3:00 PM. Reviewed that it was [address] across from [place]. Instructed him to not eat or drink anything until after he has this test done. He verbalized understanding of instructions, agreeable with testing to be done in [place], and advised that we would give him a call back today with those results. He verbalized understanding with no further questions at this time.

## 2019-10-29 NOTE — Telephone Encounter (Signed)
Spoke with Cloyde Reams at [facility] regarding patients abnormal labs and further work up recommendations. Advised that patient declined to go to ED and now I am not able to reach him to discuss further work up. Cloyde Reams will notify provider there as well. Will try to call again later.

## 2019-10-29 NOTE — Telephone Encounter (Signed)
Left voicemail message to call back  

## 2019-10-29 NOTE — Telephone Encounter (Signed)
-----  Message from Minna Merritts, MD sent at 10/27/2019 11:47 AM EST ----- Marked elevation of alk phos, (liver problem or bone) Also climb in AST/ALT (liver) Would consider STAT ABD u/s, RUQ eval with evaluation of gall bladder, ductal dilitation For any worsening sx, weakness, malaise, ABD pain, right upper quad pain, would go to the ER for expedited workup  Might need ABD CT for evaluation if u/s negative  Can we let primary care doc know Also need urgent eval by GI

## 2019-10-30 NOTE — Telephone Encounter (Signed)
Would consider calling back later this week If no response from primary care we can place referral to GI

## 2019-10-30 NOTE — Telephone Encounter (Signed)
Appt scheduled w/Dr. Lysle Pearl on 11/06/19 at 10:15am patient is also placed on a cancellation list for possible sooner appt.

## 2019-11-30 ENCOUNTER — Other Ambulatory Visit: Payer: Self-pay | Admitting: Gastroenterology

## 2019-11-30 DIAGNOSIS — R748 Abnormal levels of other serum enzymes: Secondary | ICD-10-CM

## 2019-11-30 DIAGNOSIS — R7401 Elevation of levels of liver transaminase levels: Secondary | ICD-10-CM

## 2020-01-18 ENCOUNTER — Other Ambulatory Visit: Payer: Self-pay

## 2020-01-18 MED ORDER — POTASSIUM CHLORIDE ER 20 MEQ PO TBCR: 40.0000 meq | EXTENDED_RELEASE_TABLET | Freq: Every day | ORAL | 3 refills | Status: DC

## 2020-01-19 NOTE — Progress Notes (Signed)
Cardiology Office Note  Date:  01/21/2020   ID:  Nathan [name], DOB 05/18/1961, MRN [id number]  PCP:  Nathan Late, MD   Chief Complaint  Patient presents with  . OTHER    3 month f/u dicuss d/c medications due to no swelling/weight loss. Meds reviewed verbally with pt.    HPI:  Nathan [name] is a 59 y.o. male with past medical history of chronic combined systolic/diastolic heart failure,  cardiomyopathy (ischemic versus nonischemic),  paroxysmal atrial fib.,  left hydropneumothorax status post VATS October 2020, insulin-dependent diabetes chronic kidney disease stage III,  MRSA bacteremia,  anemia of chronic disease,  hypertension,  smoking,   Left ventricular ejection fraction is 45 to 50%.  September 2020 Hypokalemia Who presents for follow-up of his diastolic and systolic CHF  admitted in September 2020 for worsening bilateral lower extremity edema, anasarca , and dyspnea.  CT showed LL lobe consolidation and pleural effusion.  Treated with IV lasix  concern for left hydro-pnuemothorax ,  transferred to [facility].  VATS performed on October 6 , 2020.   developed AKI secondary to diuresis.  Discharge weight 170 pounds  He did not follow up at d/c, stopped his medications.  referred back by PCP with significant volume overload.   Started on Torsemide with 10 lb wt loss on f/u up on September 03 2019  Was very weak when he went back to work On last clinic visit had stopped metolazone when weight was less than 150 pounds Continued to lose weight down to 142 Still has swelling in his legs  blood pressure was elevated on last clinic visit  Lab work after our clinic visit February 2021 transaminitis Ultrasound done no gallstones, mild ascites Was seen by GI surgery November 19, 2019 GGT significantly elevated concerning for liver origin Autoimmune, ceruloplasmin, hepatitis negative, iron levels normal It was recommended he have a CT liver Does not appear that this  was ever performed, order pending   CR up to 1.9 on labs at [place] Decreased torsemide down to 20 mg daily, takes it at night Dry throat, does not drink much No leg swelling, no ascites  BP elevated in the AM:  170 to 180 before meds Tried imdur BID, had headache Down 20 pounds since jan 2021, "I have gained 10 in past month"  Takes insulin, 4-5 units, depends in "what I eat" Glucose today 160-170  EKG personally reviewed by myself on todays visit Shows normal sinus rhythm rate 72 bpm nonspecifict wave    PMH:   has a past medical history of Anemia of chronic disease, Chronic combined systolic (congestive) and diastolic (congestive) heart failure (Nathan [name]), CKD (chronic kidney disease), stage III, Diabetes mellitus without complication (Nathan [name]), Hypertension, Left Hydropneumothorax/Fibrinopurulent empyema, PAF (paroxysmal atrial fibrillation) (Nathan [name]), and Sepsis (Nathan [name]).  PSH:    Past Surgical History:  Procedure Laterality Date  . HAND SURGERY Right   . I & D EXTREMITY Right 06/11/2019   Procedure: IRRIGATION AND DEBRIDEMENT RIGHT THUMB;  Surgeon: Nathan Leep, MD;  Location: [facility] ORS;  Service: Orthopedics;  Laterality: Right;  . TEE WITHOUT CARDIOVERSION N/A 06/14/2019   Procedure: TRANSESOPHAGEAL ECHOCARDIOGRAM (TEE);  Surgeon: Nathan Merritts, MD;  Location: [facility] ORS;  Service: Cardiovascular;  Laterality: N/A;  . VIDEO ASSISTED THORACOSCOPY (VATS)/THOROCOTOMY Left 06/26/2019   Procedure: VIDEO ASSISTED THORACOSCOPY DECORTICATION;  Surgeon: Nathan Matte, MD;  Location: [place];  Service: Thoracic;  Laterality: Left;  Marland Kitchen VIDEO BRONCHOSCOPY N/A 06/26/2019   Procedure: VIDEO BRONCHOSCOPY;  Surgeon: Nathan Matte, MD;  Location: [facility] OR;  Service: Thoracic;  Laterality: N/A;    Current Outpatient Medications  Medication Sig Dispense Refill  . apixaban (ELIQUIS) 5 MG TABS tablet Take 1 tablet (5 mg total) by mouth 2 (two) times daily. 60 tablet   . carvedilol (COREG) 25 MG  tablet Take 1 tablet (25 mg total) by mouth 2 (two) times daily. 180 tablet 3  . ferrous sulfate 325 (65 FE) MG tablet Take 1 tablet (325 mg total) by mouth 2 (two) times daily with a meal. (Patient taking differently: Take 325 mg by mouth daily with breakfast. ) 60 tablet 1  . insulin NPH-regular Human (70-30) 100 UNIT/ML injection Inject 5 Units into the skin daily.    . isosorbide mononitrate (IMDUR) 30 MG 24 hr tablet Take 1 tablet (30 mg total) by mouth daily. 90 tablet 3  . Multiple Vitamin (MULTIVITAMIN) tablet Take 1 tablet by mouth daily.    . Potassium Chloride ER 20 MEQ TBCR Take 40 mEq by mouth daily. 60 tablet 3  . torsemide (DEMADEX) 20 MG tablet Take 2 tablets (40 mg total) by mouth daily. (Patient taking differently: Take 20 mg by mouth daily. ) 60 tablet 3   No current facility-administered medications for this visit.     Allergies:   Patient has no known allergies.   Social History:  The patient  reports that he has never smoked. He has never used smokeless tobacco. He reports that he does not drink alcohol or use drugs.   Family History:   family history includes CAD in his father; Diabetes in his brother, sister, sister, and sister.    Review of Systems: Review of Systems  Constitutional: Positive for weight loss.  HENT: Negative.   Respiratory: Negative.   Cardiovascular: Negative.   Gastrointestinal: Negative.   Musculoskeletal: Negative.   Neurological: Negative.   Psychiatric/Behavioral: Negative.   All other systems reviewed and are negative.   PHYSICAL EXAM: VS:  BP (!) 176/90 (BP Location: Left Arm, Patient Position: Sitting, Cuff Size: Normal)   Pulse 72   Ht _0  (1.753 m)   Wt 141 lb 6 oz (64.1 kg)   SpO2 99%   BMI 20.88 kg/m  , BMI Body mass index is 20.88 kg/m. Constitutional:  oriented to person, place, and time. No distress. , very thin HENT:  Head: Grossly normal Eyes:  no discharge. No scleral icterus.  Neck: No JVD, no carotid bruits   Cardiovascular: Regular rate and rhythm, no murmurs appreciated Pulmonary/Chest: Clear to auscultation bilaterally, no wheezes or rails Abdominal: Soft.  no distension.  no tenderness.  Musculoskeletal: Normal range of motion Neurological:  normal muscle tone. Coordination normal. No atrophy Skin: Skin warm and dry Psychiatric: normal affect, pleasant   Recent Labs: 06/13/2019: TSH 2.826 06/19/2019: B Natriuretic Peptide 3,281.0 09/03/2019: Magnesium 1.9 10/22/2019: ALT 115; BUN 48; Creatinine, Ser 1.55; Hemoglobin 9.1; Platelets 408; Potassium 4.9; Sodium 141    Lipid Panel Lab Results  Component Value Date   CHOL 138 06/26/2019   HDL 39 (L) 06/26/2019   LDLCALC 75 06/26/2019   TRIG 119 06/26/2019      Wt Readings from Last 3 Encounters:  01/21/20 141 lb 6 oz (64.1 kg)  10/22/19 157 lb 8 oz (71.4 kg)  09/19/19 161 lb 8 oz (73.3 kg)      ASSESSMENT AND PLAN:  Problem List Items Addressed This Visit      Cardiology Problems   Hypertension  Relevant Orders   Comp Met (CMET)   Gamma GT   HgB A1c   Acute on chronic combined systolic and diastolic CHF (congestive heart failure) (HCC) - Primary   Relevant Orders   Comp Met (CMET)   Gamma GT   HgB A1c     Other   CKD (chronic kidney disease), stage III   Relevant Orders   Comp Met (CMET)   Gamma GT   HgB A1c    Other Visit Diagnoses    PAF (paroxysmal atrial fibrillation) (HCC)       Relevant Orders   EKG 12-Lead   Comp Met (CMET)   Gamma GT   HgB A1c   Cardiomyopathy, unspecified type (HCC)       Relevant Orders   Comp Met (CMET)   Gamma GT   HgB A1c     Acute on chronic systolic and diastolic CHF Labs today Adjust torsemide accordingly, BP elevated, would like to add ARB, will wait on the labs  Essential hypertension Labs pending options include clonidine, Cardura Would avoid calcium channel blockers given low ejection fraction and he prior leg edema  CKD Up to 1.9 with GI  M,arch 2021 He  was unaware Repeats labs today, this will help guide his diuresis  Transaminitis He does not want CT scan abdomen pelvis, unclear reasons This was ordered by GI We have ordered repeat GGT  Diabetes type 2 Hemoglobin A1c ordered  Disposition:   F/U 6 months   Total encounter time more than 25 minutes  Greater than 50% was spent in counseling and coordination of care with the patient    Signed, Esmond Plants, M.D., Ph.D. [place], [place]

## 2020-01-21 ENCOUNTER — Encounter: Payer: Self-pay | Admitting: Cardiovascular Disease

## 2020-01-21 ENCOUNTER — Other Ambulatory Visit: Payer: Self-pay

## 2020-01-21 ENCOUNTER — Ambulatory Visit (INDEPENDENT_AMBULATORY_CARE_PROVIDER_SITE_OTHER): Payer: Self-pay | Admitting: Cardiovascular Disease

## 2020-01-21 VITALS — BP 176/90 | HR 72 | Ht 69.0 in | Wt 141.4 lb

## 2020-01-21 DIAGNOSIS — I5043 Acute on chronic combined systolic (congestive) and diastolic (congestive) heart failure: Secondary | ICD-10-CM

## 2020-01-21 DIAGNOSIS — I480 Paroxysmal atrial fibrillation: Secondary | ICD-10-CM

## 2020-01-21 DIAGNOSIS — N183 Chronic kidney disease, stage 3 unspecified: Secondary | ICD-10-CM

## 2020-01-21 DIAGNOSIS — I10 Essential (primary) hypertension: Secondary | ICD-10-CM

## 2020-01-21 DIAGNOSIS — I429 Cardiomyopathy, unspecified: Secondary | ICD-10-CM

## 2020-01-21 NOTE — Patient Instructions (Addendum)
Medication Instructions:  No changes  If you need a refill on your cardiac medications before your next appointment, please call your pharmacy.    Lab work: CMP, A1c, & GGT test done today.    If you have labs (blood work) drawn today and your tests are completely normal, you will receive your results only by: Marland Kitchen MyChart Message (if you have MyChart) OR . A paper copy in the mail If you have any lab test that is abnormal or we need to change your treatment, we will call you to review the results.   Testing/Procedures: No new testing needed   Follow-Up: At [facility], you and your health needs are our priority.  As part of our continuing mission to provide you with exceptional heart care, we have created designated Provider Care Teams.  These Care Teams include your primary Cardiologist (physician) and Advanced Practice Providers (APPs -  Physician Assistants and Nurse Practitioners) who all work together to provide you with the care you need, when you need it.  . You will need a follow up appointment in 6 months   . Providers on your designated Care Team:   . Murray Hodgkins, NP . Christell Faith, PA-C . Marrianne Mood, PA-C  Any Other Special Instructions Will Be Listed Below (If Applicable).  For educational health videos Log in to : [email or web address] Or : [email or web address], password : [email or web address]

## 2020-01-22 LAB — COMPREHENSIVE METABOLIC PANEL
ALT: 28 IU/L (ref 0–44)
AST: 55 IU/L — ABNORMAL HIGH (ref 0–40)
Albumin/Globulin Ratio: 1.6 (ref 1.2–2.2)
Albumin: 3.3 g/dL — ABNORMAL LOW (ref 3.8–4.9)
Alkaline Phosphatase: 172 IU/L — ABNORMAL HIGH (ref 39–117)
BUN/Creatinine Ratio: 30 — ABNORMAL HIGH (ref 9–20)
BUN: 60 mg/dL — ABNORMAL HIGH (ref 6–24)
Bilirubin Total: <0.2 mg/dL (ref 0.0–1.2)
CO2: 20 mmol/L (ref 20–29)
Calcium: 8.5 mg/dL — ABNORMAL LOW (ref 8.7–10.2)
Chloride: 111 mmol/L — ABNORMAL HIGH (ref 96–106)
Creatinine, Ser: 2.03 mg/dL — ABNORMAL HIGH (ref 0.76–1.27)
GFR calc Af Amer: 41 mL/min/{1.73_m2} — ABNORMAL LOW (ref >59)
GFR calc non Af Amer: 36 mL/min/{1.73_m2} — ABNORMAL LOW (ref >59)
Globulin, Total: 2.1 g/dL (ref 1.5–4.5)
Glucose: 165 mg/dL — ABNORMAL HIGH (ref 65–99)
Potassium: 5 mmol/L (ref 3.5–5.2)
Sodium: 144 mmol/L (ref 134–144)
Total Protein: 5.4 g/dL — ABNORMAL LOW (ref 6.0–8.5)

## 2020-01-22 LAB — HEMOGLOBIN A1C
Est. average glucose Bld gHb Est-mCnc: 151 mg/dL
Hgb A1c MFr Bld: 6.9 % — ABNORMAL HIGH (ref 4.8–5.6)

## 2020-01-22 LAB — GAMMA GT: GGT: 264 IU/L — ABNORMAL HIGH (ref 0–65)

## 2020-01-25 ENCOUNTER — Telehealth: Payer: Self-pay | Admitting: *Deleted

## 2020-01-25 DIAGNOSIS — I480 Paroxysmal atrial fibrillation: Secondary | ICD-10-CM

## 2020-01-25 MED ORDER — TORSEMIDE 20 MG PO TABS: 20.0000 mg | ORAL_TABLET | ORAL | 3 refills | Status: DC

## 2020-01-25 MED ORDER — POTASSIUM CHLORIDE ER 20 MEQ PO TBCR: 20.0000 meq | EXTENDED_RELEASE_TABLET | ORAL | Status: DC

## 2020-01-25 NOTE — Telephone Encounter (Signed)
Results called to pt. Pt verbalized understanding. Patient verbalized understanding to: stop torsemide over the weekend and starting next week torsemide 20 mg Monday Wednesday Friday with 1 potassium on those days.  Med list updated. Patient aware to call us in the next week or so with BP numbers.  Routing to Dr Rockey Situ to clarify if repeat BMET is needed.

## 2020-01-25 NOTE — Telephone Encounter (Signed)
-----   Message from Minna Merritts, MD sent at 01/25/2020  3:57 PM EDT ----- Lab work reviewed Liver numbers looking somewhat better Renal function worse likely from dehydration Would stop torsemide over the weekend Starting next week torsemide 20 mg Monday Wednesday Friday with 1 potassium on those days  Can he call us in the next week or 2 with some home blood pressure measurements Additional medication changes may be needed as blood pressure was elevated on his evaluation

## 2020-01-26 NOTE — Telephone Encounter (Signed)
Repeat BMP 2 to 3 weeks

## 2020-01-31 NOTE — Telephone Encounter (Signed)
Labs scheduled  

## 2020-02-19 ENCOUNTER — Other Ambulatory Visit (INDEPENDENT_AMBULATORY_CARE_PROVIDER_SITE_OTHER): Payer: Self-pay

## 2020-02-19 ENCOUNTER — Other Ambulatory Visit: Payer: Self-pay

## 2020-02-19 DIAGNOSIS — I480 Paroxysmal atrial fibrillation: Secondary | ICD-10-CM

## 2020-02-20 ENCOUNTER — Other Ambulatory Visit: Payer: Self-pay | Admitting: *Deleted

## 2020-02-20 LAB — BASIC METABOLIC PANEL
BUN/Creatinine Ratio: 26 — ABNORMAL HIGH (ref 9–20)
BUN: 51 mg/dL — ABNORMAL HIGH (ref 6–24)
CO2: 19 mmol/L — ABNORMAL LOW (ref 20–29)
Calcium: 8.3 mg/dL — ABNORMAL LOW (ref 8.7–10.2)
Chloride: 112 mmol/L — ABNORMAL HIGH (ref 96–106)
Creatinine, Ser: 1.93 mg/dL — ABNORMAL HIGH (ref 0.76–1.27)
GFR calc Af Amer: 43 mL/min/{1.73_m2} — ABNORMAL LOW (ref >59)
GFR calc non Af Amer: 38 mL/min/{1.73_m2} — ABNORMAL LOW (ref >59)
Glucose: 150 mg/dL — ABNORMAL HIGH (ref 65–99)
Potassium: 4.7 mmol/L (ref 3.5–5.2)
Sodium: 143 mmol/L (ref 134–144)

## 2020-02-20 MED ORDER — TORSEMIDE 20 MG PO TABS: 20.0000 mg | ORAL_TABLET | ORAL | 1 refills | Status: DC

## 2020-03-03 ENCOUNTER — Telehealth: Payer: Self-pay

## 2020-03-03 NOTE — Telephone Encounter (Signed)
Call to patient to review labs.    Pt verbalized understanding and has no further questions at this time.    Advised pt to call for any further questions or concerns.  No further orders.  Pt has follow up 11/21

## 2020-03-03 NOTE — Telephone Encounter (Signed)
-----   Message from Minna Merritts, MD sent at 03/02/2020  6:01 PM EDT ----- Reviewed Vita Barley relatively stable, slightly improved renal function Would stay on current regimen

## 2020-04-12 IMAGING — US US ABDOMEN COMPLETE
1 series · 13 of 25 positions shown · non-contrast
Comparison: Renal ultrasound June 27, 2019

CLINICAL DATA: Elevated liver enzymes

EXAM:
ABDOMEN ULTRASOUND COMPLETE

[Series 1: us abdomen complete · 0.25mm/px · 13 of 107 slices shown]
[im 1/107]
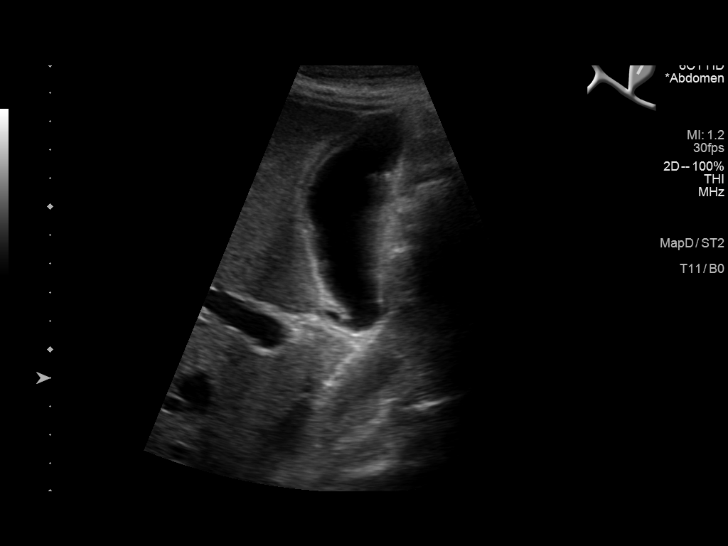
[im 9/107]
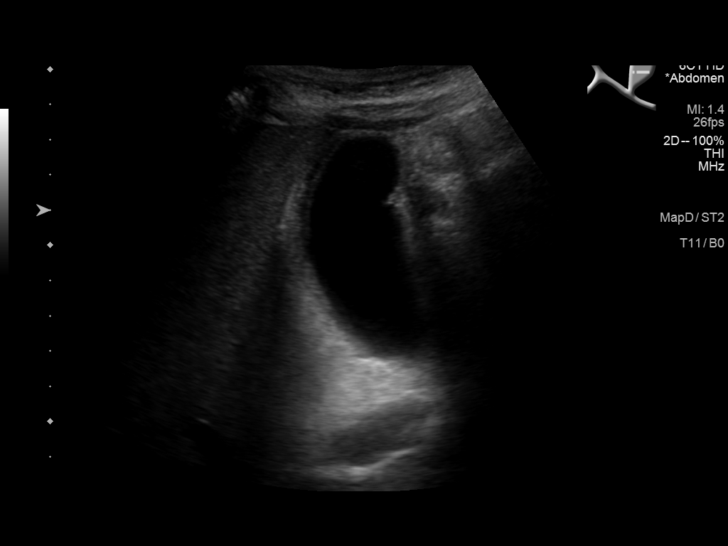
[im 18/107]
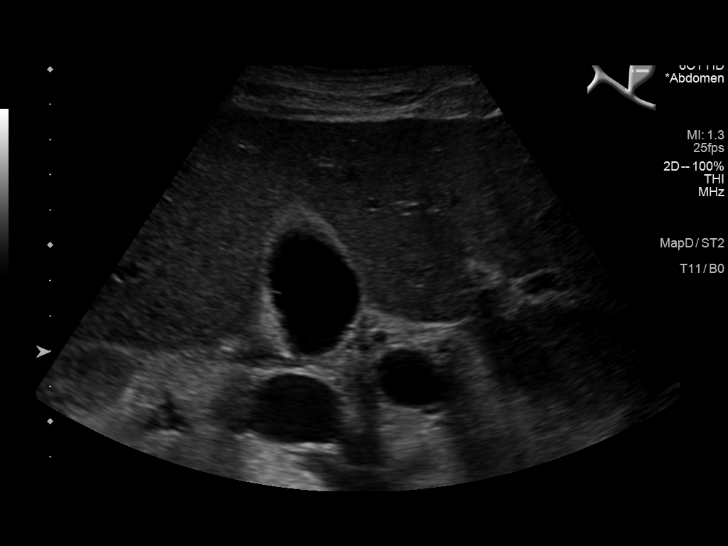
[im 27/107]
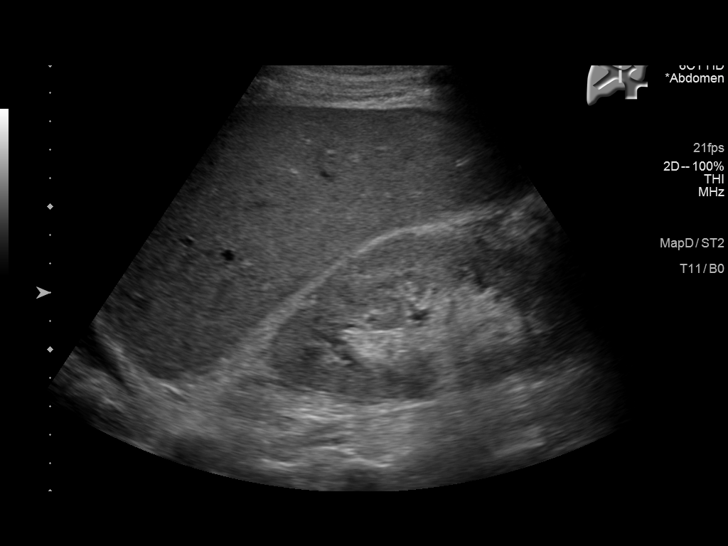
[im 36/107]
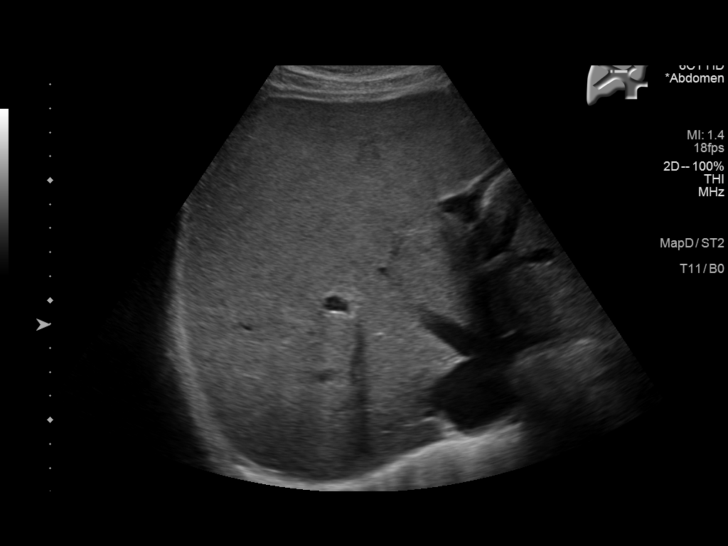
[im 45/107]
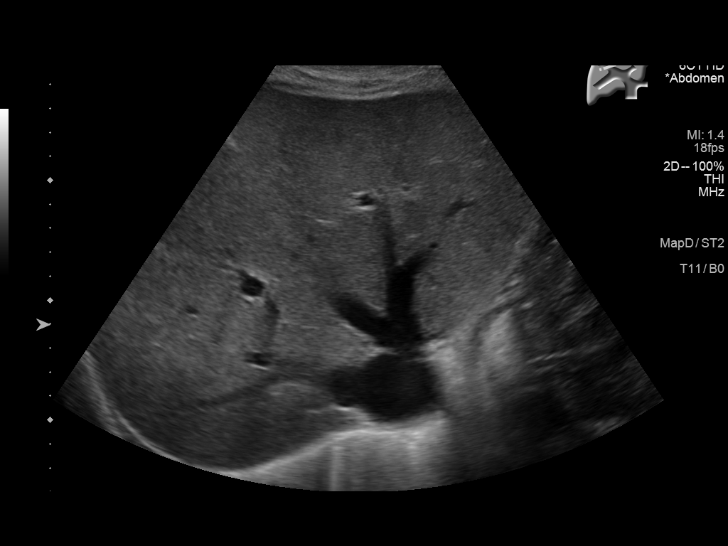
[im 54/107]
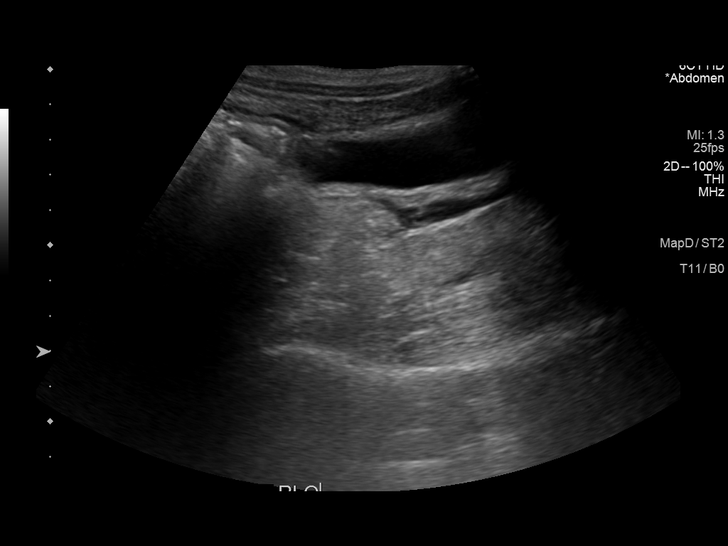
[im 62/107]
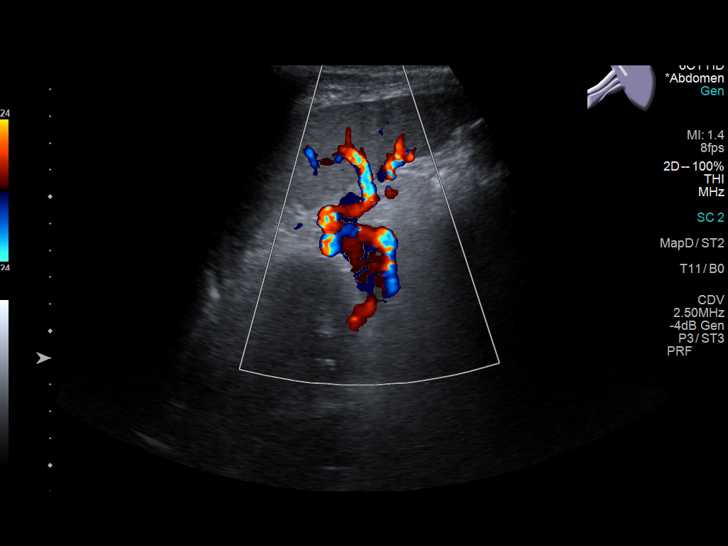
[im 71/107]
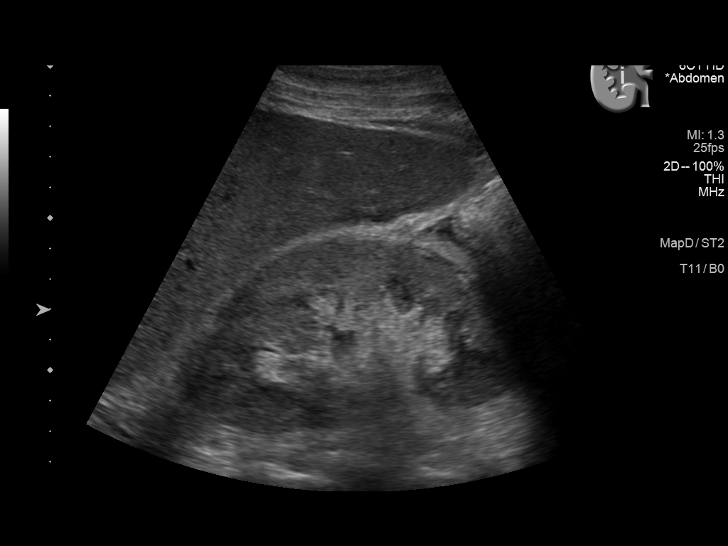
[im 80/107]
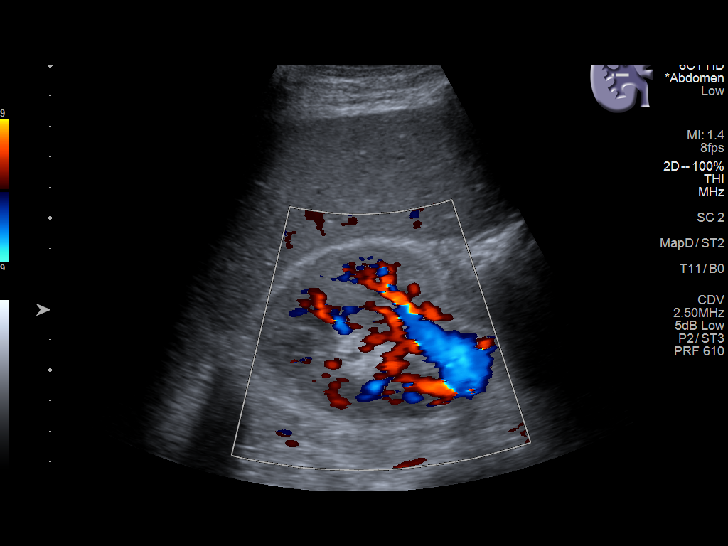
[im 89/107]
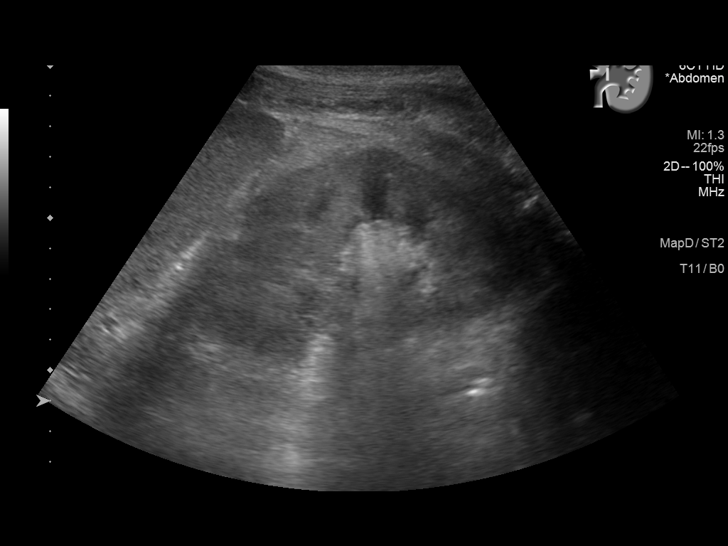
[im 98/107]
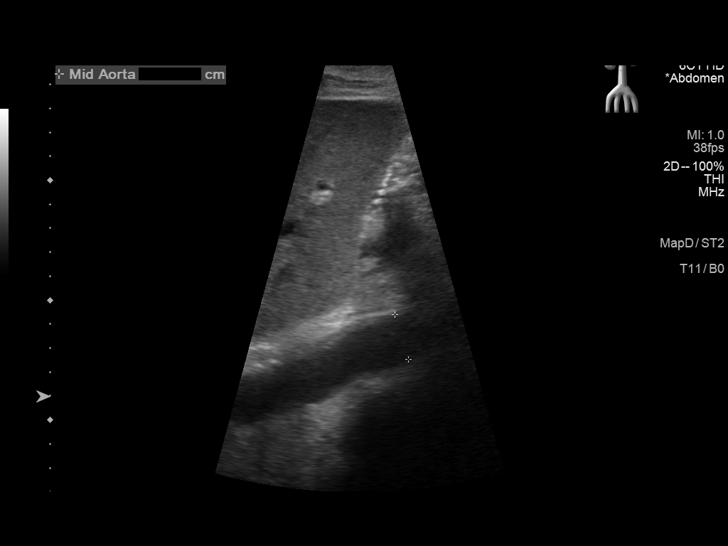
[im 107/107]
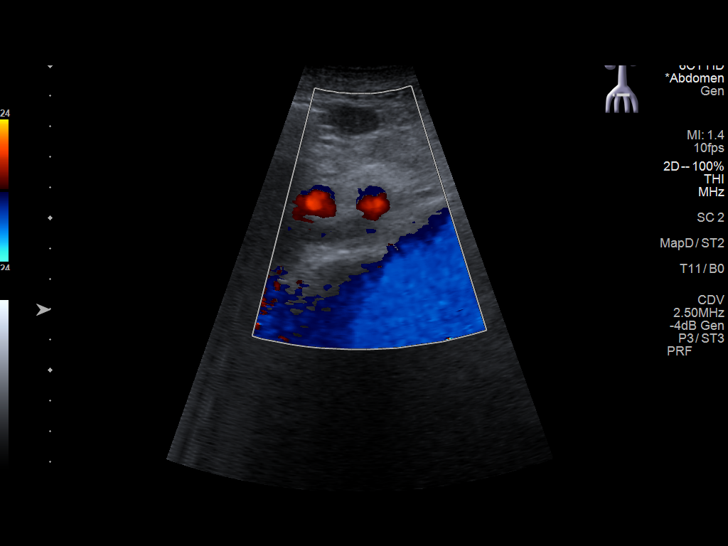

[13 of 25 positions shown; findings below may reference images not displayed]

FINDINGS: Gallbladder: No gallstones. Gallbladder wall is mildly thickened.
There is questionable mild pericholecystic fluid versus ascites
adjacent to the gallbladder. No sonographic Murphy sign noted by
sonographer.

Common bile duct: Diameter: 4 mm. No intrahepatic, common hepatic,
or common bile duct dilatation.

Liver: No focal lesion identified. Within normal limits in
parenchymal echogenicity. Portal vein is patent on color Doppler
imaging with normal direction of blood flow towards the liver.

IVC: No abnormality visualized.

Pancreas: No pancreatic mass or inflammatory focus.

Spleen: Size and appearance within normal limits.

Right Kidney: Length: 11.6 cm. Echogenicity is increased. Renal
cortical thickness is normal. No mass or hydronephrosis visualized.

Left Kidney: Length: 12.2 cm. Echogenicity is increased. Renal
cortical thickness is normal. No mass or hydronephrosis visualized.

Abdominal aorta: No aneurysm visualized.

Other findings: There is mild ascites. There are bilateral pleural
effusions, larger on the right than on the left.
IMPRESSION: 1. No gallstones evident. Gallbladder wall mildly thickened with
questionable ascites versus pericholecystic fluid in the gallbladder
region. Ascites may cause gallbladder wall thickening. The
possibility of a degree of acalculus cholecystitis in this
circumstance cannot be excluded. This finding may warrant nuclear
medicine hepatobiliary imaging study to further evaluate.

2. Mild ascites noted. Bilateral pleural effusions, larger on the
right than on the left.

3.  No evident biliary duct dilatation.

4. Increase in renal echogenicity, a finding indicative of medical
renal disease.

These results will be called to the ordering clinician or
representative by the Radiologist Assistant, and communication
documented in the PACS or zVision Dashboard.

## 2020-05-12 ENCOUNTER — Telehealth: Payer: Self-pay | Admitting: Cardiovascular Disease

## 2020-05-12 DIAGNOSIS — I480 Paroxysmal atrial fibrillation: Secondary | ICD-10-CM

## 2020-05-12 NOTE — Telephone Encounter (Signed)
Patient states he missed a call from this number, please advise when able

## 2020-05-12 NOTE — Telephone Encounter (Signed)
Looks like he's been taking torsemide 20mg  on MWF since June.  Renal fxn relatively stable at July [facility] visit.  Take torsemide 20mg  BID x 3 days and then reduce to 20mg  daily w/ f/u bmet in 1 wk and office visit.

## 2020-05-12 NOTE — Telephone Encounter (Signed)
Patient states Dr. Rockey Situ decreased his Torsemide and since then he has noticed swelling  Are you currently SOB?no  Pt c/o swelling: STAT is pt has developed SOB within 24 hours  How much weight have you gained and in what time span?  Has gained weight, but not sure how much If swelling, where is the swelling located?  Bilateral legs  Are you currently taking a fluid pill?  Yes  Have you traveled recently? no

## 2020-05-12 NOTE — Telephone Encounter (Signed)
Spoke with patient and he states that his lower leg extremity is still a problem. He also reports weight gain around the 160's range. He does not have any exact weights. Previously on 2 tablets once daily for 7 days a week and now that he is only taking 1 tablet every M-W-F he now has seen increase in fluid retention. Advised that I would send over to provider for review and further recommendations. He verbalized understanding with no further questions at this time.

## 2020-05-13 NOTE — Telephone Encounter (Signed)
Spoke with patient and reviewed provider recommendations. He repeated all information back to me and verbalized understanding of all instructions. Lab order entered and appointment also scheduled and confirmed with him. He had no further questions or concerns at this time.

## 2020-05-13 NOTE — Telephone Encounter (Signed)
Left voicemail message to call back for recommendations.

## 2020-05-21 ENCOUNTER — Telehealth: Payer: Self-pay

## 2020-05-21 ENCOUNTER — Other Ambulatory Visit: Payer: Self-pay

## 2020-05-21 ENCOUNTER — Other Ambulatory Visit
Admission: RE | Admit: 2020-05-21 | Discharge: 2020-05-21 | Disposition: A | Discharge status: Home / Self Care | Payer: Self-pay | Attending: Nurse Practitioner | Admitting: Nurse Practitioner

## 2020-05-21 DIAGNOSIS — I480 Paroxysmal atrial fibrillation: Secondary | ICD-10-CM | POA: Insufficient documentation

## 2020-05-21 LAB — BASIC METABOLIC PANEL
Anion gap: 7 (ref 5–15)
BUN: 58 mg/dL — ABNORMAL HIGH (ref 6–20)
CO2: 23 mmol/L (ref 22–32)
Calcium: 8.3 mg/dL — ABNORMAL LOW (ref 8.9–10.3)
Chloride: 115 mmol/L — ABNORMAL HIGH (ref 98–111)
Creatinine, Ser: 2.26 mg/dL — ABNORMAL HIGH (ref 0.61–1.24)
GFR calc Af Amer: 36 mL/min — ABNORMAL LOW (ref >60)
GFR calc non Af Amer: 31 mL/min — ABNORMAL LOW (ref >60)
Glucose, Bld: 172 mg/dL — ABNORMAL HIGH (ref 70–99)
Potassium: 4.8 mmol/L (ref 3.5–5.1)
Sodium: 145 mmol/L (ref 135–145)

## 2020-05-21 MED ORDER — TORSEMIDE 20 MG PO TABS: 10.0000 mg | ORAL_TABLET | Freq: Every day | ORAL | 1 refills | Status: DC

## 2020-05-21 NOTE — Telephone Encounter (Signed)
-----   Message from Theora Gianotti, NP sent at 05/21/2020 10:16 AM EDT ----- Kidney's a little more dry on torsemide 20mg  daily.  Please reduce to 10mg  daily.  We can discuss further at f/u 9/3.

## 2020-05-21 NOTE — Telephone Encounter (Signed)
Call to patient to review labs.    Pt verbalized understanding. pt expressed frustration with lowering fluid pill dose as he already feels current dose is not working. I suggested he speak to provider about it during [place] on Friday.   Advised pt to call for any further questions or concerns.  rx updated as advised.   Provider updated verbally.

## 2020-05-23 ENCOUNTER — Other Ambulatory Visit: Payer: Self-pay

## 2020-05-23 ENCOUNTER — Other Ambulatory Visit
Admission: RE | Admit: 2020-05-23 | Discharge: 2020-05-23 | Disposition: A | Discharge status: Home / Self Care | Payer: Self-pay | Attending: Nurse Practitioner | Admitting: Nurse Practitioner

## 2020-05-23 ENCOUNTER — Ambulatory Visit (INDEPENDENT_AMBULATORY_CARE_PROVIDER_SITE_OTHER): Payer: Self-pay | Admitting: Nurse Practitioner

## 2020-05-23 ENCOUNTER — Encounter: Payer: Self-pay | Admitting: Nurse Practitioner

## 2020-05-23 VITALS — BP 170/90 | HR 65 | Ht 69.0 in | Wt 167.2 lb

## 2020-05-23 DIAGNOSIS — R079 Chest pain, unspecified: Secondary | ICD-10-CM

## 2020-05-23 DIAGNOSIS — I480 Paroxysmal atrial fibrillation: Secondary | ICD-10-CM

## 2020-05-23 DIAGNOSIS — I2510 Atherosclerotic heart disease of native coronary artery without angina pectoris: Secondary | ICD-10-CM

## 2020-05-23 DIAGNOSIS — N183 Chronic kidney disease, stage 3 unspecified: Secondary | ICD-10-CM

## 2020-05-23 DIAGNOSIS — I10 Essential (primary) hypertension: Secondary | ICD-10-CM

## 2020-05-23 DIAGNOSIS — I5043 Acute on chronic combined systolic (congestive) and diastolic (congestive) heart failure: Secondary | ICD-10-CM

## 2020-05-23 LAB — BASIC METABOLIC PANEL
Anion gap: 8 (ref 5–15)
BUN: 59 mg/dL — ABNORMAL HIGH (ref 6–20)
CO2: 23 mmol/L (ref 22–32)
Calcium: 8.2 mg/dL — ABNORMAL LOW (ref 8.9–10.3)
Chloride: 112 mmol/L — ABNORMAL HIGH (ref 98–111)
Creatinine, Ser: 2.27 mg/dL — ABNORMAL HIGH (ref 0.61–1.24)
GFR calc Af Amer: 36 mL/min — ABNORMAL LOW (ref >60)
GFR calc non Af Amer: 31 mL/min — ABNORMAL LOW (ref >60)
Glucose, Bld: 199 mg/dL — ABNORMAL HIGH (ref 70–99)
Potassium: 4.5 mmol/L (ref 3.5–5.1)
Sodium: 143 mmol/L (ref 135–145)

## 2020-05-23 MED ORDER — HYDRALAZINE HCL 25 MG PO TABS: 25.0000 mg | ORAL_TABLET | Freq: Three times a day (TID) | ORAL | 3 refills | Status: DC

## 2020-05-23 MED ORDER — METOLAZONE 2.5 MG PO TABS: ORAL_TABLET | ORAL | 3 refills | Status: DC

## 2020-05-23 NOTE — Patient Instructions (Addendum)
Medication Instructions:  1- START Hydralazine Take 1 tablet (25 mg total) by mouth 3 (three) times daily 2- START Metolazone Take 1 tablet (2.5 mg total) 30 mins prior to Lasix dose on Monday and fridays only *If you need a refill on your cardiac medications before your next appointment, please call your pharmacy*   Lab Work: 1- Your physician recommends that you return for lab work TODAY [facility]. (BM[facility])  No appt is needed. Hours are M-F 7AM- 6 PM.  2- Your physician recommends that you return for lab work on 9/10  [facility]. ([facility]) No appt is needed. Hours are M-F 7AM- 6 PM.  If you have labs (blood work) drawn today and your tests are completely normal, you will receive your results only by: Marland Kitchen MyChart Message (if you have MyChart) OR . A paper copy in the mail If you have any lab test that is abnormal or we need to change your treatment, we will call you to review the results.   Testing/Procedures:none ordered   Follow-Up: At [facility], you and your health needs are our priority.  As part of our continuing mission to provide you with exceptional heart care, we have created designated Provider Care Teams.  These Care Teams include your primary Cardiologist (physician) and Advanced Practice Providers (APPs -  Physician Assistants and Nurse Practitioners) who all work together to provide you with the care you need, when you need it.  We recommend signing up for the patient portal called "MyChart".  Sign up information is provided on this After Visit Summary.  MyChart is used to connect with patients for Virtual Visits (Telemedicine).  Patients are able to view lab/test results, encounter notes, upcoming appointments, etc.  Non-urgent messages can be sent to your provider as well.   To learn more about what you can do with MyChart, go to [email or web address].    Your next appointment:   10-14day(s)  The format for your next appointment:   In  Person  Provider:    You may see Ida Rogue, MD or Murray Hodgkins, NP  (Can put in afternoon hospital day with Sharolyn Douglas if needed, just let me know)

## 2020-05-23 NOTE — Addendum Note (Signed)
Addended by: Raelene Bott, Saamir Armstrong L on: 05/23/2020 01:15 PM   Modules accepted: Orders

## 2020-05-23 NOTE — Progress Notes (Signed)
Office Visit    Patient Name: Nathan [name] Date of Encounter: 05/23/2020  Primary Care Provider:  Derinda Late, MD Primary Cardiologist:  Ida Rogue, MD  Chief Complaint    59 year old male with a history of heart failure with midrange ejection fraction/chronic combined systolic diastolic congestive heart failure, cardiomyopathy of uncertain etiology, paroxysmal atrial fibrillation, left hydropneumothorax status post VATS in October 2020, insulin-dependent diabetes mellitus, stage III chronic kidney disease, MRSA bacteremia, anemia of chronic disease, hypertension, tobacco abuse, and hypokalemia, who presents for follow-up related to heart failure.  Past Medical History    Past Medical History:  Diagnosis Date  . Anemia of chronic disease   . Chronic combined systolic (congestive) and diastolic (congestive) heart failure ([place])    a. 05/2019 Echo: EF 45-50%, nl LA/RA size. Mild to mod TR.  . CKD (chronic kidney disease), stage III   . Diabetes mellitus without complication ([place])   . Hypertension   . Left Hydropneumothorax/Fibrinopurulent empyema    a. 06/2019 s/p VATS.  Marland Kitchen PAF (paroxysmal atrial fibrillation) ([place])    a. 05/2019 in setting of sepsis-->s/p TEE/DCCV; b. CHA2DS2VASc = 2-->Eliquis.  . Sepsis ([employer])    a. 05/2019 MRSA in setting of infected R thumb.   Past Surgical History:  Procedure Laterality Date  . HAND SURGERY Right   . I & D EXTREMITY Right 06/11/2019   Procedure: IRRIGATION AND DEBRIDEMENT RIGHT THUMB;  Surgeon: Dereck Leep, MD;  Location: [facility];  Service: Orthopedics;  Laterality: Right;  . TEE WITHOUT CARDIOVERSION N/A 06/14/2019   Procedure: TRANSESOPHAGEAL ECHOCARDIOGRAM (TEE);  Surgeon: Minna Merritts, MD;  Location: [facility];  Service: Cardiovascular;  Laterality: N/A;  . VIDEO ASSISTED THORACOSCOPY (VATS)/THOROCOTOMY Left 06/26/2019   Procedure: VIDEO ASSISTED THORACOSCOPY DECORTICATION;  Surgeon: Lajuana Matte, MD;  Location:  [place];  Service: Thoracic;  Laterality: Left;  Marland Kitchen VIDEO BRONCHOSCOPY N/A 06/26/2019   Procedure: VIDEO BRONCHOSCOPY;  Surgeon: Lajuana Matte, MD;  [facility];  Service: Thoracic;  Laterality: N/A;    Allergies  No Known Allergies  History of Present Illness    59 year old male with a history of heart failure with midrange ejection fraction/chronic combined systolic diastolic congestive heart failure, cardiomyopathy of uncertain etiology, paroxysmal atrial fibrillation (CHA2DS2VASc equals 4), left hydropneumothorax status post VATS in October 2020, insulin-dependent diabetes mellitus, stage III chronic kidney disease, MRSA bacteremia, anemia of chronic disease, hypertension, tobacco abuse, and hypokalemia.  Atrial fibrillation history dates back to early 2000 and he was initially managed with aspirin, though this was subsequently discontinued secondary to headaches.  He was lost to follow-up until September 2020, he was admitted with right hand cellulitis requiring I&D.  This was complicated by MRSA bacteremia.  Transthoracic echocardiogram during admission showed an EF of 45-50% with mild regurgitation, and mild to moderate tricuspid regurgitation.  This was followed by transesophageal echocardiogram which did not show any evidence of valvular vegetation.  EF by TEE was 50-55%.  Following discharge, he had worsening bilateral lower extremity swelling and anasarca with dyspnea.  He required readmission and aggressive diuresis.  CT of his chest during admission on June 21, 2019 showed left-sided empyema with bilateral lower lobe consolidation and groundglass opacities consistent with infectious process and likely reactive mediastinal and hilar adenopathy.  He did require chest tube placement in the setting of left hydropneumothorax and he was subsequently treated with VATS on June 26, 2019.  He was readmitted in December 2020 in the setting of noncompliance with  medications and volume  overload.  He had ongoing lower extremity edema at post hospital follow-up and metolazone was added with significant improvement in edema and weight.  Patient subsequently stopped metolazone in early 2021.    At office follow-up on May 3, he was doing reasonably well though blood pressure was elevated.  Since then, there have been adjustments in his torsemide dosing in the setting of acute on chronic kidney disease.  He had been taking torsemide 20 mg on Mondays, Wednesdays, and Fridays and renal function was stable at 2.0 at primary care visit in July.  He contacted our office on August 23 and reported increasing lower extremity edema.  Torsemide was increased to 20 mg twice daily x3 days then reduce to 20 mg daily.  Follow-up basic metabolic panel on September 1 showed rise in BUN and creatinine to 58/2.26 and recommendation was made to reduce torsemide back to 10 mg daily with office follow-up today.  He says that in retrospect, he has been having increasing lower extremity swelling dating back to June or so.  His legs have been very tight, all the way up to his hips.  When he took torsemide 20 mg twice daily x3 days recently, he noted significant urine output with reduced edema but once he dropped back down to 20 mg daily, his legs remained stable without any additional diuresis.  He does remember having a good response to metolazone previously.  He does not weigh himself at home but since April, his weight is up 26 pounds.  In this setting, he has had some increasing dyspnea on exertion and occasionally notes tightness in his chest with higher levels of activity such as walking up steep inclines or stairs.  He works in [employer] and does not typically experience limitations with his usual work.  He denies PND, orthopnea, palpitations, dizziness, syncope, or early satiety.  He does continue to eat a fair amount of processed foods at home.  Home Medications    Prior to Admission medications    Medication Sig Start Date End Date Taking? Authorizing Provider  apixaban (ELIQUIS) 5 MG TABS tablet Take 1 tablet (5 mg total) by mouth 2 (two) times daily. 09/03/19   Dunn, Areta Haber, PA-C  carvedilol (COREG) 25 MG tablet Take 1 tablet (25 mg total) by mouth 2 (two) times daily. 09/19/19 01/21/20  Verta Ellen., NP  ferrous sulfate 325 (65 FE) MG tablet Take 1 tablet (325 mg total) by mouth 2 (two) times daily with a meal. Patient taking differently: Take 325 mg by mouth daily with breakfast.  06/30/19   Regalado, Belkys A, MD  insulin NPH-regular Human (70-30) 100 UNIT/ML injection Inject 5 Units into the skin daily.    [provider]  isosorbide mononitrate (IMDUR) 30 MG 24 hr tablet Take 1 tablet (30 mg total) by mouth daily. 10/22/19   Minna Merritts, MD  Multiple Vitamin (MULTIVITAMIN) tablet Take 1 tablet by mouth daily.    [provider]  Potassium Chloride ER 20 MEQ TBCR Take 20 mEq by mouth every Monday, Wednesday, and Friday. 01/25/20 04/24/20  Minna Merritts, MD  torsemide (DEMADEX) 20 MG tablet Take 0.5 tablets (10 mg total) by mouth daily. 05/21/20   Theora Gianotti, NP    Review of Systems    Increasing lower extremity edema over the past several months as outlined above.  Increased dyspnea exertion.  Occasional exertional chest tightness which has not really changed in frequency over  the past year.  He denies palpitations, PND, orthopnea, dizziness, syncope, or early satiety.  All other systems reviewed and are otherwise negative except as noted above.  Physical Exam    VS:  BP (!) 170/90 (BP Location: Left Arm, Patient Position: Sitting, Cuff Size: Normal)   Pulse 65   Ht 5\' 9"  (1.753 m)   Wt 167 lb 4 oz (75.9 kg)   SpO2 99%   BMI 24.70 kg/m  , BMI Body mass index is 24.7 kg/m. GEN: Well nourished, well developed, in no acute distress. HEENT: normal. Neck: Supple, moderately elevated JVP.  No carotid bruits, or masses. Cardiac: RRR, no  murmurs, rubs, or gallops. No clubbing, cyanosis.  3+ bilateral lower extremity edema to the mid thighs.  Radials/PT 2+ and equal bilaterally.  Respiratory:  Respirations regular and unlabored, basilar crackles on the left, otherwise clear to auscultation. GI: Soft, nontender, nondistended, BS + x 4. MS: no deformity or atrophy. Skin: warm and dry, no rash. Neuro:  Strength and sensation are intact. Psych: Normal affect.  Accessory Clinical Findings    ECG personally reviewed by me today -regular sinus rhythm, 65 - no acute changes.  Lab Results  Component Value Date   WBC 7.9 10/22/2019   HGB 9.1 (L) 10/22/2019   HCT 29.1 (L) 10/22/2019   MCV 93 10/22/2019   PLT 408 10/22/2019   Lab Results  Component Value Date   CREATININE 2.26 (H) 05/21/2020   BUN 58 (H) 05/21/2020   NA 145 05/21/2020   K 4.8 05/21/2020   CL 115 (H) 05/21/2020   CO2 23 05/21/2020   Lab Results  Component Value Date   ALT 28 01/21/2020   AST 55 (H) 01/21/2020   GGT 264 (H) 01/21/2020   ALKPHOS 172 (H) 01/21/2020   BILITOT <0.2 01/21/2020   Lab Results  Component Value Date   CHOL 138 06/26/2019   HDL 39 (L) 06/26/2019   LDLCALC 75 06/26/2019   TRIG 119 06/26/2019   CHOLHDL 3.5 06/26/2019    Lab Results  Component Value Date   HGBA1C 6.9 (H) 01/21/2020    Assessment & Plan    1.  Acute on chronic combined systolic and diastolic congestive heart failure/cardiomyopathy of uncertain etiology: EF 45-50% by echo in September 2020 (50-55% by TEE).  He has had prior hospitalizations related to worsening lower extremity edema and heart failure but has been managed as an outpatient since last winter.  He did well with metolazone previously but he stopped this earlier this year.  On torsemide 40 daily and subsequently 20 daily, he had a rising creatinine resulting in a drop of torsemide to 40 mg on Mondays, Wednesdays, and Fridays beginning this past May.  He noted increasing lower extremity swelling over  the summer and on August 23, he called the office and we increase his torsemide to 20 mg twice daily x3 days and then 20 mg daily.  He did have a response to twice daily dosing which waned on daily dosing.  We have since had to reduce his torsemide to 10 mg daily in the setting of a rising creatinine to 2.26 on September 1, up from 2.0 in July.  He is volume overloaded on examination today with significant bilateral lower extremity edema to his thighs.  I will follow-up a basic metabolic panel today.  I am adding metolazone 2.5 mg on Mondays and Fridays (first dose today) with plan for follow-up basic metabolic panel next Friday.  I will also  add hydralazine 25mg  TID for BP mgmt and afterload reduction.  I will plan to see him back in clinic in approximately 2 weeks.  If either he does not have significant response to metolazone or worsening of renal failure, he will require admission for IV diuresis, close monitoring of creatinine, and nephrology consultation.  Regarding etiology of cardiomyopathy, he has never undergone ischemic testing in the setting of lack of insurance.  He is not interested in pursuing stress testing at this time.  We discussed the importance of daily weights, sodium restriction, medication compliance, and symptom reporting and he verbalizes understanding.   2.  Exertional chest pain/coronary artery calcifications on CT: In the setting of increasing lower extremity swelling, he has had increasing dyspnea on exertion and occasionally notes exertional chest discomfort.  This typically only occurs at higher levels of activity.  He has had similar in the past and for this reason, he is on Imdur.  As outlined above, he has never undergone ischemic testing to assess etiology of cardiomyopathy.  Three-vessel coronary calcifications were previously noted on CT in October 2020.  Continue beta-blocker and nitrate therapy.  No aspirin in the setting of chronic Eliquis.  No statin in the setting of  chronically elevated LFTs.  As he is self-pay, he is not currently history and ischemic testing.    3.  Essential hypertension: This is poorly controlled.  He is 170/90 today, and he says this is common at home.  We cannot use ACE/ARB/ARNI in the setting of stage III chronic kidney disease.  Avoiding amlodipine in the setting of chronic lower extremity edema.  We will add hydralazine 25 mg 3 times daily.  Continue carvedilol.  4.  Paroxysmal atrial fibrillation: Denies palpitations.  Maintaining sinus rhythm.  Continue beta-blocker and Eliquis.  5.  Stage III chronic kidney disease: Creatinine variable based on diuretic dosing as outlined above.  Follow-up basic metabolic panel today with plan for repeat in 1 week given diuretic changes as outlined above.  He has never seen nephrology as an outpatient and we will discuss outpatient referral at follow-up visit.  He is somewhat reluctant in the setting of self-pay.  6.  Type 2 diabetes mellitus: A1c was 6.9 in May.  He is on low-dose Metformin-500 mg daily in the setting of chronic kidney disease.  7.  Disposition: Follow-up basic metabolic panel today and again in 1 week.  Follow-up in clinic in 2 weeks or sooner if necessary.  As noted above, if renal failure worsens or there is poor response to diuretics, he will require admission for IV diuresis and inpatient nephrology consultation.  Murray Hodgkins, NP 05/23/2020, 8:54 AM

## 2020-05-30 ENCOUNTER — Telehealth: Payer: Self-pay

## 2020-05-30 ENCOUNTER — Other Ambulatory Visit
Admission: RE | Admit: 2020-05-30 | Discharge: 2020-05-30 | Disposition: A | Discharge status: Home / Self Care | Payer: Self-pay | Attending: Nurse Practitioner | Admitting: Nurse Practitioner

## 2020-05-30 ENCOUNTER — Other Ambulatory Visit: Payer: Self-pay

## 2020-05-30 DIAGNOSIS — I480 Paroxysmal atrial fibrillation: Secondary | ICD-10-CM | POA: Insufficient documentation

## 2020-05-30 LAB — BASIC METABOLIC PANEL
Anion gap: 9 (ref 5–15)
BUN: 64 mg/dL — ABNORMAL HIGH (ref 6–20)
CO2: 21 mmol/L — ABNORMAL LOW (ref 22–32)
Calcium: 8.3 mg/dL — ABNORMAL LOW (ref 8.9–10.3)
Chloride: 111 mmol/L (ref 98–111)
Creatinine, Ser: 2.49 mg/dL — ABNORMAL HIGH (ref 0.61–1.24)
GFR calc Af Amer: 32 mL/min — ABNORMAL LOW (ref >60)
GFR calc non Af Amer: 28 mL/min — ABNORMAL LOW (ref >60)
Glucose, Bld: 182 mg/dL — ABNORMAL HIGH (ref 70–99)
Potassium: 4.6 mmol/L (ref 3.5–5.1)
Sodium: 141 mmol/L (ref 135–145)

## 2020-05-30 MED ORDER — METOLAZONE 2.5 MG PO TABS: ORAL_TABLET | ORAL | 3 refills | Status: DC

## 2020-05-30 NOTE — Telephone Encounter (Signed)
Call to patient to review labs.    Pt verbalized understanding and is agreeable to POC.  Made appt for Tuesday afternoon with primary cardiologist. Pt reports leg swelling is the same.  Discussed with patient if he begins having SOBOE and excess weight gain to seek urgent medical care. He denies SOB at this time and does not want to go to hospital.   Advised pt to call for any further questions or concerns.  rx updated as advised.

## 2020-05-30 NOTE — Telephone Encounter (Signed)
-----   Message from Theora Gianotti, NP sent at 05/30/2020 10:22 AM EDT ----- Nathan [name] stable but kidneys sl worse. Cont torsemide 10mg  daily and reduce metolazone to 2.5mg  once weekly. He is not scheduled to f/u until 9/24. If he's willing, pls add him on to see me one afternoon next week. I'm rounding, but am happy to see him around 2p on W, Th, or Fri. If he has not responded to the addition of metolazone thus far, he will be best served with admission.

## 2020-06-03 ENCOUNTER — Other Ambulatory Visit: Payer: Self-pay

## 2020-06-03 ENCOUNTER — Encounter: Payer: Self-pay | Admitting: Cardiovascular Disease

## 2020-06-03 ENCOUNTER — Ambulatory Visit (INDEPENDENT_AMBULATORY_CARE_PROVIDER_SITE_OTHER): Payer: Self-pay | Admitting: Cardiovascular Disease

## 2020-06-03 VITALS — BP 160/74 | HR 77 | Ht 69.0 in | Wt 172.2 lb

## 2020-06-03 DIAGNOSIS — R079 Chest pain, unspecified: Secondary | ICD-10-CM

## 2020-06-03 DIAGNOSIS — I5043 Acute on chronic combined systolic (congestive) and diastolic (congestive) heart failure: Secondary | ICD-10-CM

## 2020-06-03 DIAGNOSIS — N183 Chronic kidney disease, stage 3 unspecified: Secondary | ICD-10-CM

## 2020-06-03 DIAGNOSIS — I429 Cardiomyopathy, unspecified: Secondary | ICD-10-CM

## 2020-06-03 DIAGNOSIS — I2510 Atherosclerotic heart disease of native coronary artery without angina pectoris: Secondary | ICD-10-CM

## 2020-06-03 DIAGNOSIS — I10 Essential (primary) hypertension: Secondary | ICD-10-CM

## 2020-06-03 DIAGNOSIS — I480 Paroxysmal atrial fibrillation: Secondary | ICD-10-CM

## 2020-06-03 MED ORDER — METOLAZONE 2.5 MG PO TABS: ORAL_TABLET | ORAL | 3 refills | Status: DC

## 2020-06-03 MED ORDER — TORSEMIDE 20 MG PO TABS
40.0000 mg | ORAL_TABLET | Freq: Every day | ORAL | 1 refills | Status: DC
Start: 2020-06-03 — End: 2020-06-06

## 2020-06-03 NOTE — Patient Instructions (Addendum)
Medication Instructions:  Talk with Dr. Loney Hering about stopping the metformin  Start torsemide 40 mg daily Metolazone 3 days a week for weight > 150 - 155 pounds   If you need a refill on your cardiac medications before your next appointment, please call your pharmacy.    Lab work: No new labs needed   If you have labs (blood work) drawn today and your tests are completely normal, you will receive your results only by:  [place] (if you have MyChart) OR  A paper copy in the mail If you have any lab test that is abnormal or we need to change your treatment, we will call you to review the results.   Testing/Procedures: No new testing needed   Follow-Up: At [facility], you and your health needs are our priority.  As part of our continuing mission to provide you with exceptional heart care, we have created designated Provider Care Teams.  These Care Teams include your primary Cardiologist (physician) and Advanced Practice Providers (APPs -  Physician Assistants and Nurse Practitioners) who all work together to provide you with the care you need, when you need it.   You will need a follow up appointment in 1 month with Kikue Gerhart or APP   Providers on your designated Care Team:    Murray Hodgkins, NP  Christell Faith, PA-C  Marrianne Mood, PA-C  Any Other Special Instructions Will Be Listed Below (If Applicable).  COVID-19 Vaccine Information can be found at: [email or web address].uk For questions related to vaccine distribution or appointments, please email [email or web address] or call [phone])217-7328.

## 2020-06-03 NOTE — Progress Notes (Signed)
Cardiology Office Note  Date:  06/03/2020   ID:  Nathan [name], DOB 1961-04-27, MRN [id number]  PCP:  Derinda Late, MD   Chief Complaint  Patient presents with  . OTHER    Edema/Sob. Meds reviewed verbally with pt.    HPI:  Nathan [name] is a 59 y.o. male with past medical history of chronic combined systolic/diastolic heart failure,  cardiomyopathy (ischemic versus nonischemic),  paroxysmal atrial fib.,  left hydropneumothorax status post VATS October 2020, insulin-dependent diabetes chronic kidney disease stage III,  MRSA bacteremia,  anemia of chronic disease,  hypertension,  smoking,   Left ventricular ejection fraction is 45 to 50%.  September 2020 Hypokalemia Who presents for follow-up of his diastolic and systolic CHF  In follow-up today he reports 20 to 30 pound weight gain, He has massive leg edema extending up into his thighs, also with scrotal edema Has been taking torsemide 10 mg daily Denies having excessive fluid intake Works as Company secretary, outside in the hot Difficulty getting around secondary to leg swelling, cannot bend his legs very well  Reports his ideal weight is likely around 150 pounds or less Current weight 174 pounds  EKG personally reviewed by myself on todays visit Shows normal sinus rhythm with rate 77 bpm, APCs no significant ST or T wave changes  PMH:   has a past medical history of Anemia of chronic disease, Chronic combined systolic (congestive) and diastolic (congestive) heart failure ([place]), CKD (chronic kidney disease), stage III, Diabetes mellitus without complication ([place]), Hypertension, Left Hydropneumothorax/Fibrinopurulent empyema, PAF (paroxysmal atrial fibrillation) ([place]), and Sepsis ([place]).  PSH:    Past Surgical History:  Procedure Laterality Date  . HAND SURGERY Right   . I & D EXTREMITY Right 06/11/2019   Procedure: IRRIGATION AND DEBRIDEMENT RIGHT THUMB;  Surgeon: Dereck Leep, MD;  Location: [facility];   Service: Orthopedics;  Laterality: Right;  . TEE WITHOUT CARDIOVERSION N/A 06/14/2019   Procedure: TRANSESOPHAGEAL ECHOCARDIOGRAM (TEE);  Surgeon: Minna Merritts, MD;  Location: [facility];  Service: Cardiovascular;  Laterality: N/A;  . VIDEO ASSISTED THORACOSCOPY (VATS)/THOROCOTOMY Left 06/26/2019   Procedure: VIDEO ASSISTED THORACOSCOPY DECORTICATION;  Surgeon: Lajuana Matte, MD;  Location: [place];  Service: Thoracic;  Laterality: Left;  Marland Kitchen VIDEO BRONCHOSCOPY N/A 06/26/2019   Procedure: VIDEO BRONCHOSCOPY;  Surgeon: Lajuana Matte, MD;  [facility];  Service: Thoracic;  Laterality: N/A;    Current Outpatient Medications  Medication Sig Dispense Refill  . apixaban (ELIQUIS) 5 MG TABS tablet Take 1 tablet (5 mg total) by mouth 2 (two) times daily. 60 tablet   . atorvastatin (LIPITOR) 40 MG tablet Take 40 mg by mouth daily.    . carvedilol (COREG) 25 MG tablet Take 1 tablet (25 mg total) by mouth 2 (two) times daily. 180 tablet 3  . ferrous sulfate 325 (65 FE) MG tablet Take 1 tablet (325 mg total) by mouth 2 (two) times daily with a meal. (Patient taking differently: Take 325 mg by mouth daily with breakfast. ) 60 tablet 1  . hydrALAZINE (APRESOLINE) 25 MG tablet Take 1 tablet (25 mg total) by mouth 3 (three) times daily. 270 tablet 3  . isosorbide mononitrate (IMDUR) 30 MG 24 hr tablet Take 1 tablet (30 mg total) by mouth daily. 90 tablet 3  . metFORMIN (GLUCOPHAGE-XR) 500 MG 24 hr tablet Take 1 tablet by mouth daily with breakfast.     . metolazone (ZAROXOLYN) 2.5 MG tablet Take 1 tablet (2.5 mg total) 30  mins prior to Lasix dose on mondays. 30 tablet 3  . Multiple Vitamin (MULTIVITAMIN) tablet Take 1 tablet by mouth daily.    . Potassium Chloride ER 20 MEQ TBCR Take 20 mEq by mouth every Monday, Wednesday, and Friday.    . torsemide (DEMADEX) 20 MG tablet Take 0.5 tablets (10 mg total) by mouth daily. 36 tablet 1   No current facility-administered medications for this visit.      Allergies:   Patient has no known allergies.   Social History:  The patient  reports that he has never smoked. He has never used smokeless tobacco. He reports that he does not drink alcohol and does not use drugs.   Family History:   family history includes CAD in his father; Diabetes in his brother, sister, sister, and sister.    Review of Systems: Review of Systems  Constitutional: Positive for weight loss.  HENT: Negative.   Respiratory: Negative.   Cardiovascular: Negative.   Gastrointestinal: Negative.   Musculoskeletal: Negative.   Neurological: Negative.   Psychiatric/Behavioral: Negative.   All other systems reviewed and are negative.   PHYSICAL EXAM: VS:  BP (!) 160/74 (BP Location: Left Arm, Patient Position: Sitting, Cuff Size: Normal)   Pulse 77   Ht 5\' 9"  (1.753 m)   Wt 172 lb 4 oz (78.1 kg)   SpO2 98%   BMI 25.44 kg/m  , BMI Body mass index is 25.44 kg/m. Constitutional:  oriented to person, place, and time. No distress.  HENT:  Head: Grossly normal Eyes:  no discharge. No scleral icterus.  Neck: No JVD, no carotid bruits  Cardiovascular: Regular rate and rhythm, no murmurs appreciated 2+ pitting edema to the thighs, scrotal edema reported Pulmonary/Chest: Clear to auscultation bilaterally, no wheezes or rales Abdominal: Soft.  no distension.  no tenderness.  Musculoskeletal: Normal range of motion Neurological:  normal muscle tone. Coordination normal. No atrophy Skin: Skin warm and dry Psychiatric: normal affect, pleasant  Recent Labs: 06/13/2019: TSH 2.826 06/19/2019: B Natriuretic Peptide 3,281.0 09/03/2019: Magnesium 1.9 10/22/2019: Hemoglobin 9.1; Platelets 408 01/21/2020: ALT 28 05/30/2020: BUN 64; Creatinine, Ser 2.49; Potassium 4.6; Sodium 141    Lipid Panel Lab Results  Component Value Date   CHOL 138 06/26/2019   HDL 39 (L) 06/26/2019   LDLCALC 75 06/26/2019   TRIG 119 06/26/2019      Wt Readings from Last 3 Encounters:   06/03/20 172 lb 4 oz (78.1 kg)  05/23/20 167 lb 4 oz (75.9 kg)  01/21/20 141 lb 6 oz (64.1 kg)      ASSESSMENT AND PLAN:  Problem List Items Addressed This Visit      Cardiology Problems   Hypertension   Acute on chronic combined systolic and diastolic CHF (congestive heart failure) (HCC)     Other   CKD (chronic kidney disease), stage III    Other Visit Diagnoses    PAF (paroxysmal atrial fibrillation) ([place])    -  Primary   Coronary artery calcification seen on CT scan       Exertional chest pain       Cardiomyopathy, unspecified type ([place])         Acute on chronic systolic and diastolic CHF Massive weight gain, leg edema, scrotal edema on today's visit In effort to avoid hospitalization we will maximize his diuretics We will start torsemide 40 daily with metolazone 2.5 mg 3 days a week for weight over 150 pounds Recommend he call us next week with his weight and  if symptoms are improving -Likely has cardiorenal syndrome with worsening renal dysfunction in the setting of worsening heart failure  Diabetes type 2 Recommend he talk with primary care to stop Metformin given worsening renal function past 2 months Most recent creatinine 2.49 with BUN 64  Essential hypertension Blood pressure elevated today, likely exacerbated by massive fluid overload  Acute on CKD Up to 1.9 with GI  M,arch 2021 He was unaware Repeats labs today, this will help guide his diuresis  Transaminitis Elevated GGT Unclear if this is secondary to hepatic congestion  Diabetes type 2 Hemoglobin A1c 6.9 Not a good candidate for Metformin given worsening renal failure  Disposition:   F/U 1 month   Total encounter time more than 25 minutes  Greater than 50% was spent in counseling and coordination of care with the patient    Signed, Esmond Plants, M.D., Ph.D. [place], [place]

## 2020-06-06 ENCOUNTER — Other Ambulatory Visit: Payer: Self-pay | Admitting: Cardiovascular Disease

## 2020-06-06 NOTE — Telephone Encounter (Signed)
Please resend. Pharmacy did not receive.

## 2020-06-09 MED ORDER — TORSEMIDE 20 MG PO TABS
40.0000 mg | ORAL_TABLET | Freq: Every day | ORAL | 0 refills | Status: DC
Start: 2020-06-09 — End: 2020-07-21

## 2020-06-09 NOTE — Telephone Encounter (Signed)
Patient would like 90 day supply of Torsemide 40 mg bid  sent to [facility] [address] instead of the 30 day  Last prescription was sent in incorrectly according to last office note

## 2020-06-09 NOTE — Addendum Note (Signed)
Addended by: Raelene Bott, Stepfon Rawles L on: 06/09/2020 02:54 PM   Modules accepted: Orders

## 2020-06-09 NOTE — Telephone Encounter (Signed)
Requested Prescriptions   Signed Prescriptions Disp Refills   torsemide (DEMADEX) 20 MG tablet 60 tablet 0    Sig: Take 2 tablets (40 mg total) by mouth daily.    Authorizing Provider: Minna Merritts    Ordering User: Raelene Bott, Krisna Omar L

## 2020-06-13 ENCOUNTER — Ambulatory Visit: Payer: Self-pay | Admitting: Nurse Practitioner

## 2020-07-20 NOTE — Progress Notes (Signed)
Cardiology Office Note  Date:  07/21/2020   ID:  Nathan [name], DOB [birth year], MRN [id number]  PCP:  Derinda Late, MD   Chief Complaint  Patient presents with  . other    1 month follow up. Meds reviewed by the pt. verbally. Pt. c/o shortness of breath and LE edema.     HPI:  Nathan [name] is a 59 y.o. male with past medical history of chronic combined systolic/diastolic heart failure,  cardiomyopathy (ischemic versus nonischemic),  paroxysmal atrial fib.,  left hydropneumothorax status post VATS October 2020, insulin-dependent diabetes chronic kidney disease stage III,  MRSA bacteremia,  anemia of chronic disease,  hypertension,  smoking,   Left ventricular ejection fraction is 45 to 50%.  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 or less Last visit weight174 pounds We increase his torsemide up to 40 daily with metolazone 2.5 every other day Occasionally skipping some doses Today 164 pounds  Torsemide 40 daily, 20 mg on weekends Metolazone 2.5mg   2-3 a week  Blood pressure running high Feels he could do better if he could just get his blood pressure down Leg still markedly swollen BMP pending today  EKG personally reviewed by myself on todays visit Shows normal sinus rhythm with rate 65 bpm, APCs no significant ST or T wave changes  PMH:   has a past medical history of Anemia of chronic disease, Chronic combined systolic (congestive) and diastolic (congestive) heart failure ([place]), CKD (chronic kidney disease), stage III ([place]), Diabetes mellitus without complication ([place]), Hypertension,  Left Hydropneumothorax/Fibrinopurulent empyema, PAF (paroxysmal atrial fibrillation) ([place]), and Sepsis ([place]).  PSH:    Past Surgical History:  Procedure Laterality Date  . HAND SURGERY Right   . I & D EXTREMITY Right 06/11/2019   Procedure: IRRIGATION AND DEBRIDEMENT RIGHT THUMB;  Surgeon: Dereck Leep, MD;  Location: [facility];  Service: Orthopedics;  Laterality: Right;  . TEE WITHOUT CARDIOVERSION N/A 06/14/2019   Procedure: TRANSESOPHAGEAL ECHOCARDIOGRAM (TEE);  Surgeon: Minna Merritts, MD;  Location: [facility];  Service: Cardiovascular;  Laterality: N/A;  . VIDEO ASSISTED THORACOSCOPY (VATS)/THOROCOTOMY Left 06/26/2019   Procedure: VIDEO ASSISTED THORACOSCOPY DECORTICATION;  Surgeon: Lajuana Matte, MD;  Location: [facility];  Service: Thoracic;  Laterality: Left;  Marland Kitchen VIDEO BRONCHOSCOPY N/A 06/26/2019   Procedure: VIDEO BRONCHOSCOPY;  Surgeon: Lajuana Matte, MD;  Location: [facility];  Service: Thoracic;  Laterality: N/A;    Current Outpatient Medications  Medication Sig Dispense Refill  . apixaban (ELIQUIS) 5 MG TABS tablet Take 1 tablet (5 mg total) by mouth 2 (two) times daily. 60 tablet   . atorvastatin (LIPITOR) 40 MG tablet Take 40 mg by mouth daily.    . carvedilol (COREG) 25 MG tablet Take 1 tablet (25 mg total) by mouth 2 (two) times daily. 180 tablet 3  . ferrous sulfate 325 (65 FE) MG tablet Take 1 tablet (325 mg total) by mouth 2 (two) times daily with a meal. (Patient taking differently: Take 325 mg by mouth daily with breakfast. ) 60 tablet 1  . glipiZIDE (GLUCOTROL XL) 2.5 MG 24 hr tablet Take 2.5 mg  by mouth daily with breakfast.     . hydrALAZINE (APRESOLINE) 25 MG tablet Take 1 tablet (25 mg total) by mouth 3 (three) times daily. 270 tablet 3  . isosorbide mononitrate (IMDUR) 30 MG 24 hr tablet Take 1 tablet (30 mg total) by mouth daily. 90 tablet 3  . metolazone (ZAROXOLYN) 2.5 MG tablet Take 1 tablet (2.5 mg total) 30 mins prior to Lasix dose three days a week. 30  tablet 3  . Multiple Vitamin (MULTIVITAMIN) tablet Take 1 tablet by mouth daily.    . Potassium Chloride ER 20 MEQ TBCR Take 20 mEq by mouth every Monday, Wednesday, and Friday.    . torsemide (DEMADEX) 20 MG tablet Take 2 tablets (40 mg total) by mouth daily. 60 tablet 0   No current facility-administered medications for this visit.     Allergies:   Patient has no known allergies.   Social History:  The patient  reports that he has never smoked. He has never used smokeless tobacco. He reports that he does not drink alcohol and does not use drugs.   Family History:   family history includes CAD in his father; Diabetes in his brother, sister, sister, and sister.    Review of Systems: Review of Systems  Constitutional: Positive for weight loss.  HENT: Negative.   Respiratory: Negative.   Cardiovascular: Negative.   Gastrointestinal: Negative.   Musculoskeletal: Negative.   Neurological: Negative.   Psychiatric/Behavioral: Negative.   All other systems reviewed and are negative.   PHYSICAL EXAM: VS:  BP (!) 160/80 (BP Location: Left Arm, Patient Position: Sitting, Cuff Size: Normal)   Pulse 65   Ht 5\' 9"  (1.753 m)   Wt 164 lb 4 oz (74.5 kg)   SpO2 98%   BMI 24.26 kg/m  , BMI Body mass index is 24.26 kg/m. Constitutional:  oriented to person, place, and time. No distress.  HENT:  Head: Grossly normal Eyes:  no discharge. No scleral icterus.  Neck: No JVD, no carotid bruits  Cardiovascular: Regular rate and rhythm, no murmurs appreciated 2+ pitting lower extremity edema to the thighs pulmonary/Chest: Clear to auscultation bilaterally, no wheezes or rails Abdominal: Soft.  no distension.  no tenderness.  Musculoskeletal: Normal range of motion Neurological:  normal muscle tone. Coordination normal. No atrophy Skin: Skin warm and dry Psychiatric: normal affect, pleasant   Recent Labs: 09/03/2019: Magnesium 1.9 10/22/2019: Hemoglobin 9.1; Platelets 408 01/21/2020: ALT  28 05/30/2020: BUN 64; Creatinine, Ser 2.49; Potassium 4.6; Sodium 141    Lipid Panel Lab Results  Component Value Date   CHOL 138 06/26/2019   HDL 39 (L) 06/26/2019   LDLCALC 75 06/26/2019   TRIG 119 06/26/2019    Wt Readings from Last 3 Encounters:  07/21/20 164 lb 4 oz (74.5 kg)  06/03/20 172 lb 4 oz (78.1 kg)  05/23/20 167 lb 4 oz (75.9 kg)      ASSESSMENT AND PLAN:  Problem List Items Addressed This Visit    CKD (chronic kidney disease), stage III ([place])    Other Visit Diagnoses    PAF (paroxysmal atrial fibrillation) ([place])    -  Primary   Cardiomyopathy, unspecified type ([place])       Coronary artery calcification seen on CT scan       Essential hypertension         Acute on chronic systolic and diastolic CHF Weight is down 8 pounds from 6 weeks ago Left scrotal edema, leg swelling still persists BMP pending today  with other lab work including TSH, CBC -We have ordered repeat echocardiogram -Suspected cardiorenal syndrome though unable to exclude underlying renal disease -Depending on lab work results, may refer him to nephrology.  This was discussed with him Prior lab work did show anemia but this has been relatively stable around 10.  Reason for low numbers unclear but will review primary care notes  Diabetes type 2 Prior  creatinine 2.49 with BUN 64 Repeat lab work pending Primary care numbers reviewed  Essential hypertension We will add isosorbide up to 30 twice daily and hydralazine 50 3 times daily Avoid ACE ARB given renal dysfunction  Acute on CKD Unable to exclude cardiorenal syndrome, will need referral to nephrology if no improvement  Transaminitis Elevated GGT Repeat LFTs pending  Diabetes type 2 Hemoglobin A1c 6.9 Poor candidate for Metformin in the setting of renal dysfunction     Total encounter time more than 25 minutes  Greater than 50% was spent in counseling and coordination of care with the patient    Signed, Esmond Plants, M.D.,  Ph.D. [place], [place]

## 2020-07-21 ENCOUNTER — Encounter: Payer: Self-pay | Admitting: Cardiovascular Disease

## 2020-07-21 ENCOUNTER — Ambulatory Visit (INDEPENDENT_AMBULATORY_CARE_PROVIDER_SITE_OTHER): Payer: Self-pay | Admitting: Cardiovascular Disease

## 2020-07-21 ENCOUNTER — Other Ambulatory Visit
Admission: RE | Admit: 2020-07-21 | Discharge: 2020-07-21 | Disposition: A | Discharge status: Home / Self Care | Payer: Self-pay | Attending: Cardiovascular Disease | Admitting: Cardiovascular Disease

## 2020-07-21 ENCOUNTER — Other Ambulatory Visit: Payer: Self-pay

## 2020-07-21 VITALS — BP 160/80 | HR 65 | Ht 69.0 in | Wt 164.2 lb

## 2020-07-21 DIAGNOSIS — M7989 Other specified soft tissue disorders: Secondary | ICD-10-CM

## 2020-07-21 DIAGNOSIS — I480 Paroxysmal atrial fibrillation: Secondary | ICD-10-CM | POA: Insufficient documentation

## 2020-07-21 DIAGNOSIS — I2510 Atherosclerotic heart disease of native coronary artery without angina pectoris: Secondary | ICD-10-CM

## 2020-07-21 DIAGNOSIS — N183 Chronic kidney disease, stage 3 unspecified: Secondary | ICD-10-CM

## 2020-07-21 DIAGNOSIS — I10 Essential (primary) hypertension: Secondary | ICD-10-CM | POA: Insufficient documentation

## 2020-07-21 DIAGNOSIS — I429 Cardiomyopathy, unspecified: Secondary | ICD-10-CM

## 2020-07-21 LAB — CBC WITH DIFFERENTIAL/PLATELET
Abs Immature Granulocytes: 0.02 10*3/uL (ref 0.00–0.07)
Basophils Absolute: 0.1 10*3/uL (ref 0.0–0.1)
Basophils Relative: 3 %
Eosinophils Absolute: 0.2 10*3/uL (ref 0.0–0.5)
Eosinophils Relative: 9 %
HCT: 26.2 % — ABNORMAL LOW (ref 39.0–52.0)
Hemoglobin: 8.2 g/dL — ABNORMAL LOW (ref 13.0–17.0)
Immature Granulocytes: 1 %
Lymphocytes Relative: 21 %
Lymphs Abs: 0.4 10*3/uL — ABNORMAL LOW (ref 0.7–4.0)
MCH: 30 pg (ref 26.0–34.0)
MCHC: 31.3 g/dL (ref 30.0–36.0)
MCV: 96 fL (ref 80.0–100.0)
Monocytes Absolute: 0.8 10*3/uL (ref 0.1–1.0)
Monocytes Relative: 40 %
Neutro Abs: 0.5 10*3/uL — ABNORMAL LOW (ref 1.7–7.7)
Neutrophils Relative %: 26 %
Platelets: 343 10*3/uL (ref 150–400)
RBC: 2.73 MIL/uL — ABNORMAL LOW (ref 4.22–5.81)
RDW: 13.8 % (ref 11.5–15.5)
WBC: 1.9 10*3/uL — ABNORMAL LOW (ref 4.0–10.5)
nRBC: 0 % (ref 0.0–0.2)

## 2020-07-21 LAB — COMPREHENSIVE METABOLIC PANEL
ALT: 59 U/L — ABNORMAL HIGH (ref 0–44)
AST: 32 U/L (ref 15–41)
Albumin: 2.7 g/dL — ABNORMAL LOW (ref 3.5–5.0)
Alkaline Phosphatase: 960 U/L — ABNORMAL HIGH (ref 38–126)
Anion gap: 7 (ref 5–15)
BUN: 92 mg/dL — ABNORMAL HIGH (ref 6–20)
CO2: 20 mmol/L — ABNORMAL LOW (ref 22–32)
Calcium: 8.2 mg/dL — ABNORMAL LOW (ref 8.9–10.3)
Chloride: 114 mmol/L — ABNORMAL HIGH (ref 98–111)
Creatinine, Ser: 2.69 mg/dL — ABNORMAL HIGH (ref 0.61–1.24)
GFR, Estimated: 27 mL/min — ABNORMAL LOW (ref >60)
Glucose, Bld: 106 mg/dL — ABNORMAL HIGH (ref 70–99)
Potassium: 4.9 mmol/L (ref 3.5–5.1)
Sodium: 141 mmol/L (ref 135–145)
Total Bilirubin: 0.8 mg/dL (ref 0.3–1.2)
Total Protein: 6.1 g/dL — ABNORMAL LOW (ref 6.5–8.1)

## 2020-07-21 LAB — TSH: TSH: 7.396 u[IU]/mL — ABNORMAL HIGH (ref 0.350–4.500)

## 2020-07-21 MED ORDER — HYDRALAZINE HCL 50 MG PO TABS
50.0000 mg | ORAL_TABLET | Freq: Three times a day (TID) | ORAL | 3 refills | Status: DC
Start: 2020-07-21 — End: 2020-12-04

## 2020-07-21 MED ORDER — TORSEMIDE 20 MG PO TABS
40.0000 mg | ORAL_TABLET | Freq: Every day | ORAL | 1 refills | Status: DC
Start: 2020-07-21 — End: 2021-03-19

## 2020-07-21 MED ORDER — ISOSORBIDE MONONITRATE ER 30 MG PO TB24
30.0000 mg | ORAL_TABLET | Freq: Two times a day (BID) | ORAL | 3 refills | Status: DC
Start: 2020-07-21 — End: 2021-11-06

## 2020-07-21 NOTE — Patient Instructions (Addendum)
Medication Instructions:  Please increase the hydralazine up to 50 mg three times a day Increase the isosorbide up to 30 mg twice a day  If you need a refill on your cardiac medications before your next appointment, please call your pharmacy.    Lab work: Your physician recommends that you have lab work today: CMP, CBC, TSH - Go to the [facility] Entrance - 1st desk on the right to check in (Registration)   If you have labs (blood work) drawn today and your tests are completely normal, you will receive your results only by: Marland Kitchen MyChart Message (if you have MyChart) OR . A paper copy in the mail If you have any lab test that is abnormal or we need to change your treatment, we will call you to review the results.   Testing/Procedures: - Your physician has requested that you have an echocardiogram in 1 month. Echocardiography is a painless test that uses sound waves to create images of your heart. It provides your doctor with information about the size and shape of your heart and how well your heart's chambers and valves are working. This procedure takes approximately one hour. There are no restrictions for this procedure. There is a possibility that an IV may need to be started during your test to inject an image enhancing agent. This is done to obtain more optimal pictures of your heart. Therefore we ask that you do at least drink some water prior to coming in to hydrate your veins.       Follow-Up: At [facility], you and your health needs are our priority.  As part of our continuing mission to provide you with exceptional heart care, we have created designated Provider Care Teams.  These Care Teams include your primary Cardiologist (physician) and Advanced Practice Providers (APPs -  Physician Assistants and Nurse Practitioners) who all work together to provide you with the care you need, when you need it.  . You will need a follow up appointment in 1 month  . Providers on your  designated Care Team:   . Murray Hodgkins, NP . Christell Faith, PA-C . Marrianne Mood, PA-C  Any Other Special Instructions Will Be Listed Below (If Applicable).  COVID-19 Vaccine Information can be found at: ShippingScam.co.uk For questions related to vaccine distribution or appointments, please email [email or web address] or call [phone])[phone].     Echocardiogram An echocardiogram is a procedure that uses painless sound waves (ultrasound) to produce an image of the heart. Images from an echocardiogram can provide important information about:  Signs of coronary artery disease (CAD).  Aneurysm detection. An aneurysm is a weak or damaged part of an artery wall that bulges out from the normal force of blood pumping through the body.  Heart size and shape. Changes in the size or shape of the heart can be associated with certain conditions, including heart failure, aneurysm, and CAD.  Heart muscle function.  Heart valve function.  Signs of a past heart attack.  Fluid buildup around the heart.  Thickening of the heart muscle.  A tumor or infectious growth around the heart valves. Tell a health care provider about:  Any allergies you have.  All medicines you are taking, including vitamins, herbs, eye drops, creams, and over-the-counter medicines.  Any blood disorders you have.  Any surgeries you have had.  Any medical conditions you have.  Whether you are pregnant or may be pregnant. What are the risks? Generally, this is a safe procedure. However, problems may occur,  including:  Allergic reaction to dye (contrast) that may be used during the procedure. What happens before the procedure? No specific preparation is needed. You may eat and drink normally. What happens during the procedure?   An IV tube may be inserted into one of your veins.  You may receive contrast through this tube. A contrast is an injection  that improves the quality of the pictures from your heart.  A gel will be applied to your chest.  A wand-like tool (transducer) will be moved over your chest. The gel will help to transmit the sound waves from the transducer.  The sound waves will harmlessly bounce off of your heart to allow the heart images to be captured in real-time motion. The images will be recorded on a computer. The procedure may vary among health care providers and hospitals. What happens after the procedure?  You may return to your normal, everyday life, including diet, activities, and medicines, unless your health care provider tells you not to do that. Summary  An echocardiogram is a procedure that uses painless sound waves (ultrasound) to produce an image of the heart.  Images from an echocardiogram can provide important information about the size and shape of your heart, heart muscle function, heart valve function, and fluid buildup around your heart.  You do not need to do anything to prepare before this procedure. You may eat and drink normally.  After the echocardiogram is completed, you may return to your normal, everyday life, unless your health care provider tells you not to do that. This information is not intended to replace advice given to you by your health care provider. Make sure you discuss any questions you have with your health care provider. Document Revised: 12/28/2018 Document Reviewed: 10/09/2016 Elsevier Patient Education  [place].

## 2020-07-28 ENCOUNTER — Ambulatory Visit: Payer: Self-pay | Admitting: Cardiovascular Disease

## 2020-07-30 ENCOUNTER — Telehealth: Payer: Self-pay | Admitting: Cardiovascular Disease

## 2020-07-30 NOTE — Telephone Encounter (Signed)
I spoke with the patient regarding his various abnormal lab results. He denies any current ETOH use. He denies having ever seen a nephrologist.  I have inquired if he would like [place] to set up a referral or speak with his PCP prior to this occurring.  Per the patient, he has follow up with Dr. Baldemar Lenis soon. He would prefer to see him first.  I have advised the patient I will forward a copy of his lab report to his PCP today and if he doesn't hear anything from their office by the end of the week, to please call them to follow up.  The patient voices understanding and is agreeable.

## 2020-07-30 NOTE — Telephone Encounter (Signed)
Nathan Merritts, MD  07/27/2020 2:48 PM EST     Labs reviewed Lots of abnormalities noted, etiology unclear Elevated alk phos, worsening rebal function,, anemia, drop in WBC, TSH up -would stay on torsemide, hold metolazone for now Would confirm no etoh, Needs referral to nephrology, would also suggest he talk labs over with PMD

## 2020-08-07 ENCOUNTER — Other Ambulatory Visit: Payer: Self-pay

## 2020-08-25 ENCOUNTER — Other Ambulatory Visit: Payer: Self-pay

## 2020-08-25 ENCOUNTER — Emergency Department: Payer: Self-pay

## 2020-08-25 ENCOUNTER — Emergency Department
Admission: EM | Admit: 2020-08-25 | Discharge: 2020-08-25 | Disposition: A | Discharge status: Home / Self Care | Payer: Self-pay | Attending: Emergency Medicine | Admitting: Emergency Medicine

## 2020-08-25 DIAGNOSIS — I5042 Chronic combined systolic (congestive) and diastolic (congestive) heart failure: Secondary | ICD-10-CM | POA: Insufficient documentation

## 2020-08-25 DIAGNOSIS — I130 Hypertensive heart and chronic kidney disease with heart failure and stage 1 through stage 4 chronic kidney disease, or unspecified chronic kidney disease: Secondary | ICD-10-CM | POA: Insufficient documentation

## 2020-08-25 DIAGNOSIS — Z7984 Long term (current) use of oral hypoglycemic drugs: Secondary | ICD-10-CM | POA: Insufficient documentation

## 2020-08-25 DIAGNOSIS — N183 Chronic kidney disease, stage 3 unspecified: Secondary | ICD-10-CM | POA: Insufficient documentation

## 2020-08-25 DIAGNOSIS — E1122 Type 2 diabetes mellitus with diabetic chronic kidney disease: Secondary | ICD-10-CM | POA: Insufficient documentation

## 2020-08-25 DIAGNOSIS — M25551 Pain in right hip: Secondary | ICD-10-CM | POA: Insufficient documentation

## 2020-08-25 MED ORDER — LIDOCAINE 5 % EX PTCH: 1.0000 | MEDICATED_PATCH | Freq: Two times a day (BID) | CUTANEOUS | 0 refills | Status: DC

## 2020-08-25 MED ORDER — TRAMADOL HCL 50 MG PO TABS: 50.0000 mg | ORAL_TABLET | Freq: Four times a day (QID) | ORAL | 0 refills | Status: DC | PRN

## 2020-08-25 NOTE — ED Provider Notes (Signed)
[facility] Emergency Department Provider Note  ____________________________________________  Time seen: Approximately 11:08 AM  I have reviewed the triage vital signs and the nursing notes.   HISTORY  Chief Complaint Hip Pain    HPI Nathan [name] is a 59 y.o. male that presents to the emergency department for evaluation of right hip pain for 2 weeks.  Patient states that he fell 2 weeks ago going up the stairs and landed on his right hip.  Pain does not radiate.  He states that it feels like a "pinched nerve." Pain has fluctuated over the last 2 weeks, and some days it improves and some days it worsens.  He states it improved following a long walking trip at the store and thinks that "working it out" helped with the symptoms. He has been using a cane. He has been taking Tylenol.  He is on Eliquis.  No back pain, leg pain, numbness, tingling.  Past Medical History:  Diagnosis Date  . Anemia of chronic disease   . Chronic combined systolic (congestive) and diastolic (congestive) heart failure ([place])    a. 05/2019 Echo: EF 45-50%, nl LA/RA size. Mild to mod TR.  . CKD (chronic kidney disease), stage III ([place])   . Diabetes mellitus without complication ([place])   . Hypertension   . Left Hydropneumothorax/Fibrinopurulent empyema    a. 06/2019 s/p VATS.  Marland Kitchen PAF (paroxysmal atrial fibrillation) ([place])    a. 05/2019 in setting of sepsis-->s/p TEE/DCCV; b. CHA2DS2VASc = 2-->Eliquis.  . Sepsis ([place])    a. 05/2019 MRSA in setting of infected R thumb.    Patient Active Problem List   Diagnosis Date Noted  . Atrial fibrillation ([place])   . Diabetes mellitus without complication ([place])   . AKI (acute kidney injury) ([place])   . Hydropneumothorax 06/24/2019  . Hypertension 06/24/2019  . Insulin dependent diabetes mellitus type IA ([place]) 06/24/2019  . Anemia of chronic disease 06/24/2019  . Atrial fibrillation, chronic ([place]) 06/24/2019  . Acute on chronic combined systolic  and diastolic CHF (congestive heart failure) ([place]) 06/24/2019  . Cellulitis of right hand 06/24/2019  . CKD (chronic kidney disease), stage III ([place])   . Empyema ([place])   . Sepsis ([place]) 06/10/2019    Past Surgical History:  Procedure Laterality Date  . HAND SURGERY Right   . I & D EXTREMITY Right 06/11/2019   Procedure: IRRIGATION AND DEBRIDEMENT RIGHT THUMB;  Surgeon: Dereck Leep, MD;  Location: [facility] ORS;  Service: Orthopedics;  Laterality: Right;  . TEE WITHOUT CARDIOVERSION N/A 06/14/2019   Procedure: TRANSESOPHAGEAL ECHOCARDIOGRAM (TEE);  Surgeon: Minna Merritts, MD;  Location: [facility] ORS;  Service: Cardiovascular;  Laterality: N/A;  . VIDEO ASSISTED THORACOSCOPY (VATS)/THOROCOTOMY Left 06/26/2019   Procedure: VIDEO ASSISTED THORACOSCOPY DECORTICATION;  Surgeon: Lajuana Matte, MD;  Location: [place];  Service: Thoracic;  Laterality: Left;  Marland Kitchen VIDEO BRONCHOSCOPY N/A 06/26/2019   Procedure: VIDEO BRONCHOSCOPY;  Surgeon: Lajuana Matte, MD;  Location: [place];  Service: Thoracic;  Laterality: N/A;    Prior to Admission medications   Medication Sig Start Date End Date Taking? Authorizing Provider  apixaban (ELIQUIS) 5 MG TABS tablet Take 1 tablet (5 mg total) by mouth 2 (two) times daily. 09/03/19   Dunn, Areta Haber, PA-C  atorvastatin (LIPITOR) 40 MG tablet Take 40 mg by mouth daily. 04/03/20   [provider]  carvedilol (COREG) 25 MG tablet Take 1 tablet (25 mg total) by mouth 2 (two) times daily. 09/19/19 07/21/20  Verta Ellen., NP  ferrous sulfate 325 (65 FE) MG tablet Take 1 tablet (325 mg total) by mouth 2 (two) times daily with a meal. Patient taking differently: Take 325 mg by mouth daily with breakfast.  06/30/19   Regalado, Belkys A, MD  glipiZIDE (GLUCOTROL XL) 2.5 MG 24 hr tablet Take 2.5 mg by mouth daily with breakfast.  06/09/20 06/09/21  [provider]  hydrALAZINE (APRESOLINE) 50 MG tablet Take 1 tablet (50 mg total) by mouth 3 (three) times  daily. 07/21/20   Minna Merritts, MD  isosorbide mononitrate (IMDUR) 30 MG 24 hr tablet Take 1 tablet (30 mg total) by mouth 2 (two) times daily. 07/21/20   Minna Merritts, MD  lidocaine (LIDODERM) 5 % Place 1 patch onto the skin every 12 (twelve) hours. Remove & Discard patch within 12 hours or as directed by MD 08/25/20 08/25/21  Laban Emperor, PA-C  metolazone (ZAROXOLYN) 2.5 MG tablet Take 1 tablet (2.5 mg total) 30 mins prior to Lasix dose three days a week. 06/03/20   Minna Merritts, MD  Multiple Vitamin (MULTIVITAMIN) tablet Take 1 tablet by mouth daily.    [provider]  Potassium Chloride ER 20 MEQ TBCR Take 20 mEq by mouth every Monday, Wednesday, and Friday. 01/25/20 07/21/20  Minna Merritts, MD  torsemide (DEMADEX) 20 MG tablet Take 2 tablets (40 mg total) by mouth daily. 07/21/20   Minna Merritts, MD  traMADol (ULTRAM) 50 MG tablet Take 1 tablet (50 mg total) by mouth every 6 (six) hours as needed. 08/25/20 08/25/21  Laban Emperor, PA-C    Allergies Patient has no known allergies.  Family History  Problem Relation Age of Onset  . CAD Father   . Diabetes Sister   . Diabetes Brother   . Diabetes Sister   . Diabetes Sister     Social History Social History   Tobacco Use  . Smoking status: Never Smoker  . Smokeless tobacco: Never Used  Vaping Use  . Vaping Use: Never used  Substance Use Topics  . Alcohol use: Never  . Drug use: Never     Review of Systems  Constitutional: No fever/chills Cardiovascular: No chest pain. Respiratory: No SOB. Gastrointestinal: No abdominal pain.  No nausea, no vomiting.  Musculoskeletal: Positive for hip pain. Skin: Negative for rash, abrasions, lacerations, ecchymosis. Neurological: Negative for headaches, numbness or tingling   ____________________________________________   PHYSICAL EXAM:  VITAL SIGNS: ED Triage Vitals  Enc Vitals Group     BP 08/25/20 0908 (!) 143/71     Pulse Rate 08/25/20 0908 64      Resp 08/25/20 0908 18     Temp 08/25/20 0908 97.8 F (36.6 C)     Temp Source 08/25/20 0908 Oral     SpO2 08/25/20 0908 98 %     Weight 08/25/20 0907 160 lb (72.6 kg)     Height 08/25/20 0907 5\' 9"  (1.753 m)     Head Circumference --      Peak Flow --      Pain Score 08/25/20 0907 5     Pain Loc --      Pain Edu? --      Excl. in [place]? --      Constitutional: Alert and oriented. Well appearing and in no acute distress. Eyes: Conjunctivae are normal. PERRL. EOMI. Head: Atraumatic. ENT:      Ears:      Nose: No congestion/rhinnorhea.  Mouth/Throat: Mucous membranes are moist.  Neck: No stridor.   Cardiovascular: Normal rate, regular rhythm.  Good peripheral circulation. Respiratory: Normal respiratory effort without tachypnea or retractions. Lungs CTAB. Good air entry to the bases with no decreased or absent breath sounds. Gastrointestinal: Bowel sounds 4 quadrants. Soft and nontender to palpation. No guarding or rigidity. No palpable masses. No distention.  Musculoskeletal: Full range of motion to all extremities. No gross deformities appreciated.  Tenderness to palpation to right trochanteric bursa.  Full range of motion of right hip.  Weightbearing.  Antalgic gait. Neurologic:  Normal speech and language. No gross focal neurologic deficits are appreciated.  Skin:  Skin is warm, dry and intact. No rash noted. Psychiatric: Mood and affect are normal. Speech and behavior are normal. Patient exhibits appropriate insight and judgement.   ____________________________________________   LABS (all labs ordered are listed, but only abnormal results are displayed)  Labs Reviewed - No data to display ____________________________________________  EKG   ____________________________________________  RADIOLOGY Robinette Haines, personally viewed and evaluated these images (plain radiographs) as part of my medical decision making, as well as reviewing the written report by the  radiologist.  DG Hip Unilat  With Pelvis 2-3 Views Right  Result Date: 08/25/2020 CLINICAL DATA:  Right hip pain after fall 2 weeks ago EXAM: DG HIP (WITH OR WITHOUT PELVIS) 2-3V RIGHT COMPARISON:  None. FINDINGS: No acute fracture is seen. Hip osteoarthritis with marginal spurring on the right more than left, with ring of osteophytes overlapping the right femoral neck. Premature arterial calcification which is confluent in the thighs. IMPRESSION: 1. No acute finding. 2. Hip osteoarthritis with asymmetric right-sided spurring. Electronically Signed   By: Monte Fantasia M.D.   On: 08/25/2020 09:48    ____________________________________________    PROCEDURES  Procedure(s) performed:    Procedures    Medications - No data to display   ____________________________________________   INITIAL IMPRESSION / ASSESSMENT AND PLAN / ED COURSE  Pertinent labs & imaging results that were available during my care of the patient were reviewed by me and considered in my medical decision making (see chart for details).  Review of the [place] CSRS was performed in accordance of the [place] prior to dispensing any controlled drugs.     Patient presented to emergency department for evaluation after fall 2 weeks ago.  Vital signs and exam are reassuring.  No hip fracture on x-ray per radiology.  We discussed that an x-ray may miss a fracture and I recommended to further evaluate with a CT scan but patient declines.  He is ambulatory and weightbearing today.  He would like to follow-up with primary care and does not want to do a CT today.  Patient will be discharged home with prescriptions for tramadol and Lidoderm. Patient is to follow up with primary care as directed. Patient is given ED precautions to return to the ED for any worsening or new symptoms.   Nathan [name] was evaluated in Emergency Department on 08/25/2020 for the symptoms described in the history of present illness. He was evaluated in  the context of the global COVID-19 pandemic, which necessitated consideration that the patient might be at risk for infection with the SARS-CoV-2 virus that causes COVID-19. Institutional protocols and algorithms that pertain to the evaluation of patients at risk for COVID-19 are in a state of rapid change based on information released by regulatory bodies including the CDC and federal and state organizations. These policies and algorithms were followed during the  patient's care in the ED.  ____________________________________________  FINAL CLINICAL IMPRESSION(S) / ED DIAGNOSES  Final diagnoses:  Right hip pain      NEW MEDICATIONS STARTED DURING THIS VISIT:  ED Discharge Orders         Ordered    lidocaine (LIDODERM) 5 %  Every 12 hours        08/25/20 1136    traMADol (ULTRAM) 50 MG tablet  Every 6 hours PRN        08/25/20 1136              This chart was dictated using voice recognition software/Dragon. Despite best efforts to proofread, errors can occur which can change the meaning. Any change was purely unintentional.    Laban Emperor, PA-C 08/25/20 1726    Vladimir Crofts, MD 08/26/20 [phone]

## 2020-08-25 NOTE — ED Triage Notes (Signed)
Patient to ER for c/o right hip pain after fall two weeks ago. Patient ambulatory to triage, but with walking stick. Does not normally use ambulation assistance.

## 2020-09-02 ENCOUNTER — Other Ambulatory Visit: Payer: Self-pay

## 2020-09-02 ENCOUNTER — Ambulatory Visit (INDEPENDENT_AMBULATORY_CARE_PROVIDER_SITE_OTHER): Payer: Self-pay

## 2020-09-02 DIAGNOSIS — M7989 Other specified soft tissue disorders: Secondary | ICD-10-CM

## 2020-09-02 DIAGNOSIS — I429 Cardiomyopathy, unspecified: Secondary | ICD-10-CM

## 2020-09-02 LAB — ECHOCARDIOGRAM COMPLETE
AR max vel: 2.66 cm2
AV Area VTI: 2.65 cm2
AV Peak grad: 4.9 mmHg
Ao pk vel: 1.11 m/s
S' Lateral: 3.5 cm

## 2020-09-03 ENCOUNTER — Other Ambulatory Visit: Payer: Self-pay | Admitting: Pharmacist

## 2020-09-03 NOTE — Telephone Encounter (Signed)
59yo Dr Rockey Situ last [place] on 07/30/2020 Dx. Atrial fibrillation Wt = 72.6kg Scr = 2.69 on 07/21/2020  Per Dr. Rockey Situ patient should f/u with nephrologist.   Please repeat BMET prior to next Eliquis refill authorization

## 2020-09-05 ENCOUNTER — Encounter: Payer: Self-pay | Admitting: Nurse Practitioner

## 2020-09-05 ENCOUNTER — Ambulatory Visit (INDEPENDENT_AMBULATORY_CARE_PROVIDER_SITE_OTHER): Payer: Self-pay | Admitting: Nurse Practitioner

## 2020-09-05 ENCOUNTER — Other Ambulatory Visit: Payer: Self-pay

## 2020-09-05 VITALS — BP 148/70 | HR 62 | Ht 69.0 in | Wt 172.0 lb

## 2020-09-05 DIAGNOSIS — I429 Cardiomyopathy, unspecified: Secondary | ICD-10-CM

## 2020-09-05 DIAGNOSIS — I10 Essential (primary) hypertension: Secondary | ICD-10-CM

## 2020-09-05 DIAGNOSIS — I480 Paroxysmal atrial fibrillation: Secondary | ICD-10-CM

## 2020-09-05 DIAGNOSIS — N183 Chronic kidney disease, stage 3 unspecified: Secondary | ICD-10-CM

## 2020-09-05 DIAGNOSIS — I5043 Acute on chronic combined systolic (congestive) and diastolic (congestive) heart failure: Secondary | ICD-10-CM

## 2020-09-05 NOTE — Patient Instructions (Signed)
Medication Instructions:  RESUME Torsemide.  Continue your medications as prescribed.  *If you need a refill on your cardiac medications before your next appointment, please call your pharmacy*   Lab Work: Your physician recommends that you return for lab work (bmet) next week. Please have your lab drawn at the medical mall.  You do not need an appt. Lab hours are Mon-Fri 7am-6pm  If you have labs (blood work) drawn today and your tests are completely normal, you will receive your results only by: Marland Kitchen MyChart Message (if you have MyChart) OR . A paper copy in the mail If you have any lab test that is abnormal or we need to change your treatment, we will call you to review the results.   Testing/Procedures: None ordered   Follow-Up: At [facility], you and your health needs are our priority.  As part of our continuing mission to provide you with exceptional heart care, we have created designated Provider Care Teams.  These Care Teams include your primary Cardiologist (physician) and Advanced Practice Providers (APPs -  Physician Assistants and Nurse Practitioners) who all work together to provide you with the care you need, when you need it.  We recommend signing up for the patient portal called "MyChart".  Sign up information is provided on this After Visit Summary.  MyChart is used to connect with patients for Virtual Visits (Telemedicine).  Patients are able to view lab/test results, encounter notes, upcoming appointments, etc.  Non-urgent messages can be sent to your provider as well.   To learn more about what you can do with MyChart, go to [email or web address].    Your next appointment:   4 week(s)  The format for your next appointment:   In Person  Provider:   You may see Ida Rogue, MD or one of the following Advanced Practice Providers on your designated Care Team:    Murray Hodgkins, NP  Christell Faith, PA-C  Marrianne Mood, PA-C  Cadence [place],  [place]  Laurann [place], NP    Other Instructions N/A

## 2020-09-05 NOTE — Progress Notes (Signed)
Office Visit    Patient Name: Nathan [name] Date of Encounter: 09/05/2020  Primary Care Provider:  Derinda Late, MD Primary Cardiologist:  Ida Rogue, MD  Chief Complaint    59 y/o ? w/ a h/o heart failure with midrange ejection fraction/chronic combined systolic diastolic congestive heart failure, cardiomyopathy of uncertain etiology, paroxysmal atrial fibrillation, left hydropneumothorax status post VATS in October 2020, insulin-dependent diabetes mellitus, stage III chronic kidney disease, MRSA bacteremia, anemia of chronic disease, hypertension, tobacco abuse, and hypokalemia, who presents for follow-up related to volume excess.   Past Medical History    Past Medical History:  Diagnosis Date  . Anemia of chronic disease   . Chronic combined systolic (congestive) and diastolic (congestive) heart failure ([place])    a. 05/2019 Echo: EF 45-50%, nl LA/RA size. Mild to mod TR.  . CKD (chronic kidney disease), stage III ([place])   . Diabetes mellitus without complication ([place])   . Hypertension   . Left Hydropneumothorax/Fibrinopurulent empyema    a. 06/2019 s/p VATS.  Marland Kitchen PAF (paroxysmal atrial fibrillation) ([place])    a. 05/2019 in setting of sepsis-->s/p TEE/DCCV; b. CHA2DS2VASc = 2-->Eliquis.  . Sepsis ([place])    a. 05/2019 MRSA in setting of infected R thumb.   Past Surgical History:  Procedure Laterality Date  . HAND SURGERY Right   . I & D EXTREMITY Right 06/11/2019   Procedure: IRRIGATION AND DEBRIDEMENT RIGHT THUMB;  Surgeon: Dereck Leep, MD;  Location: [facility];  Service: Orthopedics;  Laterality: Right;  . TEE WITHOUT CARDIOVERSION N/A 06/14/2019   Procedure: TRANSESOPHAGEAL ECHOCARDIOGRAM (TEE);  Surgeon: Minna Merritts, MD;  Location: [facility];  Service: Cardiovascular;  Laterality: N/A;  . VIDEO ASSISTED THORACOSCOPY (VATS)/THOROCOTOMY Left 06/26/2019   Procedure: VIDEO ASSISTED THORACOSCOPY DECORTICATION;  Surgeon: Lajuana Matte, MD;  Location: [facility];   Service: Thoracic;  Laterality: Left;  Marland Kitchen VIDEO BRONCHOSCOPY N/A 06/26/2019   Procedure: VIDEO BRONCHOSCOPY;  Surgeon: Lajuana Matte, MD;  Location: [facility];  Service: Thoracic;  Laterality: N/A;    Allergies  No Known Allergies  History of Present Illness    59 year old male with a history of heart failure with midrange ejection fraction/chronic combined systolic and diastolic congestive heart failure, cardiomyopathy of uncertain etiology, paroxysmal atrial fibrillation (CHA2DS2-VASc equals 4), left hydropneumothorax status post VATS in October 2020, insulin-dependent diabetes mellitus, stage III chronic kidney disease, MRSA bacteremia, anemia of chronic disease, hypertension, tobacco abuse, and hypokalemia.  Atrial fibrillation history dates back to early 2000 and he was initially managed with aspirin, though this was subsequently discontinued secondary to headaches.  He was lost to follow-up until September 2020, when he was admitted with right hand cellulitis requiring I&D.  This was complicated by MRSA bacteremia.  Transthoracic echocardiogram during admission showed an EF of 45 to 50% with mild mitral regurgitation, and mild to moderate tricuspid regurgitation.  This was followed by TEE which did not show any evidence of valvular vegetation.  EF by TEE was 50-55%.  Following discharge, he had worsening bilateral lower extremity swelling and anasarca with dyspnea.  He required readmission and aggressive diuresis.  CT of his chest during admission on June 21, 2019, showed left-sided empyema with bilateral lower lobe consolidation and groundglass opacities consistent with infectious process and likely reactive mediastinal and hilar adenopathy.  He required chest tube placement in the setting of left hydropneumothorax and was subsequent treated with VATS.  He required readmission in December 2020 secondary to noncompliance of medications and  volume overload.  Over the course the past year, he has  required multiple adjustments to torsemide dosing due to lower extremity edema and chronic kidney disease.  He was seen in September with 26 pound weight gain since April and significant edema on examination.  Metolazone was added to his regimen and then torsemide was subsequently increased to 40 mg daily in September given lack of significant response to 10 mg daily.  He had 10 pound weight loss on the higher torsemide and metolazone dosing.  Follow-up labs November 1, showed a rise in BUN and creatinine to 92 and 2.69, and metolazone was held.  He was also noted to have a rise in his alkaline phosphatase 960 (was 172 in May 2021 and 1048 in February 2021).  He was referred to nephrology and advised to follow-up with primary care regarding alkaline phosphatase.  He has yet to do either.  He was seen in the emergency department on December 6 following a fall in which he hurt his right hip.  No acute findings on imaging.  Since then, he has had hip discomfort which is slowly improving.  In the setting of hip pain, he has not been taking his torsemide for at least a week as it hurt too much to walk to the bathroom.  He says that he does not feel like his fluid is up and his dyspnea is relatively unchanged, though he notes his activity is much less.  His weight is up 12 pounds today however and he is markedly volume overloaded.  He has been sleeping in his recliner though says he has been doing this because of hip pain not because of orthopnea.  He denies chest pain, palpitations, PND, dizziness, syncope, or early satiety.  Home Medications    Prior to Admission medications   Medication Sig Start Date End Date Taking? Authorizing Provider  apixaban (ELIQUIS) 5 MG TABS tablet Take 1 tablet (5 mg total) by mouth 2 (two) times daily. 09/03/19   Dunn, Areta Haber, PA-C  atorvastatin (LIPITOR) 40 MG tablet Take 40 mg by mouth daily. 04/03/20   [provider]  carvedilol (COREG) 25 MG tablet Take 1 tablet (25 mg  total) by mouth 2 (two) times daily. 09/19/19 07/21/20  Verta Ellen., NP  ferrous sulfate 325 (65 FE) MG tablet Take 1 tablet (325 mg total) by mouth 2 (two) times daily with a meal. Patient taking differently: Take 325 mg by mouth daily with breakfast.  06/30/19   Regalado, Belkys A, MD  glipiZIDE (GLUCOTROL XL) 2.5 MG 24 hr tablet Take 2.5 mg by mouth daily with breakfast.  06/09/20 06/09/21  [provider]  hydrALAZINE (APRESOLINE) 50 MG tablet Take 1 tablet (50 mg total) by mouth 3 (three) times daily. 07/21/20   Minna Merritts, MD  isosorbide mononitrate (IMDUR) 30 MG 24 hr tablet Take 1 tablet (30 mg total) by mouth 2 (two) times daily. 07/21/20   Minna Merritts, MD  lidocaine (LIDODERM) 5 % Place 1 patch onto the skin every 12 (twelve) hours. Remove & Discard patch within 12 hours or as directed by MD 08/25/20 08/25/21  Laban Emperor, PA-C  metolazone (ZAROXOLYN) 2.5 MG tablet Take 1 tablet (2.5 mg total) 30 mins prior to Lasix dose three days a week. 06/03/20   Minna Merritts, MD  Multiple Vitamin (MULTIVITAMIN) tablet Take 1 tablet by mouth daily.    [provider]  Potassium Chloride ER 20 MEQ TBCR Take 20 mEq by  mouth every Monday, Wednesday, and Friday. 01/25/20 07/21/20  Minna Merritts, MD  torsemide (DEMADEX) 20 MG tablet Take 2 tablets (40 mg total) by mouth daily. 07/21/20   Minna Merritts, MD  traMADol (ULTRAM) 50 MG tablet Take 1 tablet (50 mg total) by mouth every 6 (six) hours as needed. 08/25/20 08/25/21  Laban Emperor, PA-C    Review of Systems    Right hip pain following fall December 6.  He says that dyspnea on exertion and edema are stable.  He denies chest pain, palpitations, PND, orthopnea, dizziness, syncope, or early satiety.  All other systems reviewed and are otherwise negative except as noted above.  Physical Exam    VS:  BP (!) 148/70 (BP Location: Right Arm, Patient Position: Sitting, Cuff Size: Normal)   Pulse 62   Ht 5' 9"  (1.753  m)   Wt 172 lb (78 kg)   SpO2 96%   BMI 25.40 kg/m  , BMI Body mass index is 25.4 kg/m. GEN: Well nourished, well developed, in no acute distress. HEENT: normal. Neck: Supple, moderately elevated JVP, no carotid bruits, or masses. Cardiac: RRR, no murmurs, rubs, or gallops. No clubbing, cyanosis.  3+ bilateral lower extremity edema to the mid thighs.  Radials/PT 2+ and equal bilaterally.  Respiratory:  Respirations regular and unlabored, clear to auscultation bilaterally. GI: Soft, nontender, nondistended, BS + x 4.  Bilateral 2+ flank edema noted. MS: no deformity or atrophy. Skin: warm and dry, no rash. Neuro:  Strength and sensation are intact. Psych: Normal affect.  Accessory Clinical Findings    Lab Results  Component Value Date   WBC 1.9 (L) 07/21/2020   HGB 8.2 (L) 07/21/2020   HCT 26.2 (L) 07/21/2020   MCV 96.0 07/21/2020   PLT 343 07/21/2020   Lab Results  Component Value Date   CREATININE 2.69 (H) 07/21/2020   BUN 92 (H) 07/21/2020   NA 141 07/21/2020   K 4.9 07/21/2020   CL 114 (H) 07/21/2020   CO2 20 (L) 07/21/2020   Lab Results  Component Value Date   ALT 59 (H) 07/21/2020   AST 32 07/21/2020   GGT 264 (H) 01/21/2020   ALKPHOS 960 (H) 07/21/2020   BILITOT 0.8 07/21/2020   Lab Results  Component Value Date   CHOL 138 06/26/2019   HDL 39 (L) 06/26/2019   LDLCALC 75 06/26/2019   TRIG 119 06/26/2019   CHOLHDL 3.5 06/26/2019    Lab Results  Component Value Date   HGBA1C 6.9 (H) 01/21/2020    Assessment & Plan    1.  Acute on chronic combined systolic and diastolic congestive heart failure/cardiomyopathy of uncertain etiology: EF 45 to 50% by echo in September 2020 (50-55% by TEE).  Earlier this fall, he required significant titration of torsemide up to 40 mg daily and at 1 point he was taking metolazone but this was discontinued after lab work November 1 showed worsening renal insufficiency with a BUN of 92 and a creatinine of 2.69.  He is also noted  to have an alkaline phosphatase of 960.  Patient was told to continue torsemide but over the past week or so, he has not taken any secondary to hip pain following a fall (evaluated in ED December 6 without acute findings).  He is volume overloaded today and his weight is above weights earlier in the fall though he says symptoms seem to be stable at home.  Have encouraged him to resume torsemide 40 mg daily and will  plan follow-up basic metabolic panel next week.  He has yet to follow-up with nephrology I have encouraged him to arrange appointment.  Referral previously made in November.  Continue beta-blocker, hydralazine, nitrate.  He understands that if weight continues to rise/volume status worsens, and/or he becomes more dyspneic, he will likely require hospital admission for IV diuresis and close monitoring of renal function.  2.  Stage III chronic kidney disease: BUN and creatinine up to 92/2.69 on November 1 in the setting of torsemide 40 mg daily and metolazone 2.5 mg every other day.  Metolazone was stopped after those findings and as above, he has been off of torsemide for at least a week.  Resuming torsemide in the setting of volume excess with plan for follow-up basic metabolic panel next week.  Again advised that he f/u w/ nephrology.  3.  History of exertional chest pain and coronary artery calcifications on CT: No recent chest pain.  He has not undergone ischemic testing or invasive coronary evaluation secondary to lack of insurance and chronic kidney disease.  He remains on beta-blocker, statin, and nitrate therapy.  4.  Paroxysmal atrial fibrillation: Denies palpitations.  Regular on exam today.  He remains on beta-blocker and Eliquis.  Though creatinine is elevated, he is less than 30 years old with a weight of greater than 60 kg and thus remains on 5 mg twice daily.  5.  Essential hypertension: Blood pressure moderately elevated today off of torsemide.  Resume.  6.  Type 2 diabetes  mellitus: A1c was 6.9 in May of this year.  He is on glipizide and followed by primary care.  7.  Elevated Alk Phos:  Encouraged him to f/u w/ PCP.  8.  Disposition: Follow-up basic metabolic panel 1 week.  He prefers to hold off on office follow-up for at least a month but knows to contact us sooner if necessary.   Murray Hodgkins, NP 09/05/2020, 4:19 PM

## 2020-10-09 ENCOUNTER — Ambulatory Visit: Payer: Self-pay | Admitting: Nurse Practitioner

## 2020-11-05 ENCOUNTER — Other Ambulatory Visit: Payer: Self-pay | Admitting: Nephrology

## 2020-11-05 DIAGNOSIS — E1122 Type 2 diabetes mellitus with diabetic chronic kidney disease: Secondary | ICD-10-CM

## 2020-11-05 DIAGNOSIS — R8290 Unspecified abnormal findings in urine: Secondary | ICD-10-CM

## 2020-11-05 DIAGNOSIS — D631 Anemia in chronic kidney disease: Secondary | ICD-10-CM

## 2020-11-05 DIAGNOSIS — R809 Proteinuria, unspecified: Secondary | ICD-10-CM

## 2020-11-05 DIAGNOSIS — N184 Chronic kidney disease, stage 4 (severe): Secondary | ICD-10-CM

## 2020-12-03 ENCOUNTER — Other Ambulatory Visit: Payer: Self-pay

## 2020-12-03 ENCOUNTER — Inpatient Hospital Stay: Payer: Self-pay

## 2020-12-03 ENCOUNTER — Inpatient Hospital Stay
Admission: EM | Admit: 2020-12-03 | Discharge: 2020-12-04 | DRG: 683 | Disposition: A | Discharge status: Home / Self Care | Payer: Self-pay | Attending: Internal Medicine | Admitting: Internal Medicine

## 2020-12-03 ENCOUNTER — Emergency Department: Payer: Self-pay

## 2020-12-03 DIAGNOSIS — Z833 Family history of diabetes mellitus: Secondary | ICD-10-CM

## 2020-12-03 DIAGNOSIS — N2581 Secondary hyperparathyroidism of renal origin: Secondary | ICD-10-CM | POA: Diagnosis present

## 2020-12-03 DIAGNOSIS — N189 Chronic kidney disease, unspecified: Secondary | ICD-10-CM

## 2020-12-03 DIAGNOSIS — I5023 Acute on chronic systolic (congestive) heart failure: Secondary | ICD-10-CM

## 2020-12-03 DIAGNOSIS — Z7901 Long term (current) use of anticoagulants: Secondary | ICD-10-CM

## 2020-12-03 DIAGNOSIS — I130 Hypertensive heart and chronic kidney disease with heart failure and stage 1 through stage 4 chronic kidney disease, or unspecified chronic kidney disease: Secondary | ICD-10-CM | POA: Diagnosis present

## 2020-12-03 DIAGNOSIS — N179 Acute kidney failure, unspecified: Principal | ICD-10-CM | POA: Diagnosis present

## 2020-12-03 DIAGNOSIS — I5042 Chronic combined systolic (congestive) and diastolic (congestive) heart failure: Secondary | ICD-10-CM | POA: Diagnosis present

## 2020-12-03 DIAGNOSIS — N184 Chronic kidney disease, stage 4 (severe): Secondary | ICD-10-CM | POA: Diagnosis present

## 2020-12-03 DIAGNOSIS — E1151 Type 2 diabetes mellitus with diabetic peripheral angiopathy without gangrene: Secondary | ICD-10-CM | POA: Diagnosis present

## 2020-12-03 DIAGNOSIS — I480 Paroxysmal atrial fibrillation: Secondary | ICD-10-CM | POA: Diagnosis present

## 2020-12-03 DIAGNOSIS — Z7984 Long term (current) use of oral hypoglycemic drugs: Secondary | ICD-10-CM

## 2020-12-03 DIAGNOSIS — D638 Anemia in other chronic diseases classified elsewhere: Secondary | ICD-10-CM | POA: Diagnosis present

## 2020-12-03 DIAGNOSIS — I5022 Chronic systolic (congestive) heart failure: Secondary | ICD-10-CM

## 2020-12-03 DIAGNOSIS — N049 Nephrotic syndrome with unspecified morphologic changes: Secondary | ICD-10-CM

## 2020-12-03 DIAGNOSIS — Z20822 Contact with and (suspected) exposure to covid-19: Secondary | ICD-10-CM | POA: Diagnosis present

## 2020-12-03 DIAGNOSIS — E1122 Type 2 diabetes mellitus with diabetic chronic kidney disease: Secondary | ICD-10-CM

## 2020-12-03 DIAGNOSIS — Z79899 Other long term (current) drug therapy: Secondary | ICD-10-CM

## 2020-12-03 DIAGNOSIS — D631 Anemia in chronic kidney disease: Secondary | ICD-10-CM | POA: Diagnosis present

## 2020-12-03 DIAGNOSIS — I10 Essential (primary) hypertension: Secondary | ICD-10-CM

## 2020-12-03 LAB — BASIC METABOLIC PANEL
Anion gap: 7 (ref 5–15)
BUN: 93 mg/dL — ABNORMAL HIGH (ref 6–20)
CO2: 24 mmol/L (ref 22–32)
Calcium: 7.9 mg/dL — ABNORMAL LOW (ref 8.9–10.3)
Chloride: 111 mmol/L (ref 98–111)
Creatinine, Ser: 3.2 mg/dL — ABNORMAL HIGH (ref 0.61–1.24)
GFR, Estimated: 22 mL/min — ABNORMAL LOW (ref >60)
Glucose, Bld: 126 mg/dL — ABNORMAL HIGH (ref 70–99)
Potassium: 3.6 mmol/L (ref 3.5–5.1)
Sodium: 142 mmol/L (ref 135–145)

## 2020-12-03 LAB — URINALYSIS, COMPLETE (UACMP) WITH MICROSCOPIC
Bilirubin Urine: NEGATIVE
Glucose, UA: 150 mg/dL — AB
Hgb urine dipstick: NEGATIVE
Ketones, ur: NEGATIVE mg/dL
Leukocytes,Ua: NEGATIVE
Nitrite: NEGATIVE
Protein, ur: >=300 mg/dL — AB
Specific Gravity, Urine: 1.013 (ref 1.005–1.030)
pH: 5 (ref 5.0–8.0)

## 2020-12-03 LAB — CBC
HCT: 23.2 % — ABNORMAL LOW (ref 39.0–52.0)
Hemoglobin: 7.5 g/dL — ABNORMAL LOW (ref 13.0–17.0)
MCH: 31.3 pg (ref 26.0–34.0)
MCHC: 32.3 g/dL (ref 30.0–36.0)
MCV: 96.7 fL (ref 80.0–100.0)
Platelets: 302 10*3/uL (ref 150–400)
RBC: 2.4 MIL/uL — ABNORMAL LOW (ref 4.22–5.81)
RDW: 12.5 % (ref 11.5–15.5)
WBC: 7.5 10*3/uL (ref 4.0–10.5)
nRBC: 0 % (ref 0.0–0.2)

## 2020-12-03 LAB — RESP PANEL BY RT-PCR (FLU A&B, COVID) ARPGX2
Influenza A by PCR: NEGATIVE
Influenza B by PCR: NEGATIVE
SARS Coronavirus 2 by RT PCR: NEGATIVE

## 2020-12-03 LAB — HEMOGLOBIN A1C
Hgb A1c MFr Bld: 6 % — ABNORMAL HIGH (ref 4.8–5.6)
Mean Plasma Glucose: 125.5 mg/dL

## 2020-12-03 LAB — IRON AND TIBC
Iron: 30 ug/dL — ABNORMAL LOW (ref 45–182)
Saturation Ratios: 11 % — ABNORMAL LOW (ref 17.9–39.5)
TIBC: 287 ug/dL (ref 250–450)
UIBC: 257 ug/dL

## 2020-12-03 LAB — CBG MONITORING, ED
Glucose-Capillary: 107 mg/dL — ABNORMAL HIGH (ref 70–99)
Glucose-Capillary: 129 mg/dL — ABNORMAL HIGH (ref 70–99)

## 2020-12-03 LAB — PROTIME-INR
INR: 1.2 (ref 0.8–1.2)
Prothrombin Time: 14.4 seconds (ref 11.4–15.2)

## 2020-12-03 LAB — HIV ANTIBODY (ROUTINE TESTING W REFLEX): HIV Screen 4th Generation wRfx: NONREACTIVE

## 2020-12-03 MED ORDER — ADULT MULTIVITAMIN W/MINERALS CH
1.0000 | ORAL_TABLET | Freq: Every day | ORAL | Status: DC
  Administered 2020-12-04: 1 via ORAL
  Filled 2020-12-03: qty 1

## 2020-12-03 MED ORDER — ISOSORBIDE MONONITRATE ER 60 MG PO TB24
30.0000 mg | ORAL_TABLET | Freq: Two times a day (BID) | ORAL | Status: DC
  Administered 2020-12-03 – 2020-12-04 (×2): 30 mg via ORAL
  Filled 2020-12-03 (×2): qty 1

## 2020-12-03 MED ORDER — TRAMADOL HCL 50 MG PO TABS: 50.0000 mg | ORAL_TABLET | Freq: Two times a day (BID) | ORAL | Status: DC | PRN

## 2020-12-03 MED ORDER — POTASSIUM CHLORIDE ER 20 MEQ PO TBCR: 20.0000 meq | EXTENDED_RELEASE_TABLET | ORAL | Status: DC

## 2020-12-03 MED ORDER — SODIUM CHLORIDE 0.9% FLUSH: 3.0000 mL | INTRAVENOUS | Status: DC | PRN

## 2020-12-03 MED ORDER — CARVEDILOL 25 MG PO TABS
25.0000 mg | ORAL_TABLET | Freq: Two times a day (BID) | ORAL | Status: DC
  Administered 2020-12-04: 25 mg via ORAL
  Filled 2020-12-03: qty 4

## 2020-12-03 MED ORDER — ONDANSETRON HCL 4 MG PO TABS: 4.0000 mg | ORAL_TABLET | Freq: Four times a day (QID) | ORAL | Status: DC | PRN

## 2020-12-03 MED ORDER — POTASSIUM CHLORIDE CRYS ER 20 MEQ PO TBCR: 20.0000 meq | EXTENDED_RELEASE_TABLET | ORAL | Status: DC

## 2020-12-03 MED ORDER — FERROUS SULFATE 325 (65 FE) MG PO TABS
325.0000 mg | ORAL_TABLET | Freq: Every day | ORAL | Status: DC
  Administered 2020-12-04: 325 mg via ORAL
  Filled 2020-12-03: qty 1

## 2020-12-03 MED ORDER — SODIUM CHLORIDE 0.9% FLUSH
3.0000 mL | Freq: Two times a day (BID) | INTRAVENOUS | Status: DC
  Administered 2020-12-03: 3 mL via INTRAVENOUS

## 2020-12-03 MED ORDER — SODIUM CHLORIDE 0.9 % IV SOLN: 250.0000 mL | INTRAVENOUS | Status: DC | PRN

## 2020-12-03 MED ORDER — HYDRALAZINE HCL 50 MG PO TABS
50.0000 mg | ORAL_TABLET | Freq: Three times a day (TID) | ORAL | Status: DC
  Administered 2020-12-03 – 2020-12-04 (×2): 50 mg via ORAL
  Filled 2020-12-03 (×2): qty 1

## 2020-12-03 MED ORDER — ONDANSETRON HCL 4 MG/2ML IJ SOLN: 4.0000 mg | Freq: Four times a day (QID) | INTRAMUSCULAR | Status: DC | PRN

## 2020-12-03 MED ORDER — ATORVASTATIN CALCIUM 20 MG PO TABS
40.0000 mg | ORAL_TABLET | Freq: Every day | ORAL | Status: DC
  Administered 2020-12-04: 40 mg via ORAL
  Filled 2020-12-03: qty 2

## 2020-12-03 MED ORDER — INSULIN ASPART 100 UNIT/ML ~~LOC~~ SOLN: 0.0000 [IU] | Freq: Three times a day (TID) | SUBCUTANEOUS | Status: DC

## 2020-12-03 MED ORDER — FUROSEMIDE 10 MG/ML IJ SOLN
4.0000 mg/h | INTRAVENOUS | Status: DC
  Administered 2020-12-03: 4 mg/h via INTRAVENOUS
  Filled 2020-12-03: qty 20

## 2020-12-03 NOTE — ED Notes (Signed)
Admission MD at bedside.  

## 2020-12-03 NOTE — ED Provider Notes (Signed)
[facility] Emergency Department Provider Note  ____________________________________________   Event Date/Time   First MD Initiated Contact with Patient 12/03/20 1513     (approximate)  I have reviewed the triage vital signs and the nursing notes.   HISTORY  Chief Complaint Leg Swelling and abd distention    HPI Nathan [name] is a 60 y.o. male with CKD, heart failure, diabetes, paroxysmal A. fib in the setting of sepsis who comes in for leg swelling.  Patient states he has had constant leg swelling for 6 months, nothing makes it better including his torsemide 40 mg daily, nothing makes it worse.  States that the swelling is made it more difficult for him to ambulate and is feeling weak and tired.  He states that this is been going on for weeks now.  He is unsure why his nephrologist wanted him to come to the ER today for admission.  Patient is supposed been Eliquis but has not taken it for a week.   On review of records patient was seen by [facility] by Dr. Lanora Manis.  Patient went in there due to feeling weak and tired and not been able to ambulate with swelling all over his body.  Patient has been on torsemide but not able to urinate much.  According to their note patient needs hospital admission for serological work-up and renal biopsy          Past Medical History:  Diagnosis Date  . Anemia of chronic disease   . Chronic combined systolic (congestive) and diastolic (congestive) heart failure ([place])    a. 05/2019 Echo: EF 45-50%, nl LA/RA size. Mild to mod TR.  . CKD (chronic kidney disease), stage III ([place])   . Diabetes mellitus without complication ([place])   . Hypertension   . Left Hydropneumothorax/Fibrinopurulent empyema    a. 06/2019 s/p VATS.  Marland Kitchen PAF (paroxysmal atrial fibrillation) ([place])    a. 05/2019 in setting of sepsis-->s/p TEE/DCCV; b. CHA2DS2VASc = 2-->Eliquis.  . Sepsis ([place])    a. 05/2019 MRSA in setting of  infected R thumb.    Patient Active Problem List   Diagnosis Date Noted  . Atrial fibrillation ([place])   . Diabetes mellitus without complication ([place])   . AKI (acute kidney injury) ([place])   . Hydropneumothorax 06/24/2019  . Hypertension 06/24/2019  . Insulin dependent diabetes mellitus type IA ([place]) 06/24/2019  . Anemia of chronic disease 06/24/2019  . Atrial fibrillation, chronic ([place]) 06/24/2019  . Acute on chronic combined systolic and diastolic CHF (congestive heart failure) ([place]) 06/24/2019  . Cellulitis of right hand 06/24/2019  . CKD (chronic kidney disease), stage III ([place])   . Empyema ([place])   . Sepsis ([place]) 06/10/2019    Past Surgical History:  Procedure Laterality Date  . HAND SURGERY Right   . I & D EXTREMITY Right 06/11/2019   Procedure: IRRIGATION AND DEBRIDEMENT RIGHT THUMB;  Surgeon: Dereck Leep, MD;  Location: [facility] ORS;  Service: Orthopedics;  Laterality: Right;  . TEE WITHOUT CARDIOVERSION N/A 06/14/2019   Procedure: TRANSESOPHAGEAL ECHOCARDIOGRAM (TEE);  Surgeon: Minna Merritts, MD;  Location: [facility] ORS;  Service: Cardiovascular;  Laterality: N/A;  . VIDEO ASSISTED THORACOSCOPY (VATS)/THOROCOTOMY Left 06/26/2019   Procedure: VIDEO ASSISTED THORACOSCOPY DECORTICATION;  Surgeon: Lajuana Matte, MD;  Location: [place];  Service: Thoracic;  Laterality: Left;  Marland Kitchen VIDEO BRONCHOSCOPY N/A 06/26/2019   Procedure: VIDEO BRONCHOSCOPY;  Surgeon: Lajuana Matte, MD;  Location: [place];  Service:  Thoracic;  Laterality: N/A;    Prior to Admission medications   Medication Sig Start Date End Date Taking? Authorizing Provider  apixaban (ELIQUIS) 5 MG TABS tablet Take 1 tablet (5 mg total) by mouth 2 (two) times daily. 09/03/19   Dunn, Areta Haber, PA-C  atorvastatin (LIPITOR) 40 MG tablet Take 40 mg by mouth daily. 04/03/20   [provider]  carvedilol (COREG) 25 MG tablet Take 1 tablet (25 mg total) by mouth 2 (two) times daily. 09/19/19 07/21/20  Verta Ellen.,  NP  ferrous sulfate 325 (65 FE) MG tablet Take 1 tablet (325 mg total) by mouth 2 (two) times daily with a meal. Patient taking differently: Take 325 mg by mouth daily with breakfast. 06/30/19   Regalado, Belkys A, MD  glipiZIDE (GLUCOTROL XL) 2.5 MG 24 hr tablet Take 2.5 mg by mouth daily with breakfast.  06/09/20 06/09/21  [provider]  hydrALAZINE (APRESOLINE) 50 MG tablet Take 1 tablet (50 mg total) by mouth 3 (three) times daily. 07/21/20   Minna Merritts, MD  isosorbide mononitrate (IMDUR) 30 MG 24 hr tablet Take 1 tablet (30 mg total) by mouth 2 (two) times daily. 07/21/20   Minna Merritts, MD  lidocaine (LIDODERM) 5 % Place 1 patch onto the skin every 12 (twelve) hours. Remove & Discard patch within 12 hours or as directed by MD 08/25/20 08/25/21  Laban Emperor, PA-C  Multiple Vitamin (MULTIVITAMIN) tablet Take 1 tablet by mouth daily.    [provider]  Potassium Chloride ER 20 MEQ TBCR Take 20 mEq by mouth every Monday, Wednesday, and Friday. 01/25/20 07/21/20  Minna Merritts, MD  torsemide (DEMADEX) 20 MG tablet Take 2 tablets (40 mg total) by mouth daily. 07/21/20   Minna Merritts, MD  traMADol (ULTRAM) 50 MG tablet Take 1 tablet (50 mg total) by mouth every 6 (six) hours as needed. 08/25/20 08/25/21  Laban Emperor, PA-C    Allergies Patient has no known allergies.  Family History  Problem Relation Age of Onset  . CAD Father   . Diabetes Sister   . Diabetes Brother   . Diabetes Sister   . Diabetes Sister     Social History Social History   Tobacco Use  . Smoking status: Never Smoker  . Smokeless tobacco: Never Used  Vaping Use  . Vaping Use: Never used  Substance Use Topics  . Alcohol use: Never  . Drug use: Never      Review of Systems Constitutional: No fever/chills, fatigue, weakness Eyes: No visual changes. ENT: No sore throat. Cardiovascular: no chest pain Respiratory: Denies shortness of breath. Gastrointestinal: No abdominal  pain.  No nausea, no vomiting.  No diarrhea.  No constipation. Genitourinary: Negative for dysuria. Musculoskeletal: Negative for back pain.  Leg swelling Skin: Negative for rash. Neurological: Negative for headaches, focal weakness or numbness. All other ROS negative ____________________________________________   PHYSICAL EXAM:  VITAL SIGNS: ED Triage Vitals  Enc Vitals Group     BP 12/03/20 1329 (!) 145/76     Pulse Rate 12/03/20 1329 68     Resp 12/03/20 1329 18     Temp 12/03/20 1329 97.9 F (36.6 C)     Temp Source 12/03/20 1329 Oral     SpO2 12/03/20 1329 97 %     Weight 12/03/20 1332 174 lb (78.9 kg)     Height 12/03/20 1332 '5\' 9"'$  (1.753 m)     Head Circumference --  Peak Flow --      Pain Score 12/03/20 1332 0     Pain Loc --      Pain Edu? --      Excl. in [place]? --     Constitutional: Alert and oriented. Well appearing and in no acute distress. Eyes: Conjunctivae are normal. EOMI. Head: Atraumatic. Nose: No congestion/rhinnorhea. Mouth/Throat: Mucous membranes are moist.   Neck: No stridor. Trachea Midline. FROM Cardiovascular: Normal rate, regular rhythm. Grossly normal heart sounds.  Good peripheral circulation. Respiratory: Normal respiratory effort.  No retractions. Lungs CTAB. Gastrointestinal: Soft and nontender. No distention. No abdominal bruits.  Musculoskeletal: 2+ edema bilaterally.  No joint effusions. Neurologic:  Normal speech and language. No gross focal neurologic deficits are appreciated.  Skin:  Skin is warm, dry and intact. No rash noted. Psychiatric: Mood and affect are normal. Speech and behavior are normal. GU: Deferred   ____________________________________________   LABS (all labs ordered are listed, but only abnormal results are displayed)  Labs Reviewed  BASIC METABOLIC PANEL - Abnormal; Notable for the following components:      Result Value   Glucose, Bld 126 (*)    BUN 93 (*)    Creatinine, Ser 3.20 (*)    Calcium 7.9  (*)    GFR, Estimated 22 (*)    All other components within normal limits  CBC - Abnormal; Notable for the following components:   RBC 2.40 (*)    Hemoglobin 7.5 (*)    HCT 23.2 (*)    All other components within normal limits  PROTIME-INR   ____________________________________________   ED ECG REPORT I, Vanessa Upton, the attending physician, personally viewed and interpreted this ECG.  Normal sinus rate of 70, no ST elevation, no T wave inversions except for V2, PAC, QTC of 492, incomplete right bundle branch block ____________________________________________  RADIOLOGY I, Vanessa [place], personally viewed and evaluated these images (plain radiographs) as part of my medical decision making, as well as reviewing the written report by the radiologist.  ED MD interpretation: Small pleural effusion in the left  Official radiology report(s): DG Chest 2 View  Result Date: 12/03/2020 CLINICAL DATA:  60 year old male with history of shortness of breath. EXAM: CHEST - 2 VIEW COMPARISON:  Chest x-ray 06/30/2019. FINDINGS: Chronic left-sided pleural effusion, decreased compared to the prior study, with persistent loculated component in the inferior aspect of the left major fissure (similar to prior examinations). No right pleural effusion. No definite consolidative airspace disease. No pneumothorax. No evidence of pulmonary edema. Heart size is normal. Upper mediastinal contours are within normal limits. Atherosclerotic calcifications in the thoracic aorta. IMPRESSION: 1. Small chronic left pleural effusion partially loculated in the left major fissure, decreased in size compared to prior examinations. 2. Aortic atherosclerosis. Electronically Signed   By: Vinnie Langton M.D.   On: 12/03/2020 14:13    ____________________________________________   PROCEDURES  Procedure(s) performed (including Critical Care):  .Critical Care Performed by: Vanessa [place], MD Authorized by: Vanessa Maxton, MD    Critical care provider statement:    Critical care time (minutes):  35   Critical care was necessary to treat or prevent imminent or life-threatening deterioration of the following conditions:  Renal failure   Critical care was time spent personally by me on the following activities:  Discussions with consultants, evaluation of patient's response to treatment, examination of patient, ordering and performing treatments and interventions, ordering and review of laboratory studies, ordering and review of radiographic studies,  pulse oximetry, re-evaluation of patient's condition, obtaining history from patient or surrogate and review of old charts .1-3 Lead EKG Interpretation Performed by: Vanessa [place], MD Authorized by: Vanessa Yeager, MD     Interpretation: normal     ECG rate:  60s    ECG rate assessment: normal     Rhythm: sinus rhythm     Ectopy: none     Conduction: normal       ____________________________________________   INITIAL IMPRESSION / ASSESSMENT AND PLAN / ED COURSE   Nathan [name] was evaluated in Emergency Department on 12/03/2020 for the symptoms described in the history of present illness. He was evaluated in the context of the global COVID-19 pandemic, which necessitated consideration that the patient might be at risk for infection with the SARS-CoV-2 virus that causes COVID-19. Institutional protocols and algorithms that pertain to the evaluation of patients at risk for COVID-19 are in a state of rapid change based on information released by regulatory bodies including the CDC and federal and state organizations. These policies and algorithms were followed during the patient's care in the ED.    Most Likely DDx:   Patient is a 60 year old with CKD who comes in for admission for further work-up.  Labs were ordered to evaluate for hyperkalemia, kidney function, BUN.  Chest x-ray ordered to evaluate for fluid.  Patient does have a lot of edema in his legs.  Denies  any chest pain or significant shortness of breath therefore unlikely ACS or PE.  We will keep patient on the cardiac monitor due to concern for possible arrhythmia with kidney failure.  Labs show kidney function increasing up to 3.2 with a BUN of 93  his potassium is 3.6  Discussed with nephrology recommended renal ultrasound duplex, Lasix drip at 4 mg/hr, and admission to the hospital team.  Patient will need consultation with IR for renal biopsy hopefully tomorrow   ____________________________________________   FINAL CLINICAL IMPRESSION(S) / ED DIAGNOSES   Final diagnoses:  Nephrotic syndrome  Acute renal failure, unspecified acute renal failure type ([place])     MEDICATIONS GIVEN DURING THIS VISIT:  Medications  furosemide (LASIX) 200 mg in dextrose 5 % 100 mL (2 mg/mL) infusion (has no administration in time range)     ED Discharge Orders    None       Note:  This document was prepared using Dragon voice recognition software and may include unintentional dictation errors.   Vanessa [place], MD 12/03/20 [phone]

## 2020-12-03 NOTE — ED Notes (Signed)
ED Provider at bedside. 

## 2020-12-03 NOTE — ED Notes (Signed)
US at bedside

## 2020-12-03 NOTE — ED Triage Notes (Signed)
Pt states he had follow up with renal doctor today and told to come to the ED with BL LE swelling and labs that were drawn a month ago.

## 2020-12-03 NOTE — H&P (Signed)
History and Physical    Laval Aagaard Hammill [id number] DOB: 1961-07-26 DOA: 12/03/2020  PCP: Derinda Late, MD   Patient coming from: Home  I have personally briefly reviewed patient's old medical records in [place]  Chief Complaint: Leg swelling/abdominal distention  HPI: Nathan [name] is a 60 y.o. male with medical history significant for diabetes mellitus with complications of stage III chronic kidney disease, paroxysmal atrial fibrillation, hypertension and chronic combined systolic and diastolic dysfunction CHF CHF who presents to the emergency room at the request of his nephrologist for evaluation of worsening renal function. Patient states that he has had swelling in both lower extremities for about 6 months and has been compliant with his diuretic therapy without any significant improvement.  He has worsening lower extremity swelling and now has difficulty with ambulation.  He has also noted increased abdominal girth as well as weakness.  He had gone to see his nephrologist who referred him to the ER for further evaluation due to his worsening renal function. He has had reduced urine output despite taking his diuretic therapy as prescribed. He denies having any chest pain, no shortness of breath, no nausea, no vomiting, no abdominal pain, no nocturia, no dysuria, no dizziness, no lightheadedness, no headache, no cough, no fever or chills. Labs show sodium 142, potassium 3.6, chloride 111, bicarb 24, glucose 126, BUN 93, creatinine 3.20 compared to baseline of 2.65, calcium 7.9, white count 7.5, hemoglobin 7.5, hematocrit 23.2, MCV 96.7, RDW 12.5, platelet count 302, PT 14.4, INR 1.2 Respiratory viral panel is negative Chest x-ray reviewed by me shows small chronic left pleural effusion partially loculated in the left major fissure, decreased in size compared to prior examinations. Aortic atherosclerosis. Twelve-lead EKG shows sinus rhythm with incomplete right bundle  branch block and PACs.  ED Course: Patient is a 60 year old Caucasian male with a history significant for diabetes mellitus with complications of stage III chronic kidney disease who was sent to the emergency room by his nephrologist for evaluation of worsening renal function and refractory lower extremity swelling despite compliance with diuretic therapy.  Patient will be admitted to the hospital for further evaluation.    Review of Systems: As per HPI otherwise all other systems reviewed and negative.    Past Medical History:  Diagnosis Date  . Anemia of chronic disease   . Chronic combined systolic (congestive) and diastolic (congestive) heart failure ([place])    a. 05/2019 Echo: EF 45-50%, nl LA/RA size. Mild to mod TR.  . CKD (chronic kidney disease), stage III ([place])   . Diabetes mellitus without complication ([place])   . Hypertension   . Left Hydropneumothorax/Fibrinopurulent empyema    a. 06/2019 s/p VATS.  Marland Kitchen PAF (paroxysmal atrial fibrillation) ([place])    a. 05/2019 in setting of sepsis-->s/p TEE/DCCV; b. CHA2DS2VASc = 2-->Eliquis.  . Sepsis ([place])    a. 05/2019 MRSA in setting of infected R thumb.    Past Surgical History:  Procedure Laterality Date  . HAND SURGERY Right   . I & D EXTREMITY Right 06/11/2019   Procedure: IRRIGATION AND DEBRIDEMENT RIGHT THUMB;  Surgeon: Dereck Leep, MD;  Location: [facility] ORS;  Service: Orthopedics;  Laterality: Right;  . TEE WITHOUT CARDIOVERSION N/A 06/14/2019   Procedure: TRANSESOPHAGEAL ECHOCARDIOGRAM (TEE);  Surgeon: Minna Merritts, MD;  Location: [facility] ORS;  Service: Cardiovascular;  Laterality: N/A;  . VIDEO ASSISTED THORACOSCOPY (VATS)/THOROCOTOMY Left 06/26/2019   Procedure: VIDEO ASSISTED THORACOSCOPY DECORTICATION;  Surgeon: Lajuana Matte, MD;  [facility];  Service: Thoracic;  Laterality: Left;  Marland Kitchen VIDEO BRONCHOSCOPY N/A 06/26/2019   Procedure: VIDEO BRONCHOSCOPY;  Surgeon: Lajuana Matte, MD;  Location: [facility];  Service:  Thoracic;  Laterality: N/A;     reports that he has never smoked. He has never used smokeless tobacco. He reports that he does not drink alcohol and does not use drugs.  No Known Allergies  Family History  Problem Relation Age of Onset  . CAD Father   . Diabetes Sister   . Diabetes Brother   . Diabetes Sister   . Diabetes Sister       Prior to Admission medications   Medication Sig Start Date End Date Taking? Authorizing Provider  apixaban (ELIQUIS) 5 MG TABS tablet Take 1 tablet (5 mg total) by mouth 2 (two) times daily. 09/03/19   Dunn, Areta Haber, PA-C  atorvastatin (LIPITOR) 40 MG tablet Take 40 mg by mouth daily. 04/03/20   [provider]  carvedilol (COREG) 25 MG tablet Take 1 tablet (25 mg total) by mouth 2 (two) times daily. 09/19/19 07/21/20  Verta Ellen., NP  ferrous sulfate 325 (65 FE) MG tablet Take 1 tablet (325 mg total) by mouth 2 (two) times daily with a meal. Patient taking differently: Take 325 mg by mouth daily with breakfast. 06/30/19   Regalado, Belkys A, MD  glipiZIDE (GLUCOTROL XL) 2.5 MG 24 hr tablet Take 2.5 mg by mouth daily with breakfast.  06/09/20 06/09/21  [provider]  hydrALAZINE (APRESOLINE) 50 MG tablet Take 1 tablet (50 mg total) by mouth 3 (three) times daily. 07/21/20   Minna Merritts, MD  isosorbide mononitrate (IMDUR) 30 MG 24 hr tablet Take 1 tablet (30 mg total) by mouth 2 (two) times daily. 07/21/20   Minna Merritts, MD  lidocaine (LIDODERM) 5 % Place 1 patch onto the skin every 12 (twelve) hours. Remove & Discard patch within 12 hours or as directed by MD 08/25/20 08/25/21  Laban Emperor, PA-C  Multiple Vitamin (MULTIVITAMIN) tablet Take 1 tablet by mouth daily.    [provider]  Potassium Chloride ER 20 MEQ TBCR Take 20 mEq by mouth every Monday, Wednesday, and Friday. 01/25/20 07/21/20  Minna Merritts, MD  torsemide (DEMADEX) 20 MG tablet Take 2 tablets (40 mg total) by mouth daily. 07/21/20   Minna Merritts, MD  traMADol (ULTRAM) 50 MG tablet Take 1 tablet (50 mg total) by mouth every 6 (six) hours as needed. 08/25/20 08/25/21  Laban Emperor, PA-C    Physical Exam: Vitals:   12/03/20 1329 12/03/20 1332 12/03/20 1526  BP: (!) 145/76  (!) 168/86  Pulse: 68  65  Resp: 18  19  Temp: 97.9 F (36.6 C)    TempSrc: Oral    SpO2: 97%  98%  Weight:  78.9 kg   Height:  '5\' 9"'$  (1.753 m)      Vitals:   12/03/20 1329 12/03/20 1332 12/03/20 1526  BP: (!) 145/76  (!) 168/86  Pulse: 68  65  Resp: 18  19  Temp: 97.9 F (36.6 C)    TempSrc: Oral    SpO2: 97%  98%  Weight:  78.9 kg   Height:  '5\' 9"'$  (1.753 m)       Constitutional: Alert and oriented x 3 . Not in any apparent distress.  Chronically ill-appearing HEENT:      Head: Normocephalic and atraumatic.         Eyes: PERLA, EOMI,  Conjunctivae pallor. Sclera is non-icteric.       Mouth/Throat: Mucous membranes are moist.       Neck: Supple with no signs of meningismus. Cardiovascular: Regular rate and rhythm. No murmurs, gallops, or rubs. 2+ symmetrical distal pulses are present . No JVD. 3+ LE edema Respiratory: Respiratory effort normal .Lungs sounds clear bilaterally. No wheezes, crackles, or rhonchi.  Gastrointestinal: Soft, non tender, and non distended with positive bowel sounds.  Genitourinary: No CVA tenderness. Musculoskeletal: Nontender with normal range of motion in all extremities. No cyanosis, or erythema of extremities. Neurologic:  Face is symmetric. Moving all extremities. No gross focal neurologic deficits  Skin: Skin is warm, dry.  No rash or ulcers Psychiatric: Mood and affect are normal   Labs on Admission: I have personally reviewed following labs and imaging studies  CBC: Recent Labs  Lab 12/03/20 1346  WBC 7.5  HGB 7.5*  HCT 23.2*  MCV 96.7  PLT [id number]   Basic Metabolic Panel: Recent Labs  Lab 12/03/20 1346  NA 142  K 3.6  CL 111  CO2 24  GLUCOSE 126*  BUN 93*  CREATININE 3.20*  CALCIUM 7.9*    GFR: Estimated Creatinine Clearance: 25.5 mL/min (A) (by C-G formula based on SCr of 3.2 mg/dL (H)). Liver Function Tests: No results for input(s): AST, ALT, ALKPHOS, BILITOT, PROT, ALBUMIN in the last 168 hours. No results for input(s): LIPASE, AMYLASE in the last 168 hours. No results for input(s): AMMONIA in the last 168 hours. Coagulation Profile: Recent Labs  Lab 12/03/20 1346  INR 1.2   Cardiac Enzymes: No results for input(s): CKTOTAL, CKMB, CKMBINDEX, TROPONINI in the last 168 hours. BNP (last 3 results) No results for input(s): PROBNP in the last 8760 hours. HbA1C: No results for input(s): HGBA1C in the last 72 hours. CBG: No results for input(s): GLUCAP in the last 168 hours. Lipid Profile: No results for input(s): CHOL, HDL, LDLCALC, TRIG, CHOLHDL, LDLDIRECT in the last 72 hours. Thyroid Function Tests: No results for input(s): TSH, T4TOTAL, FREET4, T3FREE, THYROIDAB in the last 72 hours. Anemia Panel: No results for input(s): VITAMINB12, FOLATE, FERRITIN, TIBC, IRON, RETICCTPCT in the last 72 hours. Urine analysis:    Component Value Date/Time   COLORURINE STRAW (A) 06/28/2019 0945   APPEARANCEUR CLEAR 06/28/2019 0945   LABSPEC 1.009 06/28/2019 0945   PHURINE 5.0 06/28/2019 0945   GLUCOSEU NEGATIVE 06/28/2019 0945   HGBUR SMALL (A) 06/28/2019 0945   BILIRUBINUR NEGATIVE 06/28/2019 0945   KETONESUR NEGATIVE 06/28/2019 0945   PROTEINUR 30 (A) 06/28/2019 0945   NITRITE NEGATIVE 06/28/2019 0945   LEUKOCYTESUR NEGATIVE 06/28/2019 0945    Radiological Exams on Admission: DG Chest 2 View  Result Date: 12/03/2020 CLINICAL DATA:  60 year old male with history of shortness of breath. EXAM: CHEST - 2 VIEW COMPARISON:  Chest x-ray 06/30/2019. FINDINGS: Chronic left-sided pleural effusion, decreased compared to the prior study, with persistent loculated component in the inferior aspect of the left major fissure (similar to prior examinations). No right pleural  effusion. No definite consolidative airspace disease. No pneumothorax. No evidence of pulmonary edema. Heart size is normal. Upper mediastinal contours are within normal limits. Atherosclerotic calcifications in the thoracic aorta. IMPRESSION: 1. Small chronic left pleural effusion partially loculated in the left major fissure, decreased in size compared to prior examinations. 2. Aortic atherosclerosis. Electronically Signed   By: Vinnie Langton M.D.   On: 12/03/2020 14:13     Assessment/Plan Principal Problem:   AKI (acute kidney injury) ([place])  Active Problems:   Hypertension   Anemia of chronic disease   Chronic combined systolic (congestive) and diastolic (congestive) heart failure (HCC)   PAF (paroxysmal atrial fibrillation) (H[facility])   Type 2 diabetes mellitus with stage 3 chronic kidney disease ([facility])      Acute worsening of stage III chronic kidney disease Patient noted to have a bump in his serum creatinine from a baseline of 2.65 to 3.20 on admission BUN is elevated as well Will obtain urinalysis since patient has refractory lower extremity swelling to rule out proteinuria We will request nephrology consult for further evaluation and treatment We will obtain renal ultrasound    Anemia of chronic kidney disease Patient noted to have a hemoglobin of 7.5g/dl most likely related to his chronic kidney disease Obtain iron panel Patient may benefit from erythropoietin stimulating agents Will defer initiation to nephrology    Paroxysmal atrial fibrillation Patient is rate controlled Continue carvedilol Apixaban is on hold for possible renal biopsy    Diabetes mellitus with complications of stage III chronic kidney disease Hold oral hypoglycemic agents Place patient on consistent carbohydrate diet Glycemic control with sliding scale insulin   Chronic combined systolic and diastolic dysfunction CHF Stable and not acutely exacerbated Last known LVEF 40 - 45% Continue  carvedilol, hydralazine and nitrates Patient is currently on a Lasix drip per nephrology recommendation    DVT prophylaxis: SCD Code Status: full code Family Communication: Greater than 50% of time was spent discussing patient's condition and plan of care with him and his wife at the bedside.  All questions and concerns have been addressed.  They verbalized understanding and agreed with the plan. Disposition Plan: Back to previous home environment Consults called: Nephrology Status: At the time of admission, it appears that the appropriate admission status for this patient is inpatient.  This is judged to be reasonable and necessary in order to provide the required intensity of service to ensure the patient's safety given the presenting symptoms, physical exam findings, and initial radiographic and laboratory data in the context of their comorbid conditions. Patient requires inpatient status due to high intensity of service, high risk for further deterioration and high frequency of surveillance required.    Collier Bullock MD [facility]     12/03/2020, 5:47 PM

## 2020-12-04 DIAGNOSIS — I5022 Chronic systolic (congestive) heart failure: Secondary | ICD-10-CM

## 2020-12-04 DIAGNOSIS — N189 Chronic kidney disease, unspecified: Secondary | ICD-10-CM

## 2020-12-04 DIAGNOSIS — E1122 Type 2 diabetes mellitus with diabetic chronic kidney disease: Secondary | ICD-10-CM

## 2020-12-04 DIAGNOSIS — I480 Paroxysmal atrial fibrillation: Secondary | ICD-10-CM

## 2020-12-04 DIAGNOSIS — N184 Chronic kidney disease, stage 4 (severe): Secondary | ICD-10-CM

## 2020-12-04 DIAGNOSIS — R809 Proteinuria, unspecified: Secondary | ICD-10-CM

## 2020-12-04 LAB — PROTIME-INR
INR: 1.2 (ref 0.8–1.2)
Prothrombin Time: 14.8 seconds (ref 11.4–15.2)

## 2020-12-04 LAB — CBG MONITORING, ED: Glucose-Capillary: 103 mg/dL — ABNORMAL HIGH (ref 70–99)

## 2020-12-04 LAB — APTT: aPTT: 30 seconds (ref 24–36)

## 2020-12-04 MED ORDER — FERROUS SULFATE 325 (65 FE) MG PO TABS: 325.0000 mg | ORAL_TABLET | Freq: Every day | ORAL | Status: AC

## 2020-12-04 MED ORDER — HYDRALAZINE HCL 50 MG PO TABS: 100.0000 mg | ORAL_TABLET | Freq: Three times a day (TID) | ORAL | 3 refills | Status: DC

## 2020-12-04 MED ORDER — CLONIDINE HCL 0.1 MG PO TABS
0.1000 mg | ORAL_TABLET | ORAL | Status: AC
  Administered 2020-12-04: 0.1 mg via ORAL
  Filled 2020-12-04: qty 1

## 2020-12-04 NOTE — ED Notes (Signed)
Family member in room given recliner, pillow, and blankets to spend the night with pt

## 2020-12-04 NOTE — Progress Notes (Signed)
Nathan [name], Nathan [name] 12/04/20  Subjective:   Hospital day # 1 Patient known to our practice from outpatient follow-up He is being evaluated for nephrotic proteinuria He has hypertension, congestive heart failure, diabetes, peripheral vascular disease and coronary disease. He was referred to the hospitalist for kidney biopsy.  However, his blood pressure is severely elevated therefore biopsy has been delayed. In addition, creatinine trend is noted to be worsening    Objective:  Vital signs in last 24 hours:  Temp:  [97.9 F (36.6 C)] 97.9 F (36.6 C) (03/16 1329) Pulse Rate:  [63-69] 66 (03/17 0915) Resp:  [10-19] 14 (03/17 0730) BP: (119-183)/(57-115) 151/115 (03/17 0915) SpO2:  [93 %-98 %] 96 % (03/17 0915) Weight:  [78.9 kg] 78.9 kg (03/16 1332)  Weight change:  Filed Weights   12/03/20 1332  Weight: 78.9 kg    Intake/Output:    Intake/Output Summary (Last 24 hours) at 12/04/2020 1001 Last data filed at 12/04/2020 [id number] Gross per 24 hour  Intake --  Output 1500 ml  Net -1500 ml    Physical Exam: General:  No acute distress, laying in the bed  HEENT  anicteric, moist oral mucous membrane  Pulm/lungs  normal breathing effort, lungs are clear to auscultation  CVS/Heart  regular rhythm, no rub or gallop  Abdomen:   Soft, nontender  Extremities:  ++ peripheral edema  Neurologic:  Alert, oriented, able to follow commands  Skin:  No acute rashes      Basic Metabolic Panel:  Recent Labs  Lab 12/03/20 1346  NA 142  K 3.6  CL 111  CO2 24  GLUCOSE 126*  BUN 93*  CREATININE 3.20*  CALCIUM 7.9*     CBC: Recent Labs  Lab 12/03/20 1346  WBC 7.5  HGB 7.5*  HCT 23.2*  MCV 96.7  PLT 302     No results found for: HEPBSAG, HEPBSAB, HEPBIGM    Microbiology:  Recent Results (from the past 240 hour(s))  Resp Panel by RT-PCR (Flu A&B, Covid) Nasopharyngeal Swab     Status: None   Collection Time: 12/03/20  3:28 PM    Specimen: Nasopharyngeal Swab; Nasopharyngeal(NP) swabs in vial transport medium  Result Value Ref Range Status   SARS Coronavirus 2 by RT PCR NEGATIVE NEGATIVE Final    Comment: (NOTE) SARS-CoV-2 target nucleic acids are NOT DETECTED.  The SARS-CoV-2 RNA is generally detectable in upper respiratory specimens during the acute phase of infection. The lowest concentration of SARS-CoV-2 viral copies this assay can detect is 138 copies/mL. A negative result does not preclude SARS-Cov-2 infection and should not be used as the sole basis for treatment or other patient management decisions. A negative result may occur with  improper specimen collection/handling, submission of specimen other than nasopharyngeal swab, presence of viral mutation(s) within the areas targeted by this assay, and inadequate number of viral copies(<138 copies/mL). A negative result must be combined with clinical observations, patient history, and epidemiological information. The expected result is Negative.  Fact Sheet for Patients:  EntrepreneurPulse.com.au  Fact Sheet for Healthcare Providers:  IncredibleEmployment.be  This test is no t yet approved or cleared by the [place] FDA and  has been authorized for detection and/or diagnosis of SARS-CoV-2 by FDA under an Emergency Use Authorization (EUA). This EUA will remain  in effect (meaning this test can be used) for the duration of the COVID-19 declaration under Section 564(b)(1) of the Act, 21 U.S.C.section 360bbb-3(b)(1), unless the authorization is terminated  or revoked  sooner.       Influenza A by PCR NEGATIVE NEGATIVE Final   Influenza B by PCR NEGATIVE NEGATIVE Final    Comment: (NOTE) The Xpert Xpress SARS-CoV-2/FLU/RSV plus assay is intended as an aid in the diagnosis of influenza from Nasopharyngeal swab specimens and should not be used as a sole basis for treatment. Nasal washings and aspirates are  unacceptable for Xpert Xpress SARS-CoV-2/FLU/RSV testing.  Fact Sheet for Patients: EntrepreneurPulse.com.au  Fact Sheet for Healthcare Providers: IncredibleEmployment.be  This test is not yet approved or cleared by the [place] FDA and has been authorized for detection and/or diagnosis of SARS-CoV-2 by FDA under an Emergency Use Authorization (EUA). This EUA will remain in effect (meaning this test can be used) for the duration of the COVID-19 declaration under Section 564(b)(1) of the Act, 21 U.S.C. section 360bbb-3(b)(1), unless the authorization is terminated or revoked.  Performed at [facility], [address] [place], [place] [address]     Coagulation Studies: Recent Labs    12/03/20 1346 12/04/20 0441  LABPROT 14.4 14.8  INR 1.2 1.2    Urinalysis: Recent Labs    12/03/20 1950  COLORURINE AMBER*  LABSPEC 1.013  PHURINE 5.0  GLUCOSEU 150*  HGBUR NEGATIVE  BILIRUBINUR NEGATIVE  KETONESUR NEGATIVE  PROTEINUR >=300*  NITRITE NEGATIVE  LEUKOCYTESUR NEGATIVE      Imaging: DG Chest 2 View  Result Date: 12/03/2020 CLINICAL DATA:  60 year old male with history of shortness of breath. EXAM: CHEST - 2 VIEW COMPARISON:  Chest x-ray 06/30/2019. FINDINGS: Chronic left-sided pleural effusion, decreased compared to the prior study, with persistent loculated component in the inferior aspect of the left major fissure (similar to prior examinations). No right pleural effusion. No definite consolidative airspace disease. No pneumothorax. No evidence of pulmonary edema. Heart size is normal. Upper mediastinal contours are within normal limits. Atherosclerotic calcifications in the thoracic aorta. IMPRESSION: 1. Small chronic left pleural effusion partially loculated in the left major fissure, decreased in size compared to prior examinations. 2. Aortic atherosclerosis. Electronically Signed   By: Vinnie Langton M.D.   On:  12/03/2020 14:13   US RENAL  Result Date: 12/03/2020 CLINICAL DATA:  Nephrotic syndrome, concern for renal vein thrombosis. EXAM: RENAL / URINARY TRACT ULTRASOUND COMPLETE COMPARISON:  Abdominal sonogram of October 29, 2019. FINDINGS: Right Kidney: Renal measurements: 11.8 x 6.7 x 6.6 cm = volume: 272 mL. Increased echogenicity of renal parenchyma. Poor corticomedullary differentiation. Small amount of perinephric fluid and presence of visible ascites in the abdomen as well. No hydronephrosis. Left Kidney: Renal measurements: 11.8 x 7.1 x 5.3 cm = volume: 233 mL. Increased echogenicity of renal parenchyma also with small amount of perinephric fluid. No hydronephrosis. Bladder: Urinary bladder with smooth contours. Other: Ascites, small volume in the abdomen and in the pelvis. There is gross evidence of color Doppler flow about the LEFT and RIGHT renal hilum. Pattern suggest patent vessels at the hilum though dedicated renal venous assessment was not performed as part of a standard renal sonogram. RIGHT pleural effusion partially imaged. IMPRESSION: Signs of anasarca in the setting of renal dysfunction. Renal size grossly similar to prior imaging and with color Doppler flow at the LEFT and RIGHT renal hilum without gross abnormality, this would argue against renal vein thrombosis; however, study not performed as renal Doppler evaluation and does not fully evaluate the renal veins. If there is high clinical suspicion for renal vein thrombosis renal Doppler evaluation would be the preferred test to exclude this  possibility. Electronically Signed   By: Zetta Bills M.D.   On: 12/03/2020 18:38     Medications:   . sodium chloride    . furosemide (LASIX) 200 mg in dextrose 5% 100 mL ('2mg'$ /mL) infusion 4 mg/hr (12/04/20 0245)   . atorvastatin  40 mg Oral Daily  . carvedilol  25 mg Oral BID WC  . ferrous sulfate  325 mg Oral Q breakfast  . hydrALAZINE  50 mg Oral TID  . insulin aspart  0-9 Units  Subcutaneous TID WC  . isosorbide mononitrate  30 mg Oral BID  . multivitamin with minerals  1 tablet Oral Daily  . [START ON 12/05/2020] potassium chloride  20 mEq Oral Once per day on Mon Wed Fri  . sodium chloride flush  3 mL Intravenous Q12H   sodium chloride, ondansetron **OR** ondansetron (ZOFRAN) IV, sodium chloride flush, traMADol  Assessment/ Plan:  60 y.o. male with a morning diabetes, atrial fibrillation, hypertension, chronic systolic and diastolic CHF admitted on 0000000 for AKI (acute kidney injury) ([place]) [N17.9]  #Acute kidney injury, #Nephrotic proteinuria #Chronic kidney disease stage IV.  Creatinine 2.8, GFR 23 on October 30, 2020 November 05, 2020: ANA negative, SPEP negative for M spike, negative hepatitis B surface antigen, negative hepatitis C antibody, TSH elevated at 9.058 -Urine protein to creatinine ratio 21 g - PLA 2R antibody, complements as as outpatient -Renal ultrasound negative for renal vein thrombosis  Unfortunately, patient's blood pressure was suboptimally controlled for risk of kidney biopsy.  Patient did not want to stay any longer for continued blood pressure control and possible biopsy tomorrow He would like to get discharged and return in 1 to 2 weeks as outpatient for kidney biopsy once his blood pressure is better controlled.  #Secondary hyperparathyroidism PTH 124 on February 16  #Diabetes type 2 with CKD LDL 92, triglycerides 65, in October 30, 2020 Lab Results  Component Value Date   HGBA1C 6.0 (H) 12/03/2020  -Not on ACE inhibitor or ARB at present  #Severe hypertension Increase hydralazine to 100 mg 3 times daily  #Anemia of chronic kidney disease Lab Results  Component Value Date   HGB 7.5 (L) 12/03/2020         LOS: 1 Judyann Casasola 3/17/202210:01 AM  [place], [place]  Note: This note was prepared with Dragon dictation. Any transcription errors are unintentional

## 2020-12-04 NOTE — Discharge Instructions (Signed)
Acute Kidney Injury, Adult  Acute kidney injury is a sudden worsening of kidney function. The kidneys are organs that have several jobs. They filter the blood to remove waste products and extra fluid. They also maintain a healthy balance of minerals and hormones in the body, which helps control blood pressure and keep bones strong. With this condition, your kidneys do not do their jobs as well as they should. This condition ranges from mild to severe. Over time, it may develop into long-lasting (chronic) kidney disease. Early detection and treatment may prevent acute kidney injury from developing into a chronic condition. What are the causes? Common causes of this condition include:  A problem with blood flow to the kidneys. This may be caused by: ? Low blood pressure (hypotension) or shock. ? Blood loss. ? Heart and blood vessel (cardiovascular) disease. ? Severe burns. ? Liver disease.  Direct damage to the kidneys. This may be caused by: ? Certain medicines. ? A kidney infection. ? Poisoning. ? Being around or in contact with toxic substances. ? A surgical wound. ? A hard, direct hit to the kidney area.  A sudden blockage of urine flow. This may be caused by: ? Cancer. ? Kidney stones. ? An enlarged prostate in males. What increases the risk? You are more likely to develop this condition if you:  Are older than age 65.  Are male.  Are hospitalized, especially if you are in critical condition.  Have certain conditions, such as: ? Chronic kidney disease. ? Diabetes. ? Coronary artery disease and heart failure. ? Pulmonary disease. ? Chronic liver disease. What are the signs or symptoms? Symptoms of this condition may not be obvious until the condition becomes severe. Symptoms of this condition can include:  Tiredness (lethargy) or difficulty staying awake.  Nausea or vomiting.  Swelling (edema) of the face, legs, ankles, or feet.  Problems with urination, such  as: ? Pain in the abdomen, or pain along the side of your stomach (flank). ? Producing little or no urine. ? Passing urine with a weak flow.  Muscle twitches and cramps, especially in the legs.  Confusion or trouble concentrating.  Loss of appetite.  Fever. How is this diagnosed? Your health care provider can diagnose this condition based on your symptoms, medical history, and a physical exam.  You may also have other tests, such as:  Blood tests.  Urine tests.  Imaging tests.  A test in which a sample of tissue is removed from the kidneys to be examined under a microscope (kidney biopsy). How is this treated? Treatment for this condition depends on the cause and how severe the condition is. In mild cases, treatment may not be needed. The kidneys may heal on their own. In more severe cases, treatment will involve:  Treating the cause of the kidney injury. This may involve changing any medicines you are taking or adjusting your dosage.  Fluids. You may need specialized IV fluids to balance your body's needs.  Having a catheter placed to drain urine and prevent blockages.  Preventing problems from occurring. This may mean avoiding certain medicines or procedures that can cause further injury to the kidneys. In some cases, treatment may also require:  A procedure to remove toxic wastes from the body (dialysis or continuous renal replacement therapy, CRRT).  Surgery. This may be done to repair a torn kidney or to remove the blockage from the urinary system. Follow these instructions at home: Medicines  Take over-the-counter and prescription medicines only as   told by your health care provider.  Do not take any new medicines without your health care provider's approval. Many medicines can worsen your kidney damage.  Do not take any vitamin and mineral supplements without your health care provider's approval. Many nutritional supplements can worsen your kidney  damage. Lifestyle  If your health care provider prescribed changes to your diet, follow them. You may need to decrease the amount of protein you eat.  Achieve and maintain a healthy weight. If you need help with this, ask your health care provider.  Start or continue an exercise plan. Try to exercise at least 30 minutes a day, 5 days a week.  Do not use any products that contain nicotine or tobacco, such as cigarettes, e-cigarettes, and chewing tobacco. If you need help quitting, ask your health care provider.   General instructions  Keep track of your blood pressure. Report changes in your blood pressure as told by your health care provider.  Stay up to date with your vaccines. Ask your health care provider which vaccines you need.  Keep all follow-up visits as told by your health care provider. This is important.   Where to find more information  American Association of Kidney Patients: www.aakp.org  National Kidney Foundation: www.kidney.org  American Kidney Fund: www.akfinc.org  Life Options Rehabilitation Program: ? www.lifeoptions.org ? www.kidneyschool.org Contact a health care provider if:  Your symptoms get worse.  You develop new symptoms. Get help right away if:  You develop symptoms of worsening kidney disease, which include: ? Headaches. ? Abnormally dark or light skin. ? Easy bruising. ? Frequent hiccups. ? Chest pain. ? Shortness of breath. ? End of menstruation in women. ? Seizures. ? Confusion or altered mental status. ? Abdominal or back pain. ? Itchiness.  You have a fever.  Your body is producing less urine.  You have pain or bleeding when you urinate. Summary  Acute kidney injury is a sudden worsening of kidney function.  Acute kidney injury can be caused by problems with blood flow to the kidneys, direct damage to the kidneys, and sudden blockage of urine flow.  Symptoms of this condition may not be obvious until it becomes severe.  Symptoms may include edema, lethargy, confusion, nausea or vomiting, and problems passing urine.  This condition can be diagnosed with blood tests, urine tests, and imaging tests. Sometimes a kidney biopsy is done to diagnose this condition.  Treatment for this condition often involves treating the underlying cause. It is treated with fluids, medicines, diet changes, dialysis, or surgery. This information is not intended to replace advice given to you by your health care provider. Make sure you discuss any questions you have with your health care provider. Document Revised: 07/17/2019 Document Reviewed: 07/17/2019 Elsevier Patient Education  2021 Elsevier Inc.  

## 2020-12-04 NOTE — Discharge Summary (Signed)
[facility] NAME: Nathan [name]    MR#:  [id number]  DATE OF BIRTH:  16-Sep-1961  DATE OF ADMISSION:  12/03/2020 ADMITTING PHYSICIAN: Collier Bullock, MD  DATE OF DISCHARGE: 12/04/2020 12:53 PM  PRIMARY CARE PHYSICIAN: Derinda Late, MD    ADMISSION DIAGNOSIS:  AKI (acute kidney injury) ([place]) [N17.9]  DISCHARGE DIAGNOSIS:  Principal Problem:   AKI (acute kidney injury) ([place]) Active Problems:   Hypertension   Anemia of chronic disease   Chronic combined systolic (congestive) and diastolic (congestive) heart failure (HCC)   PAF (paroxysmal atrial fibrillation) (HCC)   Type 2 diabetes mellitus with stage 3 chronic kidney disease ([place])   SECONDARY DIAGNOSIS:   Past Medical History:  Diagnosis Date  . Anemia of chronic disease   . Chronic combined systolic (congestive) and diastolic (congestive) heart failure ([place])    a. 05/2019 Echo: EF 45-50%, nl LA/RA size. Mild to mod TR.  . CKD (chronic kidney disease), stage III ([place])   . Diabetes mellitus without complication ([place])   . Hypertension   . Left Hydropneumothorax/Fibrinopurulent empyema    a. 06/2019 s/p VATS.  Marland Kitchen PAF (paroxysmal atrial fibrillation) ([place])    a. 05/2019 in setting of sepsis-->s/p TEE/DCCV; b. CHA2DS2VASc = 2-->Eliquis.  . Sepsis ([place])    a. 05/2019 MRSA in setting of infected R thumb.    HOSPITAL COURSE:   1.  Acute kidney injury on chronic kidney disease stage IV.  Patient's creatinine was 3.2 and baseline creatinine about 2.69.  Patient has lower extremity edema concerning for nephrotic syndrome.  The patient was initially placed on Lasix drip.  Nephrology ordered for a kidney biopsy.  Interventional radiology did not want to do it today because of his blood pressure being high.  The patient did not want to stay in the hospital if he is not getting the kidney biopsy.  Case discussed with nephrology and they will set up kidney biopsy as outpatient.   Nephrology was okay with discharge. 2.  Essential hypertension.  Blood pressure 163/75.  We increased hydralazine to 100 mg 3 times daily.  Patient on Coreg and torsemide already. 3.  Paroxysmal atrial fibrillation.  Can go back on Eliquis for right now until renal biopsy schedule begun.  Coreg for rate control. 4.  Type 2 diabetes mellitus with chronic kidney disease stage IV.  Discontinue glipizide not a good medication with chronic kidney disease.  Also hemoglobin A1c 6.0.  Hold off on diabetic meds. 5.  Anemia of chronic kidney disease.  Hemoglobin 7.5.  Continue to follow-up as outpatient. 6.  Chronic systolic congestive heart failure.  Continue torsemide and Coreg.  No ACE or ARB secondary to worsening kidney function.  No spironolactone secondary to worsening kidney function.  DISCHARGE CONDITIONS:   [place]  CONSULTS OBTAINED:  Nephrology  DRUG ALLERGIES:  No Known Allergies  DISCHARGE MEDICATIONS:   Allergies as of 12/04/2020   No Known Allergies     Medication List    STOP taking these medications   glipiZIDE 2.5 MG 24 hr tablet Commonly known as: GLUCOTROL XL     TAKE these medications   apixaban 5 MG Tabs tablet Commonly known as: Eliquis Take 1 tablet (5 mg total) by mouth 2 (two) times daily.   atorvastatin 40 MG tablet Commonly known as: LIPITOR Take 40 mg by mouth daily.   carvedilol 25 MG tablet Commonly known as: COREG Take 1 tablet (25 mg total) by mouth 2 (two)  times daily.   ferrous sulfate 325 (65 FE) MG tablet Take 1 tablet (325 mg total) by mouth daily with breakfast.   hydrALAZINE 50 MG tablet Commonly known as: APRESOLINE Take 2 tablets (100 mg total) by mouth 3 (three) times daily. What changed: how much to take   isosorbide mononitrate 30 MG 24 hr tablet Commonly known as: IMDUR Take 1 tablet (30 mg total) by mouth 2 (two) times daily.   multivitamin tablet Take 1 tablet by mouth daily.   Potassium Chloride ER 20 MEQ Tbcr Take 20 mEq  by mouth every Monday, Wednesday, and Friday.   torsemide 20 MG tablet Commonly known as: DEMADEX Take 2 tablets (40 mg total) by mouth daily.        DISCHARGE INSTRUCTIONS:   Follow-up nephrology 1 week Follow-up PMD 5 days  If you experience worsening of your admission symptoms, develop shortness of breath, life threatening emergency, suicidal or homicidal thoughts you must seek medical attention immediately by calling 911 or calling your MD immediately  if symptoms less severe.  You Must read complete instructions/literature along with all the possible adverse reactions/side effects for all the Medicines you take and that have been prescribed to you. Take any new Medicines after you have completely understood and accept all the possible adverse reactions/side effects.   Please note  You were cared for by a hospitalist during your hospital stay. If you have any questions about your discharge medications or the care you received while you were in the hospital after you are discharged, you can call the unit and asked to speak with the hospitalist on call if the hospitalist that took care of you is not available. Once you are discharged, your primary care physician will handle any further medical issues. Please note that NO REFILLS for any discharge medications will be authorized once you are discharged, as it is imperative that you return to your primary care physician (or establish a relationship with a primary care physician if you do not have one) for your aftercare needs so that they can reassess your need for medications and monitor your lab values.    Today   CHIEF COMPLAINT:   Chief Complaint  Patient presents with  . Leg Swelling  . abd distention    HISTORY OF PRESENT ILLNESS:  Nathan [name]  is a 60 y.o. male came in with leg swelling abdominal distention.   VITAL SIGNS:  Blood pressure (!) 163/75, pulse 65, temperature 97.9 F (36.6 C), temperature source Oral,  resp. rate 13, height '5\' 9"'$  (1.753 m), weight 78.9 kg, SpO2 95 %.  I/O:    Intake/Output Summary (Last 24 hours) at 12/04/2020 1643 Last data filed at 12/04/2020 0927 Gross per 24 hour  Intake --  Output 1500 ml  Net -1500 ml    PHYSICAL EXAMINATION:  GENERAL:  60 y.o.-year-old patient lying in the bed with no acute distress.  EYES: Pupils equal, round, reactive to light and accommodation. No scleral icterus. Extraocular muscles intact.  HEENT: Head atraumatic, normocephalic. Oropharynx and nasopharynx clear.   LUNGS: Normal breath sounds bilaterally, no wheezing, rales,rhonchi or crepitation. No use of accessory muscles of respiration.  CARDIOVASCULAR: S1, S2 normal. No murmurs, rubs, or gallops.  ABDOMEN: Soft, non-tender, distended. Bowel sounds present. No organomegaly or mass.  EXTREMITIES: 3+ pedal edema.  NEUROLOGIC: Cranial nerves II through XII are intact. Muscle strength 5/5 in all extremities. Sensation intact. Gait not checked.  PSYCHIATRIC: The patient is alert and oriented x 3.  SKIN: No obvious rash, lesion, or ulcer.   DATA REVIEW:   CBC Recent Labs  Lab 12/03/20 1346  WBC 7.5  HGB 7.5*  HCT 23.2*  PLT 302    Chemistries  Recent Labs  Lab 12/03/20 1346  NA 142  K 3.6  CL 111  CO2 24  GLUCOSE 126*  BUN 93*  CREATININE 3.20*  CALCIUM 7.9*     Microbiology Results  Results for orders placed or performed during the hospital encounter of 12/03/20  Resp Panel by RT-PCR (Flu A&B, Covid) Nasopharyngeal Swab     Status: None   Collection Time: 12/03/20  3:28 PM   Specimen: Nasopharyngeal Swab; Nasopharyngeal(NP) swabs in vial transport medium  Result Value Ref Range Status   SARS Coronavirus 2 by RT PCR NEGATIVE NEGATIVE Final    Comment: (NOTE) SARS-CoV-2 target nucleic acids are NOT DETECTED.  The SARS-CoV-2 RNA is generally detectable in upper respiratory specimens during the acute phase of infection. The lowest concentration of SARS-CoV-2 viral  copies this assay can detect is 138 copies/mL. A negative result does not preclude SARS-Cov-2 infection and should not be used as the sole basis for treatment or other patient management decisions. A negative result may occur with  improper specimen collection/handling, submission of specimen other than nasopharyngeal swab, presence of viral mutation(s) within the areas targeted by this assay, and inadequate number of viral copies(<138 copies/mL). A negative result must be combined with clinical observations, patient history, and epidemiological information. The expected result is Negative.  Fact Sheet for Patients:  EntrepreneurPulse.com.au  Fact Sheet for Healthcare Providers:  IncredibleEmployment.be  This test is no t yet approved or cleared by the [place] FDA and  has been authorized for detection and/or diagnosis of SARS-CoV-2 by FDA under an Emergency Use Authorization (EUA). This EUA will remain  in effect (meaning this test can be used) for the duration of the COVID-19 declaration under Section 564(b)(1) of the Act, 21 U.S.C.section 360bbb-3(b)(1), unless the authorization is terminated  or revoked sooner.       Influenza A by PCR NEGATIVE NEGATIVE Final   Influenza B by PCR NEGATIVE NEGATIVE Final    Comment: (NOTE) The Xpert Xpress SARS-CoV-2/FLU/RSV plus assay is intended as an aid in the diagnosis of influenza from Nasopharyngeal swab specimens and should not be used as a sole basis for treatment. Nasal washings and aspirates are unacceptable for Xpert Xpress SARS-CoV-2/FLU/RSV testing.  Fact Sheet for Patients: EntrepreneurPulse.com.au  Fact Sheet for Healthcare Providers: IncredibleEmployment.be  This test is not yet approved or cleared by the [place] FDA and has been authorized for detection and/or diagnosis of SARS-CoV-2 by FDA under an Emergency Use Authorization (EUA). This  EUA will remain in effect (meaning this test can be used) for the duration of the COVID-19 declaration under Section 564(b)(1) of the Act, 21 U.S.C. section 360bbb-3(b)(1), unless the authorization is terminated or revoked.  Performed at [facility], [address] [place], [place] [address]     RADIOLOGY:  DG Chest 2 View  Result Date: 12/03/2020 CLINICAL DATA:  60 year old male with history of shortness of breath. EXAM: CHEST - 2 VIEW COMPARISON:  Chest x-ray 06/30/2019. FINDINGS: Chronic left-sided pleural effusion, decreased compared to the prior study, with persistent loculated component in the inferior aspect of the left major fissure (similar to prior examinations). No right pleural effusion. No definite consolidative airspace disease. No pneumothorax. No evidence of pulmonary edema. Heart size is normal. Upper mediastinal contours are within normal  limits. Atherosclerotic calcifications in the thoracic aorta. IMPRESSION: 1. Small chronic left pleural effusion partially loculated in the left major fissure, decreased in size compared to prior examinations. 2. Aortic atherosclerosis. Electronically Signed   By: Vinnie Langton M.D.   On: 12/03/2020 14:13   US RENAL  Result Date: 12/03/2020 CLINICAL DATA:  Nephrotic syndrome, concern for renal vein thrombosis. EXAM: RENAL / URINARY TRACT ULTRASOUND COMPLETE COMPARISON:  Abdominal sonogram of October 29, 2019. FINDINGS: Right Kidney: Renal measurements: 11.8 x 6.7 x 6.6 cm = volume: 272 mL. Increased echogenicity of renal parenchyma. Poor corticomedullary differentiation. Small amount of perinephric fluid and presence of visible ascites in the abdomen as well. No hydronephrosis. Left Kidney: Renal measurements: 11.8 x 7.1 x 5.3 cm = volume: 233 mL. Increased echogenicity of renal parenchyma also with small amount of perinephric fluid. No hydronephrosis. Bladder: Urinary bladder with smooth contours. Other: Ascites, small volume in  the abdomen and in the pelvis. There is gross evidence of color Doppler flow about the LEFT and RIGHT renal hilum. Pattern suggest patent vessels at the hilum though dedicated renal venous assessment was not performed as part of a standard renal sonogram. RIGHT pleural effusion partially imaged. IMPRESSION: Signs of anasarca in the setting of renal dysfunction. Renal size grossly similar to prior imaging and with color Doppler flow at the LEFT and RIGHT renal hilum without gross abnormality, this would argue against renal vein thrombosis; however, study not performed as renal Doppler evaluation and does not fully evaluate the renal veins. If there is high clinical suspicion for renal vein thrombosis renal Doppler evaluation would be the preferred test to exclude this possibility. Electronically Signed   By: Zetta Bills M.D.   On: 12/03/2020 18:38       Management plans discussed with the patient, family and they are in agreement.  CODE STATUS:     Code Status Orders  (From admission, onward)         Start     Ordered   12/03/20 1639  Full code  Continuous        12/03/20 1640        Code Status History    Date Active Date Inactive Code Status Order ID Comments User Context   06/24/2019 1737 06/30/2019 2056 Full Code [id number]  Mckinley Jewel, MD Inpatient   06/19/2019 2234 06/24/2019 1618 Full Code [id number]  Nicholes Mango, MD Inpatient   06/10/2019 1736 06/16/2019 1553 Full Code [id number]  Otila Back, MD ED   Advance Care Planning Activity      TOTAL TIME TAKING CARE OF THIS PATIENT: 35 minutes.    Loletha Grayer M.D on 12/04/2020 at 4:43 PM  Between 7am to 6pm - Pager - [phone]  After 6pm go to [email or web address] - password [email or web address]  [facility]  CC: Primary care physician; Derinda Late, MD

## 2020-12-04 NOTE — ED Notes (Signed)
Pt verbalized understanding of d/c instructions at this time. Pt denied further questions at this time. Pt assisted to ED entrance in wheelchair at this time, tolerated well. NAD noted, RR even and unlabored at this time.

## 2020-12-31 ENCOUNTER — Other Ambulatory Visit: Payer: Self-pay | Admitting: Family Medicine

## 2020-12-31 DIAGNOSIS — I5043 Acute on chronic combined systolic (congestive) and diastolic (congestive) heart failure: Secondary | ICD-10-CM

## 2021-02-07 IMAGING — CR DG HIP (WITH OR WITHOUT PELVIS) 2-3V*R*
1 series · 3 of 3 positions shown · non-contrast
Comparison: None.

CLINICAL DATA: Right hip pain after fall 2 weeks ago

EXAM:
DG HIP (WITH OR WITHOUT PELVIS) 2-3V RIGHT

[Series 1: dg hip unilat w or w/o pelvis 2-3 views  · non-contrast · 0.14mm/px · 3 of 3 slices shown]
[im 1/3]
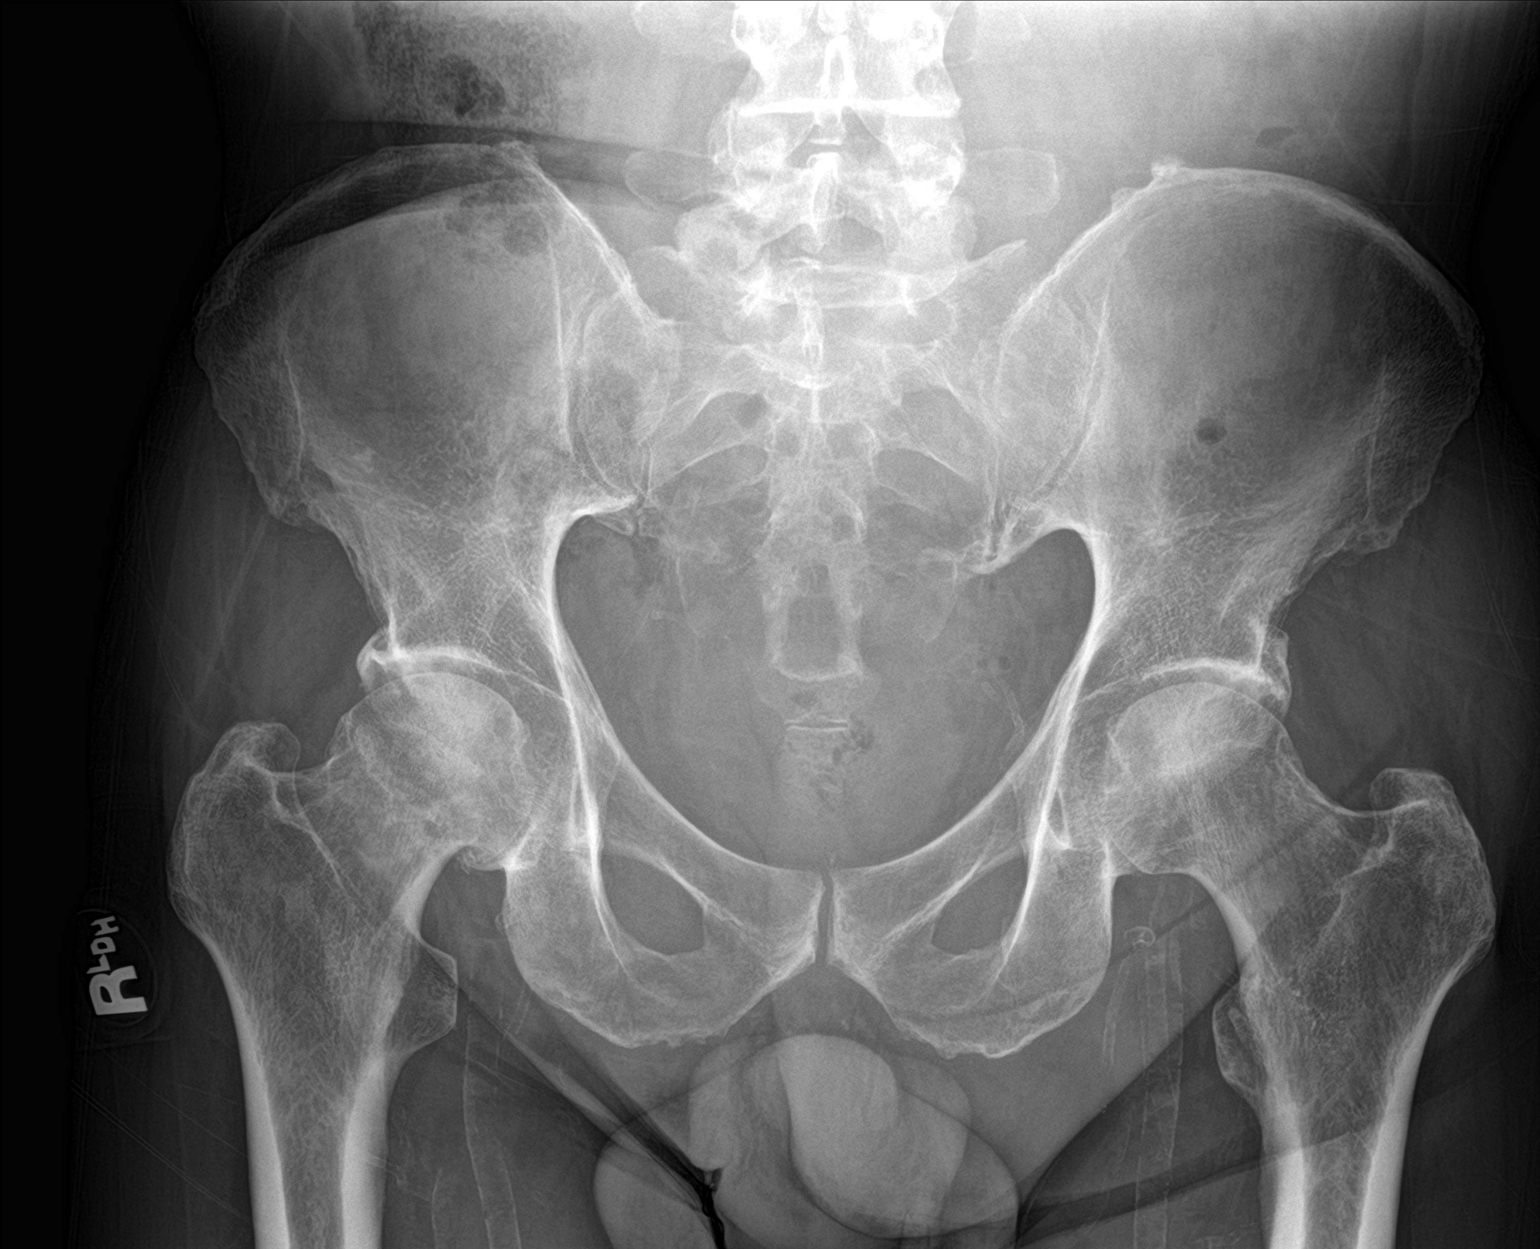
[im 2/3]
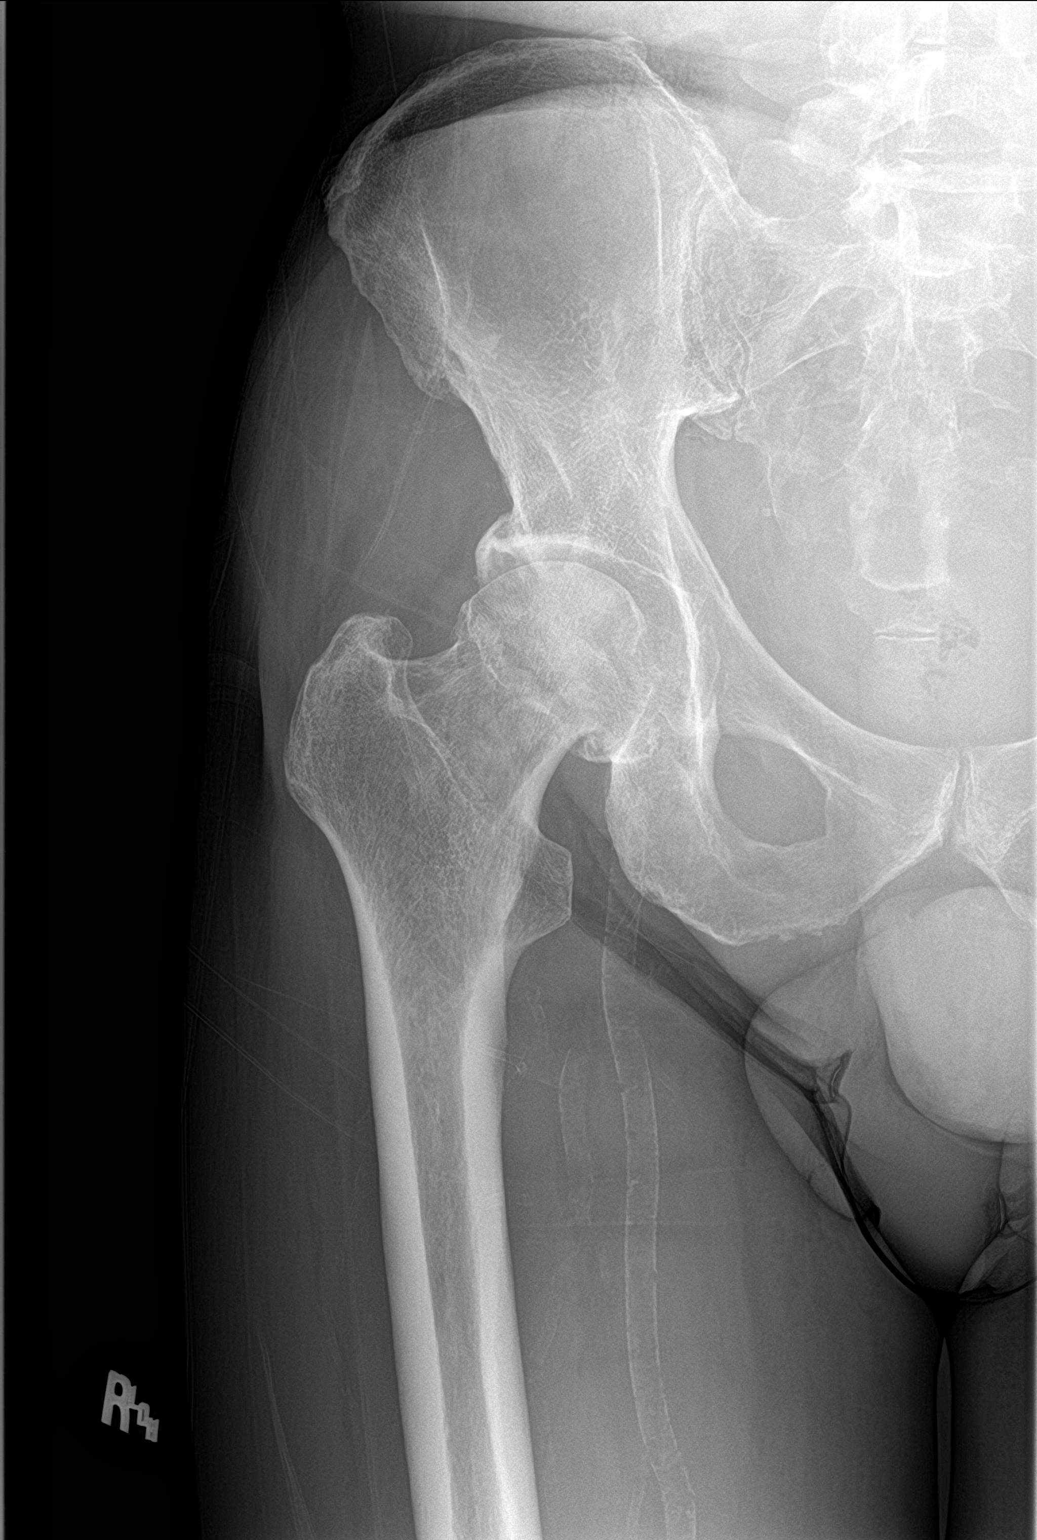
[im 3/3]
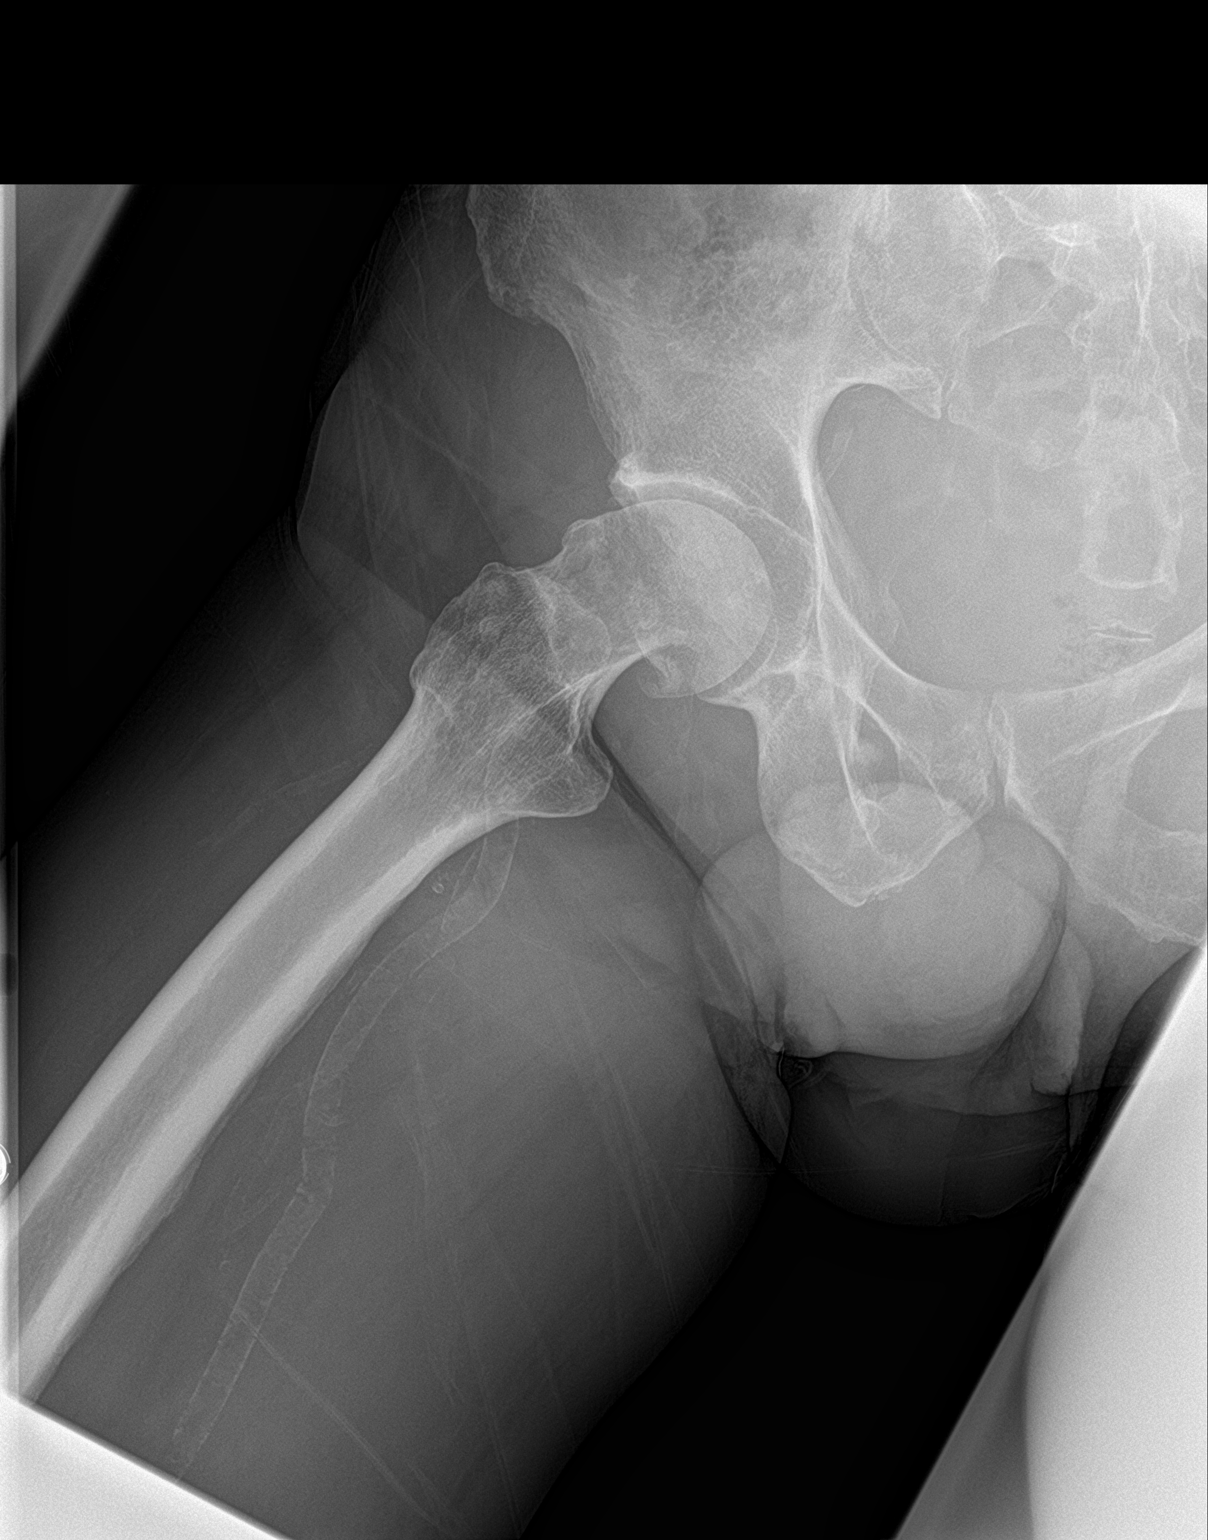

[3 of 3 positions shown; findings below may reference images not displayed]

FINDINGS: No acute fracture is seen. Hip osteoarthritis with marginal spurring
on the right more than left, with ring of osteophytes overlapping
the right femoral neck. Premature arterial calcification which is
confluent in the thighs.
IMPRESSION: 1. No acute finding.
2. Hip osteoarthritis with asymmetric right-sided spurring.

## 2021-03-12 ENCOUNTER — Emergency Department: Payer: Self-pay

## 2021-03-12 ENCOUNTER — Encounter: Payer: Self-pay | Admitting: Emergency Medicine

## 2021-03-12 ENCOUNTER — Inpatient Hospital Stay
Admission: EM | Admit: 2021-03-12 | Discharge: 2021-03-19 | DRG: 291 | Disposition: A | Discharge status: Home / Self Care | Payer: Self-pay | Attending: Internal Medicine | Admitting: Internal Medicine

## 2021-03-12 ENCOUNTER — Other Ambulatory Visit: Payer: Self-pay

## 2021-03-12 DIAGNOSIS — Z7901 Long term (current) use of anticoagulants: Secondary | ICD-10-CM

## 2021-03-12 DIAGNOSIS — R801 Persistent proteinuria, unspecified: Secondary | ICD-10-CM

## 2021-03-12 DIAGNOSIS — I10 Essential (primary) hypertension: Secondary | ICD-10-CM | POA: Diagnosis present

## 2021-03-12 DIAGNOSIS — D631 Anemia in chronic kidney disease: Secondary | ICD-10-CM | POA: Diagnosis present

## 2021-03-12 DIAGNOSIS — Z8249 Family history of ischemic heart disease and other diseases of the circulatory system: Secondary | ICD-10-CM

## 2021-03-12 DIAGNOSIS — N179 Acute kidney failure, unspecified: Secondary | ICD-10-CM | POA: Diagnosis present

## 2021-03-12 DIAGNOSIS — E1122 Type 2 diabetes mellitus with diabetic chronic kidney disease: Secondary | ICD-10-CM | POA: Diagnosis present

## 2021-03-12 DIAGNOSIS — Z79899 Other long term (current) drug therapy: Secondary | ICD-10-CM

## 2021-03-12 DIAGNOSIS — R601 Generalized edema: Secondary | ICD-10-CM

## 2021-03-12 DIAGNOSIS — E782 Mixed hyperlipidemia: Secondary | ICD-10-CM

## 2021-03-12 DIAGNOSIS — E213 Hyperparathyroidism, unspecified: Secondary | ICD-10-CM | POA: Diagnosis present

## 2021-03-12 DIAGNOSIS — I5043 Acute on chronic combined systolic (congestive) and diastolic (congestive) heart failure: Secondary | ICD-10-CM | POA: Diagnosis present

## 2021-03-12 DIAGNOSIS — Z28311 Partially vaccinated for covid-19: Secondary | ICD-10-CM

## 2021-03-12 DIAGNOSIS — E8809 Other disorders of plasma-protein metabolism, not elsewhere classified: Secondary | ICD-10-CM | POA: Diagnosis not present

## 2021-03-12 DIAGNOSIS — I429 Cardiomyopathy, unspecified: Secondary | ICD-10-CM | POA: Diagnosis present

## 2021-03-12 DIAGNOSIS — I2510 Atherosclerotic heart disease of native coronary artery without angina pectoris: Secondary | ICD-10-CM | POA: Diagnosis present

## 2021-03-12 DIAGNOSIS — Z833 Family history of diabetes mellitus: Secondary | ICD-10-CM

## 2021-03-12 DIAGNOSIS — E785 Hyperlipidemia, unspecified: Secondary | ICD-10-CM | POA: Diagnosis present

## 2021-03-12 DIAGNOSIS — R809 Proteinuria, unspecified: Secondary | ICD-10-CM | POA: Diagnosis present

## 2021-03-12 DIAGNOSIS — I509 Heart failure, unspecified: Secondary | ICD-10-CM

## 2021-03-12 DIAGNOSIS — Z9119 Patient's noncompliance with other medical treatment and regimen: Secondary | ICD-10-CM

## 2021-03-12 DIAGNOSIS — T501X6A Underdosing of loop [high-ceiling] diuretics, initial encounter: Secondary | ICD-10-CM | POA: Diagnosis present

## 2021-03-12 DIAGNOSIS — I472 Ventricular tachycardia: Secondary | ICD-10-CM | POA: Diagnosis not present

## 2021-03-12 DIAGNOSIS — N189 Chronic kidney disease, unspecified: Secondary | ICD-10-CM | POA: Diagnosis present

## 2021-03-12 DIAGNOSIS — K5289 Other specified noninfective gastroenteritis and colitis: Secondary | ICD-10-CM | POA: Diagnosis present

## 2021-03-12 DIAGNOSIS — I5023 Acute on chronic systolic (congestive) heart failure: Secondary | ICD-10-CM

## 2021-03-12 DIAGNOSIS — D638 Anemia in other chronic diseases classified elsewhere: Secondary | ICD-10-CM | POA: Diagnosis present

## 2021-03-12 DIAGNOSIS — I130 Hypertensive heart and chronic kidney disease with heart failure and stage 1 through stage 4 chronic kidney disease, or unspecified chronic kidney disease: Principal | ICD-10-CM | POA: Diagnosis present

## 2021-03-12 DIAGNOSIS — I480 Paroxysmal atrial fibrillation: Secondary | ICD-10-CM | POA: Diagnosis present

## 2021-03-12 DIAGNOSIS — Z20822 Contact with and (suspected) exposure to covid-19: Secondary | ICD-10-CM | POA: Diagnosis present

## 2021-03-12 DIAGNOSIS — E440 Moderate protein-calorie malnutrition: Secondary | ICD-10-CM | POA: Diagnosis present

## 2021-03-12 DIAGNOSIS — N184 Chronic kidney disease, stage 4 (severe): Secondary | ICD-10-CM | POA: Diagnosis present

## 2021-03-12 DIAGNOSIS — E876 Hypokalemia: Secondary | ICD-10-CM | POA: Diagnosis present

## 2021-03-12 DIAGNOSIS — Z91138 Patient's unintentional underdosing of medication regimen for other reason: Secondary | ICD-10-CM

## 2021-03-12 LAB — TROPONIN I (HIGH SENSITIVITY)
Troponin I (High Sensitivity): 80 ng/L — ABNORMAL HIGH (ref <18)
Troponin I (High Sensitivity): 81 ng/L — ABNORMAL HIGH (ref <18)

## 2021-03-12 LAB — RESP PANEL BY RT-PCR (FLU A&B, COVID) ARPGX2
Influenza A by PCR: NEGATIVE
Influenza B by PCR: NEGATIVE
SARS Coronavirus 2 by RT PCR: NEGATIVE

## 2021-03-12 LAB — BASIC METABOLIC PANEL
Anion gap: 11 (ref 5–15)
BUN: 59 mg/dL — ABNORMAL HIGH (ref 6–20)
CO2: 24 mmol/L (ref 22–32)
Calcium: 8.1 mg/dL — ABNORMAL LOW (ref 8.9–10.3)
Chloride: 111 mmol/L (ref 98–111)
Creatinine, Ser: 3.2 mg/dL — ABNORMAL HIGH (ref 0.61–1.24)
GFR, Estimated: 22 mL/min — ABNORMAL LOW (ref >60)
Glucose, Bld: 121 mg/dL — ABNORMAL HIGH (ref 70–99)
Potassium: 2.9 mmol/L — ABNORMAL LOW (ref 3.5–5.1)
Sodium: 146 mmol/L — ABNORMAL HIGH (ref 135–145)

## 2021-03-12 LAB — CBC
HCT: 28.3 % — ABNORMAL LOW (ref 39.0–52.0)
Hemoglobin: 9.6 g/dL — ABNORMAL LOW (ref 13.0–17.0)
MCH: 32.1 pg (ref 26.0–34.0)
MCHC: 33.9 g/dL (ref 30.0–36.0)
MCV: 94.6 fL (ref 80.0–100.0)
Platelets: 385 10*3/uL (ref 150–400)
RBC: 2.99 MIL/uL — ABNORMAL LOW (ref 4.22–5.81)
RDW: 13 % (ref 11.5–15.5)
WBC: 8.4 10*3/uL (ref 4.0–10.5)
nRBC: 0 % (ref 0.0–0.2)

## 2021-03-12 LAB — BRAIN NATRIURETIC PEPTIDE: B Natriuretic Peptide: 2701.7 pg/mL — ABNORMAL HIGH (ref 0.0–100.0)

## 2021-03-12 MED ORDER — FUROSEMIDE 10 MG/ML IJ SOLN
6.0000 mg/h | INTRAVENOUS | Status: DC
  Administered 2021-03-12 – 2021-03-13 (×2): 6 mg/h via INTRAVENOUS
  Filled 2021-03-12 (×3): qty 20

## 2021-03-12 MED ORDER — ONDANSETRON HCL 4 MG PO TABS: 4.0000 mg | ORAL_TABLET | Freq: Four times a day (QID) | ORAL | Status: DC | PRN

## 2021-03-12 MED ORDER — ACETAMINOPHEN 325 MG PO TABS: 650.0000 mg | ORAL_TABLET | Freq: Four times a day (QID) | ORAL | Status: AC | PRN

## 2021-03-12 MED ORDER — ADULT MULTIVITAMIN W/MINERALS CH
1.0000 | ORAL_TABLET | Freq: Every day | ORAL | Status: DC
  Administered 2021-03-12 – 2021-03-19 (×8): 1 via ORAL
  Filled 2021-03-12 (×9): qty 1

## 2021-03-12 MED ORDER — ACETAMINOPHEN 650 MG RE SUPP: 650.0000 mg | Freq: Four times a day (QID) | RECTAL | Status: AC | PRN

## 2021-03-12 MED ORDER — HYDRALAZINE HCL 50 MG PO TABS
50.0000 mg | ORAL_TABLET | Freq: Three times a day (TID) | ORAL | Status: DC
  Administered 2021-03-12 – 2021-03-17 (×18): 50 mg via ORAL
  Filled 2021-03-12 (×18): qty 1

## 2021-03-12 MED ORDER — POTASSIUM CHLORIDE 10 MEQ/100ML IV SOLN
10.0000 meq | INTRAVENOUS | Status: AC
  Administered 2021-03-12 (×3): 10 meq via INTRAVENOUS
  Filled 2021-03-12 (×2): qty 100

## 2021-03-12 MED ORDER — CARVEDILOL 25 MG PO TABS
25.0000 mg | ORAL_TABLET | Freq: Two times a day (BID) | ORAL | Status: DC
  Administered 2021-03-13 – 2021-03-17 (×9): 25 mg via ORAL
  Filled 2021-03-12 (×9): qty 1

## 2021-03-12 MED ORDER — ASPIRIN EC 81 MG PO TBEC
81.0000 mg | DELAYED_RELEASE_TABLET | Freq: Every day | ORAL | Status: DC
  Administered 2021-03-12 – 2021-03-19 (×8): 81 mg via ORAL
  Filled 2021-03-12 (×8): qty 1

## 2021-03-12 MED ORDER — ISOSORBIDE MONONITRATE ER 30 MG PO TB24
30.0000 mg | ORAL_TABLET | Freq: Two times a day (BID) | ORAL | Status: DC
  Administered 2021-03-12 – 2021-03-19 (×14): 30 mg via ORAL
  Filled 2021-03-12 (×14): qty 1

## 2021-03-12 MED ORDER — ONDANSETRON HCL 4 MG/2ML IJ SOLN: 4.0000 mg | Freq: Four times a day (QID) | INTRAMUSCULAR | Status: DC | PRN

## 2021-03-12 MED ORDER — ATORVASTATIN CALCIUM 20 MG PO TABS
40.0000 mg | ORAL_TABLET | Freq: Every day | ORAL | Status: DC
  Administered 2021-03-12 – 2021-03-18 (×7): 40 mg via ORAL
  Filled 2021-03-12 (×7): qty 2

## 2021-03-12 MED ORDER — APIXABAN 5 MG PO TABS
5.0000 mg | ORAL_TABLET | Freq: Two times a day (BID) | ORAL | Status: DC
  Administered 2021-03-12 – 2021-03-19 (×14): 5 mg via ORAL
  Filled 2021-03-12 (×15): qty 1

## 2021-03-12 MED ORDER — FERROUS SULFATE 325 (65 FE) MG PO TABS
325.0000 mg | ORAL_TABLET | Freq: Every day | ORAL | Status: DC
  Administered 2021-03-13 – 2021-03-19 (×7): 325 mg via ORAL
  Filled 2021-03-12 (×7): qty 1

## 2021-03-12 NOTE — Progress Notes (Signed)
Pt resting comfortably / call bell within reach/ will continue to monitor.

## 2021-03-12 NOTE — Progress Notes (Signed)
Pt arrived to unit in no distress. VSS.

## 2021-03-12 NOTE — ED Provider Notes (Signed)
[facility] Emergency Department Provider Note  ____________________________________________   Event Date/Time   First MD Initiated Contact with Patient 03/12/21 [phone]     (approximate)  I have reviewed the triage vital signs and the nursing notes.   HISTORY  Chief Complaint Leg Swelling and Shortness of Breath    HPI Nathan [name] is a 60 y.o. male with history of combined heart failure, CKD, diabetes, here with shortness of breath and leg swelling.  The patient states that he ran out of his torsemide approximately a week ago.  Since then, he has had progressively worsening bilateral leg swelling, shortness of breath, and fatigue.  He has had progressive orthopnea.  He states he is gained approximately 20 pounds of weight.  He has been urinating, however, though he states it is less frequent than when he is on Lasix.  Denies any chest pain.  He does have a dry cough that is worse when he lays flat.  He has been generally weak and had difficulty getting around but due to weakness as well as his leg swelling.  No other complaints.  He is been taking his meds as prescribed.    Past Medical History:  Diagnosis Date   Anemia of chronic disease    Chronic combined systolic (congestive) and diastolic (congestive) heart failure ([place])    a. 05/2019 Echo: EF 45-50%, nl LA/RA size. Mild to mod TR.   CKD (chronic kidney disease), stage III ([place])    Diabetes mellitus without complication ([place])    Hypertension    Left Hydropneumothorax/Fibrinopurulent empyema    a. 06/2019 s/p VATS.   PAF (paroxysmal atrial fibrillation) ([place])    a. 05/2019 in setting of sepsis-->s/p TEE/DCCV; b. CHA2DS2VASc = 2-->Eliquis.   Sepsis ([place])    a. 05/2019 MRSA in setting of infected R thumb.    Patient Active Problem List   Diagnosis Date Noted   Acute exacerbation of CHF (congestive heart failure) ([place]) 03/12/2021   Hyperlipidemia 03/12/2021   Proteinuria    CKD stage 4 due to  type 2 diabetes mellitus (HCC)    PAF (paroxysmal atrial fibrillation) ([place])    Diabetes mellitus without complication ([place])    Acute kidney injury superimposed on CKD ([place])    Hydropneumothorax 06/24/2019   Essential hypertension 06/24/2019   Insulin dependent diabetes mellitus type IA ([place]) 06/24/2019   Anemia of chronic disease 06/24/2019   Atrial fibrillation, chronic (HCC) [id number]   Chronic systolic CHF (congestive heart failure) ([place]) 06/24/2019   Cellulitis of right hand 06/24/2019   CKD (chronic kidney disease), stage III ([place])    Empyema ([place])    Sepsis ([place]) 06/10/2019    Past Surgical History:  Procedure Laterality Date   HAND SURGERY Right    I & D EXTREMITY Right 06/11/2019   Procedure: IRRIGATION AND DEBRIDEMENT RIGHT THUMB;  Surgeon: Dereck Leep, MD;  Location: [facility] ORS;  Service: Orthopedics;  Laterality: Right;   TEE WITHOUT CARDIOVERSION N/A 06/14/2019   Procedure: TRANSESOPHAGEAL ECHOCARDIOGRAM (TEE);  Surgeon: Minna Merritts, MD;  Location: [facility] ORS;  Service: Cardiovascular;  Laterality: N/A;   VIDEO ASSISTED THORACOSCOPY (VATS)/THOROCOTOMY Left 06/26/2019   Procedure: [id number] ASSISTED THORACOSCOPY DECORTICATION;  Surgeon: Lajuana Matte, MD;  Location: [facility];  Service: Thoracic;  Laterality: Left;   VIDEO BRONCHOSCOPY N/A 06/26/2019   Procedure: VIDEO BRONCHOSCOPY;  Surgeon: Lajuana Matte, MD;  Location: [facility];  Service: Thoracic;  Laterality: N/A;    Prior to Admission medications  Medication Sig Start Date End Date Taking? Authorizing Provider  acetaminophen (TYLENOL) 500 MG tablet Take 500-1,000 mg by mouth every 6 (six) hours as needed for mild pain or moderate pain.   Yes [provider]  aspirin EC 81 MG tablet Take 81 mg by mouth daily.   Yes [provider]  atorvastatin (LIPITOR) 40 MG tablet Take 40 mg by mouth daily.   Yes [provider]  carvedilol (COREG) 25 MG tablet Take 1 tablet by mouth twice  daily Patient taking differently: Take 25 mg by mouth 2 (two) times daily. 12/31/20  Yes Minna Merritts, MD  ferrous sulfate 325 (65 FE) MG tablet Take 1 tablet (325 mg total) by mouth daily with breakfast. 12/04/20  Yes Wieting, Richard, MD  glipiZIDE (GLUCOTROL XL) 2.5 MG 24 hr tablet Take 2.5 mg by mouth daily as needed (elevated blood glucose).   Yes [provider]  hydrALAZINE (APRESOLINE) 50 MG tablet Take 2 tablets (100 mg total) by mouth 3 (three) times daily. Patient taking differently: Take 50 mg by mouth 3 (three) times daily. 12/04/20  Yes Wieting, Richard, MD  isosorbide mononitrate (IMDUR) 30 MG 24 hr tablet Take 1 tablet (30 mg total) by mouth 2 (two) times daily. 07/21/20  Yes Minna Merritts, MD  Multiple Vitamin (MULTIVITAMIN) tablet Take 1 tablet by mouth daily.   Yes [provider]  torsemide (DEMADEX) 20 MG tablet Take 2 tablets (40 mg total) by mouth daily. 07/21/20  Yes Gollan, Kathlene November, MD  apixaban (ELIQUIS) 5 MG TABS tablet Take 1 tablet (5 mg total) by mouth 2 (two) times daily. Patient not taking: Reported on 03/12/2021 09/03/19   Rise Mu, PA-C    Allergies Patient has no known allergies.  Family History  Problem Relation Age of Onset   CAD Father    Diabetes Sister    Diabetes Brother    Diabetes Sister    Diabetes Sister     Social History Social History   Tobacco Use   Smoking status: Never   Smokeless tobacco: Never  Vaping Use   Vaping Use: Never used  Substance Use Topics   Alcohol use: Never   Drug use: Never    Review of Systems  Review of Systems  Constitutional:  Positive for fatigue. Negative for chills and fever.  HENT:  Negative for sore throat.   Respiratory:  Negative for shortness of breath.   Cardiovascular:  Negative for chest pain.  Gastrointestinal:  Negative for abdominal pain.  Genitourinary:  Negative for flank pain.  Musculoskeletal:  Negative for neck pain.  Skin:  Negative for rash and wound.   Allergic/Immunologic: Negative for immunocompromised state.  Neurological:  Positive for weakness. Negative for numbness.  Hematological:  Does not bruise/bleed easily.    ____________________________________________  PHYSICAL EXAM:      VITAL SIGNS: ED Triage Vitals  Enc Vitals Group     BP 03/12/21 0751 (!) 175/81     Pulse Rate 03/12/21 0751 79     Resp 03/12/21 0751 20     Temp 03/12/21 0751 (!) 97.5 F (36.4 C)     Temp Source 03/12/21 0751 Oral     SpO2 03/12/21 0751 95 %     Weight 03/12/21 0745 190 lb (86.2 kg)     Height 03/12/21 0745 '5\' 9"'$  (1.753 m)     Head Circumference --      Peak Flow --      Pain Score 03/12/21 0744  0     Pain Loc --      Pain Edu? --      Excl. in [place]? --      Physical Exam Vitals and nursing note reviewed.  Constitutional:      General: He is not in acute distress.    Appearance: He is well-developed.  HENT:     Head: Normocephalic and atraumatic.  Eyes:     Conjunctiva/sclera: Conjunctivae normal.  Cardiovascular:     Rate and Rhythm: Normal rate and regular rhythm.     Heart sounds: Normal heart sounds. No murmur heard.   No friction rub.  Pulmonary:     Effort: Pulmonary effort is normal. No respiratory distress.     Breath sounds: Normal breath sounds. No wheezing or rales.  Abdominal:     General: There is no distension.     Palpations: Abdomen is soft.     Tenderness: There is no abdominal tenderness.  Musculoskeletal:     Cervical back: Neck supple.  Skin:    General: Skin is warm.     Capillary Refill: Capillary refill takes less than 2 seconds.  Neurological:     Mental Status: He is alert and oriented to person, place, and time.     Motor: No abnormal muscle tone.      ____________________________________________   LABS (all labs ordered are listed, but only abnormal results are displayed)  Labs Reviewed  BASIC METABOLIC PANEL - Abnormal; Notable for the following components:      Result Value   Sodium  146 (*)    Potassium 2.9 (*)    Glucose, Bld 121 (*)    BUN 59 (*)    Creatinine, Ser 3.20 (*)    Calcium 8.1 (*)    GFR, Estimated 22 (*)    All other components within normal limits  CBC - Abnormal; Notable for the following components:   RBC 2.99 (*)    Hemoglobin 9.6 (*)    HCT 28.3 (*)    All other components within normal limits  BRAIN NATRIURETIC PEPTIDE - Abnormal; Notable for the following components:   B Natriuretic Peptide 2,701.7 (*)    All other components within normal limits  TROPONIN I (HIGH SENSITIVITY) - Abnormal; Notable for the following components:   Troponin I (High Sensitivity) 80 (*)    All other components within normal limits  RESP PANEL BY RT-PCR (FLU A&B, COVID) [id number]  TROPONIN I (HIGH SENSITIVITY)    ____________________________________________  EKG: Normal sinus rhythm with occasional PVCs.  Ventricular rate 79.  PR 136, QRS 102, QTc 460.  Possible right ventricular hypertrophy.  No acute ST elevations or depressions. ________________________________________  RADIOLOGY All imaging, including plain films, CT scans, and ultrasounds, independently reviewed by me, and interpretations confirmed via formal radiology reads.  ED MD interpretation:   Chest x-ray: Cardiomegaly with bilateral interstitial prominence, left base atelectasis and consolidation  Official radiology report(s): DG Chest 2 View  Result Date: 03/12/2021 CLINICAL DATA:  Shortness of breath. EXAM: CHEST - 2 VIEW COMPARISON:  12/03/2020. FINDINGS: Mediastinum and hilar structures normal. Cardiomegaly. Bilateral interstitial prominence consistent with interstitial edema and or pneumonitis. Left base atelectasis and consolidation. Small left pleural effusion. No pneumothorax. IMPRESSION: 1. Cardiomegaly with bilateral interstitial prominence. Interstitial prominence consistent interstitial edema and or pneumonitis. 2. Left base atelectasis and consolidation. Left base pneumonia cannot be  excluded. Small left pleural effusion. Electronically Signed   By: Marcello Moores  Register   On: 03/12/2021 08:27  ____________________________________________  PROCEDURES   Procedure(s) performed (including Critical Care):  Procedures  ____________________________________________  INITIAL IMPRESSION / MDM / [place] / ED COURSE  As part of my medical decision making, I reviewed the following data within the [facility] notes reviewed and incorporated, Old chart reviewed, Notes from prior ED visits, and [place] Controlled Substance Database       *Nathan [name] was evaluated in Emergency Department on 03/12/2021 for the symptoms described in the history of present illness. He was evaluated in the context of the global COVID-19 pandemic, which necessitated consideration that the patient might be at risk for infection with the SARS-CoV-2 virus that causes COVID-19. Institutional protocols and algorithms that pertain to the evaluation of patients at risk for COVID-19 are in a state of rapid change based on information released by regulatory bodies including the CDC and federal and state organizations. These policies and algorithms were followed during the patient's care in the ED.  Some ED evaluations and interventions may be delayed as a result of limited staffing during the pandemic.*     Medical Decision Making: 60 year old male here with shortness of breath and leg swelling.  Patient has diffuse anasarca and overt hypervolemia on exam.  His lab work shows baseline CKD, as well as elevated BNP and troponin consistent with his significant anasarca.  Chest x-ray shows bilateral interstitial prominence and edema.  X-ray read as possible consolidation, though he has had no fever, leukocytosis, or infectious symptoms.  EKG nonischemic.  Will admit for cautious diuresis in the setting of his CKD and further  work-up.  ____________________________________________  FINAL CLINICAL IMPRESSION(S) / ED DIAGNOSES  Final diagnoses:  Stage 4 chronic kidney disease ([place])  Anasarca     MEDICATIONS GIVEN DURING THIS VISIT:  Medications  acetaminophen (TYLENOL) tablet 650 mg (has no administration in time range)    Or  acetaminophen (TYLENOL) suppository 650 mg (has no administration in time range)  ondansetron (ZOFRAN) tablet 4 mg (has no administration in time range)    Or  ondansetron (ZOFRAN) injection 4 mg (has no administration in time range)  potassium chloride 10 mEq in 100 mL IVPB (10 mEq Intravenous New Bag/Given 03/12/21 1117)  aspirin EC tablet 81 mg (81 mg Oral Given 03/12/21 1054)  atorvastatin (LIPITOR) tablet 40 mg (has no administration in time range)  carvedilol (COREG) tablet 25 mg (has no administration in time range)  hydrALAZINE (APRESOLINE) tablet 50 mg (50 mg Oral Given 03/12/21 1054)  isosorbide mononitrate (IMDUR) 24 hr tablet 30 mg (has no administration in time range)  apixaban (ELIQUIS) tablet 5 mg (5 mg Oral Not Given 03/12/21 1054)  ferrous sulfate tablet 325 mg (has no administration in time range)  multivitamin with minerals tablet 1 tablet (1 tablet Oral Given 03/12/21 1054)  furosemide (LASIX) 200 mg in dextrose 5 % 100 mL (2 mg/mL) infusion (has no administration in time range)     ED Discharge Orders     None        Note:  This document was prepared using Dragon voice recognition software and may include unintentional dictation errors.   Duffy Bruce, MD 03/12/21 1136

## 2021-03-12 NOTE — Progress Notes (Signed)
[facility]  ROUNDING NOTE   Subjective:   [name] was admitted to [facility] on 03/12/2021 for Acute exacerbation of CHF (congestive heart failure) ([place]) [I50.9]  Patient was out of his diuretics for approximately one week. He states prior to this he has been gaining weight and retaining fluid. He has kept a fluid restriction and salt restriction.   Wife at bedside.    Objective:  Vital signs in last 24 hours:  Temp:  [97.5 F (36.4 C)-97.6 F (36.4 C)] 97.6 F (36.4 C) (06/23 1033) Pulse Rate:  [75-79] 76 (06/23 1033) Resp:  [13-20] 13 (06/23 1033) BP: (175-184)/(81-91) 176/91 (06/23 1033) SpO2:  [95 %-99 %] 96 % (06/23 1033) Weight:  [86.2 kg] 86.2 kg (06/23 0745)  Weight change:  Filed Weights   03/12/21 0745  Weight: 86.2 kg    Intake/Output: No intake/output data recorded.   Intake/Output this shift:  No intake/output data recorded.  Physical Exam: General: NAD,   Head: Normocephalic, atraumatic. Moist oral mucosal membranes  Eyes: Anicteric, PERRL  Neck: Supple, trachea midline  Lungs:  Clear to auscultation  Heart: Regular rate and rhythm  Abdomen:  Soft, nontender, +abdominal wall edema  Extremities: +++ peripheral edema.  Neurologic: Nonfocal, moving all four extremities  Skin: No lesions       Basic Metabolic Panel: Recent Labs  Lab 03/12/21 0751  NA 146*  K 2.9*  CL 111  CO2 24  GLUCOSE 121*  BUN 59*  CREATININE 3.20*  CALCIUM 8.1*    Liver Function Tests: No results for input(s): AST, ALT, ALKPHOS, BILITOT, PROT, ALBUMIN in the last 168 hours. No results for input(s): LIPASE, AMYLASE in the last 168 hours. No results for input(s): AMMONIA in the last 168 hours.  CBC: Recent Labs  Lab 03/12/21 0751  WBC 8.4  HGB 9.6*  HCT 28.3*  MCV 94.6  PLT 385    Cardiac Enzymes: No results for input(s): CKTOTAL, CKMB, CKMBINDEX, TROPONINI in the last 168 hours.  BNP: Invalid input(s): POCBNP  CBG: No results  for input(s): GLUCAP in the last 168 hours.  Microbiology: Results for orders placed or performed during the hospital encounter of 12/03/20  Resp Panel by RT-PCR (Flu A&B, Covid) Nasopharyngeal Swab     Status: None   Collection Time: 12/03/20  3:28 PM   Specimen: Nasopharyngeal Swab; Nasopharyngeal(NP) swabs in vial transport medium  Result Value Ref Range Status   SARS Coronavirus 2 by RT PCR NEGATIVE NEGATIVE Final    Comment: (NOTE) SARS-CoV-2 target nucleic acids are NOT DETECTED.  The SARS-CoV-2 RNA is generally detectable in upper respiratory specimens during the acute phase of infection. The lowest concentration of SARS-CoV-2 viral copies this assay can detect is 138 copies/mL. A negative result does not preclude SARS-Cov-2 infection and should not be used as the sole basis for treatment or other patient management decisions. A negative result may occur with  improper specimen collection/handling, submission of specimen other than nasopharyngeal swab, presence of viral mutation(s) within the areas targeted by this assay, and inadequate number of viral copies(<138 copies/mL). A negative result must be combined with clinical observations, patient history, and epidemiological information. The expected result is Negative.  Fact Sheet for Patients:  EntrepreneurPulse.com.au  Fact Sheet for Healthcare Providers:  IncredibleEmployment.be  This test is no t yet approved or cleared by the [place] FDA and  has been authorized for detection and/or diagnosis of SARS-CoV-2 by FDA under an Emergency Use Authorization (EUA). This EUA  will remain  in effect (meaning this test can be used) for the duration of the COVID-19 declaration under Section 564(b)(1) of the Act, 21 U.S.C.section 360bbb-3(b)(1), unless the authorization is terminated  or revoked sooner.       Influenza A by PCR NEGATIVE NEGATIVE Final   Influenza B by PCR NEGATIVE  NEGATIVE Final    Comment: (NOTE) The Xpert Xpress SARS-CoV-2/FLU/RSV plus assay is intended as an aid in the diagnosis of influenza from Nasopharyngeal swab specimens and should not be used as a sole basis for treatment. Nasal washings and aspirates are unacceptable for Xpert Xpress SARS-CoV-2/FLU/RSV testing.  Fact Sheet for Patients: EntrepreneurPulse.com.au  Fact Sheet for Healthcare Providers: IncredibleEmployment.be  This test is not yet approved or cleared by the [place] FDA and has been authorized for detection and/or diagnosis of SARS-CoV-2 by FDA under an Emergency Use Authorization (EUA). This EUA will remain in effect (meaning this test can be used) for the duration of the COVID-19 declaration under Section 564(b)(1) of the Act, 21 U.S.C. section 360bbb-3(b)(1), unless the authorization is terminated or revoked.  Performed at [facility], [address] [place], [place] [address]     Coagulation Studies: No results for input(s): LABPROT, INR in the last 72 hours.  Urinalysis: No results for input(s): COLORURINE, LABSPEC, PHURINE, GLUCOSEU, HGBUR, BILIRUBINUR, KETONESUR, PROTEINUR, UROBILINOGEN, NITRITE, LEUKOCYTESUR in the last 72 hours.  Invalid input(s): APPERANCEUR    Imaging: DG Chest 2 View  Result Date: 03/12/2021 CLINICAL DATA:  Shortness of breath. EXAM: CHEST - 2 VIEW COMPARISON:  12/03/2020. FINDINGS: Mediastinum and hilar structures normal. Cardiomegaly. Bilateral interstitial prominence consistent with interstitial edema and or pneumonitis. Left base atelectasis and consolidation. Small left pleural effusion. No pneumothorax. IMPRESSION: 1. Cardiomegaly with bilateral interstitial prominence. Interstitial prominence consistent interstitial edema and or pneumonitis. 2. Left base atelectasis and consolidation. Left base pneumonia cannot be excluded. Small left pleural effusion. Electronically Signed    By: Marcello Moores  Register   On: 03/12/2021 08:27     Medications:    potassium chloride      apixaban  5 mg Oral BID   aspirin EC  81 mg Oral Daily   atorvastatin  40 mg Oral QHS   [START ON 03/13/2021] carvedilol  25 mg Oral BID   [START ON 03/13/2021] ferrous sulfate  325 mg Oral Q breakfast   hydrALAZINE  50 mg Oral TID   isosorbide mononitrate  30 mg Oral BID   multivitamin with minerals  1 tablet Oral Daily   acetaminophen **OR** acetaminophen, ondansetron **OR** ondansetron (ZOFRAN) IV  Assessment/ Plan:  [name] is a 60 y.o. white male hypertension, hyperlipidemia, congestive heart failure, diabetes mellitus type II, atrial fibrillation, COPD who is admitted to [facility] on 03/12/2021 for Acute exacerbation of CHF (congestive heart failure) ([place]) [I50.9]  Acute kidney injury on chronic kidney disease stage IV: with proteinuria. Baseline creatinine of 2.72, GFR of 25 on 11/05/2020. Followed by [facility], Dr. Lanora Manis. Chronic kidney disease secondary to diabetes and hypertension. Acute kidney injury secondary to acute cardiorenal syndrome.   Hypertension: elevated 180/94. Volume driven Home regimen of torsemide, carvedilol, isosorbide mononitrate and hydralazine.   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 Start furosemide gtt. Change to low salt diet and consult registered dietician. Fluid retention.  Anemia with chronic kidney disease: hemoglobin 9.6, normocytic. History of iron deficiency. Check iron studies.     LOS: 0 Timia Casselman 6/23/202211:15 AM

## 2021-03-12 NOTE — ED Triage Notes (Signed)
Pt reports ran out of his fluids pills last Friday and since then the swelling in his legs has increased and he feels more SOB and chest heavy.

## 2021-03-12 NOTE — H&P (Addendum)
History and Physical   Nathan [name] ZYY:[id number] DOB: Nov 07, 1960 DOA: 03/12/2021  PCP: Derinda Late, MD  Outpatient Specialists: Dr. Lanora Manis, [facility]  Patient coming from: home   I have personally briefly reviewed patient's old medical records in [place].  Chief Concern: shortness of breath  HPI: Nathan [name] is a 60 y.o. male with medical history significant for hypertension, hyperlipidemia, paf on eliquis, CKD4, combined heart failure, medication noncompliance presents to the emergency department for chief concern of shortness of breath.  He reports that shortness of breath started 03/11/2021.  He denies fever.  He endorses chronic cough that is nonproductive and unchanged from baseline.  He states that for over a year he has not been able to sleep laying down.  He states the shortness of breath is worse with exertion.  He denies chest pain, abdominal pain, diarrhea, dysuria, hematuria.  He reports he is still making urine.  He does endorse that he feels like his Lasix medication is not working as well as it used to.  He reports that he ran out of his fluid medication since Friday, 03/06/2021.  He endorses that he has gained at least 10 pounds since last week.  He reports that he has not been able to call his cardiology to have it refilled.  He spoke with the pharmacy and they said that they would call to get it refilled for him however it has not been refilled.  Social history: Lives with spouse, he is currently seeking disability.  He denies tobacco, EtOH, recreational drug use.  Vaccination history: He is vaccinated for COVID-19, 2 doses.  He does not know if it is Surveyor, minerals.  ROS: Constitutional: + weight change, no fever ENT/Mouth: no sore throat, no rhinorrhea Eyes: no eye pain, no vision changes Cardiovascular: no chest pain, + dyspnea,  + edema, no palpitations Respiratory: + cough, no sputum, no wheezing Gastrointestinal: no  nausea, no vomiting, no diarrhea, no constipation Genitourinary: no urinary incontinence, no dysuria, no hematuria Musculoskeletal: no arthralgias, no myalgias Skin: no skin lesions, no pruritus, Neuro: + weakness, no loss of consciousness, no syncope Psych: no anxiety, no depression, + decrease appetite Heme/Lymph: no bruising, no bleeding  ED Course: Discussed with emergency medicine provider, patient requiring hospitalization for heart failure exacerbation secondary to running out of medications.  Vitals in the emergency department was remarkable for temperature 97.5, respiration rate of 20, heart rate 79, blood pressure 175/81, SPO2 of 95% on room air.  Labs in the emergency department was remarkable for potassium 2.9, chloride 111, bicarb 24, BUN 59, serum creatinine 3.20, nonfasting blood glucose 121.  eGFR 22.  Troponin initially 80.  WBC 8.4, hemoglobin 9.6, platelets 385.  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 Juleen China who states he will place patient on a Lasix gtt. - We will await nephrology consultation  #  Acute kidney injury-secondary to cardiorenal in setting of medication unavailability - Serum creatinine on presentation is 3.20, BUN 59 - Baseline serum creatinine is 1.93 2.69/CKD 4 - BMP in the a.m. - Treat as above  # Hypokalemia-secondary to acute  kidney injury, replace with 10 mEq potassium chloride IV, 1 dose every hour for 5 doses - BMP in the a.m.  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-in acute exacerbation secondary to medication unavailability - Echo on 09/02/2020 showed ejection fraction 30 to 35%, left ventricular global hypokinesis, grade 2 diastolic dysfunction, right ventricle systolic function is moderately decreased, left and right atrial moderately dilated - Resumed Coreg 25 mg twice daily, isosorbide mononitrate 30 mg in the morning and evening resumed Hypertension-resumed Coreg 25 mg twice daily starting dose 03/13/2021 due to acute volume overload at this time - Hydralazine 50 mg 3 times daily resumed  # Hyperlipidemia-atorvastatin 40 mg nightly resumed # Paroxysmal atrial fibrillation-Eliquis 5 mg twice daily resumed # 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   DVT prophylaxis: Eliquis 5 mg twice daily Code Status: Full code Diet: Heart healthy and renal Family Communication: Updated spouse at bedside Disposition Plan: Pending nephrology evaluation Consults called: nephrology, Dr. Juleen China Admission status: MedSurg, ops, with telemetry  Past Medical History:  Diagnosis Date   Anemia of chronic disease    Chronic combined systolic (congestive) and diastolic (congestive) heart failure ([place])    a. 05/2019 Echo: EF 45-50%, nl LA/RA size. Mild to mod TR.   CKD (chronic kidney disease), stage III ([place])    Diabetes mellitus without complication ([place])    Hypertension    Left Hydropneumothorax/Fibrinopurulent empyema    a. 06/2019 s/p VATS.   PAF (paroxysmal atrial fibrillation) ([place])    a. 05/2019 in setting of sepsis-->s/p TEE/DCCV; b. CHA2DS2VASc = 2-->Eliquis.   Sepsis ([place])    a. 05/2019 MRSA in setting of  infected R thumb.   Past Surgical History:  Procedure Laterality Date   HAND SURGERY Right    I & D EXTREMITY Right 06/11/2019   Procedure: IRRIGATION AND DEBRIDEMENT RIGHT THUMB;  Surgeon: Dereck Leep, MD;  Location: [facility] ORS;  Service: Orthopedics;  Laterality: Right;   TEE WITHOUT CARDIOVERSION N/A 06/14/2019   Procedure: TRANSESOPHAGEAL ECHOCARDIOGRAM (TEE);  Surgeon: Minna Merritts, MD;  Location: [facility] OR[facility];  Service: Cardiovascular;  Laterality: N/A;   VIDEO ASSISTED THORACOSCOPY (VATS)/THOROCOTOMY Left 06/26/2019   Procedure: VIDEO ASSISTED THORACOSCOPY DECORTICATION;  Surgeon: Lajuana Matte, MD;  Location: [place];  Service: Thoracic;  Laterality: Left;   VIDEO BRONCHOSCOPY N/A 06/26/2019   Procedure: VIDEO BRONCHOSCOPY;  Surgeon: Lajuana Matte, MD;  Location: [place];  Service: Thoracic;  Laterality: N/A;   Social History:  reports that he has never smoked. He has never used smokeless tobacco. He reports that he does not drink alcohol and does not use drugs.  No Known Allergies Family History  Problem Relation Age of Onset   CAD Father    Diabetes Sister    Diabetes Brother    Diabetes Sister    Diabetes Sister    Family history: Family history reviewed and not pertinent  Prior to Admission medications   Medication Sig Start Date End Date Taking? Authorizing Provider  apixaban (ELIQUIS) 5 MG TABS tablet Take 1 tablet (5 mg total) by mouth 2 (two) times daily. 09/03/19  Rise Mu, PA-C  atorvastatin (LIPITOR) 40 MG tablet Take 40 mg by mouth daily. 04/03/20   [provider]  carvedilol (COREG) 25 MG tablet Take 1 tablet by mouth twice daily 12/31/20   Minna Merritts, MD  ferrous sulfate 325 (65 FE) MG tablet Take 1 tablet (325 mg total) by mouth daily with breakfast. 12/04/20   Loletha Grayer, MD  hydrALAZINE (APRESOLINE) 50 MG tablet Take 2 tablets (100 mg total) by mouth 3 (three) times daily. 12/04/20   Loletha Grayer, MD  isosorbide  mononitrate (IMDUR) 30 MG 24 hr tablet Take 1 tablet (30 mg total) by mouth 2 (two) times daily. 07/21/20   Minna Merritts, MD  Multiple Vitamin (MULTIVITAMIN) tablet Take 1 tablet by mouth daily.    [provider]  Potassium Chloride ER 20 MEQ TBCR Take 20 mEq by mouth every Monday, Wednesday, and Friday. 01/25/20 07/21/20  Minna Merritts, MD  torsemide (DEMADEX) 20 MG tablet Take 2 tablets (40 mg total) by mouth daily. 07/21/20   Minna Merritts, MD   Physical Exam: Vitals:   03/12/21 0745 03/12/21 0751 03/12/21 0900  BP:  (!) 175/81 (!) 176/88  Pulse:  79 76  Resp:  20 20  Temp:  (!) 97.5 F (36.4 C)   TempSrc:  Oral   SpO2:  95% 99%  Weight: 86.2 kg    Height: 5' 9"  (1.753 m)     Constitutional: appears older than chronological age, NAD, calm, comfortable Eyes: PERRL, lids and conjunctivae normal ENMT: Mucous membranes are moist. Posterior pharynx clear of any exudate or lesions. Age-appropriate dentition. Hearing appropriate Neck: normal, supple, no masses, no thyromegaly Respiratory: Decreased breath sounds of the left lower lobe with crackles. no wheezing. Normal respiratory effort. No accessory muscle use.  Cardiovascular: Regular rate and rhythm, no murmurs / rubs / gallops. 3+ bilateral pitting edema to the upper thighs. 2+ pedal pulses. No carotid bruits.  Abdomen: no tenderness, no masses palpated, no hepatosplenomegaly. Bowel sounds positive.  Musculoskeletal: no clubbing / cyanosis. No joint deformity upper and lower extremities. Good ROM, no contractures, no atrophy. Normal muscle tone.  Skin: no rashes, lesions, ulcers. No induration Neurologic: Sensation intact. Strength 5/5 in all 4.  Psychiatric: Normal judgment and insight. Alert and oriented x 3. Normal mood.   EKG: independently reviewed, showing sinus rhythm with rate of 79, occasional PVC, qtc 460  Chest x-ray on Admission: I personally reviewed and I agree with radiologist reading as below.  DG  Chest 2 View  Result Date: 03/12/2021 CLINICAL DATA:  Shortness of breath. EXAM: CHEST - 2 VIEW COMPARISON:  12/03/2020. FINDINGS: Mediastinum and hilar structures normal. Cardiomegaly. Bilateral interstitial prominence consistent with interstitial edema and or pneumonitis. Left base atelectasis and consolidation. Small left pleural effusion. No pneumothorax. IMPRESSION: 1. Cardiomegaly with bilateral interstitial prominence. Interstitial prominence consistent interstitial edema and or pneumonitis. 2. Left base atelectasis and consolidation. Left base pneumonia cannot be excluded. Small left pleural effusion. Electronically Signed   By: Marcello Moores  Register   On: 03/12/2021 08:27    Labs on Admission: I have personally reviewed following labs  CBC: Recent Labs  Lab 03/12/21 0751  WBC 8.4  HGB 9.6*  HCT 28.3*  MCV 94.6  PLT 127   Basic Metabolic Panel: Recent Labs  Lab 03/12/21 0751  NA 146*  K 2.9*  CL 111  CO2 24  GLUCOSE 121*  BUN 59*  CREATININE 3.20*  CALCIUM 8.1*   GFR: Estimated Creatinine Clearance:  27.4 mL/min (A) (by C-G formula based on SCr of 3.2 mg/dL (H)).  Urine analysis:    Component Value Date/Time   COLORURINE AMBER (A) 12/03/2020 1950   APPEARANCEUR CLEAR (A) 12/03/2020 1950   LABSPEC 1.013 12/03/2020 1950   PHURINE 5.0 12/03/2020 1950   GLUCOSEU 150 (A) 12/03/2020 1950   HGBUR NEGATIVE 12/03/2020 1950   BILIRUBINUR NEGATIVE 12/03/2020 [place] NEGATIVE 12/03/2020 1950   PROTEINUR >=300 (A) 12/03/2020 1950   NITRITE NEGATIVE 12/03/2020 1950   LEUKOCYTESUR NEGATIVE 12/03/2020 1950   Dr. Tobie Poet [facility]  If 7PM-7AM, please contact overnight-coverage provider If 7AM-7PM, please contact day coverage provider [email or web address]  03/12/2021, 10:14 AM

## 2021-03-12 NOTE — ED Notes (Signed)
Pt states has been out of torsemide for 6 days, has not taken for 5 days. Pt c/o bilateral leg edema and generalized edema as well as SOB. 95% RA.

## 2021-03-13 DIAGNOSIS — I4891 Unspecified atrial fibrillation: Secondary | ICD-10-CM

## 2021-03-13 DIAGNOSIS — I5023 Acute on chronic systolic (congestive) heart failure: Secondary | ICD-10-CM

## 2021-03-13 DIAGNOSIS — D649 Anemia, unspecified: Secondary | ICD-10-CM

## 2021-03-13 DIAGNOSIS — N183 Chronic kidney disease, stage 3 unspecified: Secondary | ICD-10-CM

## 2021-03-13 DIAGNOSIS — E876 Hypokalemia: Secondary | ICD-10-CM

## 2021-03-13 DIAGNOSIS — I10 Essential (primary) hypertension: Secondary | ICD-10-CM

## 2021-03-13 LAB — CBC
HCT: 27.5 % — ABNORMAL LOW (ref 39.0–52.0)
Hemoglobin: 9.3 g/dL — ABNORMAL LOW (ref 13.0–17.0)
MCH: 31.6 pg (ref 26.0–34.0)
MCHC: 33.8 g/dL (ref 30.0–36.0)
MCV: 93.5 fL (ref 80.0–100.0)
Platelets: 320 10*3/uL (ref 150–400)
RBC: 2.94 MIL/uL — ABNORMAL LOW (ref 4.22–5.81)
RDW: 13 % (ref 11.5–15.5)
WBC: 7.8 10*3/uL (ref 4.0–10.5)
nRBC: 0 % (ref 0.0–0.2)

## 2021-03-13 LAB — GLUCOSE, CAPILLARY
Glucose-Capillary: 190 mg/dL — ABNORMAL HIGH (ref 70–99)
Glucose-Capillary: 256 mg/dL — ABNORMAL HIGH (ref 70–99)

## 2021-03-13 LAB — BASIC METABOLIC PANEL
Anion gap: 6 (ref 5–15)
BUN: 62 mg/dL — ABNORMAL HIGH (ref 6–20)
CO2: 26 mmol/L (ref 22–32)
Calcium: 7.8 mg/dL — ABNORMAL LOW (ref 8.9–10.3)
Chloride: 114 mmol/L — ABNORMAL HIGH (ref 98–111)
Creatinine, Ser: 3.21 mg/dL — ABNORMAL HIGH (ref 0.61–1.24)
GFR, Estimated: 22 mL/min — ABNORMAL LOW (ref >60)
Glucose, Bld: 140 mg/dL — ABNORMAL HIGH (ref 70–99)
Potassium: 3.1 mmol/L — ABNORMAL LOW (ref 3.5–5.1)
Sodium: 146 mmol/L — ABNORMAL HIGH (ref 135–145)

## 2021-03-13 LAB — IRON AND TIBC
Iron: 66 ug/dL (ref 45–182)
Saturation Ratios: 28 % (ref 17.9–39.5)
TIBC: 235 ug/dL — ABNORMAL LOW (ref 250–450)
UIBC: 169 ug/dL

## 2021-03-13 LAB — FERRITIN: Ferritin: 103 ng/mL (ref 24–336)

## 2021-03-13 MED ORDER — POTASSIUM CHLORIDE CRYS ER 20 MEQ PO TBCR
40.0000 meq | EXTENDED_RELEASE_TABLET | Freq: Two times a day (BID) | ORAL | Status: DC
  Administered 2021-03-13 – 2021-03-16 (×6): 40 meq via ORAL
  Filled 2021-03-13 (×7): qty 2

## 2021-03-13 MED ORDER — NEPRO/CARBSTEADY PO LIQD
237.0000 mL | Freq: Two times a day (BID) | ORAL | Status: DC
  Administered 2021-03-14 – 2021-03-17 (×5): 237 mL via ORAL

## 2021-03-13 MED ORDER — INSULIN ASPART 100 UNIT/ML IJ SOLN
0.0000 [IU] | Freq: Three times a day (TID) | INTRAMUSCULAR | Status: DC
  Administered 2021-03-13: 2 [IU] via SUBCUTANEOUS
  Administered 2021-03-14 (×2): 1 [IU] via SUBCUTANEOUS
  Administered 2021-03-14: 2 [IU] via SUBCUTANEOUS
  Administered 2021-03-16: 1 [IU] via SUBCUTANEOUS
  Administered 2021-03-16 – 2021-03-17 (×3): 2 [IU] via SUBCUTANEOUS
  Administered 2021-03-17: 1 [IU] via SUBCUTANEOUS
  Administered 2021-03-17: 2 [IU] via SUBCUTANEOUS
  Administered 2021-03-18: 1 [IU] via SUBCUTANEOUS
  Administered 2021-03-18 – 2021-03-19 (×2): 2 [IU] via SUBCUTANEOUS
  Administered 2021-03-19: 1 [IU] via SUBCUTANEOUS
  Filled 2021-03-13 (×14): qty 1

## 2021-03-13 NOTE — Progress Notes (Signed)
PROGRESS NOTE    Nathan [name]  [id number] DOB: 12-29-1960 DOA: 03/12/2021 PCP: Derinda Late, MD    Brief Narrative:  Nathan [name] is a 60 y.o. male with medical history significant for hypertension, hyperlipidemia, paf on eliquis, CKD4, combined heart failure, medication noncompliance presents to the emergency department for chief concern of shortness of breath.  6/24 patient endorsed 20 pound weight gain within the last week to me this AM.  Patient is wearing pants and socks and I am unable to evaluate his legs well, when I asked him if he would be willing to change into hospital gown he refused.  I do notice he has edema up to his thighs.  Consultants:  Cardiology, nephrology  Procedures:   Antimicrobials:      Subjective: Still with short of breath and he feels like Lasix is not doing its job quickly.  No chest pain  Objective: Vitals:   03/12/21 2300 03/12/21 2312 03/13/21 0433 03/13/21 0730  BP:  (!) 193/95 (!) 158/90 (!) 185/98  Pulse:  76 82 78  Resp:  13 14   Temp:  97.8 F (36.6 C) 97.9 F (36.6 C)   TempSrc:  Oral Oral   SpO2:  99% 96% 98%  Weight: 89.4 kg     Height: '5\' 9"'$  (1.753 m)       Intake/Output Summary (Last 24 hours) at 03/13/2021 0745 Last data filed at 03/13/2021 0650 Gross per 24 hour  Intake --  Output 850 ml  Net -850 ml   Filed Weights   03/12/21 0745 03/12/21 2300  Weight: 86.2 kg 89.4 kg    Examination:  General exam: Appears calm and comfortable  Respiratory system: Positive Rales, decreased breath sounds at bases  Cardiovascular system: S1 & S2 heard, RRR. No no gallops Gastrointestinal system: Abdomen is nondistended, soft and nontender. Normal bowel sounds heard. Central nervous system: Alert and oriented. Grossly intact Extremities: +edema by feeling  , unable to see due to pt refusing to wear hospital gown as he is covered up Skin: Warm dry     Data Reviewed: I have personally reviewed following labs and  imaging studies  CBC: Recent Labs  Lab 03/12/21 0751 03/13/21 0427  WBC 8.4 7.8  HGB 9.6* 9.3*  HCT 28.3* 27.5*  MCV 94.6 93.5  PLT 385 [id number]   Basic Metabolic Panel: Recent Labs  Lab 03/12/21 0751 03/13/21 0427  NA 146* 146*  K 2.9* 3.1*  CL 111 114*  CO2 24 26  GLUCOSE 121* 140*  BUN 59* 62*  CREATININE 3.20* 3.21*  CALCIUM 8.1* 7.8*   GFR: Estimated Creatinine Clearance: 27.7 mL/min (A) (by C-G formula based on SCr of 3.21 mg/dL (H)). Liver Function Tests: No results for input(s): AST, ALT, ALKPHOS, BILITOT, PROT, ALBUMIN in the last 168 hours. No results for input(s): LIPASE, AMYLASE in the last 168 hours. No results for input(s): AMMONIA in the last 168 hours. Coagulation Profile: No results for input(s): INR, PROTIME in the last 168 hours. Cardiac Enzymes: No results for input(s): CKTOTAL, CKMB, CKMBINDEX, TROPONINI in the last 168 hours. BNP (last 3 results) No results for input(s): PROBNP in the last 8760 hours. HbA1C: No results for input(s): HGBA1C in the last 72 hours. CBG: No results for input(s): GLUCAP in the last 168 hours. Lipid Profile: No results for input(s): CHOL, HDL, LDLCALC, TRIG, CHOLHDL, LDLDIRECT in the last 72 hours. Thyroid Function Tests: No results for input(s): TSH, T4TOTAL, FREET4, T3FREE, THYROIDAB in the last 72  hours. Anemia Panel: Recent Labs    03/13/21 0427  FERRITIN 103  TIBC 235*  IRON 66   Sepsis Labs: No results for input(s): PROCALCITON, LATICACIDVEN in the last 168 hours.  Recent Results (from the past 240 hour(s))  Resp Panel by RT-PCR (Flu A&B, Covid) Nasopharyngeal Swab     Status: None   Collection Time: 03/12/21 10:47 AM   Specimen: Nasopharyngeal Swab; Nasopharyngeal(NP) swabs in vial transport medium  Result Value Ref Range Status   SARS Coronavirus 2 by RT PCR NEGATIVE NEGATIVE Final    Comment: (NOTE) SARS-CoV-2 target nucleic acids are NOT DETECTED.  The SARS-CoV-2 RNA is generally detectable in  upper respiratory specimens during the acute phase of infection. The lowest concentration of SARS-CoV-2 viral copies this assay can detect is 138 copies/mL. A negative result does not preclude SARS-Cov-2 infection and should not be used as the sole basis for treatment or other patient management decisions. A negative result may occur with  improper specimen collection/handling, submission of specimen other than nasopharyngeal swab, presence of viral mutation(s) within the areas targeted by this assay, and inadequate number of viral copies(<138 copies/mL). A negative result must be combined with clinical observations, patient history, and epidemiological information. The expected result is Negative.  Fact Sheet for Patients:  EntrepreneurPulse.com.au  Fact Sheet for Healthcare Providers:  IncredibleEmployment.be  This test is no t yet approved or cleared by the [place] FDA and  has been authorized for detection and/or diagnosis of SARS-CoV-2 by FDA under an Emergency Use Authorization (EUA). This EUA will remain  in effect (meaning this test can be used) for the duration of the COVID-19 declaration under Section 564(b)(1) of the Act, 21 U.S.C.section 360bbb-3(b)(1), unless the authorization is terminated  or revoked sooner.       Influenza A by PCR NEGATIVE NEGATIVE Final   Influenza B by PCR NEGATIVE NEGATIVE Final    Comment: (NOTE) The Xpert Xpress SARS-CoV-2/FLU/RSV plus assay is intended as an aid in the diagnosis of influenza from Nasopharyngeal swab specimens and should not be used as a sole basis for treatment. Nasal washings and aspirates are unacceptable for Xpert Xpress SARS-CoV-2/FLU/RSV testing.  Fact Sheet for Patients: EntrepreneurPulse.com.au  Fact Sheet for Healthcare Providers: IncredibleEmployment.be  This test is not yet approved or cleared by the [place] FDA and has been  authorized for detection and/or diagnosis of SARS-CoV-2 by FDA under an Emergency Use Authorization (EUA). This EUA will remain in effect (meaning this test can be used) for the duration of the COVID-19 declaration under Section 564(b)(1) of the Act, 21 U.S.C. section 360bbb-3(b)(1), unless the authorization is terminated or revoked.  Performed at [facility], [address], [place], [place] [address]          Radiology Studies: DG Chest 2 View  Result Date: 03/12/2021 CLINICAL DATA:  Shortness of breath. EXAM: CHEST - 2 VIEW COMPARISON:  12/03/2020. FINDINGS: Mediastinum and hilar structures normal. Cardiomegaly. Bilateral interstitial prominence consistent with interstitial edema and or pneumonitis. Left base atelectasis and consolidation. Small left pleural effusion. No pneumothorax. IMPRESSION: 1. Cardiomegaly with bilateral interstitial prominence. Interstitial prominence consistent interstitial edema and or pneumonitis. 2. Left base atelectasis and consolidation. Left base pneumonia cannot be excluded. Small left pleural effusion. Electronically Signed   By: [place]   On: 03/12/2021 08:27        Scheduled Meds:  apixaban  5 mg Oral BID   aspirin EC  81 mg Oral Daily   atorvastatin  40 mg Oral QHS   carvedilol  25 mg Oral BID   ferrous sulfate  325 mg Oral Q breakfast   hydrALAZINE  50 mg Oral TID   isosorbide mononitrate  30 mg Oral BID   multivitamin with minerals  1 tablet Oral Daily   Continuous Infusions:  furosemide (LASIX) 200 mg in dextrose 5% 100 mL ('2mg'$ /mL) infusion 6 mg/hr (03/12/21 1422)    Assessment & Plan:   Principal Problem:   Acute exacerbation of CHF (congestive heart failure) (HCC) Active Problems:   Essential hypertension   Anemia of chronic disease   Acute kidney injury superimposed on CKD (HCC)   PAF (paroxysmal atrial fibrillation) (HCC)   Proteinuria   Hyperlipidemia   # Shortness of breath-due to acute on chronic  combined heart failure and CKD stage IV Medical noncompliance Nephrology on board Continue Lasix drip Cardiology was consulted     # Acute kidney injury on CKD stage IV-secondary to cardiorenal  Nephrology following Continue Lasix drip   # Hypokalemia-we will continue supplementing with KCl as he is on Lasix drip    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-in acute exacerbation  - Echo on 09/02/2020 showed ejection fraction 30 to 35%, left ventricular global hypokinesis, grade 2 diastolic dysfunction, right ventricle systolic function is moderately decreased, left and right atrial moderately dilated 6/24 continue Imdur, carvedilol, hydralazine Cardiology consulted  #Hypertension mildly elevated but with diuresis will improve we will continue to monitor Continue cardiac meds as above    # Hyperlipidemia-continue statins # Paroxysmal atrial fibrillation-continue Eliquis # 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   Difficult to place patient No            LOS: 0 days   Time spent: 45 minutes with more than 50% on [place], MD [facility] Pager [phone]  If 7PM-7AM, please contact night-coverage 03/13/2021, 7:45 AM

## 2021-03-13 NOTE — Plan of Care (Signed)

## 2021-03-13 NOTE — Consult Note (Signed)
Cardiology Consultation:   Patient ID: Nathan [name]; [id number]; 01/15/1961   Admit date: 03/12/2021 Date of Consult: 03/13/2021  Primary Care Provider: Derinda Late, MD Primary Cardiologist: Rockey Situ Primary Electrophysiologist:  None   Patient Profile:   Nathan [name] is a 60 y.o. 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 Kurtis Bushman.  History of Present Illness:   [name]'s history of A. fib dates back to the early 2000's when he was managed with aspirin.  He had been lost to follow-up until 05/2019 when he was admitted with right hand cellulitis requiring I&D complicated by MRSA bacteremia.  Echo during that admission showed an EF of 45 to 50% with mild mitral regurgitation, and mild to moderate tricuspid regurgitation.  TEE showed no evidence of valvular vegetation with an EF of 50 to 55% by TEE.  Following diuresis he developed symptoms of volume overload with lower extremity swelling and anasarca with associated dyspnea.  He required readmission with aggressive diuresis.  CT in 06/2019 showed a left-sided empyema with bilateral lower lobe consolidation and groundglass opacities consistent with infectious process and likely reactive mediastinal and hilar adenopathy.  He required chest tube placement in the setting of left hydropneumothorax and subsequent VATS.  He was admitted in 08/2019 secondary to medication noncompliance and volume overload.  In the outpatient setting he has required multiple adjustments of his diuretic due to lower extremity swelling and  underlying CKD.  He has previously been referred to nephrology though was a no-show for his appointment.  He was admitted to [facility] on 03/12/2021 with worsening lower extremity edema and abdominal distention in the context of running out of his torsemide and carvedilol approximately 1 week prior.  He indicates he contacted his pharmacy though did not hear back from them.  No documentation of patient contacting our office.  He denies chest pain, dyspnea,, palpitations, dizziness, presyncope, or syncope.  He does state he has been sleeping in a recliner for the past 6 to 7 months secondary to orthopnea.  He reports eating a low-sodium diet and drinking less than 2 L of fluid per day.  He has not been taking his Eliquis at home as he states he should not be taking this with renal dysfunction.  Upon his arrival to [facility] he was hypertensive with BP 0000000 systolic.  O2 saturations 95% on room air.  Labs were notable for potassium of 2.9, chloride 111, bicarb 24, BUN 59, serum creatinine 3.2, initial high-sensitivity troponin 80 with a delta of 81, and Hgb 9.6.  EKG showed sinus rhythm with nonspecific ST-T changes as outlined below.  Chest x-ray showed cardiomegaly with findings consistent with interstitial edema versus pneumonitis as well as left base atelectasis and consolidation and a small left pleural effusion.  He has been consulted on by nephrology and started on a Lasix drip at 6 mg/hr.  Documented urine output approximately 700 mL.  He reports he is near his baseline prior to running out of his medications 1 week ago though does state he is not back to his overall baseline as he continues to note lower extremity swelling which has been  present for several months.   Past Medical History:  Diagnosis Date   Anemia of chronic disease    Chronic combined systolic (congestive) and diastolic (congestive) heart failure ([place])    a. 05/2019 Echo: EF 45-50%, nl LA/RA size. Mild to mod TR.   CKD (chronic kidney disease),  stage III ([place])    Diabetes mellitus without complication ([place])    Hypertension    Left Hydropneumothorax/Fibrinopurulent empyema    a. 06/2019 s/p VATS.   PAF (paroxysmal atrial fibrillation) ([place])    a. 05/2019 in setting of sepsis-->s/p TEE/DCCV; b. CHA2DS2VASc = 2-->Eliquis.   Sepsis ([place])    a. 05/2019 MRSA in setting of infected R thumb.    Past Surgical History:  Procedure Laterality Date   HAND SURGERY Right    I & D EXTREMITY Right 06/11/2019   Procedure: IRRIGATION AND DEBRIDEMENT RIGHT THUMB;  Surgeon: Dereck Leep, MD;  Location: [facility] ORS;  Service: Orthopedics;  Laterality: Right;   TEE WITHOUT CARDIOVERSION N/A 06/14/2019   Procedure: TRANSESOPHAGEAL ECHOCARDIOGRAM (TEE);  Surgeon: Minna Merritts, MD;  Location: [facility] ORS;  Service: Cardiovascular;  Laterality: N/A;   VIDEO ASSISTED THORACOSCOPY (VATS)/THOROCOTOMY Left 06/26/2019   Procedure: [id number] ASSISTED THORACOSCOPY DECORTICATION;  Surgeon: Lajuana Matte, MD;  Location: [place];  Service: Thoracic;  Laterality: Left;   VIDEO BRONCHOSCOPY N/A 06/26/2019   Procedure: VIDEO BRONCHOSCOPY;  Surgeon: Lajuana Matte, MD;  Location: [place];  Service: Thoracic;  Laterality: N/A;     Home Meds: Prior to Admission medications   Medication Sig Start Date End Date Taking? Authorizing Provider  acetaminophen (TYLENOL) 500 MG tablet Take 500-1,000 mg by mouth every 6 (six) hours as needed for mild pain or moderate pain.   Yes [provider]  aspirin EC 81 MG tablet Take 81 mg by mouth daily.   Yes [provider]  atorvastatin (LIPITOR) 40 MG tablet Take 40 mg by mouth daily.   Yes [provider]  carvedilol (COREG) 25 MG tablet Take 1 tablet by mouth twice daily Patient taking differently: Take 25 mg by mouth 2 (two) times daily. 12/31/20  Yes Minna Merritts, MD  ferrous sulfate 325 (65 FE) MG tablet Take 1 tablet (325 mg total) by mouth daily with breakfast. 12/04/20  Yes Wieting, Richard,  MD  glipiZIDE (GLUCOTROL XL) 2.5 MG 24 hr tablet Take 2.5 mg by mouth daily as needed (elevated blood glucose).   Yes [provider]  hydrALAZINE (APRESOLINE) 50 MG tablet Take 2 tablets (100 mg total) by mouth 3 (three) times daily. Patient taking differently: Take 50 mg by mouth 3 (three) times daily. 12/04/20  Yes Wieting, Richard, MD  isosorbide mononitrate (IMDUR) 30 MG 24 hr tablet Take 1 tablet (30 mg total) by mouth 2 (two) times daily. 07/21/20  Yes Minna Merritts, MD  Multiple Vitamin (MULTIVITAMIN) tablet Take 1 tablet by mouth daily.   Yes [provider]  torsemide (DEMADEX) 20 MG tablet Take 2 tablets (40 mg total) by mouth daily. 07/21/20  Yes Gollan, Kathlene November, MD  apixaban (ELIQUIS) 5 MG TABS tablet Take 1 tablet (5 mg total) by mouth 2 (two) times daily. Patient not taking: Reported on 03/12/2021 09/03/19   Rise Mu, PA-C    Inpatient Medications: Scheduled Meds:  apixaban  5 mg Oral BID   aspirin EC  81 mg Oral Daily   atorvastatin  40 mg Oral QHS   carvedilol  25 mg Oral BID   [START  ON 03/14/2021] feeding supplement (NEPRO CARB STEADY)  237 mL Oral BID BM   ferrous sulfate  325 mg Oral Q breakfast   hydrALAZINE  50 mg Oral TID   insulin aspart  0-9 Units Subcutaneous TID WC   isosorbide mononitrate  30 mg Oral BID   multivitamin with minerals  1 tablet Oral Daily   potassium chloride  40 mEq Oral BID   Continuous Infusions:  furosemide (LASIX) 200 mg in dextrose 5% 100 mL ('2mg'$ /mL) infusion 6 mg/hr (03/13/21 0900)   PRN Meds: acetaminophen **OR** acetaminophen, ondansetron **OR** ondansetron (ZOFRAN) IV  Allergies:  No Known Allergies  Social History:   Social History   Socioeconomic History   Marital status: Married    Spouse name: Not on file   Number of children: Not on file   Years of education: Not on file   Highest education level: Not on file  Occupational History   Not on file  Tobacco Use   Smoking status: Never    Smokeless tobacco: Never  Vaping Use   Vaping Use: Never used  Substance and Sexual Activity   Alcohol use: Never   Drug use: Never   Sexual activity: Not on file  Other Topics Concern   Not on file  Social History Narrative   Not on file   Social Determinants of Health   Financial Resource Strain: Not on file  Food Insecurity: Not on file  Transportation Needs: Not on file  Physical Activity: Not on file  Stress: Not on file  Social Connections: Not on file  Intimate Partner Violence: Not on file     Family History:   Family History  Problem Relation Age of Onset   CAD Father    Diabetes Sister    Diabetes Brother    Diabetes Sister    Diabetes Sister     ROS:  Review of Systems  Constitutional:  Positive for malaise/fatigue. Negative for chills, diaphoresis, fever and weight loss.  HENT:  Negative for congestion.   Eyes:  Negative for discharge and redness.  Respiratory:  Negative for cough, sputum production, shortness of breath and wheezing.   Cardiovascular:  Positive for leg swelling. Negative for chest pain, palpitations, orthopnea, claudication and PND.  Gastrointestinal:  Negative for abdominal pain, heartburn, nausea and vomiting.       Abdominal distention  Musculoskeletal:  Negative for falls and myalgias.  Skin:  Negative for rash.  Neurological:  Positive for weakness. Negative for dizziness, tingling, tremors, sensory change, speech change, focal weakness and loss of consciousness.  Endo/Heme/Allergies:  Does not bruise/bleed easily.  Psychiatric/Behavioral:  Negative for substance abuse. The patient is not nervous/anxious.   All other systems reviewed and are negative.    Physical Exam/Data:   Vitals:   03/13/21 0433 03/13/21 0730 03/13/21 1148 03/13/21 1623  BP: (!) 158/90 (!) 185/98 (!) 148/76 (!) 156/83  Pulse: 82 78 68 70  Resp: 14 16    Temp: 97.9 F (36.6 C) 97.7 F (36.5 C) 98.1 F (36.7 C) 98.1 F (36.7 C)  TempSrc: Oral Oral Oral  Oral  SpO2: 96% 98% 96% 96%  Weight:      Height:        Intake/Output Summary (Last 24 hours) at 03/13/2021 1649 Last data filed at 03/13/2021 1636 Gross per 24 hour  Intake 439.58 ml  Output 725 ml  Net -285.42 ml   Filed Weights   03/12/21 0745 03/12/21 2300  Weight: 86.2 kg 89.4 kg  Body mass index is 29.11 kg/m.   Physical Exam: General: Well developed, well nourished, in no acute distress. Head: Normocephalic, atraumatic, sclera non-icteric, no xanthomas, nares without discharge.  Neck: Negative for carotid bruits. JVD elevated to the angle of the mandible. Lungs: Bibasilar crackles. Heart: RRR with S1 S2. No murmurs, rubs, or gallops appreciated. Abdomen: Soft, non-tender, non-distended with normoactive bowel sounds. No hepatomegaly. No rebound/guarding. No obvious abdominal masses. Msk:  Strength and tone appear normal for age. Extremities: No clubbing or cyanosis.  1-2+ lower extremity pitting edema to the bilateral thighs. Distal pedal pulses are 2+ and equal bilaterally. Neuro: Alert and oriented X 3. No facial asymmetry. No focal deficit. Moves all extremities spontaneously. Psych:  Responds to questions appropriately with a normal affect.   EKG:  The EKG was personally reviewed and demonstrates: NSR, 79 bpm, right axis deviation, rare PVC, baseline wandering, nonspecific ST-T changes Telemetry:  Telemetry was personally reviewed and demonstrates: SR with rare PVC  Weights: Filed Weights   03/12/21 0745 03/12/21 2300  Weight: 86.2 kg 89.4 kg    Relevant CV Studies:  2D echo 09/02/2020: 1. Left ventricular ejection fraction, by estimation, is 30 to 35%. The  left ventricle has moderately decreased function. The left ventricle  demonstrates global hypokinesis. There is mild left ventricular  hypertrophy. Left ventricular diastolic  parameters are consistent with Grade II diastolic dysfunction  (pseudonormalization). Elevated left atrial pressure. There is  the  interventricular septum is flattened in systole and diastole, consistent  with right ventricular pressure and volume overload.   2. Right ventricular systolic function is moderately reduced. The right  ventricular size is mildly enlarged. There is severely elevated pulmonary  artery systolic pressure.   3. Left atrial size was mildly dilated.   4. Right atrial size was mildly dilated.   5. A small pericardial effusion is present. The pericardial effusion is  posterior to the left ventricle.   6. The mitral valve is normal in structure. Mild mitral valve  regurgitation. No evidence of mitral stenosis.   7. Tricuspid valve regurgitation is moderate to severe.   8. The aortic valve is tricuspid. Aortic valve regurgitation is not  visualized. No aortic stenosis is present.   9. The inferior vena cava is dilated in size with <50% respiratory  variability, suggesting right atrial pressure of 15 mmHg.   Laboratory Data:  Chemistry Recent Labs  Lab 03/12/21 0751 03/13/21 0427  NA 146* 146*  K 2.9* 3.1*  CL 111 114*  CO2 24 26  GLUCOSE 121* 140*  BUN 59* 62*  CREATININE 3.20* 3.21*  CALCIUM 8.1* 7.8*  GFRNONAA 22* 22*  ANIONGAP 11 6    No results for input(s): PROT, ALBUMIN, AST, ALT, ALKPHOS, BILITOT in the last 168 hours. Hematology Recent Labs  Lab 03/12/21 0751 03/13/21 0427  WBC 8.4 7.8  RBC 2.99* 2.94*  HGB 9.6* 9.3*  HCT 28.3* 27.5*  MCV 94.6 93.5  MCH 32.1 31.6  MCHC 33.9 33.8  RDW 13.0 13.0  PLT 385 320   Cardiac EnzymesNo results for input(s): TROPONINI in the last 168 hours. No results for input(s): TROPIPOC in the last 168 hours.  BNP Recent Labs  Lab 03/12/21 0751  BNP 2,701.7*    DDimer No results for input(s): DDIMER in the last 168 hours.  Radiology/Studies:  DG Chest 2 View  Result Date: 03/12/2021 IMPRESSION: 1. Cardiomegaly with bilateral interstitial prominence. Interstitial prominence consistent interstitial edema and or pneumonitis. 2.  Left base atelectasis and  consolidation. Left base pneumonia cannot be excluded. Small left pleural effusion. Electronically Signed   By: Marcello Moores  Register   On: 03/12/2021 08:27    Assessment and Plan:   1.  Acute on chronic combined systolic and diastolic CHF/cardiomyopathy of uncertain etiology: -Exacerbated by running out of carvedilol and torsemide approximately 1 week prior to his presentation -Patient did not contact our office, recommend he contact our office if he has medication issues moving forward -IV Lasix drip per nephrology -If augmentation of diuresis is needed could consider transfer to the ICU with initiation of milrinone -Update echo -Reports his symptoms are currently almost back to his baseline prior to running out of his medications -CHF education -Daily weights, uncertain of accuracy at this time with last clinic weight noted to be 78 kg with weight on 6/23 noted to be approximately between 86 and 89 kg -Strict I's and O's, uncertain accuracy with documented urine output of approximately 700 mL for the admission though patient reports symptom improvement near baseline  2.  Acute on CKD stage III with hypokalemia: -Management per nephrology, appreciate their assistance -Previously referred to nephrology though was a no-show  3.  PAF: -Maintaining sinus rhythm -CHA2DS2-VASc at least 4 (CHF, HTN, DM, vascular disease) -Reports he is not taking Eliquis at home secondary to renal dysfunction -Compliance with [place] is recommended, if he has concerns with [facility] he can discuss this with his primary cardiologist -Currently receiving Eliquis 5 mg twice daily as he does not meet reduced dosing criteria  4.  Coronary artery calcifications with elevated high-sensitivity troponin: -No symptoms of angina or dyspnea this admission -Minimally elevated and flat trending high-sensitivity troponin peaking at 81, not consistent with ACS -Update echo as outlined above -As his cardiomyopathy  remains of uncertain etiology, would recommend ischemic evaluation as an outpatient in follow-up once he is adequately diuresed, and pending renal function trend; as he is not currently a candidate for coronary CTA or diagnostic cardiac cath given his acute on CKD our best option would likely be a Lexiscan MPI  5.  Anemia of chronic disease: -Stable -Monitor  6.  HTN: -Improving -Diuresis as above -Carvedilol, hydralazine, isosorbide -Low-sodium diet   For questions or updates, please contact [place] Please consult [email or web address] for contact info under Cardiology/STEMI.   Signed, Christell Faith, PA-C [place] Pager: [phone] 03/13/2021, 4:49 PM

## 2021-03-13 NOTE — Progress Notes (Signed)
Mobility Specialist - Progress Note   03/13/21 1653  Mobility  Activity Ambulated in hall  Level of Assistance Standby assist, set-up cues, supervision of patient - no hands on  Assistive Device Other (Comment) (Walking Stick)  Distance Ambulated (ft) 160 ft  Mobility Ambulated with assistance in hallway  Mobility Response Tolerated well  Mobility performed by Mobility specialist  $Mobility charge 1 Mobility    Pre-mobility: 73 HR, 92% SpO2 During mobility: 77 HR, 91% SpO2 Post-mobility: 75 HR, 92% SpO2   Pt stood at bedside with supervision from elevated height. Ambulated in hallway with walking stick/IV pole and Min [place]. No LOB. Slow and careful gait. 3 short rest breaks taken to manage pain in lower back, voiced stiffness in area with noted tension. Therapeutic massage provided throughout session. Mild SOB on RA, O2 maintained > 90% during activity. PLB educated and engaged. MinA onto LE to return supine. A little fatigued post-activity. Pt left in bed with family at bedside. RN notified of performance.    Kathee Delton Mobility Specialist 03/13/21, 4:58 PM

## 2021-03-13 NOTE — Progress Notes (Addendum)
[facility]  ROUNDING NOTE   Subjective:   [name] was admitted to [facility] on 03/12/2021 for Anasarca [R60.1] Acute exacerbation of CHF (congestive heart failure) ([place]) [I50.9] Stage 4 chronic kidney disease ([place]) [N18.4]  Patient was out of his diuretics for approximately one week. He states prior to this he has been gaining weight and retaining fluid. He has kept a fluid restriction and salt restriction.   Patient feels he has improved, able to get in/out of bed independently Wife at bedside  Lasix drip '6mg'$ /hr   Objective:  Vital signs in last 24 hours:  Temp:  [97.7 F (36.5 C)-98.1 F (36.7 C)] 98.1 F (36.7 C) (06/24 1148) Pulse Rate:  [68-82] 68 (06/24 1148) Resp:  [11-21] 16 (06/24 0730) BP: (145-193)/(76-98) 148/76 (06/24 1148) SpO2:  [93 %-99 %] 96 % (06/24 1148) Weight:  [89.4 kg] 89.4 kg (06/23 2300)  Weight change:  Filed Weights   03/12/21 0745 03/12/21 2300  Weight: 86.2 kg 89.4 kg    Intake/Output: I/O last 3 completed shifts: In: -  Out: 850 [Urine:850]   Intake/Output this shift:  Total I/O In: 240 [P.O.:240] Out: -   Physical Exam: General: NAD,   Head: Normocephalic, atraumatic. Moist oral mucosal membranes  Eyes: Anicteric  Lungs:  Clear to auscultation  Heart: Regular rate and rhythm  Abdomen:  Soft, nontender, +abdominal wall edema  Extremities: +++ peripheral edema.  Neurologic: Nonfocal, moving all four extremities  Skin: No lesions       Basic Metabolic Panel: Recent Labs  Lab 03/12/21 0751 03/13/21 0427  NA 146* 146*  K 2.9* 3.1*  CL 111 114*  CO2 24 26  GLUCOSE 121* 140*  BUN 59* 62*  CREATININE 3.20* 3.21*  CALCIUM 8.1* 7.8*     Liver Function Tests: No results for input(s): AST, ALT, ALKPHOS, BILITOT, PROT, ALBUMIN in the last 168 hours. No results for input(s): LIPASE, AMYLASE in the last 168 hours. No results for input(s): AMMONIA in the last 168 hours.  CBC: Recent Labs  Lab  03/12/21 0751 03/13/21 0427  WBC 8.4 7.8  HGB 9.6* 9.3*  HCT 28.3* 27.5*  MCV 94.6 93.5  PLT 385 320     Cardiac Enzymes: No results for input(s): CKTOTAL, CKMB, CKMBINDEX, TROPONINI in the last 168 hours.  BNP: Invalid input(s): POCBNP  CBG: No results for input(s): GLUCAP in the last 168 hours.  Microbiology: Results for orders placed or performed during the hospital encounter of 03/12/21  Resp Panel by RT-PCR (Flu A&B, Covid) Nasopharyngeal Swab     Status: None   Collection Time: 03/12/21 10:47 AM   Specimen: Nasopharyngeal Swab; Nasopharyngeal(NP) swabs in vial transport medium  Result Value Ref Range Status   SARS Coronavirus 2 by RT PCR NEGATIVE NEGATIVE Final    Comment: (NOTE) SARS-CoV-2 target nucleic acids are NOT DETECTED.  The SARS-CoV-2 RNA is generally detectable in upper respiratory specimens during the acute phase of infection. The lowest concentration of SARS-CoV-2 viral copies this assay can detect is 138 copies/mL. A negative result does not preclude SARS-Cov-2 infection and should not be used as the sole basis for treatment or other patient management decisions. A negative result may occur with  improper specimen collection/handling, submission of specimen other than nasopharyngeal swab, presence of viral mutation(s) within the areas targeted by this assay, and inadequate number of viral copies(<138 copies/mL). A negative result must be combined with clinical observations, patient history, and epidemiological information. The expected result is Negative.  Fact Sheet for Patients:  EntrepreneurPulse.com.au  Fact Sheet for Healthcare Providers:  IncredibleEmployment.be  This test is no t yet approved or cleared by the [place] FDA and  has been authorized for detection and/or diagnosis of SARS-CoV-2 by FDA under an Emergency Use Authorization (EUA). This EUA will remain  in effect (meaning this test can be  used) for the duration of the COVID-19 declaration under Section 564(b)(1) of the Act, 21 U.S.C.section 360bbb-3(b)(1), unless the authorization is terminated  or revoked sooner.       Influenza A by PCR NEGATIVE NEGATIVE Final   Influenza B by PCR NEGATIVE NEGATIVE Final    Comment: (NOTE) The Xpert Xpress SARS-CoV-2/FLU/RSV plus assay is intended as an aid in the diagnosis of influenza from Nasopharyngeal swab specimens and should not be used as a sole basis for treatment. Nasal washings and aspirates are unacceptable for Xpert Xpress SARS-CoV-2/FLU/RSV testing.  Fact Sheet for Patients: EntrepreneurPulse.com.au  Fact Sheet for Healthcare Providers: IncredibleEmployment.be  This test is not yet approved or cleared by the [place] FDA and has been authorized for detection and/or diagnosis of SARS-CoV-2 by FDA under an Emergency Use Authorization (EUA). This EUA will remain in effect (meaning this test can be used) for the duration of the COVID-19 declaration under Section 564(b)(1) of the Act, 21 U.S.C. section 360bbb-3(b)(1), unless the authorization is terminated or revoked.  Performed at [facility], [address] [place], [place] [address]     Coagulation Studies: No results for input(s): LABPROT, INR in the last 72 hours.  Urinalysis: No results for input(s): COLORURINE, LABSPEC, PHURINE, GLUCOSEU, HGBUR, BILIRUBINUR, KETONESUR, PROTEINUR, UROBILINOGEN, NITRITE, LEUKOCYTESUR in the last 72 hours.  Invalid input(s): APPERANCEUR    Imaging: DG Chest 2 View  Result Date: 03/12/2021 CLINICAL DATA:  Shortness of breath. EXAM: CHEST - 2 VIEW COMPARISON:  12/03/2020. FINDINGS: Mediastinum and hilar structures normal. Cardiomegaly. Bilateral interstitial prominence consistent with interstitial edema and or pneumonitis. Left base atelectasis and consolidation. Small left pleural effusion. No pneumothorax. IMPRESSION: 1.  Cardiomegaly with bilateral interstitial prominence. Interstitial prominence consistent interstitial edema and or pneumonitis. 2. Left base atelectasis and consolidation. Left base pneumonia cannot be excluded. Small left pleural effusion. Electronically Signed   By: Marcello Moores  Register   On: 03/12/2021 08:27     Medications:    furosemide (LASIX) 200 mg in dextrose 5% 100 mL ('2mg'$ /mL) infusion 6 mg/hr (03/12/21 1422)    apixaban  5 mg Oral BID   aspirin EC  81 mg Oral Daily   atorvastatin  40 mg Oral QHS   carvedilol  25 mg Oral BID   ferrous sulfate  325 mg Oral Q breakfast   hydrALAZINE  50 mg Oral TID   isosorbide mononitrate  30 mg Oral BID   multivitamin with minerals  1 tablet Oral Daily   acetaminophen **OR** acetaminophen, ondansetron **OR** ondansetron (ZOFRAN) IV  Assessment/ Plan:  [name] is a 60 y.o. white male hypertension, hyperlipidemia, congestive heart failure, diabetes mellitus type II, atrial fibrillation, COPD who is admitted to [facility] on 03/12/2021 for Anasarca [R60.1] Acute exacerbation of CHF (congestive heart failure) ([place]) [I50.9] Stage 4 chronic kidney disease ([place]) [N18.4]  Acute kidney injury on chronic kidney disease stage IV: with proteinuria. Baseline creatinine of 2.72, GFR of 25 on 11/05/2020. Followed by [facility], Dr. Lanora Manis. Chronic kidney disease secondary to diabetes and hypertension. Acute kidney injury secondary to acute cardiorenal syndrome. Creatinine currently 3.21  Hypertension: elevated 148/76. Volume driven Home  regimen of torsemide, carvedilol, isosorbide mononitrate and hydralazine. BP improved   Acute exacerbation of chronic systolic and diastolic congestive heart failure. Continue furosemide gtt. Low salt diet and consult registered dietician. Fluid retention. Daily standing weights  Anemia with chronic kidney disease: hemoglobin 9.3, normocytic. History of iron deficiency. Iron studies unremarkable except TIBC 235    LOS:  0 Daisja Kessinger 6/24/20221:20 PM

## 2021-03-13 NOTE — Progress Notes (Signed)
Initial Nutrition Assessment  DOCUMENTATION CODES:   Non-severe (moderate) malnutrition in context of chronic illness  INTERVENTION:   Nepro Shake po BID, each supplement provides 425 kcal and 19 grams protein  MVI po daily   Low sodium diet education   NUTRITION DIAGNOSIS:   Moderate Malnutrition related to chronic illness (COPD) as evidenced by mild fat depletion, moderate fat depletion, mild muscle depletion, moderate muscle depletion.  GOAL:   Patient will meet greater than or equal to 90% of their needs  MONITOR:   PO intake, Supplement acceptance, Labs, Weight trends, Skin, I & O's  REASON FOR ASSESSMENT:   Consult Diet education  ASSESSMENT:   60 y.o. male with h/o hypertension, hyperlipidemia, CKD IV, congestive heart failure, diabetes mellitus type II, atrial fibrillation and COPD who is admitted to [facility] on 03/12/2021 for Anasarca  Met with pt and pt's wife in room today. Pt reports good appetite and oral intake in hospital and pta. Pt reports eating 25% of his breakfast this morning as he reports that he did not like the food. Pt reports that he ate 100% of his lunch. RD discussed with pt the importance of adequate nutrition needed to preserve lean muscle; pt is willing to try vanilla Nepro. Pt reports that his UBW is around 175lbs but states that he weighs around 155-160lbs when he gets "all of the fluid off". Pt is currently up ~24lbs from his UBW. RD provided pt with low sodium diet education today. Pt upset as he is being told different things from different doctors about his diet.   RD provided "Low Sodium Nutrition Therapy" handout from the Academy of Nutrition and Dietetics. Reviewed patient's dietary recall. Provided examples on ways to decrease sodium intake in diet. Discouraged intake of processed foods and use of salt shaker. Encouraged fresh fruits and vegetables as well as whole grain sources of carbohydrates to maximize fiber intake.   RD discussed why it  is important for patient to adhere to diet recommendations, and emphasized the role of fluids, foods to avoid, and importance of weighing self daily. Teach back method used.  Medications reviewed and include: aspirin, ferrous sulfate, insulin, MVI, KCL, lasix   Labs reviewed: Na 146(H), K 3.1(L), BUN 62(H), creat 3.21(H) Hgb 9.3(L), Hct 27.5(L) BG- 190 AIC 6.0(H)- 3/16  NUTRITION - FOCUSED PHYSICAL EXAM:  Flowsheet Row Most Recent Value  Orbital Region Mild depletion  Upper Arm Region Moderate depletion  Thoracic and Lumbar Region Mild depletion  Buccal Region No depletion  Temple Region No depletion  Clavicle Bone Region Mild depletion  Clavicle and Acromion Bone Region Mild depletion  Scapular Bone Region Mild depletion  Dorsal Hand Moderate depletion  Patellar Region Unable to assess  Anterior Thigh Region Unable to assess  Posterior Calf Region Unable to assess  Edema (RD Assessment) Moderate  Hair Reviewed  Eyes Reviewed  Mouth Reviewed  Skin Reviewed  Nails Reviewed      Diet Order:   Diet Order             Diet Heart Room service appropriate? Yes; Fluid consistency: Thin; Fluid restriction: 1200 mL Fluid  Diet effective now                  EDUCATION NEEDS:   Education needs have been addressed  Skin:  Skin Assessment: Reviewed RN Assessment  Last BM:  6/24  Height:   Ht Readings from Last 1 Encounters:  03/12/21 5' 9"  (1.753 m)    Weight:  Wt Readings from Last 1 Encounters:  03/12/21 89.4 kg    Ideal Body Weight:  72.7 kg  BMI:  Body mass index is 29.11 kg/m.  Estimated Nutritional Needs:   Kcal:  2000-2300kcal/day  Protein:  100-115g/day  Fluid:  1.2L per MD  Koleen Distance MS, RD, LDN Please refer to [facility] for RD and/or RD on-call/weekend/after hours pager

## 2021-03-13 NOTE — Consult Note (Signed)
   Heart Failure Nurse Navigator Note  HFrEF 30-35%.  Mild LVH.  Grade 1 diastolic dysfunction.  Right ventricular systolic function is moderately reduced.  Mild mitral regurgitation.  Moderate to severe tricuspid regurgitation.    He presented to the emergency room with shortness of breath on exertion and lower extremity.  He had ran out of his diuretic on 17 June and was not able to get it refilled.  Comorbidities:  Hypertension Hyperlipidemia Asthma atrial fibrillation Chronic kidney disease stage IV Anemia  Medications:  Eliquis 5 mg 2 times daily Carvedilol 25 mg twice a dayAspirin 81 mg daily Atorvastatin 40 mg daily Furosemide infusion at 3 mg Hydralazine 50 mg  isosorbide mononitrate 30 mg 2 times a day   Labs:  Sodium 146, potassium 3 chloride 114, CO2 26, BUN 62, creatinine 3.21, BNP 2701 Intake not documented Output 850 mL Weight 89.2 kg    Initial meeting with patient and wife today.    He states that he has not been weighing him on a daily basis.  He states that his base weight is approximately 155 pounds, and admission weight was about 196 pounds.  Stressed the importance of daily weight and recording, what to report to physician.  He states that he and his wife both do the cooking.  Wife goes on to state that they have been getting conflicting messages from physicians as of what he should and should not eat.  Discussed fluid restriction and he does not feel that he comes anywhere near taking in 64 ounces in a days time.  He states that he is compliant with medications except  when not able to get refilled.  Discussed the outpatient heart failure clinic.  Given low-sodium cookbook.  Also living with heart failure booklet and  zone magnet.  They had no further questions at this time.  Pricilla Riffle RN [facility]

## 2021-03-14 ENCOUNTER — Inpatient Hospital Stay (HOSPITAL_COMMUNITY)
Admit: 2021-03-14 | Discharge: 2021-03-14 | Disposition: A | Discharge status: Home / Self Care | Payer: Self-pay | Attending: Physician Assistant | Admitting: Physician Assistant

## 2021-03-14 DIAGNOSIS — I480 Paroxysmal atrial fibrillation: Secondary | ICD-10-CM

## 2021-03-14 DIAGNOSIS — I428 Other cardiomyopathies: Secondary | ICD-10-CM

## 2021-03-14 LAB — BASIC METABOLIC PANEL
Anion gap: 4 — ABNORMAL LOW (ref 5–15)
BUN: 64 mg/dL — ABNORMAL HIGH (ref 6–20)
CO2: 30 mmol/L (ref 22–32)
Calcium: 7.7 mg/dL — ABNORMAL LOW (ref 8.9–10.3)
Chloride: 110 mmol/L (ref 98–111)
Creatinine, Ser: 3.31 mg/dL — ABNORMAL HIGH (ref 0.61–1.24)
GFR, Estimated: 21 mL/min — ABNORMAL LOW (ref >60)
Glucose, Bld: 213 mg/dL — ABNORMAL HIGH (ref 70–99)
Potassium: 3.7 mmol/L (ref 3.5–5.1)
Sodium: 144 mmol/L (ref 135–145)

## 2021-03-14 LAB — ECHOCARDIOGRAM COMPLETE
AR max vel: 2.38 cm2
AV Peak grad: 7.5 mmHg
Ao pk vel: 1.37 m/s
Area-P 1/2: 3.93 cm2
Height: 69 in
S' Lateral: 3.9 cm
Weight: 3052.8 oz

## 2021-03-14 LAB — GLUCOSE, CAPILLARY
Glucose-Capillary: 169 mg/dL — ABNORMAL HIGH (ref 70–99)
Glucose-Capillary: 140 mg/dL — ABNORMAL HIGH (ref 70–99)
Glucose-Capillary: 150 mg/dL — ABNORMAL HIGH (ref 70–99)
Glucose-Capillary: 163 mg/dL — ABNORMAL HIGH (ref 70–99)

## 2021-03-14 MED ORDER — METOLAZONE 2.5 MG PO TABS
2.5000 mg | ORAL_TABLET | Freq: Two times a day (BID) | ORAL | Status: DC
  Administered 2021-03-14 – 2021-03-19 (×10): 2.5 mg via ORAL
  Filled 2021-03-14 (×12): qty 1

## 2021-03-14 NOTE — Progress Notes (Signed)
[facility]  PROGRESS NOTE   Subjective:   Patient seen at bedside.  Still has some shortness of breath. Complains of leg edema not improving. Patient has been on Lasix drip at 6 mg an hour. Patient has been on metolazone as outpatient.  Objective:  Vital signs in last 24 hours:  Temp:  [97.8 F (36.6 C)-98.9 F (37.2 C)] 97.9 F (36.6 C) (06/25 1146) Pulse Rate:  [62-72] 62 (06/25 1146) Resp:  [16-19] 16 (06/25 1146) BP: (137-159)/(67-84) 148/79 (06/25 1146) SpO2:  [95 %-97 %] 97 % (06/25 1146) Weight:  [86.5 kg] 86.5 kg (06/25 0156)  Weight change: 0.363 kg Filed Weights   03/12/21 0745 03/12/21 2300 03/14/21 0156  Weight: 86.2 kg 89.4 kg 86.5 kg    Intake/Output: I/O last 3 completed shifts: In: 799.6 [P.O.:770; I.V.:29.6] Out: 725 [Urine:725]   Intake/Output this shift:  Total I/O In: 360 [P.O.:360] Out: -   Physical Exam: General:  No acute distress  Head:  Normocephalic, atraumatic. Moist oral mucosal membranes  Eyes:  Anicteric  Neck:  Supple  Lungs:   Clear to auscultation, normal effort  Heart:  S1S2 no rubs  Abdomen:   Soft, nontender, bowel sounds present  Extremities: 2+ peripheral edema.  Neurologic:  Awake, alert, following commands  Skin:  No lesions  Access:     Basic Metabolic Panel: Recent Labs  Lab 03/12/21 0751 03/13/21 0427 03/14/21 0418  NA 146* 146* 144  K 2.9* 3.1* 3.7  CL 111 114* 110  CO2 '24 26 30  '$ GLUCOSE 121* 140* 213*  BUN 59* 62* 64*  CREATININE 3.20* 3.21* 3.31*  CALCIUM 8.1* 7.8* 7.7*    CBC: Recent Labs  Lab 03/12/21 0751 03/13/21 0427  WBC 8.4 7.8  HGB 9.6* 9.3*  HCT 28.3* 27.5*  MCV 94.6 93.5  PLT 385 320     Urinalysis: No results for input(s): COLORURINE, LABSPEC, PHURINE, GLUCOSEU, HGBUR, BILIRUBINUR, KETONESUR, PROTEINUR, UROBILINOGEN, NITRITE, LEUKOCYTESUR in the last 72 hours.  Invalid input(s): APPERANCEUR    Imaging: No results found.   Medications:    furosemide  (LASIX) 200 mg in dextrose 5% 100 mL ('2mg'$ /mL) infusion 6 mg/hr (03/13/21 1922)    apixaban  5 mg Oral BID   aspirin EC  81 mg Oral Daily   atorvastatin  40 mg Oral QHS   carvedilol  25 mg Oral BID   feeding supplement (NEPRO CARB STEADY)  237 mL Oral BID BM   ferrous sulfate  325 mg Oral Q breakfast   hydrALAZINE  50 mg Oral TID   insulin aspart  0-9 Units Subcutaneous TID WC   isosorbide mononitrate  30 mg Oral BID   metolazone  2.5 mg Oral BID   multivitamin with minerals  1 tablet Oral Daily   potassium chloride  40 mEq Oral BID    Assessment/ Plan:     Principal Problem:   Acute exacerbation of CHF (congestive heart failure) (HCC) Active Problems:   Essential hypertension   Anemia of chronic disease   Acute kidney injury superimposed on CKD (HCC)   PAF (paroxysmal atrial fibrillation) (HCC)   Proteinuria   Hyperlipidemia  60 year old white male with history of hypertension, coronary artery disease, congestive heart failure, hyperlipidemia, CKD stage IV with anasarca and massive proteinuria.  He is now admitted with worsening shortness of breath and leg edema.  Patient was started on furosemide drip at 6 mg an hour.  The urine output is marginal.  I would like to add  the metolazone at 2.5 mg twice a day along with the furosemide drip.  We will continue to supplement the potassium as needed.  Patient is advised on the importance of strict salt restriction and 1 L of fluid limit daily.   LOS: [place], MD [facility] 6/25/20222:30 PM

## 2021-03-14 NOTE — Progress Notes (Addendum)
Progress Note  Patient Name: Nathan [name] Date of Encounter: 03/14/2021  [facility] Cardiologist: Ida Rogue, MD   Subjective   Edema/swelling is much improved.  No acute events overnight.  Inpatient Medications    Scheduled Meds:  apixaban  5 mg Oral BID   aspirin EC  81 mg Oral Daily   atorvastatin  40 mg Oral QHS   carvedilol  25 mg Oral BID   feeding supplement (NEPRO CARB STEADY)  237 mL Oral BID BM   ferrous sulfate  325 mg Oral Q breakfast   hydrALAZINE  50 mg Oral TID   insulin aspart  0-9 Units Subcutaneous TID WC   isosorbide mononitrate  30 mg Oral BID   multivitamin with minerals  1 tablet Oral Daily   potassium chloride  40 mEq Oral BID   Continuous Infusions:  furosemide (LASIX) 200 mg in dextrose 5% 100 mL ('2mg'$ /mL) infusion 6 mg/hr (03/13/21 1922)   PRN Meds: acetaminophen **OR** acetaminophen, ondansetron **OR** ondansetron (ZOFRAN) IV   Vital Signs    Vitals:   03/14/21 0156 03/14/21 0437 03/14/21 0819 03/14/21 1146  BP:  (!) 159/84 (!) 145/78 (!) 148/79  Pulse:  66 63 62  Resp:  '19 16 16  '$ Temp:  98.4 F (36.9 C) 97.8 F (36.6 C) 97.9 F (36.6 C)  TempSrc:  Oral    SpO2:  97% 96% 97%  Weight: 86.5 kg     Height:        Intake/Output Summary (Last 24 hours) at 03/14/2021 1255 Last data filed at 03/14/2021 1001 Gross per 24 hour  Intake 792.8 ml  Output 275 ml  Net 517.8 ml   Last 3 Weights 03/14/2021 03/12/2021 03/12/2021  Weight (lbs) 190 lb 12.8 oz 197 lb 1.5 oz 190 lb  Weight (kg) 86.546 kg 89.4 kg 86.183 kg      Telemetry    Sinus rhythm- Personally Reviewed  ECG     - Personally Reviewed  Physical Exam   GEN: No acute distress.   Neck: No JVD Cardiac: RRR,  Respiratory: Diminished breath sounds at bases GI: Soft, nontender, non-distended  MS: 2+ edema; No deformity. Neuro:  Nonfocal  Psych: Normal affect   Labs    High Sensitivity Troponin:   Recent Labs  Lab 03/12/21 0751 03/12/21 1124   TROPONINIHS 80* 81*      Chemistry Recent Labs  Lab 03/12/21 0751 03/13/21 0427 03/14/21 0418  NA 146* 146* 144  K 2.9* 3.1* 3.7  CL 111 114* 110  CO2 '24 26 30  '$ GLUCOSE 121* 140* 213*  BUN 59* 62* 64*  CREATININE 3.20* 3.21* 3.31*  CALCIUM 8.1* 7.8* 7.7*  GFRNONAA 22* 22* 21*  ANIONGAP 11 6 4*     Hematology Recent Labs  Lab 03/12/21 0751 03/13/21 0427  WBC 8.4 7.8  RBC 2.99* 2.94*  HGB 9.6* 9.3*  HCT 28.3* 27.5*  MCV 94.6 93.5  MCH 32.1 31.6  MCHC 33.9 33.8  RDW 13.0 13.0  PLT 385 320    BNP Recent Labs  Lab 03/12/21 0751  BNP 2,701.7*     DDimer No results for input(s): DDIMER in the last 168 hours.   Radiology    No results found.  Cardiac Studies   Echo 08/2020, EF 30 to 35%. Repeat echocardiogram today pending   Patient Profile     61 y.o. male with history of parox atrial fibrillation, HFrEF EF 30-35%, CKD presenting with edema and volume overload due to running  out of diuretics.  Assessment & Plan    HFrEF EF 35%, volume overload -Edema improved with Lasix drip -Continue Lasix drip as prescribed, consider adding metolazone pending kidney function. -Coreg, hydralazine, Imdur. -Avoid nephrotoxic's.  2. Paroxysmal atrial fibrillation -Maintaining sinus rhythm -Continue Coreg, Eliquis  Total encounter time 35 minutes  Greater than 50% was spent in counseling and coordination of care with the patient     Signed, Kate Sable, MD  03/14/2021, 12:55 PM

## 2021-03-14 NOTE — Progress Notes (Signed)
PROGRESS NOTE    Nathan [name]  [id number] DOB: 05-Jul-1961 DOA: 03/12/2021 PCP: Derinda Late, MD    Brief Narrative:  Nathan [name] [place] is a 60 y.o. male with medical history significant for hypertension, hyperlipidemia, paf on eliquis, CKD4, combined heart failure, medication noncompliance presents to the emergency department for chief concern of shortness of breath.  6/24 patient endorsed 20 pound weight gain within the last week to me this AM.  Patient is wearing pants and socks and I am unable to evaluate his legs well, when I asked him if he would be willing to change into hospital gown he refused.  I do notice he has edema up to his thighs.  6/25-complaining the lasix gtt is going to slow and if we can make it go faster so he can go home sooner. Explained pharmacies protocal on adminis. of gtt and to expect couple of more days of hospitalization to stablize him  as he is still volume overloaded.   Consultants:  Cardiology, nephrology  Procedures:   Antimicrobials:      Subjective: Sob improving but not at baseline still. No cp  Objective: Vitals:   03/13/21 1623 03/13/21 1938 03/14/21 0156 03/14/21 0437  BP: (!) 156/83 137/67  (!) 159/84  Pulse: 70 72  66  Resp:  18  19  Temp: 98.1 F (36.7 C) 98.9 F (37.2 C)  98.4 F (36.9 C)  TempSrc: Oral Oral  Oral  SpO2: 96% 95%  97%  Weight:   86.5 kg   Height:        Intake/Output Summary (Last 24 hours) at 03/14/2021 0818 Last data filed at 03/13/2021 1938 Gross per 24 hour  Intake 799.58 ml  Output 275 ml  Net 524.58 ml   Filed Weights   03/12/21 0745 03/12/21 2300 03/14/21 0156  Weight: 86.2 kg 89.4 kg 86.5 kg    Examination: Nad, calm Decrease bs at bases, crackles scattered Regular s1/s2 no gallop Soft benign +bs  +edema up to mid thigh Aaxox4 Mood and affect appropriate in current setting    Data Reviewed: I have personally reviewed following labs and imaging studies  CBC: Recent Labs   Lab 03/12/21 0751 03/13/21 0427  WBC 8.4 7.8  HGB 9.6* 9.3*  HCT 28.3* 27.5*  MCV 94.6 93.5  PLT 385 [id number]   Basic Metabolic Panel: Recent Labs  Lab 03/12/21 0751 03/13/21 0427 03/14/21 0418  NA 146* 146* 144  K 2.9* 3.1* 3.7  CL 111 114* 110  CO2 '24 26 30  '$ GLUCOSE 121* 140* 213*  BUN 59* 62* 64*  CREATININE 3.20* 3.21* 3.31*  CALCIUM 8.1* 7.8* 7.7*   GFR: Estimated Creatinine Clearance: 26.5 mL/min (A) (by C-G formula based on SCr of 3.31 mg/dL (H)). Liver Function Tests: No results for input(s): AST, ALT, ALKPHOS, BILITOT, PROT, ALBUMIN in the last 168 hours. No results for input(s): LIPASE, AMYLASE in the last 168 hours. No results for input(s): AMMONIA in the last 168 hours. Coagulation Profile: No results for input(s): INR, PROTIME in the last 168 hours. Cardiac Enzymes: No results for input(s): CKTOTAL, CKMB, CKMBINDEX, TROPONINI in the last 168 hours. BNP (last 3 results) No results for input(s): PROBNP in the last 8760 hours. HbA1C: No results for input(s): HGBA1C in the last 72 hours. CBG: Recent Labs  Lab 03/13/21 1627 03/13/21 2143  GLUCAP 190* 256*   Lipid Profile: No results for input(s): CHOL, HDL, LDLCALC, TRIG, CHOLHDL, LDLDIRECT in the last 72 hours. Thyroid Function Tests: No  results for input(s): TSH, T4TOTAL, FREET4, T3FREE, THYROIDAB in the last 72 hours. Anemia Panel: Recent Labs    03/13/21 0427  FERRITIN 103  TIBC 235*  IRON 66   Sepsis Labs: No results for input(s): PROCALCITON, LATICACIDVEN in the last 168 hours.  Recent Results (from the past 240 hour(s))  Resp Panel by RT-PCR (Flu A&B, Covid) Nasopharyngeal Swab     Status: None   Collection Time: 03/12/21 10:47 AM   Specimen: Nasopharyngeal Swab; Nasopharyngeal(NP) swabs in vial transport medium  Result Value Ref Range Status   SARS Coronavirus 2 by RT PCR NEGATIVE NEGATIVE Final    Comment: (NOTE) SARS-CoV-2 target nucleic acids are NOT DETECTED.  The SARS-CoV-2 RNA is  generally detectable in upper respiratory specimens during the acute phase of infection. The lowest concentration of SARS-CoV-2 viral copies this assay can detect is 138 copies/mL. A negative result does not preclude SARS-Cov-2 infection and should not be used as the sole basis for treatment or other patient management decisions. A negative result may occur with  improper specimen collection/handling, submission of specimen other than nasopharyngeal swab, presence of viral mutation(s) within the areas targeted by this assay, and inadequate number of viral copies(<138 copies/mL). A negative result must be combined with clinical observations, patient history, and epidemiological information. The expected result is Negative.  Fact Sheet for Patients:  EntrepreneurPulse.com.au  Fact Sheet for Healthcare Providers:  IncredibleEmployment.be  This test is no t yet approved or cleared by the [place] FDA and  has been authorized for detection and/or diagnosis of SARS-CoV-2 by FDA under an Emergency Use Authorization (EUA). This EUA will remain  in effect (meaning this test can be used) for the duration of the COVID-19 declaration under Section 564(b)(1) of the Act, 21 U.S.C.section 360bbb-3(b)(1), unless the authorization is terminated  or revoked sooner.       Influenza A by PCR NEGATIVE NEGATIVE Final   Influenza B by PCR NEGATIVE NEGATIVE Final    Comment: (NOTE) The Xpert Xpress SARS-CoV-2/FLU/RSV plus assay is intended as an aid in the diagnosis of influenza from Nasopharyngeal swab specimens and should not be used as a sole basis for treatment. Nasal washings and aspirates are unacceptable for Xpert Xpress SARS-CoV-2/FLU/RSV testing.  Fact Sheet for Patients: EntrepreneurPulse.com.au  Fact Sheet for Healthcare Providers: IncredibleEmployment.be  This test is not yet approved or cleared by the [place] FDA and has been authorized for detection and/or diagnosis of SARS-CoV-2 by FDA under an Emergency Use Authorization (EUA). This EUA will remain in effect (meaning this test can be used) for the duration of the COVID-19 declaration under Section 564(b)(1) of the Act, 21 U.S.C. section 360bbb-3(b)(1), unless the authorization is terminated or revoked.  Performed at [facility], [address], [place], [place] [address]          Radiology Studies: No results found.      Scheduled Meds:  apixaban  5 mg Oral BID   aspirin EC  81 mg Oral Daily   atorvastatin  40 mg Oral QHS   carvedilol  25 mg Oral BID   feeding supplement (NEPRO CARB STEADY)  237 mL Oral BID BM   ferrous sulfate  325 mg Oral Q breakfast   hydrALAZINE  50 mg Oral TID   insulin aspart  0-9 Units Subcutaneous TID WC   isosorbide mononitrate  30 mg Oral BID   multivitamin with minerals  1 tablet Oral Daily   potassium chloride  40 mEq Oral BID  Continuous Infusions:  furosemide (LASIX) 200 mg in dextrose 5% 100 mL ('2mg'$ /mL) infusion 6 mg/hr (03/13/21 1922)    Assessment & Plan:   Principal Problem:   Acute exacerbation of CHF (congestive heart failure) (HCC) Active Problems:   Essential hypertension   Anemia of chronic disease   Acute kidney injury superimposed on CKD (HCC)   PAF (paroxysmal atrial fibrillation) (HCC)   Proteinuria   Hyperlipidemia   # Shortness of breath-due to acute on chronic combined heart failure and CKD stage IV Medical noncompliance 6/25-BNP very elevated on admission  lcinically slowly improving however still symptomatic. 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  Consider metolazone will discuss with nephrology  Cardiology following    #Hypertension -improving with diuresis  Continue current management      # Hyperlipidemia-continue statins # Paroxysmal atrial fibrillation-continue Eliquis # Hyperparathyroid secondary to chronic kidney disease # Anemia of chronic kidney disease-at baseline      DVT prophylaxis: Eliquis Code Status: Full Family Communication: None at bedside Disposition Plan:  Status is: Inpatient  Patient is inpatient as he requires hospitalization due to the severity of his illness and requiring IV medications  Dispo: The patient is from: Home              Anticipated d/c is to: Home              Patient currently is not medically stable to d/c.   Difficult to place patient No            LOS: 1 day   Time spent: 35 minutes with more than 50% on [place], MD [facility] Pager [phone]  If 7PM-7AM, please contact night-coverage 03/14/2021, 8:18 AM

## 2021-03-15 LAB — GLUCOSE, CAPILLARY
Glucose-Capillary: 108 mg/dL — ABNORMAL HIGH (ref 70–99)
Glucose-Capillary: 99 mg/dL (ref 70–99)
Glucose-Capillary: 107 mg/dL — ABNORMAL HIGH (ref 70–99)
Glucose-Capillary: 139 mg/dL — ABNORMAL HIGH (ref 70–99)

## 2021-03-15 LAB — MAGNESIUM: Magnesium: 2.3 mg/dL (ref 1.7–2.4)

## 2021-03-15 LAB — BASIC METABOLIC PANEL
Anion gap: 5 (ref 5–15)
BUN: 74 mg/dL — ABNORMAL HIGH (ref 6–20)
CO2: 27 mmol/L (ref 22–32)
Calcium: 7.6 mg/dL — ABNORMAL LOW (ref 8.9–10.3)
Chloride: 111 mmol/L (ref 98–111)
Creatinine, Ser: 3.62 mg/dL — ABNORMAL HIGH (ref 0.61–1.24)
GFR, Estimated: 19 mL/min — ABNORMAL LOW (ref >60)
Glucose, Bld: 121 mg/dL — ABNORMAL HIGH (ref 70–99)
Potassium: 4.5 mmol/L (ref 3.5–5.1)
Sodium: 143 mmol/L (ref 135–145)

## 2021-03-15 MED ORDER — FUROSEMIDE 10 MG/ML IJ SOLN
40.0000 mg | Freq: Three times a day (TID) | INTRAMUSCULAR | Status: DC
  Administered 2021-03-15 – 2021-03-17 (×6): 40 mg via INTRAVENOUS
  Filled 2021-03-15 (×6): qty 4

## 2021-03-15 NOTE — Progress Notes (Addendum)
PROGRESS NOTE    Nathan [name]  [id number] DOB: 01/12/1961 DOA: 03/12/2021 PCP: Derinda Late, MD    Brief Narrative:  Nathan [name] is a 60 y.o. male with medical history significant for hypertension, hyperlipidemia, paf on eliquis, CKD4, combined heart failure, medication noncompliance presents to the emergency department for chief concern of shortness of breath.  6/24 patient endorsed 20 pound weight gain within the last week to me this AM.  Patient is wearing pants and socks and I am unable to evaluate his legs well, when I asked him if he would be willing to change into hospital gown he refused.  I do notice he has edema up to his thighs.  6/25-complaining the lasix gtt is going to slow and if we can make it go faster so he can go home sooner. Explained pharmacies protocal on adminis. of gtt and to expect couple of more days of hospitalization to stablize him  as he is still volume overloaded.   6/26-had 13 beats of VT on telemetry.  However patient does not believe it.  He thinks the monitor is wrong.  Urine output improved after metolazone.  Patient also had foul-smelling diarrhea this AM.  Consultants:  Cardiology, nephrology  Procedures:   Antimicrobials:      Subjective: Sob improving, LE edema improving...> but not at baseline yet. No cp  Objective: Vitals:   03/14/21 1935 03/15/21 0357 03/15/21 0500 03/15/21 0745  BP: 137/71 (!) 153/72  (!) 163/82  Pulse: 65 67  63  Resp: '14 14  17  '$ Temp: 98.6 F (37 C) (!) 97.5 F (36.4 C)  98 F (36.7 C)  TempSrc: Oral     SpO2: 96% 96%  98%  Weight:   85 kg   Height:        Intake/Output Summary (Last 24 hours) at 03/15/2021 [id number] Last data filed at 03/15/2021 0700 Gross per 24 hour  Intake 360 ml  Output 900 ml  Net -540 ml   Filed Weights   03/12/21 2300 03/14/21 0156 03/15/21 0500  Weight: 89.4 kg 86.5 kg 85 kg    Examination: Calm, NAD Decreased breath sounds at bases no wheezing Regular S1-S2  no gallops Soft benign positive bowel sounds 2-3+ positive pitting edema bilaterally Alert oriented x4.  Grossly intact   Data Reviewed: I have personally reviewed following labs and imaging studies  CBC: Recent Labs  Lab 03/12/21 0751 03/13/21 0427  WBC 8.4 7.8  HGB 9.6* 9.3*  HCT 28.3* 27.5*  MCV 94.6 93.5  PLT 385 [id number]   Basic Metabolic Panel: Recent Labs  Lab 03/12/21 0751 03/13/21 0427 03/14/21 0418 03/15/21 0348  NA 146* 146* 144 143  K 2.9* 3.1* 3.7 4.5  CL 111 114* 110 111  CO2 '24 26 30 27  '$ GLUCOSE 121* 140* 213* 121*  BUN 59* 62* 64* 74*  CREATININE 3.20* 3.21* 3.31* 3.62*  CALCIUM 8.1* 7.8* 7.7* 7.6*   GFR: Estimated Creatinine Clearance: 24 mL/min (A) (by C-G formula based on SCr of 3.62 mg/dL (H)). Liver Function Tests: No results for input(s): AST, ALT, ALKPHOS, BILITOT, PROT, ALBUMIN in the last 168 hours. No results for input(s): LIPASE, AMYLASE in the last 168 hours. No results for input(s): AMMONIA in the last 168 hours. Coagulation Profile: No results for input(s): INR, PROTIME in the last 168 hours. Cardiac Enzymes: No results for input(s): CKTOTAL, CKMB, CKMBINDEX, TROPONINI in the last 168 hours. BNP (last 3 results) No results for input(s): PROBNP in the last  8760 hours. HbA1C: No results for input(s): HGBA1C in the last 72 hours. CBG: Recent Labs  Lab 03/14/21 0820 03/14/21 1147 03/14/21 1700 03/14/21 1936 03/15/21 0747  GLUCAP 169* 140* 150* 163* 108*   Lipid Profile: No results for input(s): CHOL, HDL, LDLCALC, TRIG, CHOLHDL, LDLDIRECT in the last 72 hours. Thyroid Function Tests: No results for input(s): TSH, T4TOTAL, FREET4, T3FREE, THYROIDAB in the last 72 hours. Anemia Panel: Recent Labs    03/13/21 0427  FERRITIN 103  TIBC 235*  IRON 66   Sepsis Labs: No results for input(s): PROCALCITON, LATICACIDVEN in the last 168 hours.  Recent Results (from the past 240 hour(s))  Resp Panel by RT-PCR (Flu A&B, Covid)  Nasopharyngeal Swab     Status: None   Collection Time: 03/12/21 10:47 AM   Specimen: Nasopharyngeal Swab; Nasopharyngeal(NP) swabs in vial transport medium  Result Value Ref Range Status   SARS Coronavirus 2 by RT PCR NEGATIVE NEGATIVE Final    Comment: (NOTE) SARS-CoV-2 target nucleic acids are NOT DETECTED.  The SARS-CoV-2 RNA is generally detectable in upper respiratory specimens during the acute phase of infection. The lowest concentration of SARS-CoV-2 viral copies this assay can detect is 138 copies/mL. A negative result does not preclude SARS-Cov-2 infection and should not be used as the sole basis for treatment or other patient management decisions. A negative result may occur with  improper specimen collection/handling, submission of specimen other than nasopharyngeal swab, presence of viral mutation(s) within the areas targeted by this assay, and inadequate number of viral copies(<138 copies/mL). A negative result must be combined with clinical observations, patient history, and epidemiological information. The expected result is Negative.  Fact Sheet for Patients:  EntrepreneurPulse.com.au  Fact Sheet for Healthcare Providers:  IncredibleEmployment.be  This test is no t yet approved or cleared by the [place] FDA and  has been authorized for detection and/or diagnosis of SARS-CoV-2 by FDA under an Emergency Use Authorization (EUA). This EUA will remain  in effect (meaning this test can be used) for the duration of the COVID-19 declaration under Section 564(b)(1) of the Act, 21 U.S.C.section 360bbb-3(b)(1), unless the authorization is terminated  or revoked sooner.       Influenza A by PCR NEGATIVE NEGATIVE Final   Influenza B by PCR NEGATIVE NEGATIVE Final    Comment: (NOTE) The Xpert Xpress SARS-CoV-2/FLU/RSV plus assay is intended as an aid in the diagnosis of influenza from Nasopharyngeal swab specimens and should not be  used as a sole basis for treatment. Nasal washings and aspirates are unacceptable for Xpert Xpress SARS-CoV-2/FLU/RSV testing.  Fact Sheet for Patients: EntrepreneurPulse.com.au  Fact Sheet for Healthcare Providers: IncredibleEmployment.be  This test is not yet approved or cleared by the [place] FDA and has been authorized for detection and/or diagnosis of SARS-CoV-2 by FDA under an Emergency Use Authorization (EUA). This EUA will remain in effect (meaning this test can be used) for the duration of the COVID-19 declaration under Section 564(b)(1) of the Act, 21 U.S.C. section 360bbb-3(b)(1), unless the authorization is terminated or revoked.  Performed at [facility], [address], [place], [place] [address]          Radiology Studies: ECHOCARDIOGRAM COMPLETE  Result Date: 03/14/2021    ECHOCARDIOGRAM REPORT   Patient Name:   Nathan [name] [facility] Date of Exam: 03/14/2021 Medical Rec #:  [id number]          Height:       69.0 in Accession #:    [id number]  Weight:       190.8 lb Date of Birth:  11-27-1960          BSA:          2.025 m Patient Age:    54 years           BP:           145/76 mmHg Patient Gender: M                  HR:           68 bpm. Exam Location:  [facility] Procedure: 2D Echo, Color Doppler and Cardiac Doppler Indications:     Cardiomyopathy-Unspecified I42.9  History:         Patient has prior history of Echocardiogram examinations. CHF,                  Arrythmias:Atrial Fibrillation; Risk Factors:Diabetes.  Sonographer:     Alyse Low Roar Referring Phys:  [id number] Rise Mu Diagnosing Phys: Kate Sable MD IMPRESSIONS  1. Left ventricular ejection fraction, by estimation, is 40 to 45%. The left ventricle has mildly decreased function. The left ventricle demonstrates global hypokinesis. There is mild left ventricular hypertrophy. Left ventricular diastolic parameters are consistent with Grade II diastolic  dysfunction (pseudonormalization).  2. Right ventricular systolic function is low normal. The right ventricular size is normal.  3. Left atrial size was mildly dilated.  4. A small pericardial effusion is present.  5. The mitral valve is normal in structure. Trivial mitral valve regurgitation.  6. The aortic valve is tricuspid. Aortic valve regurgitation is not visualized.  7. The inferior vena cava is dilated in size with <50% respiratory variability, suggesting right atrial pressure of 15 mmHg. FINDINGS  Left Ventricle: Left ventricular ejection fraction, by estimation, is 40 to 45%. The left ventricle has mildly decreased function. The left ventricle demonstrates global hypokinesis. The left ventricular internal cavity size was normal in size. There is  mild left ventricular hypertrophy. Left ventricular diastolic parameters are consistent with Grade II diastolic dysfunction (pseudonormalization). Right Ventricle: The right ventricular size is normal. No increase in right ventricular wall thickness. Right ventricular systolic function is low normal. Left Atrium: Left atrial size was mildly dilated. Right Atrium: Right atrial size was normal in size. Pericardium: A small pericardial effusion is present. Mitral Valve: The mitral valve is normal in structure. Trivial mitral valve regurgitation. Tricuspid Valve: The tricuspid valve is normal in structure. Tricuspid valve regurgitation is mild. Aortic Valve: The aortic valve is tricuspid. Aortic valve regurgitation is not visualized. Aortic valve peak gradient measures 7.5 mmHg. Pulmonic Valve: The pulmonic valve was normal in structure. Pulmonic valve regurgitation is trivial. Aorta: The aortic root is normal in size and structure. Venous: The inferior vena cava is dilated in size with less than 50% respiratory variability, suggesting right atrial pressure of 15 mmHg. IAS/Shunts: No atrial level shunt detected by color flow Doppler.  LEFT VENTRICLE PLAX 2D LVIDd:          5.10 cm  Diastology LVIDs:         3.90 cm  LV e' medial:    4.35 cm/s LV PW:         1.10 cm  LV E/e' medial:  24.8 LV IVS:        1.30 cm  LV e' lateral:   7.51 cm/s LVOT diam:     2.10 cm  LV E/e' lateral: 14.4 LVOT Area:     3.46  cm  RIGHT VENTRICLE RV Mid diam:    3.70 cm RV S prime:     10.10 cm/s TAPSE (M-mode): 1.5 cm LEFT ATRIUM           Index       RIGHT ATRIUM           Index LA diam:      4.10 cm 2.02 cm/m  RA Area:     19.00 cm LA Vol (A4C): 74.8 ml 36.94 ml/m RA Volume:   53.20 ml  26.27 ml/m  AORTIC VALVE                PULMONIC VALVE AV Area (Vmax): 2.38 cm    PV Vmax:        1.03 m/s AV Vmax:        137.00 cm/s PV Peak grad:   4.2 mmHg AV Peak Grad:   7.5 mmHg    RVOT Peak grad: 1 mmHg LVOT Vmax:      94.10 cm/s  AORTA Ao Root diam: 2.50 cm MITRAL VALVE                TRICUSPID VALVE MV Area (PHT): 3.93 cm     TR Peak grad:   42.8 mmHg MV Decel Time: 193 msec     TR Vmax:        327.00 cm/s MV E velocity: 108.00 cm/s MV A velocity: 56.30 cm/s   SHUNTS MV E/A ratio:  1.92         Systemic Diam: 2.10 cm MV A Prime:    4.5 cm/s Kate Sable MD Electronically signed by Kate Sable MD Signature Date/Time: 03/14/2021/5:49:16 PM    Final         Scheduled Meds:  apixaban  5 mg Oral BID   aspirin EC  81 mg Oral Daily   atorvastatin  40 mg Oral QHS   carvedilol  25 mg Oral BID   feeding supplement (NEPRO CARB STEADY)  237 mL Oral BID BM   ferrous sulfate  325 mg Oral Q breakfast   hydrALAZINE  50 mg Oral TID   insulin aspart  0-9 Units Subcutaneous TID WC   isosorbide mononitrate  30 mg Oral BID   metolazone  2.5 mg Oral BID   multivitamin with minerals  1 tablet Oral Daily   potassium chloride  40 mEq Oral BID   Continuous Infusions:  furosemide (LASIX) 200 mg in dextrose 5% 100 mL ('2mg'$ /mL) infusion 6 mg/hr (03/13/21 1922)    Assessment & Plan:   Principal Problem:   Acute exacerbation of CHF (congestive heart failure) (HCC) Active Problems:   Essential  hypertension   Anemia of chronic disease   Acute kidney injury superimposed on CKD (HCC)   PAF (paroxysmal atrial fibrillation) (HCC)   Proteinuria   Hyperlipidemia   # Shortness of breath-due to acute on chronic combined heart failure and CKD stage IV Medical noncompliance BNP very elevated on admission  6/26 clinically slowly improving Nephrology cardiology on board On Lasix drip and better output with addition of metolazone Nephrology will switch Lasix drip to Lasix 40 mg every 8 hours Monitor lites Strict salt restriction and 1 L fluid limitation daily     # Acute kidney injury on CKD stage IV-secondary to cardiorenal  Nephrology following 6/26 creatinine 3.62 slightly worse. Nephrology changing Lasix drip to IV Lasix as above Continue to monitor Avoid nephrotoxic meds   # Hypokalemia-supplement as required Monitor levels   # Combined  heart failure reduced and preserved ejection fraction-in acute exacerbation  - Echo on 09/02/2020 showed ejection fraction 30 to 35%, left ventricular global hypokinesis, grade 2 diastolic dysfunction, right ventricle systolic function is moderately decreased, left and right atrial moderately dilated 6/26 we will switch Lasix drip to IV Lasix as above Continue Coreg, hydralazine, Imdur   #13 beat run of VT Cardiology following Mag and potassium normal Continue beta-blockers  #Hypertension -improving with diuresis  Continue current management    #diarrhea-foul smelling Pt also reports had diarrhea at home.  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   Difficult to place patient No            LOS: 2 days   Time spent: 35 minutes with more than 50% on [place], MD [facility] Pager [phone]  If 7PM-7AM, please contact night-coverage 03/15/2021, 8:22 AM

## 2021-03-15 NOTE — Progress Notes (Signed)
Pt is A&O, VS stable, NSR on the monitor. IV in place. Getting IV lasix at '6mg'$ /hr. Urine o/p ok. Using urinal at bedside. No complaints of pain or discomfort.

## 2021-03-15 NOTE — Progress Notes (Signed)
Progress Note  Patient Name: Nathan [name] Date of Encounter: 03/15/2021  [facility] Cardiologist: Ida Rogue, MD   Subjective   Denies shortness of breath, urinating okay, edema is improving.  Inpatient Medications    Scheduled Meds:  apixaban  5 mg Oral BID   aspirin EC  81 mg Oral Daily   atorvastatin  40 mg Oral QHS   carvedilol  25 mg Oral BID   feeding supplement (NEPRO CARB STEADY)  237 mL Oral BID BM   ferrous sulfate  325 mg Oral Q breakfast   hydrALAZINE  50 mg Oral TID   insulin aspart  0-9 Units Subcutaneous TID WC   isosorbide mononitrate  30 mg Oral BID   metolazone  2.5 mg Oral BID   multivitamin with minerals  1 tablet Oral Daily   potassium chloride  40 mEq Oral BID   Continuous Infusions:  furosemide (LASIX) 200 mg in dextrose 5% 100 mL ('2mg'$ /mL) infusion 6 mg/hr (03/13/21 1922)   PRN Meds: ondansetron **OR** ondansetron (ZOFRAN) IV   Vital Signs    Vitals:   03/14/21 1935 03/15/21 0357 03/15/21 0500 03/15/21 0745  BP: 137/71 (!) 153/72  (!) 163/82  Pulse: 65 67  63  Resp: '14 14  17  '$ Temp: 98.6 F (37 C) (!) 97.5 F (36.4 C)  98 F (36.7 C)  TempSrc: Oral     SpO2: 96% 96%  98%  Weight:   85 kg   Height:        Intake/Output Summary (Last 24 hours) at 03/15/2021 1114 Last data filed at 03/15/2021 0841 Gross per 24 hour  Intake 120 ml  Output 1180 ml  Net -1060 ml   Last 3 Weights 03/15/2021 03/14/2021 03/12/2021  Weight (lbs) 187 lb 6.3 oz 190 lb 12.8 oz 197 lb 1.5 oz  Weight (kg) 85 kg 86.546 kg 89.4 kg      Telemetry    Sinus rhythm, 13 beat run of VT- Personally Reviewed  ECG     - Personally Reviewed  Physical Exam   GEN: No acute distress.   Neck: No JVD Cardiac: RRR,  Respiratory: Diminished breath sounds at bases GI: Soft, nontender, non-distended  MS: 2+ edema; No deformity. Neuro:  Nonfocal  Psych: Normal affect   Labs    High Sensitivity Troponin:   Recent Labs  Lab 03/12/21 0751 03/12/21 1124   TROPONINIHS 80* 81*      Chemistry Recent Labs  Lab 03/13/21 0427 03/14/21 0418 03/15/21 0348  NA 146* 144 143  K 3.1* 3.7 4.5  CL 114* 110 111  CO2 '26 30 27  '$ GLUCOSE 140* 213* 121*  BUN 62* 64* 74*  CREATININE 3.21* 3.31* 3.62*  CALCIUM 7.8* 7.7* 7.6*  GFRNONAA 22* 21* 19*  ANIONGAP 6 4* 5     Hematology Recent Labs  Lab 03/12/21 0751 03/13/21 0427  WBC 8.4 7.8  RBC 2.99* 2.94*  HGB 9.6* 9.3*  HCT 28.3* 27.5*  MCV 94.6 93.5  MCH 32.1 31.6  MCHC 33.9 33.8  RDW 13.0 13.0  PLT 385 320    BNP Recent Labs  Lab 03/12/21 0751  BNP 2,701.7*     DDimer No results for input(s): DDIMER in the last 168 hours.   Radiology    ECHOCARDIOGRAM COMPLETE  Result Date: 03/14/2021    ECHOCARDIOGRAM REPORT   Patient Name:   Nathan [name] [facility] Date of Exam: 03/14/2021 Medical Rec #:  [id number]  Height:       69.0 in Accession #:    [id number]         Weight:       190.8 lb Date of Birth:  1960/12/27          BSA:          2.025 m Patient Age:    60 years           BP:           145/76 mmHg Patient Gender: M                  HR:           68 bpm. Exam Location:  [facility] Procedure: 2D Echo, Color Doppler and Cardiac Doppler Indications:     Cardiomyopathy-Unspecified I42.9  History:         Patient has prior history of Echocardiogram examinations. CHF,                  Arrythmias:Atrial Fibrillation; Risk Factors:Diabetes.  Sonographer:     Alyse Low Roar Referring Phys:  [id number] Rise Mu Diagnosing Phys: Kate Sable MD IMPRESSIONS  1. Left ventricular ejection fraction, by estimation, is 40 to 45%. The left ventricle has mildly decreased function. The left ventricle demonstrates global hypokinesis. There is mild left ventricular hypertrophy. Left ventricular diastolic parameters are consistent with Grade II diastolic dysfunction (pseudonormalization).  2. Right ventricular systolic function is low normal. The right ventricular size is normal.  3. Left atrial size was mildly  dilated.  4. A small pericardial effusion is present.  5. The mitral valve is normal in structure. Trivial mitral valve regurgitation.  6. The aortic valve is tricuspid. Aortic valve regurgitation is not visualized.  7. The inferior vena cava is dilated in size with <50% respiratory variability, suggesting right atrial pressure of 15 mmHg. FINDINGS  Left Ventricle: Left ventricular ejection fraction, by estimation, is 40 to 45%. The left ventricle has mildly decreased function. The left ventricle demonstrates global hypokinesis. The left ventricular internal cavity size was normal in size. There is  mild left ventricular hypertrophy. Left ventricular diastolic parameters are consistent with Grade II diastolic dysfunction (pseudonormalization). Right Ventricle: The right ventricular size is normal. No increase in right ventricular wall thickness. Right ventricular systolic function is low normal. Left Atrium: Left atrial size was mildly dilated. Right Atrium: Right atrial size was normal in size. Pericardium: A small pericardial effusion is present. Mitral Valve: The mitral valve is normal in structure. Trivial mitral valve regurgitation. Tricuspid Valve: The tricuspid valve is normal in structure. Tricuspid valve regurgitation is mild. Aortic Valve: The aortic valve is tricuspid. Aortic valve regurgitation is not visualized. Aortic valve peak gradient measures 7.5 mmHg. Pulmonic Valve: The pulmonic valve was normal in structure. Pulmonic valve regurgitation is trivial. Aorta: The aortic root is normal in size and structure. Venous: The inferior vena cava is dilated in size with less than 50% respiratory variability, suggesting right atrial pressure of 15 mmHg. IAS/Shunts: No atrial level shunt detected by color flow Doppler.  LEFT VENTRICLE PLAX 2D LVIDd:         5.10 cm  Diastology LVIDs:         3.90 cm  LV e' medial:    4.35 cm/s LV PW:         1.10 cm  LV E/e' medial:  24.8 LV IVS:        1.30 cm  LV e' lateral:  7.51 cm/s LVOT diam:     2.10 cm  LV E/e' lateral: 14.4 LVOT Area:     3.46 cm  RIGHT VENTRICLE RV Mid diam:    3.70 cm RV S prime:     10.10 cm/s TAPSE (M-mode): 1.5 cm LEFT ATRIUM           Index       RIGHT ATRIUM           Index LA diam:      4.10 cm 2.02 cm/m  RA Area:     19.00 cm LA Vol (A4C): 74.8 ml 36.94 ml/m RA Volume:   53.20 ml  26.27 ml/m  AORTIC VALVE                PULMONIC VALVE AV Area (Vmax): 2.38 cm    PV Vmax:        1.03 m/s AV Vmax:        137.00 cm/s PV Peak grad:   4.2 mmHg AV Peak Grad:   7.5 mmHg    RVOT Peak grad: 1 mmHg LVOT Vmax:      94.10 cm/s  AORTA Ao Root diam: 2.50 cm MITRAL VALVE                TRICUSPID VALVE MV Area (PHT): 3.93 cm     TR Peak grad:   42.8 mmHg MV Decel Time: 193 msec     TR Vmax:        327.00 cm/s MV E velocity: 108.00 cm/s MV A velocity: 56.30 cm/s   SHUNTS MV E/A ratio:  1.92         Systemic Diam: 2.10 cm MV A Prime:    4.5 cm/s Kate Sable MD Electronically signed by Kate Sable MD Signature Date/Time: 03/14/2021/5:49:16 PM    Final     Cardiac Studies   Echo 08/2020,  1. Left ventricular ejection fraction, by estimation, is 40 to 45%. The  left ventricle has mildly decreased function. The left ventricle  demonstrates global hypokinesis. There is mild left ventricular  hypertrophy. Left ventricular diastolic parameters  are consistent with Grade II diastolic dysfunction (pseudonormalization).   2. Right ventricular systolic function is low normal. The right  ventricular size is normal.   3. Left atrial size was mildly dilated.   4. A small pericardial effusion is present.   5. The mitral valve is normal in structure. Trivial mitral valve  regurgitation.   6. The aortic valve is tricuspid. Aortic valve regurgitation is not  visualized.   7. The inferior vena cava is dilated in size with <50% respiratory  variability, suggesting right atrial pressure of 15 mmHg.    Patient Profile     60 y.o. male with history of  parox atrial fibrillation, HFrEF EF 30-35%, CKD presenting with edema and volume overload due to running out of diuretics.  Assessment & Plan    HFrEF EF 35%, volume overload -Edema improved with Lasix drip -On Lasix drip and metolazone -However creatinine worsening, net -800 cc over the past 24 hours.  May have to reduce Lasix dose, will defer to nephrology. -Coreg, hydralazine, Imdur. -Avoid nephrotoxic's.  2. Paroxysmal atrial fibrillation -Maintaining sinus rhythm -Continue Coreg, Eliquis  3.  13 beat run of VT -Magnesium, potassium normal -Continue beta-blocker, continue to monitor on telemetry  4.  Acute on chronic CKD  -worsening renal function, creatinine 3.6 today -On IV Lasix and metolazone -Nephrology input appreciated.  Continue to follow recs.  Total  encounter time 35 minutes  Greater than 50% was spent in counseling and coordination of care with the patient     Signed, Kate Sable, MD  03/15/2021, 11:14 AM

## 2021-03-15 NOTE — Progress Notes (Signed)
[facility]  PROGRESS NOTE   Subjective:   Patient denies any shortness of breath. Urine output has improved after starting the metolazone yesterday. Patient still on furosemide drip at 6 mg an hour.  Objective:  Vital signs in last 24 hours:  Temp:  [97.5 F (36.4 C)-98.6 F (37 C)] 98 F (36.7 C) (06/26 1140) Pulse Rate:  [62-67] 62 (06/26 1140) Resp:  [14-17] 17 (06/26 1140) BP: (133-163)/(69-82) 144/79 (06/26 1140) SpO2:  [96 %-98 %] 98 % (06/26 1140) Weight:  [85 kg] 85 kg (06/26 0500)  Weight change: -1.546 kg Filed Weights   03/12/21 2300 03/14/21 0156 03/15/21 0500  Weight: 89.4 kg 86.5 kg 85 kg    Intake/Output: I/O last 3 completed shifts: In: 360 [P.O.:360] Out: 900 [Urine:900]   Intake/Output this shift:  Total I/O In: -  Out: 280 [Urine:280]  Physical Exam: General:  No acute distress  Head:  Normocephalic, atraumatic. Moist oral mucosal membranes  Eyes:  Anicteric  Neck:  Supple  Lungs:   Clear to auscultation, normal effort  Heart:  S1S2 no rubs  Abdomen:   Soft, nontender, bowel sounds present  Extremities:  2 plusperipheral edema.  Neurologic:  Awake, alert, following commands  Skin:  No lesions  Access:     Basic Metabolic Panel: Recent Labs  Lab 03/12/21 0751 03/13/21 0427 03/14/21 0418 03/15/21 0348  NA 146* 146* 144 143  K 2.9* 3.1* 3.7 4.5  CL 111 114* 110 111  CO2 '24 26 30 27  '$ GLUCOSE 121* 140* 213* 121*  BUN 59* 62* 64* 74*  CREATININE 3.20* 3.21* 3.31* 3.62*  CALCIUM 8.1* 7.8* 7.7* 7.6*  MG  --   --   --  2.3    CBC: Recent Labs  Lab 03/12/21 0751 03/13/21 0427  WBC 8.4 7.8  HGB 9.6* 9.3*  HCT 28.3* 27.5*  MCV 94.6 93.5  PLT 385 320     Urinalysis: No results for input(s): COLORURINE, LABSPEC, PHURINE, GLUCOSEU, HGBUR, BILIRUBINUR, KETONESUR, PROTEINUR, UROBILINOGEN, NITRITE, LEUKOCYTESUR in the last 72 hours.  Invalid input(s): APPERANCEUR    Imaging: ECHOCARDIOGRAM COMPLETE  Result  Date: 03/14/2021    ECHOCARDIOGRAM REPORT   Patient Name:   Nathan [name] [facility] Date of Exam: 03/14/2021 Medical Rec #:  [id number]          Height:       69.0 in Accession #:    [id number]         Weight:       190.8 lb Date of Birth:  [birth year]          BSA:          2.025 m Patient Age:    60 years           BP:           145/76 mmHg Patient Gender: M                  HR:           68 bpm. Exam Location:  [facility] Procedure: 2D Echo, Color Doppler and Cardiac Doppler Indications:     Cardiomyopathy-Unspecified I42.9  History:         Patient has prior history of Echocardiogram examinations. CHF,                  Arrythmias:Atrial Fibrillation; Risk Factors:Diabetes.  Sonographer:     Alyse Low Roar Referring Phys:  [id number] Rise Mu Diagnosing Phys: Kate Sable MD IMPRESSIONS  1. Left ventricular ejection fraction, by estimation, is 40 to 45%. The left ventricle has mildly decreased function. The left ventricle demonstrates global hypokinesis. There is mild left ventricular hypertrophy. Left ventricular diastolic parameters are consistent with Grade II diastolic dysfunction (pseudonormalization).  2. Right ventricular systolic function is low normal. The right ventricular size is normal.  3. Left atrial size was mildly dilated.  4. A small pericardial effusion is present.  5. The mitral valve is normal in structure. Trivial mitral valve regurgitation.  6. The aortic valve is tricuspid. Aortic valve regurgitation is not visualized.  7. The inferior vena cava is dilated in size with <50% respiratory variability, suggesting right atrial pressure of 15 mmHg. FINDINGS  Left Ventricle: Left ventricular ejection fraction, by estimation, is 40 to 45%. The left ventricle has mildly decreased function. The left ventricle demonstrates global hypokinesis. The left ventricular internal cavity size was normal in size. There is  mild left ventricular hypertrophy. Left ventricular diastolic parameters are consistent with Grade II  diastolic dysfunction (pseudonormalization). Right Ventricle: The right ventricular size is normal. No increase in right ventricular wall thickness. Right ventricular systolic function is low normal. Left Atrium: Left atrial size was mildly dilated. Right Atrium: Right atrial size was normal in size. Pericardium: A small pericardial effusion is present. Mitral Valve: The mitral valve is normal in structure. Trivial mitral valve regurgitation. Tricuspid Valve: The tricuspid valve is normal in structure. Tricuspid valve regurgitation is mild. Aortic Valve: The aortic valve is tricuspid. Aortic valve regurgitation is not visualized. Aortic valve peak gradient measures 7.5 mmHg. Pulmonic Valve: The pulmonic valve was normal in structure. Pulmonic valve regurgitation is trivial. Aorta: The aortic root is normal in size and structure. Venous: The inferior vena cava is dilated in size with less than 50% respiratory variability, suggesting right atrial pressure of 15 mmHg. IAS/Shunts: No atrial level shunt detected by color flow Doppler.  LEFT VENTRICLE PLAX 2D LVIDd:         5.10 cm  Diastology LVIDs:         3.90 cm  LV e' medial:    4.35 cm/s LV PW:         1.10 cm  LV E/e' medial:  24.8 LV IVS:        1.30 cm  LV e' lateral:   7.51 cm/s LVOT diam:     2.10 cm  LV E/e' lateral: 14.4 LVOT Area:     3.46 cm  RIGHT VENTRICLE RV Mid diam:    3.70 cm RV S prime:     10.10 cm/s TAPSE (M-mode): 1.5 cm LEFT ATRIUM           Index       RIGHT ATRIUM           Index LA diam:      4.10 cm 2.02 cm/m  RA Area:     19.00 cm LA Vol (A4C): 74.8 ml 36.94 ml/m RA Volume:   53.20 ml  26.27 ml/m  AORTIC VALVE                PULMONIC VALVE AV Area (Vmax): 2.38 cm    PV Vmax:        1.03 m/s AV Vmax:        137.00 cm/s PV Peak grad:   4.2 mmHg AV Peak Grad:   7.5 mmHg    RVOT Peak grad: 1 mmHg LVOT Vmax:      94.10 cm/s  AORTA Ao Root diam:  2.50 cm MITRAL VALVE                TRICUSPID VALVE MV Area (PHT): 3.93 cm     TR Peak grad:    42.8 mmHg MV Decel Time: 193 msec     TR Vmax:        327.00 cm/s MV E velocity: 108.00 cm/s MV A velocity: 56.30 cm/s   SHUNTS MV E/A ratio:  1.92         Systemic Diam: 2.10 cm MV A Prime:    4.5 cm/s Kate Sable MD Electronically signed by Kate Sable MD Signature Date/Time: 03/14/2021/5:49:16 PM    Final      Medications:     apixaban  5 mg Oral BID   aspirin EC  81 mg Oral Daily   atorvastatin  40 mg Oral QHS   carvedilol  25 mg Oral BID   feeding supplement (NEPRO CARB STEADY)  237 mL Oral BID BM   ferrous sulfate  325 mg Oral Q breakfast   furosemide  40 mg Intravenous TID   hydrALAZINE  50 mg Oral TID   insulin aspart  0-9 Units Subcutaneous TID WC   isosorbide mononitrate  30 mg Oral BID   metolazone  2.5 mg Oral BID   multivitamin with minerals  1 tablet Oral Daily   potassium chloride  40 mEq Oral BID    Assessment/ Plan:     Principal Problem:   Acute exacerbation of CHF (congestive heart failure) (HCC) Active Problems:   Essential hypertension   Anemia of chronic disease   Acute kidney injury superimposed on CKD (HCC)   PAF (paroxysmal atrial fibrillation) (HCC)   Proteinuria   Hyperlipidemia  60 year old white male with history of hypertension, coronary artery disease, congestive heart failure, hyperlipidemia, CKD stage IV with anasarca and massive proteinuria.  He is now admitted with worsening shortness of breath and leg edema.  Patient was started on furosemide drip at 6 mg an hour.  The urine output is marginal.  He was started on metolazone at 2.5 mg twice a day.   I would like to discontinue the Lasix drip and start him on IV Lasix with 40 mg every 8 hours.   We will continue to supplement the potassium as needed.   Patient is advised on the importance of strict salt restriction and 1 L of fluid limit daily. I advised patient on regular follow-up in the office.  Patient has difficulty obtaining insurance.   LOS: [place], [facility] 6/26/202212:05 PM

## 2021-03-16 LAB — GASTROINTESTINAL PANEL BY PCR, STOOL (REPLACES STOOL CULTURE)

## 2021-03-16 LAB — BASIC METABOLIC PANEL
Anion gap: 7 (ref 5–15)
BUN: 74 mg/dL — ABNORMAL HIGH (ref 6–20)
CO2: 26 mmol/L (ref 22–32)
Calcium: 8.1 mg/dL — ABNORMAL LOW (ref 8.9–10.3)
Chloride: 111 mmol/L (ref 98–111)
Creatinine, Ser: 3.59 mg/dL — ABNORMAL HIGH (ref 0.61–1.24)
GFR, Estimated: 19 mL/min — ABNORMAL LOW (ref >60)
Glucose, Bld: 121 mg/dL — ABNORMAL HIGH (ref 70–99)
Potassium: 5 mmol/L (ref 3.5–5.1)
Sodium: 144 mmol/L (ref 135–145)

## 2021-03-16 LAB — GLUCOSE, CAPILLARY
Glucose-Capillary: 126 mg/dL — ABNORMAL HIGH (ref 70–99)
Glucose-Capillary: 172 mg/dL — ABNORMAL HIGH (ref 70–99)
Glucose-Capillary: 193 mg/dL — ABNORMAL HIGH (ref 70–99)
Glucose-Capillary: 230 mg/dL — ABNORMAL HIGH (ref 70–99)

## 2021-03-16 NOTE — Progress Notes (Signed)
[facility]  ROUNDING NOTE   Subjective:   [name] was admitted to [facility] on 03/12/2021 for Anasarca [R60.1] Acute exacerbation of CHF (congestive heart failure) ([place]) [I50.9] Stage 4 chronic kidney disease ([place]) [N18.4]  Patient was out of his diuretics for approximately one week. He states prior to this he has been gaining weight and retaining fluid. He has kept a fluid restriction and salt restriction.   Patient feels like he back at his baseline Denies shortness of breath Improved appetite despite hospital food     Objective:  Vital signs in last 24 hours:  Temp:  [97.4 F (36.3 C)-98 F (36.7 C)] 97.7 F (36.5 C) (06/27 1134) Pulse Rate:  [61-63] 63 (06/27 1134) Resp:  [16-18] 18 (06/27 1134) BP: (148-167)/(73-88) 148/73 (06/27 1134) SpO2:  [96 %-98 %] 96 % (06/27 1134) Weight:  [87.8 kg] 87.8 kg (06/27 0500)  Weight change: 2.8 kg Filed Weights   03/14/21 0156 03/15/21 0500 03/16/21 0500  Weight: 86.5 kg 85 kg 87.8 kg    Intake/Output: I/O last 3 completed shifts: In: 240 [P.O.:240] Out: 1180 [Urine:1180]   Intake/Output this shift:  Total I/O In: 240 [P.O.:240] Out: 150 [Urine:150]  Physical Exam: General: NAD,   Head: Normocephalic, atraumatic. Moist oral mucosal membranes  Eyes: Anicteric  Lungs:  Clear to auscultation  Heart: Regular rate and rhythm  Abdomen:  Soft, nontender, +abdominal wall edema  Extremities: ++ peripheral edema.  Neurologic: Nonfocal, moving all four extremities  Skin: No lesions  GU External catheter    Basic Metabolic Panel: Recent Labs  Lab 03/12/21 0751 03/13/21 0427 03/14/21 0418 03/15/21 0348 03/16/21 0735  NA 146* 146* 144 143 144  K 2.9* 3.1* 3.7 4.5 5.0  CL 111 114* 110 111 111  CO2 '24 26 30 27 26  '$ GLUCOSE 121* 140* 213* 121* 121*  BUN 59* 62* 64* 74* 74*  CREATININE 3.20* 3.21* 3.31* 3.62* 3.59*  CALCIUM 8.1* 7.8* 7.7* 7.6* 8.1*  MG  --   --   --  2.3  --      Liver  Function Tests: No results for input(s): AST, ALT, ALKPHOS, BILITOT, PROT, ALBUMIN in the last 168 hours. No results for input(s): LIPASE, AMYLASE in the last 168 hours. No results for input(s): AMMONIA in the last 168 hours.  CBC: Recent Labs  Lab 03/12/21 0751 03/13/21 0427  WBC 8.4 7.8  HGB 9.6* 9.3*  HCT 28.3* 27.5*  MCV 94.6 93.5  PLT 385 320     Cardiac Enzymes: No results for input(s): CKTOTAL, CKMB, CKMBINDEX, TROPONINI in the last 168 hours.  BNP: Invalid input(s): POCBNP  CBG: Recent Labs  Lab 03/15/21 1141 03/15/21 1647 03/15/21 2059 03/16/21 0749 03/16/21 1133  GLUCAP 99 107* 139* 126* 172*    Microbiology: Results for orders placed or performed during the hospital encounter of 03/12/21  Resp Panel by RT-PCR (Flu A&B, Covid) Nasopharyngeal Swab     Status: None   Collection Time: 03/12/21 10:47 AM   Specimen: Nasopharyngeal Swab; Nasopharyngeal(NP) swabs in vial transport medium  Result Value Ref Range Status   SARS Coronavirus 2 by RT PCR NEGATIVE NEGATIVE Final    Comment: (NOTE) SARS-CoV-2 target nucleic acids are NOT DETECTED.  The SARS-CoV-2 RNA is generally detectable in upper respiratory specimens during the acute phase of infection. The lowest concentration of SARS-CoV-2 viral copies this assay can detect is 138 copies/mL. A negative result does not preclude SARS-Cov-2 infection and should not be used as  the sole basis for treatment or other patient management decisions. A negative result may occur with  improper specimen collection/handling, submission of specimen other than nasopharyngeal swab, presence of viral mutation(s) within the areas targeted by this assay, and inadequate number of viral copies(<138 copies/mL). A negative result must be combined with clinical observations, patient history, and epidemiological information. The expected result is Negative.  Fact Sheet for Patients:  EntrepreneurPulse.com.au  Fact  Sheet for Healthcare Providers:  IncredibleEmployment.be  This test is no t yet approved or cleared by the [place] FDA and  has been authorized for detection and/or diagnosis of SARS-CoV-2 by FDA under an Emergency Use Authorization (EUA). This EUA will remain  in effect (meaning this test can be used) for the duration of the COVID-19 declaration under Section 564(b)(1) of the Act, 21 U.S.C.section 360bbb-3(b)(1), unless the authorization is terminated  or revoked sooner.       Influenza A by PCR NEGATIVE NEGATIVE Final   Influenza B by PCR NEGATIVE NEGATIVE Final    Comment: (NOTE) The Xpert Xpress SARS-CoV-2/FLU/RSV plus assay is intended as an aid in the diagnosis of influenza from Nasopharyngeal swab specimens and should not be used as a sole basis for treatment. Nasal washings and aspirates are unacceptable for Xpert Xpress SARS-CoV-2/FLU/RSV testing.  Fact Sheet for Patients: EntrepreneurPulse.com.au  Fact Sheet for Healthcare Providers: IncredibleEmployment.be  This test is not yet approved or cleared by the [place] FDA and has been authorized for detection and/or diagnosis of SARS-CoV-2 by FDA under an Emergency Use Authorization (EUA). This EUA will remain in effect (meaning this test can be used) for the duration of the COVID-19 declaration under Section 564(b)(1) of the Act, 21 U.S.C. section 360bbb-3(b)(1), unless the authorization is terminated or revoked.  Performed at [name], [address] [place], [place] [address]   Gastrointestinal Panel by PCR , Stool     Status: None   Collection Time: 03/16/21  9:59 AM   Specimen: Stool  Result Value Ref Range Status   Campylobacter species NOT DETECTED NOT DETECTED Final   Plesimonas shigelloides NOT DETECTED NOT DETECTED Final   Salmonella species NOT DETECTED NOT DETECTED Final   Yersinia enterocolitica NOT DETECTED NOT DETECTED  Final   Vibrio species NOT DETECTED NOT DETECTED Final   Vibrio cholerae NOT DETECTED NOT DETECTED Final   Enteroaggregative E coli (EAEC) NOT DETECTED NOT DETECTED Final   Enteropathogenic E coli (EPEC) NOT DETECTED NOT DETECTED Final   Enterotoxigenic E coli (ETEC) NOT DETECTED NOT DETECTED Final   Shiga like toxin producing E coli (STEC) NOT DETECTED NOT DETECTED Final   Shigella/Enteroinvasive E coli (EIEC) NOT DETECTED NOT DETECTED Final   Cryptosporidium NOT DETECTED NOT DETECTED Final   Cyclospora cayetanensis NOT DETECTED NOT DETECTED Final   Entamoeba histolytica NOT DETECTED NOT DETECTED Final   Giardia lamblia NOT DETECTED NOT DETECTED Final   Adenovirus F40/41 NOT DETECTED NOT DETECTED Final   Astrovirus NOT DETECTED NOT DETECTED Final   Norovirus GI/GII NOT DETECTED NOT DETECTED Final   Rotavirus A NOT DETECTED NOT DETECTED Final   Sapovirus (I, II, IV, and V) NOT DETECTED NOT DETECTED Final    Comment: Performed at [facility], [address] [place], [place] [address]    Coagulation Studies: No results for input(s): LABPROT, INR in the last 72 hours.  Urinalysis: No results for input(s): COLORURINE, LABSPEC, PHURINE, GLUCOSEU, HGBUR, BILIRUBINUR, KETONESUR, PROTEINUR, UROBILINOGEN, NITRITE, LEUKOCYTESUR in the last 72 hours.  Invalid input(s):  APPERANCEUR    Imaging: ECHOCARDIOGRAM COMPLETE  Result Date: 03/14/2021    ECHOCARDIOGRAM REPORT   Patient Name:   Nathan [name] Date of Exam: 03/14/2021 Medical Rec #:  [id number]          Height:       69.0 in Accession #:    [id number]         Weight:       190.8 lb Date of Birth:  [birth year]          BSA:          2.025 m Patient Age:    40 years           BP:           145/76 mmHg Patient Gender: M                  HR:           68 bpm. Exam Location:  [facility] Procedure: 2D Echo, Color Doppler and Cardiac Doppler Indications:     Cardiomyopathy-Unspecified I42.9  History:         Patient has prior history of  Echocardiogram examinations. CHF,                  Arrythmias:Atrial Fibrillation; Risk Factors:Diabetes.  Sonographer:     Alyse Low Roar Referring Phys:  [id number] Rise Mu Diagnosing Phys: Kate Sable MD IMPRESSIONS  1. Left ventricular ejection fraction, by estimation, is 40 to 45%. The left ventricle has mildly decreased function. The left ventricle demonstrates global hypokinesis. There is mild left ventricular hypertrophy. Left ventricular diastolic parameters are consistent with Grade II diastolic dysfunction (pseudonormalization).  2. Right ventricular systolic function is low normal. The right ventricular size is normal.  3. Left atrial size was mildly dilated.  4. A small pericardial effusion is present.  5. The mitral valve is normal in structure. Trivial mitral valve regurgitation.  6. The aortic valve is tricuspid. Aortic valve regurgitation is not visualized.  7. The inferior vena cava is dilated in size with <50% respiratory variability, suggesting right atrial pressure of 15 mmHg. FINDINGS  Left Ventricle: Left ventricular ejection fraction, by estimation, is 40 to 45%. The left ventricle has mildly decreased function. The left ventricle demonstrates global hypokinesis. The left ventricular internal cavity size was normal in size. There is  mild left ventricular hypertrophy. Left ventricular diastolic parameters are consistent with Grade II diastolic dysfunction (pseudonormalization). Right Ventricle: The right ventricular size is normal. No increase in right ventricular wall thickness. Right ventricular systolic function is low normal. Left Atrium: Left atrial size was mildly dilated. Right Atrium: Right atrial size was normal in size. Pericardium: A small pericardial effusion is present. Mitral Valve: The mitral valve is normal in structure. Trivial mitral valve regurgitation. Tricuspid Valve: The tricuspid valve is normal in structure. Tricuspid valve regurgitation is mild. Aortic Valve: The  aortic valve is tricuspid. Aortic valve regurgitation is not visualized. Aortic valve peak gradient measures 7.5 mmHg. Pulmonic Valve: The pulmonic valve was normal in structure. Pulmonic valve regurgitation is trivial. Aorta: The aortic root is normal in size and structure. Venous: The inferior vena cava is dilated in size with less than 50% respiratory variability, suggesting right atrial pressure of 15 mmHg. IAS/Shunts: No atrial level shunt detected by color flow Doppler.  LEFT VENTRICLE PLAX 2D LVIDd:         5.10 cm  Diastology LVIDs:         3.90 cm  LV e' medial:    4.35 cm/s LV PW:         1.10 cm  LV E/e' medial:  24.8 LV IVS:        1.30 cm  LV e' lateral:   7.51 cm/s LVOT diam:     2.10 cm  LV E/e' lateral: 14.4 LVOT Area:     3.46 cm  RIGHT VENTRICLE RV Mid diam:    3.70 cm RV S prime:     10.10 cm/s TAPSE (M-mode): 1.5 cm LEFT ATRIUM           Index       RIGHT ATRIUM           Index LA diam:      4.10 cm 2.02 cm/m  RA Area:     19.00 cm LA Vol (A4C): 74.8 ml 36.94 ml/m RA Volume:   53.20 ml  26.27 ml/m  AORTIC VALVE                PULMONIC VALVE AV Area (Vmax): 2.38 cm    PV Vmax:        1.03 m/s AV Vmax:        137.00 cm/s PV Peak grad:   4.2 mmHg AV Peak Grad:   7.5 mmHg    RVOT Peak grad: 1 mmHg LVOT Vmax:      94.10 cm/s  AORTA Ao Root diam: 2.50 cm MITRAL VALVE                TRICUSPID VALVE MV Area (PHT): 3.93 cm     TR Peak grad:   42.8 mmHg MV Decel Time: 193 msec     TR Vmax:        327.00 cm/s MV E velocity: 108.00 cm/s MV A velocity: 56.30 cm/s   SHUNTS MV E/A ratio:  1.92         Systemic Diam: 2.10 cm MV A Prime:    4.5 cm/s Kate Sable MD Electronically signed by Kate Sable MD Signature Date/Time: 03/14/2021/5:49:16 PM    Final      Medications:      apixaban  5 mg Oral BID   aspirin EC  81 mg Oral Daily   atorvastatin  40 mg Oral QHS   carvedilol  25 mg Oral BID   feeding supplement (NEPRO CARB STEADY)  237 mL Oral BID BM   ferrous sulfate  325 mg Oral Q  breakfast   furosemide  40 mg Intravenous TID   hydrALAZINE  50 mg Oral TID   insulin aspart  0-9 Units Subcutaneous TID WC   isosorbide mononitrate  30 mg Oral BID   metolazone  2.5 mg Oral BID   multivitamin with minerals  1 tablet Oral Daily   potassium chloride  40 mEq Oral BID   ondansetron **OR** ondansetron (ZOFRAN) IV  Assessment/ Plan:  [name] is a 60 y.o. white male hypertension, hyperlipidemia, congestive heart failure, diabetes mellitus type II, atrial fibrillation, COPD who is admitted to [facility] on 03/12/2021 for Anasarca [R60.1] Acute exacerbation of CHF (congestive heart failure) ([facility]) [I50.9] Stage 4 chronic kidney disease ([place]) [N18.4]  Acute kidney injury on chronic kidney disease stage IV: with proteinuria. Baseline creatinine of 2.72, GFR of 25 on 11/05/2020. Followed by [facility], Dr. Lanora Manis. Chronic kidney disease secondary to diabetes and hypertension. Acute kidney injury secondary to acute cardiorenal syndrome. Creatinine currently 3.59. will monitor  Hypertension: elevated 148/73. Volume driven Home regimen of torsemide,  carvedilol, isosorbide mononitrate and hydralazine. BP improved from previous readings. Transitioned to IV lasix '40mg'$  TID from Lasix drip yesterday. Metolazone 2.'5mg'$  BID ordered. Continue to IV Lasix for another 24 hours and reassess.    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 IV Lasix as prescribed above Low salt diet. Fluid restriction. Daily standing weights  Anemia with chronic kidney disease: hemoglobin 9.3, normocytic. History of iron deficiency.    LOS: 3 Charliene Inoue 6/27/202212:25 PM

## 2021-03-16 NOTE — Plan of Care (Signed)

## 2021-03-16 NOTE — Progress Notes (Signed)
PROGRESS NOTE    Nathan [name]  [id number] DOB: 04/14/1961 DOA: 03/12/2021 PCP: Derinda Late, MD    Brief Narrative:  Nathan [name] is a 60 y.o. male with medical history significant for hypertension, hyperlipidemia, paf on eliquis, CKD4, combined heart failure, medication noncompliance presents to the emergency department for chief concern of shortness of breath.  6/24 patient endorsed 20 pound weight gain within the last week to me this AM.  Patient is wearing pants and socks and I am unable to evaluate his legs well, when I asked him if he would be willing to change into hospital gown he refused.  I do notice he has edema up to his thighs.  6/25-complaining the lasix gtt is going to slow and if we can make it go faster so he can go home sooner. Explained pharmacies protocal on adminis. of gtt and to expect couple of more days of hospitalization to stablize him  as he is still volume overloaded.   6/26-had 13 beats of VT on telemetry.  However patient does not believe it.  He thinks the monitor is wrong.  Urine output improved after metolazone.  Patient also had foul-smelling diarrhea this AM.  6/27 off of Lasix drip.  Receiving IV Lasix.  Denies shortness of breath.  Lower extremity edema improving slowly.  Has no complaints.  Still with diarrhea but he is he reports it is chronic and the food here does not agree with him.  Consultants:  Cardiology, nephrology  Procedures:   Antimicrobials:      Subjective: No sob, cp   Objective: Vitals:   03/15/21 1646 03/15/21 2057 03/16/21 0500 03/16/21 0750  BP: (!) 148/75 (!) 161/75 (!) 161/88 (!) 167/79  Pulse: 61 62 63 63  Resp: '16 17 17 18  '$ Temp: 97.9 F (36.6 C) 97.8 F (36.6 C) 98 F (36.7 C) (!) 97.4 F (36.3 C)  TempSrc:      SpO2: 96% 98% 98% 98%  Weight:   87.8 kg   Height:        Intake/Output Summary (Last 24 hours) at 03/16/2021 0850 Last data filed at 03/15/2021 1355 Gross per 24 hour  Intake 240  ml  Output --  Net 240 ml   Filed Weights   03/14/21 0156 03/15/21 0500 03/16/21 0500  Weight: 86.5 kg 85 kg 87.8 kg    Examination: Calm, NAD Decreased breath sounds clear to auscultation otherwise Regular S1-S2 no gallops Soft benign positive bowel sounds 2+ pitting edema overall improving Awake and alert x4 Grossly intact   Data Reviewed: I have personally reviewed following labs and imaging studies  CBC: Recent Labs  Lab 03/12/21 0751 03/13/21 0427  WBC 8.4 7.8  HGB 9.6* 9.3*  HCT 28.3* 27.5*  MCV 94.6 93.5  PLT 385 [id number]   Basic Metabolic Panel: Recent Labs  Lab 03/12/21 0751 03/13/21 0427 03/14/21 0418 03/15/21 0348 03/16/21 0735  NA 146* 146* 144 143 144  K 2.9* 3.1* 3.7 4.5 5.0  CL 111 114* 110 111 111  CO2 '24 26 30 27 26  '$ GLUCOSE 121* 140* 213* 121* 121*  BUN 59* 62* 64* 74* 74*  CREATININE 3.20* 3.21* 3.31* 3.62* 3.59*  CALCIUM 8.1* 7.8* 7.7* 7.6* 8.1*  MG  --   --   --  2.3  --    GFR: Estimated Creatinine Clearance: 24.6 mL/min (A) (by C-G formula based on SCr of 3.59 mg/dL (H)). Liver Function Tests: No results for input(s): AST, ALT, ALKPHOS, BILITOT, PROT,  ALBUMIN in the last 168 hours. No results for input(s): LIPASE, AMYLASE in the last 168 hours. No results for input(s): AMMONIA in the last 168 hours. Coagulation Profile: No results for input(s): INR, PROTIME in the last 168 hours. Cardiac Enzymes: No results for input(s): CKTOTAL, CKMB, CKMBINDEX, TROPONINI in the last 168 hours. BNP (last 3 results) No results for input(s): PROBNP in the last 8760 hours. HbA1C: No results for input(s): HGBA1C in the last 72 hours. CBG: Recent Labs  Lab 03/15/21 0747 03/15/21 1141 03/15/21 1647 03/15/21 2059 03/16/21 0749  GLUCAP 108* 99 107* 139* 126*   Lipid Profile: No results for input(s): CHOL, HDL, LDLCALC, TRIG, CHOLHDL, LDLDIRECT in the last 72 hours. Thyroid Function Tests: No results for input(s): TSH, T4TOTAL, FREET4, T3FREE,  THYROIDAB in the last 72 hours. Anemia Panel: No results for input(s): VITAMINB12, FOLATE, FERRITIN, TIBC, IRON, RETICCTPCT in the last 72 hours.  Sepsis Labs: No results for input(s): PROCALCITON, LATICACIDVEN in the last 168 hours.  Recent Results (from the past 240 hour(s))  Resp Panel by RT-PCR (Flu A&B, Covid) Nasopharyngeal Swab     Status: None   Collection Time: 03/12/21 10:47 AM   Specimen: Nasopharyngeal Swab; Nasopharyngeal(NP) swabs in vial transport medium  Result Value Ref Range Status   SARS Coronavirus 2 by RT PCR NEGATIVE NEGATIVE Final    Comment: (NOTE) SARS-CoV-2 target nucleic acids are NOT DETECTED.  The SARS-CoV-2 RNA is generally detectable in upper respiratory specimens during the acute phase of infection. The lowest concentration of SARS-CoV-2 viral copies this assay can detect is 138 copies/mL. A negative result does not preclude SARS-Cov-2 infection and should not be used as the sole basis for treatment or other patient management decisions. A negative result may occur with  improper specimen collection/handling, submission of specimen other than nasopharyngeal swab, presence of viral mutation(s) within the areas targeted by this assay, and inadequate number of viral copies(<138 copies/mL). A negative result must be combined with clinical observations, patient history, and epidemiological information. The expected result is Negative.  Fact Sheet for Patients:  EntrepreneurPulse.com.au  Fact Sheet for Healthcare Providers:  IncredibleEmployment.be  This test is no t yet approved or cleared by the [place] FDA and  has been authorized for detection and/or diagnosis of SARS-CoV-2 by FDA under an Emergency Use Authorization (EUA). This EUA will remain  in effect (meaning this test can be used) for the duration of the COVID-19 declaration under Section 564(b)(1) of the Act, 21 U.S.C.section 360bbb-3(b)(1), unless  the authorization is terminated  or revoked sooner.       Influenza A by PCR NEGATIVE NEGATIVE Final   Influenza B by PCR NEGATIVE NEGATIVE Final    Comment: (NOTE) The Xpert Xpress SARS-CoV-2/FLU/RSV plus assay is intended as an aid in the diagnosis of influenza from Nasopharyngeal swab specimens and should not be used as a sole basis for treatment. Nasal washings and aspirates are unacceptable for Xpert Xpress SARS-CoV-2/FLU/RSV testing.  Fact Sheet for Patients: EntrepreneurPulse.com.au  Fact Sheet for Healthcare Providers: IncredibleEmployment.be  This test is not yet approved or cleared by the [place] FDA and has been authorized for detection and/or diagnosis of SARS-CoV-2 by FDA under an Emergency Use Authorization (EUA). This EUA will remain in effect (meaning this test can be used) for the duration of the COVID-19 declaration under Section 564(b)(1) of the Act, 21 U.S.C. section 360bbb-3(b)(1), unless the authorization is terminated or revoked.  Performed at [facility], [place], [place]  [address]          Radiology Studies: ECHOCARDIOGRAM COMPLETE  Result Date: 03/14/2021    ECHOCARDIOGRAM REPORT   Patient Name:   TREVOR HERRERA [facility] Date of Exam: 03/14/2021 Medical Rec #:  [id number]          Height:       69.0 in Accession #:    [id number]         Weight:       190.8 lb Date of Birth:  1960/10/23          BSA:          2.025 m Patient Age:    50 years           BP:           145/76 mmHg Patient Gender: M                  HR:           68 bpm. Exam Location:  [facility] Procedure: 2D Echo, Color Doppler and Cardiac Doppler Indications:     Cardiomyopathy-Unspecified I42.9  History:         Patient has prior history of Echocardiogram examinations. CHF,                  Arrythmias:Atrial Fibrillation; Risk Factors:Diabetes.  Sonographer:     Alyse Low Roar Referring Phys:  [id number] Rise Mu Diagnosing Phys:  Kate Sable MD IMPRESSIONS  1. Left ventricular ejection fraction, by estimation, is 40 to 45%. The left ventricle has mildly decreased function. The left ventricle demonstrates global hypokinesis. There is mild left ventricular hypertrophy. Left ventricular diastolic parameters are consistent with Grade II diastolic dysfunction (pseudonormalization).  2. Right ventricular systolic function is low normal. The right ventricular size is normal.  3. Left atrial size was mildly dilated.  4. A small pericardial effusion is present.  5. The mitral valve is normal in structure. Trivial mitral valve regurgitation.  6. The aortic valve is tricuspid. Aortic valve regurgitation is not visualized.  7. The inferior vena cava is dilated in size with <50% respiratory variability, suggesting right atrial pressure of 15 mmHg. FINDINGS  Left Ventricle: Left ventricular ejection fraction, by estimation, is 40 to 45%. The left ventricle has mildly decreased function. The left ventricle demonstrates global hypokinesis. The left ventricular internal cavity size was normal in size. There is  mild left ventricular hypertrophy. Left ventricular diastolic parameters are consistent with Grade II diastolic dysfunction (pseudonormalization). Right Ventricle: The right ventricular size is normal. No increase in right ventricular wall thickness. Right ventricular systolic function is low normal. Left Atrium: Left atrial size was mildly dilated. Right Atrium: Right atrial size was normal in size. Pericardium: A small pericardial effusion is present. Mitral Valve: The mitral valve is normal in structure. Trivial mitral valve regurgitation. Tricuspid Valve: The tricuspid valve is normal in structure. Tricuspid valve regurgitation is mild. Aortic Valve: The aortic valve is tricuspid. Aortic valve regurgitation is not visualized. Aortic valve peak gradient measures 7.5 mmHg. Pulmonic Valve: The pulmonic valve was normal in structure. Pulmonic  valve regurgitation is trivial. Aorta: The aortic root is normal in size and structure. Venous: The inferior vena cava is dilated in size with less than 50% respiratory variability, suggesting right atrial pressure of 15 mmHg. IAS/Shunts: No atrial level shunt detected by color flow Doppler.  LEFT VENTRICLE PLAX 2D LVIDd:         5.10 cm  Diastology LVIDs:  3.90 cm  LV e' medial:    4.35 cm/s LV PW:         1.10 cm  LV E/e' medial:  24.8 LV IVS:        1.30 cm  LV e' lateral:   7.51 cm/s LVOT diam:     2.10 cm  LV E/e' lateral: 14.4 LVOT Area:     3.46 cm  RIGHT VENTRICLE RV Mid diam:    3.70 cm RV S prime:     10.10 cm/s TAPSE (M-mode): 1.5 cm LEFT ATRIUM           Index       RIGHT ATRIUM           Index LA diam:      4.10 cm 2.02 cm/m  RA Area:     19.00 cm LA Vol (A4C): 74.8 ml 36.94 ml/m RA Volume:   53.20 ml  26.27 ml/m  AORTIC VALVE                PULMONIC VALVE AV Area (Vmax): 2.38 cm    PV Vmax:        1.03 m/s AV Vmax:        137.00 cm/s PV Peak grad:   4.2 mmHg AV Peak Grad:   7.5 mmHg    RVOT Peak grad: 1 mmHg LVOT Vmax:      94.10 cm/s  AORTA Ao Root diam: 2.50 cm MITRAL VALVE                TRICUSPID VALVE MV Area (PHT): 3.93 cm     TR Peak grad:   42.8 mmHg MV Decel Time: 193 msec     TR Vmax:        327.00 cm/s MV E velocity: 108.00 cm/s MV A velocity: 56.30 cm/s   SHUNTS MV E/A ratio:  1.92         Systemic Diam: 2.10 cm MV A Prime:    4.5 cm/s Kate Sable MD Electronically signed by Kate Sable MD Signature Date/Time: 03/14/2021/5:49:16 PM    Final         Scheduled Meds:  apixaban  5 mg Oral BID   aspirin EC  81 mg Oral Daily   atorvastatin  40 mg Oral QHS   carvedilol  25 mg Oral BID   feeding supplement (NEPRO CARB STEADY)  237 mL Oral BID BM   ferrous sulfate  325 mg Oral Q breakfast   furosemide  40 mg Intravenous TID   hydrALAZINE  50 mg Oral TID   insulin aspart  0-9 Units Subcutaneous TID WC   isosorbide mononitrate  30 mg Oral BID   metolazone  2.5  mg Oral BID   multivitamin with minerals  1 tablet Oral Daily   potassium chloride  40 mEq Oral BID   Continuous Infusions:    Assessment & Plan:   Principal Problem:   Acute exacerbation of CHF (congestive heart failure) (HCC) Active Problems:   Essential hypertension   Anemia of chronic disease   Acute kidney injury superimposed on CKD (HCC)   PAF (paroxysmal atrial fibrillation) (HCC)   Proteinuria   Hyperlipidemia   # Shortness of breath-due to acute on chronic combined heart failure and CKD stage IV Medical noncompliance BNP very elevated on admission  6/26 clinically slowly improving Nephrology cardiology on board On Lasix drip and better output with addition of metolazone Nephrology will switch Lasix drip to Lasix 40 mg every 8 hours  Monitor lites Strict salt restriction and 1 L fluid limitation daily 6/27 clinically improving Will continue with IV Lasix    # Acute kidney injury on CKD stage IV-secondary to cardiorenal  Nephrology following 6/27 creatinine 3.59 Continue IV Lasix Avoid nephrotoxic meds    # Hypokalemia- Supplemented now stable  Continue to monitor     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-in acute exacerbation  - Echo on 09/02/2020 showed ejection fraction 30 to 35%, left ventricular global hypokinesis, grade 2 diastolic dysfunction, right ventricle systolic function is moderately decreased, left and right atrial moderately dilated 6/27 continue Lasix IV 3 times daily Continue Coreg, hydralazine, Imdur Will need to follow-up with CHF clinic on discharge    #13 beat run of VT Cardiology following Mag and potassium normal Continue beta-blockers 6/27 per cardiology assess cardiomyopathy remains of uncertain etiology would recommend ischemic evaluation as outpatient and follow-up once he is adequately diuresed, and impending renal function.  Currently he is not a candidate for coronary CTA or diagnostic cardiac  cath given his AKI.  Best option likely Lexiscan MPI   #Hypertension -improving with diuresis  Continue current management    #diarrhea-foul smelling Pt also reports had diarrhea at home.  Will ck c.diff  # Hyperlipidemia-continue statins # Paroxysmal atrial fibrillation-continue Eliquis # 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   Difficult to place patient No            LOS: 3 days   Time spent: 35 minutes with more than 50% on [place], MD [facility] Pager [phone]  If 7PM-7AM, please contact night-coverage 03/16/2021, 8:50 AM

## 2021-03-16 NOTE — Progress Notes (Signed)
   Heart Failure Nurse Navigator Note  HrmrEF 40-45%, grade II Diastolic Dysfunction  The Lasix infusion has been discontinued, he is currently on Lasix 40 mg 3 times a day in addition to metolazone 2.5 mg 2 times a day.   He remains edematous and is pitting from his feet to his abdomen.  Secure chatted with the nurse that patient should TED hose thigh-high or Ace wraps to help with mobilization of that fluid.  Met with patient and his wife today, they did not have any further questions but stated that talking with the nutritionist helped.  Stressed again the importance of daily weights and recording.  Also to report 2 pound weight gain overnight or 5 pounds within a week.  He voices understanding.  Also pointed out to the patient that there is a laminated intake and output sheet that the staff should be documenting what he is putting in and putting out.   Jimsey Shaffer RN CHFN 

## 2021-03-16 NOTE — Progress Notes (Signed)
Progress Note  Patient Name: Nathan [name] Date of Encounter: 03/16/2021  Primary Cardiologist: Rockey Situ  Subjective   He feels his lower extremity swelling is improving, though continues to persist. No chest pain or dyspnea. Documented UOP ~ 40 mL for the past 24 hours with a net - 815 mL for the admission. Renal function largely unchanged from 6/26, and remains above baseline.   Inpatient Medications    Scheduled Meds:  apixaban  5 mg Oral BID   aspirin EC  81 mg Oral Daily   atorvastatin  40 mg Oral QHS   carvedilol  25 mg Oral BID   feeding supplement (NEPRO CARB STEADY)  237 mL Oral BID BM   ferrous sulfate  325 mg Oral Q breakfast   furosemide  40 mg Intravenous TID   hydrALAZINE  50 mg Oral TID   insulin aspart  0-9 Units Subcutaneous TID WC   isosorbide mononitrate  30 mg Oral BID   metolazone  2.5 mg Oral BID   multivitamin with minerals  1 tablet Oral Daily   potassium chloride  40 mEq Oral BID   Continuous Infusions:  PRN Meds: ondansetron **OR** ondansetron (ZOFRAN) IV   Vital Signs    Vitals:   03/15/21 1646 03/15/21 2057 03/16/21 0500 03/16/21 0750  BP: (!) 148/75 (!) 161/75 (!) 161/88 (!) 167/79  Pulse: 61 62 63 63  Resp: '16 17 17 18  '$ Temp: 97.9 F (36.6 C) 97.8 F (36.6 C) 98 F (36.7 C) (!) 97.4 F (36.3 C)  TempSrc:      SpO2: 96% 98% 98% 98%  Weight:   87.8 kg   Height:        Intake/Output Summary (Last 24 hours) at 03/16/2021 1130 Last data filed at 03/16/2021 1001 Gross per 24 hour  Intake 480 ml  Output 100 ml  Net 380 ml   Filed Weights   03/14/21 0156 03/15/21 0500 03/16/21 0500  Weight: 86.5 kg 85 kg 87.8 kg    Telemetry    SR with artifact - Personally Reviewed  ECG    No new tracings - Personally Reviewed  Physical Exam   GEN: No acute distress.   Neck: No JVD. Cardiac: RRR, no murmurs, rubs, or gallops.  Respiratory: Faint bibasilar crackles.  GI: Soft, nontender, non-distended.   MS: 1-2+ bilateral lower  extremity pitting edema to the thighs; No deformity. Neuro:  Alert and oriented x 3; Nonfocal.  Psych: Normal affect.  Labs    Chemistry Recent Labs  Lab 03/14/21 0418 03/15/21 0348 03/16/21 0735  NA 144 143 144  K 3.7 4.5 5.0  CL 110 111 111  CO2 '30 27 26  '$ GLUCOSE 213* 121* 121*  BUN 64* 74* 74*  CREATININE 3.31* 3.62* 3.59*  CALCIUM 7.7* 7.6* 8.1*  GFRNONAA 21* 19* 19*  ANIONGAP 4* 5 7     Hematology Recent Labs  Lab 03/12/21 0751 03/13/21 0427  WBC 8.4 7.8  RBC 2.99* 2.94*  HGB 9.6* 9.3*  HCT 28.3* 27.5*  MCV 94.6 93.5  MCH 32.1 31.6  MCHC 33.9 33.8  RDW 13.0 13.0  PLT 385 320    Cardiac EnzymesNo results for input(s): TROPONINI in the last 168 hours. No results for input(s): TROPIPOC in the last 168 hours.   BNP Recent Labs  Lab 03/12/21 0751  BNP 2,701.7*     DDimer No results for input(s): DDIMER in the last 168 hours.   Radiology    CXR 03/12/2021: IMPRESSION:  1. Cardiomegaly with bilateral interstitial prominence. Interstitial prominence consistent interstitial edema and or pneumonitis.   2. Left base atelectasis and consolidation. Left base pneumonia cannot be excluded. Small left pleural effusion.  Cardiac Studies   2D echo 09/02/2020: 1. Left ventricular ejection fraction, by estimation, is 30 to 35%. The  left ventricle has moderately decreased function. The left ventricle  demonstrates global hypokinesis. There is mild left ventricular  hypertrophy. Left ventricular diastolic  parameters are consistent with Grade II diastolic dysfunction  (pseudonormalization). Elevated left atrial pressure. There is the  interventricular septum is flattened in systole and diastole, consistent  with right ventricular pressure and volume overload.   2. Right ventricular systolic function is moderately reduced. The right  ventricular size is mildly enlarged. There is severely elevated pulmonary  artery systolic pressure.   3. Left atrial size was  mildly dilated.   4. Right atrial size was mildly dilated.   5. A small pericardial effusion is present. The pericardial effusion is  posterior to the left ventricle.   6. The mitral valve is normal in structure. Mild mitral valve  regurgitation. No evidence of mitral stenosis.   7. Tricuspid valve regurgitation is moderate to severe.   8. The aortic valve is tricuspid. Aortic valve regurgitation is not  visualized. No aortic stenosis is present.   9. The inferior vena cava is dilated in size with <50% respiratory  variability, suggesting right atrial pressure of 15 mmHg. __________  2D echo 03/14/2021: 1. Left ventricular ejection fraction, by estimation, is 40 to 45%. The  left ventricle has mildly decreased function. The left ventricle  demonstrates global hypokinesis. There is mild left ventricular  hypertrophy. Left ventricular diastolic parameters  are consistent with Grade II diastolic dysfunction (pseudonormalization).   2. Right ventricular systolic function is low normal. The right  ventricular size is normal.   3. Left atrial size was mildly dilated.   4. A small pericardial effusion is present.   5. The mitral valve is normal in structure. Trivial mitral valve  regurgitation.   6. The aortic valve is tricuspid. Aortic valve regurgitation is not  visualized.   7. The inferior vena cava is dilated in size with <50% respiratory  variability, suggesting right atrial pressure of 15 mmHg.  Patient Profile     60 y.o. 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  Kurtis Bushman.  Assessment & Plan    1. Acute on chronic combined systolic and diastolic CHF/cardiomyopathy of uncertain etiology: -Exacerbated by running out of carvedilol and torsemide approximately 1 week prior to his presentation -Diuresis per nephrology -If augmentation of diuresis is needed could consider transfer to the ICU with initiation of milrinone -Echo this admission shows a slightly improved LVSF -Coreg -Advanced kidney disease precludes addition of ACEi/ARB/ARNI/MRA/SLGT2i -Daily weights -Strict I/O   2.  Acute on CKD stage III with hypokalemia: -Management per nephrology, appreciate their assistance -Previously referred to nephrology though was a no-show   3.  PAF: -Maintaining sinus rhythm -CHA2DS2-VASc at least 4 (CHF, HTN, DM, vascular disease) -Reports he is not taking Eliquis at home secondary to renal dysfunction -Compliance with [place] is recommended,  if he has concerns with [place] he can discuss this with his primary cardiologist -Currently receiving Eliquis 5 mg twice daily as he does not meet reduced dosing criteria   4.  Coronary artery calcifications with elevated high-sensitivity troponin: -No symptoms of angina or dyspnea this admission -Minimally elevated and flat trending high-sensitivity troponin peaking at 81, not consistent with ACS -Echo as above -ASA -Lipitor -As his cardiomyopathy remains of uncertain etiology, would recommend ischemic evaluation as an outpatient in follow-up once he is adequately diuresed, and pending renal function trend; as he is not currently a candidate for coronary CTA or diagnostic cardiac cath given his acute on CKD our best option would likely be a Lexiscan MPI   5.  Anemia of chronic disease: -Stable -Monitor   6.  HTN: -Improving -Diuresis as above -Carvedilol, hydralazine, isosorbide -Low-sodium diet  7. NSVT: -No further ventricular ectopy on telemetry -Coreg -Potassium high normal -Magnesium at goal  8.  HLD: -Last LDL 92 in 10/2020 -Lipitor   For questions or updates, please contact [place] Please consult [email or web address] for contact info under Cardiology/STEMI.    Signed, Christell Faith, PA-C [place] Pager: [phone] 03/16/2021, 11:30 AM

## 2021-03-16 NOTE — Progress Notes (Signed)
Pt is A&O, VS stable, NSR on the monitor. IV in place. Primofit in place. No complaints of pain or discomfort. Slept well

## 2021-03-17 DIAGNOSIS — E440 Moderate protein-calorie malnutrition: Secondary | ICD-10-CM | POA: Insufficient documentation

## 2021-03-17 LAB — HEPATIC FUNCTION PANEL
ALT: 48 U/L — ABNORMAL HIGH (ref 0–44)
AST: 37 U/L (ref 15–41)
Albumin: 2.4 g/dL — ABNORMAL LOW (ref 3.5–5.0)
Alkaline Phosphatase: 375 U/L — ABNORMAL HIGH (ref 38–126)
Bilirubin, Direct: <0.1 mg/dL (ref 0.0–0.2)
Total Bilirubin: 0.7 mg/dL (ref 0.3–1.2)
Total Protein: 4.6 g/dL — ABNORMAL LOW (ref 6.5–8.1)

## 2021-03-17 LAB — GLUCOSE, CAPILLARY
Glucose-Capillary: 186 mg/dL — ABNORMAL HIGH (ref 70–99)
Glucose-Capillary: 182 mg/dL — ABNORMAL HIGH (ref 70–99)
Glucose-Capillary: 147 mg/dL — ABNORMAL HIGH (ref 70–99)
Glucose-Capillary: 131 mg/dL — ABNORMAL HIGH (ref 70–99)
Glucose-Capillary: 150 mg/dL — ABNORMAL HIGH (ref 70–99)

## 2021-03-17 LAB — BASIC METABOLIC PANEL
Anion gap: 5 (ref 5–15)
BUN: 84 mg/dL — ABNORMAL HIGH (ref 6–20)
CO2: 28 mmol/L (ref 22–32)
Calcium: 7.8 mg/dL — ABNORMAL LOW (ref 8.9–10.3)
Chloride: 111 mmol/L (ref 98–111)
Creatinine, Ser: 3.86 mg/dL — ABNORMAL HIGH (ref 0.61–1.24)
GFR, Estimated: 17 mL/min — ABNORMAL LOW (ref >60)
Glucose, Bld: 184 mg/dL — ABNORMAL HIGH (ref 70–99)
Potassium: 4.9 mmol/L (ref 3.5–5.1)
Sodium: 144 mmol/L (ref 135–145)

## 2021-03-17 LAB — HEMOGLOBIN A1C
Hgb A1c MFr Bld: 5.6 % (ref 4.8–5.6)
Mean Plasma Glucose: 114 mg/dL

## 2021-03-17 MED ORDER — FUROSEMIDE 10 MG/ML IJ SOLN
80.0000 mg | Freq: Two times a day (BID) | INTRAMUSCULAR | Status: DC
  Administered 2021-03-17 – 2021-03-19 (×4): 80 mg via INTRAVENOUS
  Filled 2021-03-17 (×4): qty 8

## 2021-03-17 MED ORDER — CARVEDILOL 12.5 MG PO TABS
12.5000 mg | ORAL_TABLET | Freq: Two times a day (BID) | ORAL | Status: DC
  Administered 2021-03-17 – 2021-03-19 (×4): 12.5 mg via ORAL
  Filled 2021-03-17 (×4): qty 1

## 2021-03-17 NOTE — Progress Notes (Signed)
PROGRESS NOTE    Nathan [name]  [id number] DOB: 23-Dec-1960 DOA: 03/12/2021 PCP: Derinda Late, MD    Brief Narrative:  Nathan [name] is a 60 y.o. male with medical history significant for hypertension, hyperlipidemia, paf on eliquis, CKD4, combined heart failure, medication noncompliance presents to the emergency department for chief concern of shortness of breath.  Dr. Kurtis Bushman week: Patient initially placed on Lasix drip.  Cardiology and nephrology following.  Then switched to IV Lasix 3 times daily.  His volume status improving but still very volume overloaded. Also had 13 beats of VT on telemetry.  Creatinine is worsening. Need to follow-up with consultants. Has also been having diarrhea which is chronic for him.  GI panel negative.  Unable to order C. difficile yet.  COVID-negative    Consultants:  Cardiology, nephrology  Procedures:   Antimicrobials:      Subjective: No shortness of breath, chest pain.   Objective: Vitals:   03/16/21 1931 03/17/21 0332 03/17/21 0500 03/17/21 0753  BP: 139/72 (!) 163/73  (!) 172/77  Pulse: 69 68  66  Resp: '16 16  19  '$ Temp: 98.2 F (36.8 C) 98.6 F (37 C)  98.1 F (36.7 C)  TempSrc:      SpO2: 96% 94%  97%  Weight:   85.3 kg   Height:        Intake/Output Summary (Last 24 hours) at 03/17/2021 0818 Last data filed at 03/16/2021 1920 Gross per 24 hour  Intake 360 ml  Output 575 ml  Net -215 ml   Filed Weights   03/15/21 0500 03/16/21 0500 03/17/21 0500  Weight: 85 kg 87.8 kg 85.3 kg    Examination: Calm, NAD Positive JVD Decreased breath sounds at bases, no wheezing Regular S1-S2 no gallops Soft benign positive bowel sounds 2+ pitting edema AAA times O x4, grossly intact   Data Reviewed: I have personally reviewed following labs and imaging studies  CBC: Recent Labs  Lab 03/12/21 0751 03/13/21 0427  WBC 8.4 7.8  HGB 9.6* 9.3*  HCT 28.3* 27.5*  MCV 94.6 93.5  PLT 385 [id number]   Basic Metabolic  Panel: Recent Labs  Lab 03/13/21 0427 03/14/21 0418 03/15/21 0348 03/16/21 0735 03/17/21 0449  NA 146* 144 143 144 144  K 3.1* 3.7 4.5 5.0 4.9  CL 114* 110 111 111 111  CO2 '26 30 27 26 28  '$ GLUCOSE 140* 213* 121* 121* 184*  BUN 62* 64* 74* 74* 84*  CREATININE 3.21* 3.31* 3.62* 3.59* 3.86*  CALCIUM 7.8* 7.7* 7.6* 8.1* 7.8*  MG  --   --  2.3  --   --    GFR: Estimated Creatinine Clearance: 22.6 mL/min (A) (by C-G formula based on SCr of 3.86 mg/dL (H)). Liver Function Tests: Recent Labs  Lab 03/17/21 0449  AST 37  ALT 48*  ALKPHOS 375*  BILITOT 0.7  PROT 4.6*  ALBUMIN 2.4*   No results for input(s): LIPASE, AMYLASE in the last 168 hours. No results for input(s): AMMONIA in the last 168 hours. Coagulation Profile: No results for input(s): INR, PROTIME in the last 168 hours. Cardiac Enzymes: No results for input(s): CKTOTAL, CKMB, CKMBINDEX, TROPONINI in the last 168 hours. BNP (last 3 results) No results for input(s): PROBNP in the last 8760 hours. HbA1C: Recent Labs    03/16/21 0735  HGBA1C 5.6   CBG: Recent Labs  Lab 03/16/21 0749 03/16/21 1133 03/16/21 1733 03/16/21 2136 03/17/21 0754  GLUCAP 126* 172* 193* 230* 186*  Lipid Profile: No results for input(s): CHOL, HDL, LDLCALC, TRIG, CHOLHDL, LDLDIRECT in the last 72 hours. Thyroid Function Tests: No results for input(s): TSH, T4TOTAL, FREET4, T3FREE, THYROIDAB in the last 72 hours. Anemia Panel: No results for input(s): VITAMINB12, FOLATE, FERRITIN, TIBC, IRON, RETICCTPCT in the last 72 hours.  Sepsis Labs: No results for input(s): PROCALCITON, LATICACIDVEN in the last 168 hours.  Recent Results (from the past 240 hour(s))  Resp Panel by RT-PCR (Flu A&B, Covid) Nasopharyngeal Swab     Status: None   Collection Time: 03/12/21 10:47 AM   Specimen: Nasopharyngeal Swab; Nasopharyngeal(NP) swabs in vial transport medium  Result Value Ref Range Status   SARS Coronavirus 2 by RT PCR NEGATIVE NEGATIVE  Final    Comment: (NOTE) SARS-CoV-2 target nucleic acids are NOT DETECTED.  The SARS-CoV-2 RNA is generally detectable in upper respiratory specimens during the acute phase of infection. The lowest concentration of SARS-CoV-2 viral copies this assay can detect is 138 copies/mL. A negative result does not preclude SARS-Cov-2 infection and should not be used as the sole basis for treatment or other patient management decisions. A negative result may occur with  improper specimen collection/handling, submission of specimen other than nasopharyngeal swab, presence of viral mutation(s) within the areas targeted by this assay, and inadequate number of viral copies(<138 copies/mL). A negative result must be combined with clinical observations, patient history, and epidemiological information. The expected result is Negative.  Fact Sheet for Patients:  EntrepreneurPulse.com.au  Fact Sheet for Healthcare Providers:  IncredibleEmployment.be  This test is no t yet approved or cleared by the [place] FDA and  has been authorized for detection and/or diagnosis of SARS-CoV-2 by FDA under an Emergency Use Authorization (EUA). This EUA will remain  in effect (meaning this test can be used) for the duration of the COVID-19 declaration under Section 564(b)(1) of the Act, 21 U.S.C.section 360bbb-3(b)(1), unless the authorization is terminated  or revoked sooner.       Influenza A by PCR NEGATIVE NEGATIVE Final   Influenza B by PCR NEGATIVE NEGATIVE Final    Comment: (NOTE) The Xpert Xpress SARS-CoV-2/FLU/RSV plus assay is intended as an aid in the diagnosis of influenza from Nasopharyngeal swab specimens and should not be used as a sole basis for treatment. Nasal washings and aspirates are unacceptable for Xpert Xpress SARS-CoV-2/FLU/RSV testing.  Fact Sheet for Patients: EntrepreneurPulse.com.au  Fact Sheet for Healthcare  Providers: IncredibleEmployment.be  This test is not yet approved or cleared by the [place] FDA and has been authorized for detection and/or diagnosis of SARS-CoV-2 by FDA under an Emergency Use Authorization (EUA). This EUA will remain in effect (meaning this test can be used) for the duration of the COVID-19 declaration under Section 564(b)(1) of the Act, 21 U.S.C. section 360bbb-3(b)(1), unless the authorization is terminated or revoked.  Performed at [facility], [address] [place], [place] [address]   Gastrointestinal Panel by PCR , Stool     Status: None   Collection Time: 03/16/21  9:59 AM   Specimen: Stool  Result Value Ref Range Status   Campylobacter species NOT DETECTED NOT DETECTED Final   Plesimonas shigelloides NOT DETECTED NOT DETECTED Final   Salmonella species NOT DETECTED NOT DETECTED Final   Yersinia enterocolitica NOT DETECTED NOT DETECTED Final   Vibrio species NOT DETECTED NOT DETECTED Final   Vibrio cholerae NOT DETECTED NOT DETECTED Final   Enteroaggregative E coli (EAEC) NOT DETECTED NOT DETECTED Final   Enteropathogenic E coli (EPEC)  NOT DETECTED NOT DETECTED Final   Enterotoxigenic E coli (ETEC) NOT DETECTED NOT DETECTED Final   Shiga like toxin producing E coli (STEC) NOT DETECTED NOT DETECTED Final   Shigella/Enteroinvasive E coli (EIEC) NOT DETECTED NOT DETECTED Final   Cryptosporidium NOT DETECTED NOT DETECTED Final   Cyclospora cayetanensis NOT DETECTED NOT DETECTED Final   Entamoeba histolytica NOT DETECTED NOT DETECTED Final   Giardia lamblia NOT DETECTED NOT DETECTED Final   Adenovirus F40/41 NOT DETECTED NOT DETECTED Final   Astrovirus NOT DETECTED NOT DETECTED Final   Norovirus GI/GII NOT DETECTED NOT DETECTED Final   Rotavirus A NOT DETECTED NOT DETECTED Final   Sapovirus (I, II, IV, and V) NOT DETECTED NOT DETECTED Final    Comment: Performed at [facility], [address]  [place], [place] [address]         Radiology Studies: No results found.      Scheduled Meds:  apixaban  5 mg Oral BID   aspirin EC  81 mg Oral Daily   atorvastatin  40 mg Oral QHS   carvedilol  25 mg Oral BID   feeding supplement (NEPRO CARB STEADY)  237 mL Oral BID BM   ferrous sulfate  325 mg Oral Q breakfast   furosemide  40 mg Intravenous TID   hydrALAZINE  50 mg Oral TID   insulin aspart  0-9 Units Subcutaneous TID WC   isosorbide mononitrate  30 mg Oral BID   metolazone  2.5 mg Oral BID   multivitamin with minerals  1 tablet Oral Daily   Continuous Infusions:    Assessment & Plan:   Principal Problem:   Acute exacerbation of CHF (congestive heart failure) (HCC) Active Problems:   Essential hypertension   Anemia of chronic disease   Acute kidney injury superimposed on CKD (HCC)   PAF (paroxysmal atrial fibrillation) (HCC)   Proteinuria   Hyperlipidemia   Malnutrition of moderate degree   # Shortness of breath-due to acute on chronic combined heart failure and CKD stage IV Medical noncompliance BNP very elevated on admission  See/28 -improving slowly.  Was switched from Lasix drip to IV 40 mg 3 times daily.>>>  Still overloaded Cardiology and nephrology following Plan Lasix was changed to 80 mg twice daily IV Continue with metolazone 1L fluid restriction    # Acute kidney injury on CKD stage IV-secondary to cardiorenal  Nephrology following 6/28 creatinine worsening.  Creatinine 3.86 Continue IV Lasix as above Monitor levels Avoid nephrotoxic meds    # Hypokalemia- Replaced and stable continue to monitor   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-in acute exacerbation  - Echo on 09/02/2020 showed ejection fraction 30 to 35%, left ventricular global hypokinesis, grade 2 diastolic dysfunction, right ventricle systolic function is moderately decreased, left and right atrial moderately dilated 6/28- still volume overloaded,  cardiology offered RHC to further estimate volume status and assist with diuresis with patient refused Lasix changed to 80 mg IV twice daily Continue Coreg, hydralazine, Imdur Will need to follow-up with CHF clinic on discharge Strict I's and O's and daily weight Advance consistency precludes addition of ACE/ARB/ARNI/MRA/SLGT2i Continue Coreg, hydralazine, Imdur Will need to follow-up with CHF clinic on discharge    #13 beat run of VT Cardiology following Mag and potassium normal Continue beta-blockers 6/27 per cardiology assess cardiomyopathy remains of uncertain etiology would recommend ischemic evaluation as outpatient and follow-up once he is adequately diuresed, and impending renal function.  Currently he is not a  candidate for coronary CTA or diagnostic cardiac cath given his AKI.  Best option likely Lexiscan MPI   #Hypertension -improving with diuresis  Continue current management    #diarrhea-GI panel negative Appears to be chronic per patient as he had diarrhea at home He reports the food here is causing this for him  # Hyperlipidemia-continue statins # Paroxysmal atrial fibrillation-continue Eliquis # Hyperparathyroid secondary to chronic kidney disease # Anemia of chronic kidney disease-at baseline      DVT prophylaxis: Eliquis Code Status: Full Family Communication: None at bedside Disposition Plan:  Status is: Inpatient  Patient is inpatient as he requires hospitalization due to the severity of his illness and requiring IV medications  Dispo: The patient is from: Home              Anticipated d/c is to: Home              Patient currently is not medically stable to d/c.   Difficult to place patient No            LOS: 4 days   Time spent: 35 minutes with more than 50% on [place], MD [facility] Pager [phone]  If 7PM-7AM, please contact night-coverage 03/17/2021, 8:18 AM

## 2021-03-17 NOTE — Plan of Care (Signed)

## 2021-03-17 NOTE — Progress Notes (Signed)
Progress Note  Patient Name: Nathan [name] Date of Encounter: 03/17/2021  Primary Cardiologist: Rockey Situ  Subjective   He feels his lower extremity swelling is improving with the addition of TED hose, though continues to persist. No chest pain or dyspnea. Documented UOP ~ 200 mL for the past 24 hours with a net - 1.1 L for the admission. Weight down 2.5 kg over the past 24 hours. Renal function slightly worse over the past 24 hours. Notes back pain with laying in the bed.    Inpatient Medications    Scheduled Meds:  apixaban  5 mg Oral BID   aspirin EC  81 mg Oral Daily   atorvastatin  40 mg Oral QHS   carvedilol  25 mg Oral BID   feeding supplement (NEPRO CARB STEADY)  237 mL Oral BID BM   ferrous sulfate  325 mg Oral Q breakfast   furosemide  40 mg Intravenous TID   hydrALAZINE  50 mg Oral TID   insulin aspart  0-9 Units Subcutaneous TID WC   isosorbide mononitrate  30 mg Oral BID   metolazone  2.5 mg Oral BID   multivitamin with minerals  1 tablet Oral Daily   Continuous Infusions:  PRN Meds: ondansetron **OR** ondansetron (ZOFRAN) IV   Vital Signs    Vitals:   03/16/21 1922 03/16/21 1931 03/17/21 0332 03/17/21 0500  BP: (!) 145/62 139/72 (!) 163/73   Pulse: 71 69 68   Resp: '18 16 16   '$ Temp: 98.4 F (36.9 C) 98.2 F (36.8 C) 98.6 F (37 C)   TempSrc: Oral     SpO2: 98% 96% 94%   Weight:    85.3 kg  Height:        Intake/Output Summary (Last 24 hours) at 03/17/2021 0739 Last data filed at 03/16/2021 1920 Gross per 24 hour  Intake 360 ml  Output 575 ml  Net -215 ml    Filed Weights   03/15/21 0500 03/16/21 0500 03/17/21 0500  Weight: 85 kg 87.8 kg 85.3 kg    Telemetry    SR - Personally Reviewed  ECG    No new tracings - Personally Reviewed  Physical Exam   GEN: No acute distress.   Neck: JVD mildly elevated. Cardiac: RRR, no murmurs, rubs, or gallops.  Respiratory: Faint bibasilar crackles.  GI: Soft, nontender, non-distended.   MS:  1+ bilateral lower extremity pitting edema to the thighs with TED hose in place; No deformity. Neuro:  Alert and oriented x 3; Nonfocal.  Psych: Normal affect.  Labs    Chemistry Recent Labs  Lab 03/15/21 0348 03/16/21 0735 03/17/21 0449  NA 143 144 144  K 4.5 5.0 4.9  CL 111 111 111  CO2 '27 26 28  '$ GLUCOSE 121* 121* 184*  BUN 74* 74* 84*  CREATININE 3.62* 3.59* 3.86*  CALCIUM 7.6* 8.1* 7.8*  PROT  --   --  4.6*  ALBUMIN  --   --  2.4*  AST  --   --  37  ALT  --   --  48*  ALKPHOS  --   --  375*  BILITOT  --   --  0.7  GFRNONAA 19* 19* 17*  ANIONGAP '5 7 5      '$ Hematology Recent Labs  Lab 03/12/21 0751 03/13/21 0427  WBC 8.4 7.8  RBC 2.99* 2.94*  HGB 9.6* 9.3*  HCT 28.3* 27.5*  MCV 94.6 93.5  MCH 32.1 31.6  MCHC 33.9 33.8  RDW 13.0 13.0  PLT 385 320     Cardiac EnzymesNo results for input(s): TROPONINI in the last 168 hours. No results for input(s): TROPIPOC in the last 168 hours.   BNP Recent Labs  Lab 03/12/21 0751  BNP 2,701.7*      DDimer No results for input(s): DDIMER in the last 168 hours.   Radiology    CXR 03/12/2021: IMPRESSION: 1. Cardiomegaly with bilateral interstitial prominence. Interstitial prominence consistent interstitial edema and or pneumonitis.   2. Left base atelectasis and consolidation. Left base pneumonia cannot be excluded. Small left pleural effusion.  Cardiac Studies   2D echo 09/02/2020: 1. Left ventricular ejection fraction, by estimation, is 30 to 35%. The  left ventricle has moderately decreased function. The left ventricle  demonstrates global hypokinesis. There is mild left ventricular  hypertrophy. Left ventricular diastolic  parameters are consistent with Grade II diastolic dysfunction  (pseudonormalization). Elevated left atrial pressure. There is the  interventricular septum is flattened in systole and diastole, consistent  with right ventricular pressure and volume overload.   2. Right ventricular  systolic function is moderately reduced. The right  ventricular size is mildly enlarged. There is severely elevated pulmonary  artery systolic pressure.   3. Left atrial size was mildly dilated.   4. Right atrial size was mildly dilated.   5. A small pericardial effusion is present. The pericardial effusion is  posterior to the left ventricle.   6. The mitral valve is normal in structure. Mild mitral valve  regurgitation. No evidence of mitral stenosis.   7. Tricuspid valve regurgitation is moderate to severe.   8. The aortic valve is tricuspid. Aortic valve regurgitation is not  visualized. No aortic stenosis is present.   9. The inferior vena cava is dilated in size with <50% respiratory  variability, suggesting right atrial pressure of 15 mmHg. __________  2D echo 03/14/2021: 1. Left ventricular ejection fraction, by estimation, is 40 to 45%. The  left ventricle has mildly decreased function. The left ventricle  demonstrates global hypokinesis. There is mild left ventricular  hypertrophy. Left ventricular diastolic parameters  are consistent with Grade II diastolic dysfunction (pseudonormalization).   2. Right ventricular systolic function is low normal. The right  ventricular size is normal.   3. Left atrial size was mildly dilated.   4. A small pericardial effusion is present.   5. The mitral valve is normal in structure. Trivial mitral valve  regurgitation.   6. The aortic valve is tricuspid. Aortic valve regurgitation is not  visualized.   7. The inferior vena cava is dilated in size with <50% respiratory  variability, suggesting right atrial pressure of 15 mmHg.  Patient Profile     60 y.o. male with history of chronic combined systolic and diastolic CHF with cardiomyopathy of uncertain etiology, PAF nonadherent to Eliquis, left hydropneumothorax status post VATS in 06/2019, IDDM, CKD stage III, MRSA bacteremia, anemia of chronic disease, HTN, tobacco use, recurrent  admissions with medication nonadherence and hypokalemia who is being seen today for the evaluation of volume overload in the context of medication nonadherence at the request of Dr. Kurtis Bushman.  Assessment & Plan    1. Acute on chronic combined systolic and diastolic CHF/cardiomyopathy of uncertain etiology: -Exacerbated by running out of carvedilol and torsemide approximately 1 week prior to his presentation -Diuresis per nephrology -He declines RHC to further estimate volume status to assist with diuresis -Consider escalation of IV Lasix, will discuss with nephrology and IM -If augmentation of  diuresis is needed could consider transfer to the ICU with initiation of milrinone, though with bed availability, this may be difficult  -Echo this admission shows a slightly improved LVSF -Coreg -Advanced kidney disease precludes addition of ACEi/ARB/ARNI/MRA/SLGT2i -TED hose -Get out of bed -Daily weights -Strict I/O, possibly inaccurate    2.  Acute on CKD stage III with hypokalemia: -Management per nephrology, appreciate their assistance -Previously referred to nephrology though was a no-show   3.  PAF: -Maintaining sinus rhythm -CHA2DS2-VASc at least 4 (CHF, HTN, DM, vascular disease) -Reports he is not taking Eliquis at home secondary to renal dysfunction -Compliance with [place] is recommended, if he has concerns with [facility] he can discuss this with his primary cardiologist -Currently receiving Eliquis 5 mg twice daily as he does not meet reduced dosing criteria   4.  Coronary artery calcifications with elevated high-sensitivity troponin: -No symptoms of angina or dyspnea this admission -Minimally elevated and flat trending high-sensitivity troponin peaking at 81, not consistent with ACS -Echo as above -ASA -Lipitor -As his cardiomyopathy remains of uncertain etiology, would recommend ischemic evaluation as an outpatient in follow-up once he is adequately diuresed, and pending renal function  trend; as he is not currently a candidate for coronary CTA or diagnostic cardiac cath given his acute on CKD our best option would likely be a Lexiscan MPI   5.  Anemia of chronic disease: -Stable -Monitor   6.  HTN: -Mildly elevated this morning  -Diuresis as above -Carvedilol, hydralazine, isosorbide -Low-sodium diet  7. NSVT: -No further ventricular ectopy on telemetry -Coreg -Potassium high normal -Magnesium at goal  8. HLD: -Last LDL 92 in 10/2020 -Lipitor  9. Hypoalbuminemia: -Contributing to 3rd spacing    For questions or updates, please contact [place] Please consult [email or web address] for contact info under Cardiology/STEMI.    Signed, Christell Faith, PA-C [place] Pager: [phone] 03/17/2021, 7:39 AM

## 2021-03-17 NOTE — Progress Notes (Signed)
[facility]  ROUNDING NOTE   Subjective:   [name] was admitted to [facility] on 03/12/2021 for Anasarca [R60.1] Acute exacerbation of CHF (congestive heart failure) ([place]) [I50.9] Stage 4 chronic kidney disease ([place]) [N18.4]  Patient was out of his diuretics for approximately one week. He states prior to this he has been gaining weight and retaining fluid. He has kept a fluid restriction and salt restriction.   Patient feels like he back at his baseline IV Lasix States he is urinating External catheter placed  Objective:  Vital signs in last 24 hours:  Temp:  [97.8 F (36.6 C)-98.6 F (37 C)] 98 F (36.7 C) (06/28 1138) Pulse Rate:  [66-71] 66 (06/28 1138) Resp:  [16-19] 18 (06/28 1138) BP: (134-172)/(62-77) 169/76 (06/28 1138) SpO2:  [94 %-98 %] 97 % (06/28 1138) Weight:  [85.3 kg] 85.3 kg (06/28 0500)  Weight change: -2.5 kg Filed Weights   03/15/21 0500 03/16/21 0500 03/17/21 0500  Weight: 85 kg 87.8 kg 85.3 kg    Intake/Output: I/O last 3 completed shifts: In: 360 [P.O.:360] Out: 575 [Urine:575]   Intake/Output this shift:  Total I/O In: 960 [P.O.:960] Out: 600 [Urine:600]  Physical Exam: General: NAD, resting in bed  Head: Normocephalic, atraumatic. Moist oral mucosal membranes  Eyes: Anicteric  Lungs:  Clear to auscultation  Heart: Regular rate and rhythm  Abdomen:  Soft, nontender, +abdominal wall edema  Extremities: ++ peripheral edema.  Neurologic: Nonfocal, moving all four extremities  Skin: No lesions  GU External catheter    Basic Metabolic Panel: Recent Labs  Lab 03/13/21 0427 03/14/21 0418 03/15/21 0348 03/16/21 0735 03/17/21 0449  NA 146* 144 143 144 144  K 3.1* 3.7 4.5 5.0 4.9  CL 114* 110 111 111 111  CO2 '26 30 27 26 28  '$ GLUCOSE 140* 213* 121* 121* 184*  BUN 62* 64* 74* 74* 84*  CREATININE 3.21* 3.31* 3.62* 3.59* 3.86*  CALCIUM 7.8* 7.7* 7.6* 8.1* 7.8*  MG  --   --  2.3  --   --      Liver Function  Tests: Recent Labs  Lab 03/17/21 0449  AST 37  ALT 48*  ALKPHOS 375*  BILITOT 0.7  PROT 4.6*  ALBUMIN 2.4*   No results for input(s): LIPASE, AMYLASE in the last 168 hours. No results for input(s): AMMONIA in the last 168 hours.  CBC: Recent Labs  Lab 03/12/21 0751 03/13/21 0427  WBC 8.4 7.8  HGB 9.6* 9.3*  HCT 28.3* 27.5*  MCV 94.6 93.5  PLT 385 320     Cardiac Enzymes: No results for input(s): CKTOTAL, CKMB, CKMBINDEX, TROPONINI in the last 168 hours.  BNP: Invalid input(s): POCBNP  CBG: Recent Labs  Lab 03/16/21 1133 03/16/21 1733 03/16/21 2136 03/17/21 0754 03/17/21 1135  GLUCAP 172* 193* 230* 186* 182*     Microbiology: Results for orders placed or performed during the hospital encounter of 03/12/21  Resp Panel by RT-PCR (Flu A&B, Covid) Nasopharyngeal Swab     Status: None   Collection Time: 03/12/21 10:47 AM   Specimen: Nasopharyngeal Swab; Nasopharyngeal(NP) swabs in vial transport medium  Result Value Ref Range Status   SARS Coronavirus 2 by RT PCR NEGATIVE NEGATIVE Final    Comment: (NOTE) SARS-CoV-2 target nucleic acids are NOT DETECTED.  The SARS-CoV-2 RNA is generally detectable in upper respiratory specimens during the acute phase of infection. The lowest concentration of SARS-CoV-2 viral copies this assay can detect is 138 copies/mL. A negative result  does not preclude SARS-Cov-2 infection and should not be used as the sole basis for treatment or other patient management decisions. A negative result may occur with  improper specimen collection/handling, submission of specimen other than nasopharyngeal swab, presence of viral mutation(s) within the areas targeted by this assay, and inadequate number of viral copies(<138 copies/mL). A negative result must be combined with clinical observations, patient history, and epidemiological information. The expected result is Negative.  Fact Sheet for Patients:   EntrepreneurPulse.com.au  Fact Sheet for Healthcare Providers:  IncredibleEmployment.be  This test is no t yet approved or cleared by the [place] FDA and  has been authorized for detection and/or diagnosis of SARS-CoV-2 by FDA under an Emergency Use Authorization (EUA). This EUA will remain  in effect (meaning this test can be used) for the duration of the COVID-19 declaration under Section 564(b)(1) of the Act, 21 U.S.C.section 360bbb-3(b)(1), unless the authorization is terminated  or revoked sooner.       Influenza A by PCR NEGATIVE NEGATIVE Final   Influenza B by PCR NEGATIVE NEGATIVE Final    Comment: (NOTE) The Xpert Xpress SARS-CoV-2/FLU/RSV plus assay is intended as an aid in the diagnosis of influenza from Nasopharyngeal swab specimens and should not be used as a sole basis for treatment. Nasal washings and aspirates are unacceptable for Xpert Xpress SARS-CoV-2/FLU/RSV testing.  Fact Sheet for Patients: EntrepreneurPulse.com.au  Fact Sheet for Healthcare Providers: IncredibleEmployment.be  This test is not yet approved or cleared by the [place] FDA and has been authorized for detection and/or diagnosis of SARS-CoV-2 by FDA under an Emergency Use Authorization (EUA). This EUA will remain in effect (meaning this test can be used) for the duration of the COVID-19 declaration under Section 564(b)(1) of the Act, 21 U.S.C. section 360bbb-3(b)(1), unless the authorization is terminated or revoked.  Performed at [facility], [address] [place], [place] [address]   Gastrointestinal Panel by PCR , Stool     Status: None   Collection Time: 03/16/21  9:59 AM   Specimen: Stool  Result Value Ref Range Status   Campylobacter species NOT DETECTED NOT DETECTED Final   Plesimonas shigelloides NOT DETECTED NOT DETECTED Final   Salmonella species NOT DETECTED NOT DETECTED Final    Yersinia enterocolitica NOT DETECTED NOT DETECTED Final   Vibrio species NOT DETECTED NOT DETECTED Final   Vibrio cholerae NOT DETECTED NOT DETECTED Final   Enteroaggregative E coli (EAEC) NOT DETECTED NOT DETECTED Final   Enteropathogenic E coli (EPEC) NOT DETECTED NOT DETECTED Final   Enterotoxigenic E coli (ETEC) NOT DETECTED NOT DETECTED Final   Shiga like toxin producing E coli (STEC) NOT DETECTED NOT DETECTED Final   Shigella/Enteroinvasive E coli (EIEC) NOT DETECTED NOT DETECTED Final   Cryptosporidium NOT DETECTED NOT DETECTED Final   Cyclospora cayetanensis NOT DETECTED NOT DETECTED Final   Entamoeba histolytica NOT DETECTED NOT DETECTED Final   Giardia lamblia NOT DETECTED NOT DETECTED Final   Adenovirus F40/41 NOT DETECTED NOT DETECTED Final   Astrovirus NOT DETECTED NOT DETECTED Final   Norovirus GI/GII NOT DETECTED NOT DETECTED Final   Rotavirus A NOT DETECTED NOT DETECTED Final   Sapovirus (I, II, IV, and V) NOT DETECTED NOT DETECTED Final    Comment: Performed at [facility], [address] [place], [place] [address]    Coagulation Studies: No results for input(s): LABPROT, INR in the last 72 hours.  Urinalysis: No results for input(s): COLORURINE, LABSPEC, [place], [place], [place], [employer], [place], [place],  UROBILINOGEN, NITRITE, LEUKOCYTESUR in the last 72 hours.  Invalid input(s): APPERANCEUR    Imaging: No results found.   Medications:      apixaban  5 mg Oral BID   aspirin EC  81 mg Oral Daily   atorvastatin  40 mg Oral QHS   carvedilol  25 mg Oral BID   feeding supplement (NEPRO CARB STEADY)  237 mL Oral BID BM   ferrous sulfate  325 mg Oral Q breakfast   furosemide  40 mg Intravenous TID   hydrALAZINE  50 mg Oral TID   insulin aspart  0-9 Units Subcutaneous TID WC   isosorbide mononitrate  30 mg Oral BID   metolazone  2.5 mg Oral BID   multivitamin with minerals  1 tablet Oral Daily   ondansetron **OR** ondansetron (ZOFRAN)  IV  Assessment/ Plan:  [name] is a 59 y.o. white male hypertension, hyperlipidemia, congestive heart failure, diabetes mellitus type II, atrial fibrillation, COPD who is admitted to [facility] on 03/12/2021 for Anasarca [R60.1] Acute exacerbation of CHF (congestive heart failure) ([place]) [I50.9] Stage 4 chronic kidney disease ([place]) [N18.4]  Acute kidney injury on chronic kidney disease stage IV: with proteinuria. Baseline creatinine of 2.72, GFR of 25 on 11/05/2020. Followed by [facility], Dr. Lanora Manis. Chronic kidney disease secondary to diabetes and hypertension. Acute kidney injury secondary to acute cardiorenal syndrome. Creatinine currently 3.86. will monitor with addition of IV Lasix  Hypertension: elevated 169/76. Volume driven Home regimen of torsemide, carvedilol, isosorbide mononitrate and hydralazine. BP improved from previous readings. IV lasix '40mg'$  TID. Metolazone 2.'5mg'$  BID ordered. Will monitor UOP and edema.    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 IV Lasix as prescribed above Low salt diet. Fluid restriction. Daily standing weights  Anemia with chronic kidney disease: hemoglobin 9.3, normocytic. History of iron deficiency.    LOS: 4 M[place] Ahart 6/28/202212:39 PM

## 2021-03-18 LAB — GLUCOSE, CAPILLARY
Glucose-Capillary: 127 mg/dL — ABNORMAL HIGH (ref 70–99)
Glucose-Capillary: 106 mg/dL — ABNORMAL HIGH (ref 70–99)
Glucose-Capillary: 183 mg/dL — ABNORMAL HIGH (ref 70–99)
Glucose-Capillary: 198 mg/dL — ABNORMAL HIGH (ref 70–99)

## 2021-03-18 LAB — CBC
HCT: 26.5 % — ABNORMAL LOW (ref 39.0–52.0)
Hemoglobin: 8.5 g/dL — ABNORMAL LOW (ref 13.0–17.0)
MCH: 31.3 pg (ref 26.0–34.0)
MCHC: 32.1 g/dL (ref 30.0–36.0)
MCV: 97.4 fL (ref 80.0–100.0)
Platelets: 277 10*3/uL (ref 150–400)
RBC: 2.72 MIL/uL — ABNORMAL LOW (ref 4.22–5.81)
RDW: 13.1 % (ref 11.5–15.5)
WBC: 7.2 10*3/uL (ref 4.0–10.5)
nRBC: 0 % (ref 0.0–0.2)

## 2021-03-18 LAB — BASIC METABOLIC PANEL
Anion gap: 8 (ref 5–15)
BUN: 86 mg/dL — ABNORMAL HIGH (ref 6–20)
CO2: 27 mmol/L (ref 22–32)
Calcium: 8.1 mg/dL — ABNORMAL LOW (ref 8.9–10.3)
Chloride: 108 mmol/L (ref 98–111)
Creatinine, Ser: 3.86 mg/dL — ABNORMAL HIGH (ref 0.61–1.24)
GFR, Estimated: 17 mL/min — ABNORMAL LOW (ref >60)
Glucose, Bld: 145 mg/dL — ABNORMAL HIGH (ref 70–99)
Potassium: 4.6 mmol/L (ref 3.5–5.1)
Sodium: 143 mmol/L (ref 135–145)

## 2021-03-18 MED ORDER — HYDRALAZINE HCL 50 MG PO TABS
100.0000 mg | ORAL_TABLET | Freq: Three times a day (TID) | ORAL | Status: DC
  Administered 2021-03-18 – 2021-03-19 (×4): 100 mg via ORAL
  Filled 2021-03-18 (×4): qty 2

## 2021-03-18 NOTE — Progress Notes (Signed)
[facility] NAME: Nathan [name]    MR#:  [id number]  DATE OF BIRTH:  21-Apr-1961  SUBJECTIVE:   patient came in with increasing shortness of breath. Feels a whole lot better. Still has some leg edema. He is anxious to go home. Sitting out in the chair worked with physical therapy did very well.  REVIEW OF SYSTEMS:   Review of Systems  Constitutional:  Negative for chills, fever and weight loss.  HENT:  Negative for ear discharge, ear pain and nosebleeds.   Eyes:  Negative for blurred vision, pain and discharge.  Respiratory:  Positive for shortness of breath. Negative for sputum production, wheezing and stridor.   Cardiovascular:  Positive for leg swelling. Negative for chest pain, palpitations, orthopnea and PND.  Gastrointestinal:  Negative for abdominal pain, diarrhea, nausea and vomiting.  Genitourinary:  Negative for frequency and urgency.  Musculoskeletal:  Negative for back pain and joint pain.  Neurological:  Positive for weakness. Negative for sensory change, speech change and focal weakness.  Psychiatric/Behavioral:  Negative for depression and hallucinations. The patient is not nervous/anxious.   Tolerating Diet:yes Tolerating PT: no PT needs  DRUG ALLERGIES:  No Known Allergies  VITALS:  Blood pressure (!) 145/72, pulse (!) 56, temperature 97.7 F (36.5 C), resp. rate 18, height '5\' 9"'$  (1.753 m), weight 83.3 kg, SpO2 98 %.  PHYSICAL EXAMINATION:   Physical Exam  GENERAL:  60 y.o.-year-old patient lying in the bed with no acute distress.  LUNGS: Normal breath sounds bilaterally, no wheezing, rales, rhonchi. No use of accessory muscles of respiration.  CARDIOVASCULAR: S1, S2 normal. No murmurs, rubs, or gallops.  ABDOMEN: Soft, nontender, nondistended. Bowel sounds present. No organomegaly or mass.  EXTREMITIES: ++ edema b/l.    NEUROLOGIC: Cranial nerves II through XII are intact. No focal Motor or sensory  deficits b/l.   PSYCHIATRIC:  patient is alert and oriented x 3.  SKIN: No obvious rash, lesion, or ulcer.   LABORATORY PANEL:  CBC Recent Labs  Lab 03/18/21 0443  WBC 7.2  HGB 8.5*  HCT 26.5*  PLT 277    Chemistries  Recent Labs  Lab 03/15/21 0348 03/16/21 0735 03/17/21 0449 03/18/21 0443  NA 143   < > 144 143  K 4.5   < > 4.9 4.6  CL 111   < > 111 108  CO2 27   < > 28 27  GLUCOSE 121*   < > 184* 145*  BUN 74*   < > 84* 86*  CREATININE 3.62*   < > 3.86* 3.86*  CALCIUM 7.6*   < > 7.8* 8.1*  MG 2.3  --   --   --   AST  --   --  37  --   ALT  --   --  48*  --   ALKPHOS  --   --  375*  --   BILITOT  --   --  0.7  --    < > = values in this interval not displayed.   Cardiac Enzymes No results for input(s): TROPONINI in the last 168 hours. RADIOLOGY:  No results found. ASSESSMENT AND PLAN:   [name] is a 60 y.o. male with medical history significant for hypertension, hyperlipidemia, paf on eliquis, CKD4, combined heart failure, medication noncompliance presents to the emergency department for chief concern of shortness of breath.  Acute on chronic combined heart failure (systolic/diastolic) medical noncompliance CKD stage  IV -- patient was on IV Lasix now change to 80 mg BID along with metolazone -- good urine output. -- continue Coreg, hydralazine, indoor -- followed by Dr. Candiss Norse. -- appreciate [employer] MG input -- patient has declined ischemic workup. He'll follow-up outpatient  acute on chronic kidney injury stage IV secondary to cardio renal syndrome -- continue IV Lasix BID and metolazone -- creatinine stable at 3.8 -- avoid nephrotoxic agents  diarrhea appears chronic -- G.I. panel negative  Hyperlipidemia -- continue statins  history of paroxysmal a fib -- on Coreg and eliquis  anemia of chronic disease -- at baseline  Procedures: none Family communication : patient says he will inform his wife Consults : nephrology, cardiology CODE  STATUS: full DVT Prophylaxis : eliquis Level of care: Med-Surg Status is: Inpatient  Remains inpatient appropriate because:Inpatient level of care appropriate due to severity of illness  Dispo: The patient is from: Home              Anticipated d/c is to: Home              Patient currently is not medically stable to d/c.   Difficult to place patient No  patient continues to require IV diuresis. Per cardiology will need to stay at least for 1 to 2 days. Overall improving      TOTAL TIME TAKING CARE OF THIS PATIENT: 25 minutes.  >50% time spent on counselling and coordination of care  Note: This dictation was prepared with Dragon dictation along with smaller phrase technology. Any transcriptional errors that result from this process are unintentional.  Fritzi Mandes M.D    [facility]   CC: Primary care physician; Derinda Late, MDPatient ID: Nathan [name], male   DOB: 1961-06-12, 60 y.o.   MRN: [id number]

## 2021-03-18 NOTE — Progress Notes (Signed)
[facility]  ROUNDING NOTE   Subjective:   [name] was admitted to [facility] on 03/12/2021 for Anasarca [R60.1] Acute exacerbation of CHF (congestive heart failure) ([place]) [I50.9] Stage 4 chronic kidney disease ([place]) [N18.4]  Patient was out of his diuretics for approximately one week. He states prior to this he has been gaining weight and retaining fluid. He has kept a fluid restriction and salt restriction.   Patient seen resting in bed Alert and oriented UOP 2.6L via external catheter Weight 83.3 kg Tolerating meals, denies shortness of breath   Objective:  Vital signs in last 24 hours:  Temp:  [97.7 F (36.5 C)-98.4 F (36.9 C)] 97.7 F (36.5 C) (06/29 1126) Pulse Rate:  [56-66] 56 (06/29 1126) Resp:  [14-18] 18 (06/29 1126) BP: (145-176)/(69-81) 145/72 (06/29 1126) SpO2:  [97 %-98 %] 98 % (06/29 1126) Weight:  [83.3 kg] 83.3 kg (06/29 0500)  Weight change: -2 kg Filed Weights   03/16/21 0500 03/17/21 0500 03/18/21 0500  Weight: 87.8 kg 85.3 kg 83.3 kg    Intake/Output: I/O last 3 completed shifts: In: 1440 [P.O.:1440] Out: 2975 [Urine:2975]   Intake/Output this shift:  Total I/O In: 240 [P.O.:240] Out: 700 [Urine:700]  Physical Exam: General: NAD, resting in bed  Head: Normocephalic, atraumatic. Moist oral mucosal membranes  Eyes: Anicteric  Lungs:  Clear to auscultation  Heart: Regular rate and rhythm  Abdomen:  Soft, nontender, +abdominal wall edema (improving)  Extremities: ++ peripheral edema.(Improving)  Neurologic: Nonfocal, moving all four extremities  Skin: No lesions  GU External catheter    Basic Metabolic Panel: Recent Labs  Lab 03/14/21 0418 03/15/21 0348 03/16/21 0735 03/17/21 0449 03/18/21 0443  NA 144 143 144 144 143  K 3.7 4.5 5.0 4.9 4.6  CL 110 111 111 111 108  CO2 '30 27 26 28 27  '$ GLUCOSE 213* 121* 121* 184* 145*  BUN 64* 74* 74* 84* 86*  CREATININE 3.31* 3.62* 3.59* 3.86* 3.86*  CALCIUM 7.7* 7.6*  8.1* 7.8* 8.1*  MG  --  2.3  --   --   --      Liver Function Tests: Recent Labs  Lab 03/17/21 0449  AST 37  ALT 48*  ALKPHOS 375*  BILITOT 0.7  PROT 4.6*  ALBUMIN 2.4*    No results for input(s): LIPASE, AMYLASE in the last 168 hours. No results for input(s): AMMONIA in the last 168 hours.  CBC: Recent Labs  Lab 03/12/21 0751 03/13/21 0427 03/18/21 0443  WBC 8.4 7.8 7.2  HGB 9.6* 9.3* 8.5*  HCT 28.3* 27.5* 26.5*  MCV 94.6 93.5 97.4  PLT 385 320 277     Cardiac Enzymes: No results for input(s): CKTOTAL, CKMB, CKMBINDEX, TROPONINI in the last 168 hours.  BNP: Invalid input(s): POCBNP  CBG: Recent Labs  Lab 03/17/21 1538 03/17/21 1641 03/17/21 1953 03/18/21 0847 03/18/21 1127  GLUCAP 147* 131* 150* 127* 106*     Microbiology: Results for orders placed or performed during the hospital encounter of 03/12/21  Resp Panel by RT-PCR (Flu A&B, Covid) Nasopharyngeal Swab     Status: None   Collection Time: 03/12/21 10:47 AM   Specimen: Nasopharyngeal Swab; Nasopharyngeal(NP) swabs in vial transport medium  Result Value Ref Range Status   SARS Coronavirus 2 by RT PCR NEGATIVE NEGATIVE Final    Comment: (NOTE) SARS-CoV-2 target nucleic acids are NOT DETECTED.  The SARS-CoV-2 RNA is generally detectable in upper respiratory specimens during the acute phase of infection. The lowest  concentration of SARS-CoV-2 viral copies this assay can detect is 138 copies/mL. A negative result does not preclude SARS-Cov-2 infection and should not be used as the sole basis for treatment or other patient management decisions. A negative result may occur with  improper specimen collection/handling, submission of specimen other than nasopharyngeal swab, presence of viral mutation(s) within the areas targeted by this assay, and inadequate number of viral copies(<138 copies/mL). A negative result must be combined with clinical observations, patient history, and  epidemiological information. The expected result is Negative.  Fact Sheet for Patients:  EntrepreneurPulse.com.au  Fact Sheet for Healthcare Providers:  IncredibleEmployment.be  This test is no t yet approved or cleared by the [place] FDA and  has been authorized for detection and/or diagnosis of SARS-CoV-2 by FDA under an Emergency Use Authorization (EUA). This EUA will remain  in effect (meaning this test can be used) for the duration of the COVID-19 declaration under Section 564(b)(1) of the Act, 21 U.S.C.section 360bbb-3(b)(1), unless the authorization is terminated  or revoked sooner.       Influenza A by PCR NEGATIVE NEGATIVE Final   Influenza B by PCR NEGATIVE NEGATIVE Final    Comment: (NOTE) The Xpert Xpress SARS-CoV-2/FLU/RSV plus assay is intended as an aid in the diagnosis of influenza from Nasopharyngeal swab specimens and should not be used as a sole basis for treatment. Nasal washings and aspirates are unacceptable for Xpert Xpress SARS-CoV-2/FLU/RSV testing.  Fact Sheet for Patients: EntrepreneurPulse.com.au  Fact Sheet for Healthcare Providers: IncredibleEmployment.be  This test is not yet approved or cleared by the [place] FDA and has been authorized for detection and/or diagnosis of SARS-CoV-2 by FDA under an Emergency Use Authorization (EUA). This EUA will remain in effect (meaning this test can be used) for the duration of the COVID-19 declaration under Section 564(b)(1) of the Act, 21 U.S.C. section 360bbb-3(b)(1), unless the authorization is terminated or revoked.  Performed at [facility], [address] [place], [place] [address]   Gastrointestinal Panel by PCR , Stool     Status: None   Collection Time: 03/16/21  9:59 AM   Specimen: Stool  Result Value Ref Range Status   Campylobacter species NOT DETECTED NOT DETECTED Final   Plesimonas shigelloides  NOT DETECTED NOT DETECTED Final   Salmonella species NOT DETECTED NOT DETECTED Final   Yersinia enterocolitica NOT DETECTED NOT DETECTED Final   Vibrio species NOT DETECTED NOT DETECTED Final   Vibrio cholerae NOT DETECTED NOT DETECTED Final   Enteroaggregative E coli (EAEC) NOT DETECTED NOT DETECTED Final   Enteropathogenic E coli (EPEC) NOT DETECTED NOT DETECTED Final   Enterotoxigenic E coli (ETEC) NOT DETECTED NOT DETECTED Final   Shiga like toxin producing E coli (STEC) NOT DETECTED NOT DETECTED Final   Shigella/Enteroinvasive E coli (EIEC) NOT DETECTED NOT DETECTED Final   Cryptosporidium NOT DETECTED NOT DETECTED Final   Cyclospora cayetanensis NOT DETECTED NOT DETECTED Final   Entamoeba histolytica NOT DETECTED NOT DETECTED Final   Giardia lamblia NOT DETECTED NOT DETECTED Final   Adenovirus F40/41 NOT DETECTED NOT DETECTED Final   Astrovirus NOT DETECTED NOT DETECTED Final   Norovirus GI/GII NOT DETECTED NOT DETECTED Final   Rotavirus A NOT DETECTED NOT DETECTED Final   Sapovirus (I, II, IV, and V) NOT DETECTED NOT DETECTED Final    Comment: Performed at [facility], [address] [place], [place] [address]    Coagulation Studies: No results for input(s): LABPROT, INR in the last 72  hours.  Urinalysis: No results for input(s): COLORURINE, LABSPEC, PHURINE, GLUCOSEU, HGBUR, BILIRUBINUR, KETONESUR, PROTEINUR, UROBILINOGEN, NITRITE, LEUKOCYTESUR in the last 72 hours.  Invalid input(s): APPERANCEUR    Imaging: No results found.   Medications:      apixaban  5 mg Oral BID   aspirin EC  81 mg Oral Daily   atorvastatin  40 mg Oral QHS   carvedilol  12.5 mg Oral BID   feeding supplement (NEPRO CARB STEADY)  237 mL Oral BID BM   ferrous sulfate  325 mg Oral Q breakfast   furosemide  80 mg Intravenous BID   hydrALAZINE  100 mg Oral TID   insulin aspart  0-9 Units Subcutaneous TID WC   isosorbide mononitrate  30 mg Oral BID   metolazone  2.5 mg Oral BID    multivitamin with minerals  1 tablet Oral Daily   ondansetron **OR** ondansetron (ZOFRAN) IV  Assessment/ Plan:  [name] is a 60 y.o. white male hypertension, hyperlipidemia, congestive heart failure, diabetes mellitus type II, atrial fibrillation, COPD who is admitted to [facility] on 03/12/2021 for Anasarca [R60.1] Acute exacerbation of CHF (congestive heart failure) ([place]) [I50.9] Stage 4 chronic kidney disease ([place]) [N18.4]  Acute kidney injury on chronic kidney disease stage IV: with proteinuria. Baseline creatinine of 2.72, GFR of 25 on 11/05/2020. Followed by [facility], Dr. Lanora Manis. Chronic kidney disease secondary to diabetes and hypertension. Acute kidney injury secondary to acute cardiorenal syndrome. Creatinine remains 3.86. will monitor with continued IV Lasix. May transition to oral medications tomorrow.   Hypertension: elevated 145/72. Volume driven Home regimen of torsemide, carvedilol, isosorbide mononitrate and hydralazine. BP improved from previous readings. IV lasix '40mg'$  TID. Metolazone 2.'5mg'$  BID ordered.    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 IV Lasix as prescribed above Low salt diet. Fluid restriction. Daily standing weights and UOP indicate desired results from treatment.   Anemia with chronic kidney disease: hemoglobin 8.5, normocytic. History of iron deficiency.    LOS: 5 Nathan [name] 6/29/202212:40 PM

## 2021-03-18 NOTE — Progress Notes (Addendum)
Progress Note  Patient Name: Nathan [name] Date of Encounter: 03/18/2021  Primary Cardiologist: Rockey Situ  Subjective   No chest pain or dyspnea. Lower extremity swelling continues to improve with TED hose and escalation of diuresis.   With titration of IV Lasix to 80 mg bid and continuation of metolazone 2.5 mg bid on 6/28, documented UOP 1.2 L over the past 24 hours with a net - 2.4 L for the admission.  Weight down 2 kg over the past 24 hours. Renal function above baseline, though stable with escalation with diuresis. BP remains elevated. Anxious to get back home. Has not been out of bed.   Inpatient Medications    Scheduled Meds:  apixaban  5 mg Oral BID   aspirin EC  81 mg Oral Daily   atorvastatin  40 mg Oral QHS   carvedilol  12.5 mg Oral BID   feeding supplement (NEPRO CARB STEADY)  237 mL Oral BID BM   ferrous sulfate  325 mg Oral Q breakfast   furosemide  80 mg Intravenous BID   hydrALAZINE  50 mg Oral TID   insulin aspart  0-9 Units Subcutaneous TID WC   isosorbide mononitrate  30 mg Oral BID   metolazone  2.5 mg Oral BID   multivitamin with minerals  1 tablet Oral Daily   Continuous Infusions:  PRN Meds: ondansetron **OR** ondansetron (ZOFRAN) IV   Vital Signs    Vitals:   03/17/21 1537 03/17/21 1954 03/18/21 0438 03/18/21 0500  BP: (!) 148/69 (!) 152/72 (!) 175/81   Pulse: 66 66 62   Resp: '18 14 14   '$ Temp: 98.3 F (36.8 C) 98.4 F (36.9 C) 98 F (36.7 C)   TempSrc:      SpO2: 97% 98% 98%   Weight:    83.3 kg  Height:        Intake/Output Summary (Last 24 hours) at 03/18/2021 0809 Last data filed at 03/18/2021 0500 Gross per 24 hour  Intake 1320 ml  Output 2600 ml  Net -1280 ml    Filed Weights   03/16/21 0500 03/17/21 0500 03/18/21 0500  Weight: 87.8 kg 85.3 kg 83.3 kg    Telemetry    SR - Personally Reviewed  ECG    No new tracings - Personally Reviewed  Physical Exam   GEN: No acute distress.   Neck: JVD mildly  elevated. Cardiac: RRR, no murmurs, rubs, or gallops.  Respiratory: Faint bibasilar crackles.  GI: Soft, nontender, non-distended.   MS: Trace bilateral lower extremity pitting edema to the thighs with TED hose in place; No deformity. Neuro:  Alert and oriented x 3; Nonfocal.  Psych: Normal affect.  Labs    Chemistry Recent Labs  Lab 03/16/21 0735 03/17/21 0449 03/18/21 0443  NA 144 144 143  K 5.0 4.9 4.6  CL 111 111 108  CO2 '26 28 27  '$ GLUCOSE 121* 184* 145*  BUN 74* 84* 86*  CREATININE 3.59* 3.86* 3.86*  CALCIUM 8.1* 7.8* 8.1*  PROT  --  4.6*  --   ALBUMIN  --  2.4*  --   AST  --  37  --   ALT  --  48*  --   ALKPHOS  --  375*  --   BILITOT  --  0.7  --   GFRNONAA 19* 17* 17*  ANIONGAP '7 5 8      '$ Hematology Recent Labs  Lab 03/12/21 0751 03/13/21 0427 03/18/21 0443  WBC 8.4 7.8  7.2  RBC 2.99* 2.94* 2.72*  HGB 9.6* 9.3* 8.5*  HCT 28.3* 27.5* 26.5*  MCV 94.6 93.5 97.4  MCH 32.1 31.6 31.3  MCHC 33.9 33.8 32.1  RDW 13.0 13.0 13.1  PLT 385 320 277     Cardiac EnzymesNo results for input(s): TROPONINI in the last 168 hours. No results for input(s): TROPIPOC in the last 168 hours.   BNP Recent Labs  Lab 03/12/21 0751  BNP 2,701.7*      DDimer No results for input(s): DDIMER in the last 168 hours.   Radiology    CXR 03/12/2021: IMPRESSION: 1. Cardiomegaly with bilateral interstitial prominence. Interstitial prominence consistent interstitial edema and or pneumonitis.   2. Left base atelectasis and consolidation. Left base pneumonia cannot be excluded. Small left pleural effusion.  Cardiac Studies   2D echo 09/02/2020: 1. Left ventricular ejection fraction, by estimation, is 30 to 35%. The  left ventricle has moderately decreased function. The left ventricle  demonstrates global hypokinesis. There is mild left ventricular  hypertrophy. Left ventricular diastolic  parameters are consistent with Grade II diastolic dysfunction   (pseudonormalization). Elevated left atrial pressure. There is the  interventricular septum is flattened in systole and diastole, consistent  with right ventricular pressure and volume overload.   2. Right ventricular systolic function is moderately reduced. The right  ventricular size is mildly enlarged. There is severely elevated pulmonary  artery systolic pressure.   3. Left atrial size was mildly dilated.   4. Right atrial size was mildly dilated.   5. A small pericardial effusion is present. The pericardial effusion is  posterior to the left ventricle.   6. The mitral valve is normal in structure. Mild mitral valve  regurgitation. No evidence of mitral stenosis.   7. Tricuspid valve regurgitation is moderate to severe.   8. The aortic valve is tricuspid. Aortic valve regurgitation is not  visualized. No aortic stenosis is present.   9. The inferior vena cava is dilated in size with <50% respiratory  variability, suggesting right atrial pressure of 15 mmHg. __________  2D echo 03/14/2021: 1. Left ventricular ejection fraction, by estimation, is 40 to 45%. The  left ventricle has mildly decreased function. The left ventricle  demonstrates global hypokinesis. There is mild left ventricular  hypertrophy. Left ventricular diastolic parameters  are consistent with Grade II diastolic dysfunction (pseudonormalization).   2. Right ventricular systolic function is low normal. The right  ventricular size is normal.   3. Left atrial size was mildly dilated.   4. A small pericardial effusion is present.   5. The mitral valve is normal in structure. Trivial mitral valve  regurgitation.   6. The aortic valve is tricuspid. Aortic valve regurgitation is not  visualized.   7. The inferior vena cava is dilated in size with <50% respiratory  variability, suggesting right atrial pressure of 15 mmHg.  Patient Profile     60 y.o. male with history of chronic combined systolic and diastolic CHF  with cardiomyopathy of uncertain etiology, PAF nonadherent to Eliquis, left hydropneumothorax status post VATS in 06/2019, IDDM, CKD stage III, MRSA bacteremia, anemia of chronic disease, HTN, tobacco use, recurrent admissions with medication nonadherence and hypokalemia who is being seen today for the evaluation of volume overload in the context of medication nonadherence at the request of Dr. Kurtis Bushman.  Assessment & Plan    1. Acute on chronic combined systolic and diastolic CHF/cardiomyopathy of uncertain etiology: -Exacerbated by running out of carvedilol and torsemide approximately 1  week prior to his presentation -Nephrology assisting with diuresis, appreciate their assistance  -Continue IV Lasix 80 mg bid and metolazone 2.5 mg bid with stable renal function following escalation of diuresis on 6/28, reassess renal function and UOP 6/30 -He has declined RHC to further estimate volume status to assist with diuresis, revisit  -If augmentation of diuresis is needed could consider transfer to the ICU with initiation of milrinone, though with bed availability, this may be difficult  -Echo this admission shows a slightly improved LVSF -Coreg -Advanced kidney disease precludes addition of ACEi/ARB/ARNI/MRA/SLGT2i -TED hose -Get out of bed -Daily weights -Strict I/O, possibly inaccurate    2.  Acute on CKD stage III with hypokalemia: -Stable -Management per nephrology, appreciate their assistance -Previously referred to nephrology though was a no-show   3.  PAF: -Maintaining sinus rhythm -CHA2DS2-VASc at least 4 (CHF, HTN, DM, vascular disease) -Reports he is not taking Eliquis at home secondary to renal dysfunction -Compliance with [place] is recommended, if he has concerns with [facility] he can discuss this with his primary cardiologist -Currently receiving Eliquis 5 mg twice daily as he does not meet reduced dosing criteria   4.  Coronary artery calcifications with elevated high-sensitivity  troponin: -No symptoms of angina or dyspnea this admission -Minimally elevated and flat trending high-sensitivity troponin peaking at 81, not consistent with ACS -Echo as above -ASA -Lipitor -As his cardiomyopathy remains of uncertain etiology, would recommend ischemic evaluation as an outpatient in follow-up once he is adequately diuresed, and pending renal function trend; as he is not currently a candidate for coronary CTA or diagnostic cardiac cath given his acute on CKD our best option would likely be a Lexiscan MPI   5.  Anemia of chronic disease: -Stable -Monitor   6.  HTN: -Elevated this morning  -Diuresis as above -Continue titrated Coreg -Titrate hydralazine to 100 mg tid -Continue isosorbide -Low-sodium diet  7. NSVT: -No further ventricular ectopy on telemetry -Coreg -Potassium and magnesium at goal  8. HLD: -Last LDL 92 in 10/2020 -Lipitor  9. Hypoalbuminemia/deconditioning: -Contributing to 3rd spacing -Needs to ambulate  -Consider PT consult    For questions or updates, please contact [place] Please consult [email or web address] for contact info under Cardiology/STEMI.    Signed, Christell Faith, PA-C [place] Pager: [phone] 03/18/2021, 8:09 AM

## 2021-03-18 NOTE — Evaluation (Signed)
Physical Therapy Evaluation Patient Details Name: Nathan [name] MRN: [id number] DOB: 01/14/1961 Today's Date: 03/18/2021   History of Present Illness  60 y.o. male with medical history significant for hypertension, hyperlipidemia, paf on eliquis, CKD4, combined heart failure, medication noncompliance presents to the emergency department for chief concern of shortness of breath as well as LE swelling.  Pt has been on lasix in hospital and has gotten some fluid off.  Clinical Impression  Pt was able to ambulate around the nurses' station with walking stick, reports being near his baseline and overall feeling confident about being able to go home.  He does have chronic hip flexion weakness b/l.  Pt did not have excessive fatigue, had no LOBs.  Pt does have ~10 X 3" steps that we did not trial today.  Pt expected to d/c home tomorrow, pt eager to return home.    Follow Up Recommendations No PT follow up (pt not interested)    Equipment Recommendations  None recommended by PT    Recommendations for Other Services       Precautions / Restrictions Precautions Precautions: Fall Restrictions Weight Bearing Restrictions: No      Mobility  Bed Mobility Overal bed mobility: Modified Independent             General bed mobility comments: Pt needed to use UEs to get LEs toward EOB 2/2 hip flexion weakness    Transfers Overall transfer level: Modified independent Equipment used:  (walking stick)             General transfer comment: Pt was able to rise to standing with relative ease, reliant on UEs  Ambulation/Gait Ambulation/Gait assistance: Min guard Gait Distance (Feet): 200 Feet Assistive device:  (walking stick and occasional use of nurses' station)       General Gait Details: Pt with slow but consistent cadence.  Reports being near his baseline with intermittent cruising on furniture and using walking stick.  Overall pt did well, some fatigue with HR remaining in  the 60s and O2 dropping to ~90% on room air.  Stairs            Wheelchair Mobility    Modified Rankin (Stroke Patients Only)       Balance Overall balance assessment: Modified Independent                                           Pertinent Vitals/Pain Pain Assessment: 0-10 Pain Score: 3  Pain Location: c/o chronic hip (L>R), along with general soreness from laying in bed    Home Living Family/patient expects to be discharged to:: Private residence Living Arrangements: Spouse/significant other     Home Access: Stairs to enter Entrance Stairs-Rails: Left (house on other side) Entrance Stairs-Number of Steps: 10 small 3" steps   Home Equipment: Walker - 2 wheels (uses tall walking stick)      Prior Function Level of Independence: Independent with assistive device(s)         Comments: reports he mostly uses furniture, counters, etc at home     Hand Dominance        Extremity/Trunk Assessment   Upper Extremity Assessment Upper Extremity Assessment: Overall WFL for tasks assessed    Lower Extremity Assessment Lower Extremity Assessment: Overall WFL for tasks assessed;Generalized weakness (pain/weakness with hip flexion, grossly 4/5 otherwise)       Communication  Communication: No difficulties  Cognition Arousal/Alertness: Awake/alert Behavior During Therapy: WFL for tasks assessed/performed Overall Cognitive Status: Within Functional Limits for tasks assessed                                        General Comments      Exercises     Assessment/Plan    PT Assessment Patient needs continued PT services  PT Problem List Decreased strength;Decreased range of motion;Decreased balance;Decreased safety awareness;Decreased knowledge of use of DME;Decreased mobility       PT Treatment Interventions DME instruction;Gait training;Stair training;Functional mobility training;Therapeutic activities;Therapeutic  exercise;Balance training;Neuromuscular re-education;Patient/family education    PT Goals (Current goals can be found in the Care Plan section)  Acute Rehab PT Goals Patient Stated Goal: go home tomorrow PT Goal Formulation: With patient Time For Goal Achievement: 04/01/21    Frequency Min 2X/week   Barriers to discharge        Co-evaluation               AM-PAC PT "6 Clicks" Mobility  Outcome Measure Help needed turning from your back to your side while in a flat bed without using bedrails?: None Help needed moving from lying on your back to sitting on the side of a flat bed without using bedrails?: A Little Help needed moving to and from a bed to a chair (including a wheelchair)?: None Help needed standing up from a chair using your arms (e.g., wheelchair or bedside chair)?: None Help needed to walk in hospital room?: None Help needed climbing 3-5 steps with a railing? : A Little 6 Click Score: 22    End of Session Equipment Utilized During Treatment: Gait belt Activity Tolerance: Patient limited by fatigue Patient left: with chair alarm set;with call bell/phone within reach   PT Visit Diagnosis: Muscle weakness (generalized) (M62.81);Difficulty in walking, not elsewhere classified (R26.2)    Time: [id number] PT Time Calculation (min) (ACUTE ONLY): 25 min   Charges:   PT Evaluation $PT Eval Low Complexity: 1 Low PT Treatments $Gait Training: 8-22 mins        Kreg Shropshire, DPT 03/18/2021, 11:31 AM

## 2021-03-19 DIAGNOSIS — N179 Acute kidney failure, unspecified: Secondary | ICD-10-CM

## 2021-03-19 DIAGNOSIS — D638 Anemia in other chronic diseases classified elsewhere: Secondary | ICD-10-CM

## 2021-03-19 DIAGNOSIS — I5043 Acute on chronic combined systolic (congestive) and diastolic (congestive) heart failure: Secondary | ICD-10-CM

## 2021-03-19 DIAGNOSIS — N184 Chronic kidney disease, stage 4 (severe): Secondary | ICD-10-CM

## 2021-03-19 DIAGNOSIS — E440 Moderate protein-calorie malnutrition: Secondary | ICD-10-CM

## 2021-03-19 DIAGNOSIS — N189 Chronic kidney disease, unspecified: Secondary | ICD-10-CM

## 2021-03-19 DIAGNOSIS — R601 Generalized edema: Secondary | ICD-10-CM

## 2021-03-19 LAB — GLUCOSE, CAPILLARY
Glucose-Capillary: 154 mg/dL — ABNORMAL HIGH (ref 70–99)
Glucose-Capillary: 124 mg/dL — ABNORMAL HIGH (ref 70–99)

## 2021-03-19 MED ORDER — HYDRALAZINE HCL 50 MG PO TABS: 50.0000 mg | ORAL_TABLET | Freq: Three times a day (TID) | ORAL | Status: DC

## 2021-03-19 MED ORDER — METOLAZONE 2.5 MG PO TABS: 2.5000 mg | ORAL_TABLET | Freq: Two times a day (BID) | ORAL | 2 refills | Status: DC

## 2021-03-19 MED ORDER — APIXABAN 5 MG PO TABS: 5.0000 mg | ORAL_TABLET | Freq: Two times a day (BID) | ORAL | 2 refills | Status: DC

## 2021-03-19 MED ORDER — CARVEDILOL 25 MG PO TABS: 25.0000 mg | ORAL_TABLET | Freq: Two times a day (BID) | ORAL | 2 refills | Status: DC

## 2021-03-19 MED ORDER — FUROSEMIDE 80 MG PO TABS: 80.0000 mg | ORAL_TABLET | Freq: Two times a day (BID) | ORAL | 11 refills | Status: DC

## 2021-03-19 NOTE — Progress Notes (Signed)
Progress Note  Patient Name: Nathan [name] Date of Encounter: 03/19/2021  [facility] Cardiologist: Ida Rogue, MD   Subjective   Long discussion with him this morning, reports he is able to get around has been walking short distances.  Feels much better now than when he came in He does appreciate that he has leg edema still Has not been tracking his weights at home adjusting medications Some medication noncompliance Denies alcohol intake Reports that his wife is at home to help him Currently laying supine without discomfort Insisting on going home  Inpatient Medications    Scheduled Meds:  apixaban  5 mg Oral BID   aspirin EC  81 mg Oral Daily   atorvastatin  40 mg Oral QHS   carvedilol  12.5 mg Oral BID   feeding supplement (NEPRO CARB STEADY)  237 mL Oral BID BM   ferrous sulfate  325 mg Oral Q breakfast   furosemide  80 mg Intravenous BID   hydrALAZINE  100 mg Oral TID   insulin aspart  0-9 Units Subcutaneous TID WC   isosorbide mononitrate  30 mg Oral BID   metolazone  2.5 mg Oral BID   multivitamin with minerals  1 tablet Oral Daily   Continuous Infusions:  PRN Meds: ondansetron **OR** ondansetron (ZOFRAN) IV   Vital Signs    Vitals:   03/19/21 0405 03/19/21 0500 03/19/21 0808 03/19/21 1159  BP: (!) 153/75  (!) 151/70 (!) 142/72  Pulse: 65  67 63  Resp: '18  17 17  '$ Temp: 98.1 F (36.7 C)  98 F (36.7 C) 97.9 F (36.6 C)  TempSrc:      SpO2: 98%  96% 97%  Weight:  82.2 kg    Height:        Intake/Output Summary (Last 24 hours) at 03/19/2021 1346 Last data filed at 03/19/2021 1000 Gross per 24 hour  Intake 240 ml  Output 2950 ml  Net -2710 ml   Last 3 Weights 03/19/2021 03/18/2021 03/17/2021  Weight (lbs) 181 lb 4.8 oz 183 lb 10.3 oz 188 lb 0.8 oz  Weight (kg) 82.237 kg 83.3 kg 85.3 kg      Telemetry     NSR- Personally Reviewed  ECG     - Personally Reviewed  Physical Exam   GEN: No acute distress.   Neck: No JVD Cardiac:  RRR, no murmurs, rubs, or gallops.  Respiratory: Clear to auscultation bilaterally. GI: Soft, nontender, non-distended  MS: No edema; No deformity. Neuro:  Nonfocal  Psych: Normal affect   Labs    High Sensitivity Troponin:   Recent Labs  Lab 03/12/21 0751 03/12/21 1124  TROPONINIHS 80* 81*      Chemistry Recent Labs  Lab 03/16/21 0735 03/17/21 0449 03/18/21 0443  NA 144 144 143  K 5.0 4.9 4.6  CL 111 111 108  CO2 '26 28 27  '$ GLUCOSE 121* 184* 145*  BUN 74* 84* 86*  CREATININE 3.59* 3.86* 3.86*  CALCIUM 8.1* 7.8* 8.1*  PROT  --  4.6*  --   ALBUMIN  --  2.4*  --   AST  --  37  --   ALT  --  48*  --   ALKPHOS  --  375*  --   BILITOT  --  0.7  --   GFRNONAA 19* 17* 17*  ANIONGAP '7 5 8     '$ Hematology Recent Labs  Lab 03/13/21 0427 03/18/21 0443  WBC 7.8 7.2  RBC 2.94* 2.72*  HGB 9.3* 8.5*  HCT 27.5* 26.5*  MCV 93.5 97.4  MCH 31.6 31.3  MCHC 33.8 32.1  RDW 13.0 13.1  PLT 320 277    BNPNo results for input(s): BNP, PROBNP in the last 168 hours.   DDimer No results for input(s): DDIMER in the last 168 hours.   Radiology    No results found.  Cardiac Studies  Echocardiogram March 14, 2021 Ejection fraction 40 to [id number], grade 2 diastolic dysfunction, dilated IVC   Patient Profile     60 y.o. male with history of chronic combined systolic and diastolic CHF with cardiomyopathy of uncertain etiology, PAF nonadherent to Eliquis, left hydropneumothorax status post VATS in 06/2019, IDDM, CKD stage III, MRSA bacteremia, anemia of chronic disease, HTN, tobacco use, recurrent admissions with medication nonadherence and hypokalemia who is being seen today for the evaluation of volume overload in the context of medication nonadherence  Assessment & Plan    Acute on chronic diastolic and systolic CHF Prior history of medication noncompliance, reports running out of several of his medications for least 1 week -Severe leg edema, abdominal distention flank edema in  the setting of renal failure -Creatinine above his baseline, followed by nephrology -Previously declined catheterization including right heart cath -Unable to treat his heart failure with ACE inhibitor, ARB, Entresto given renal failure -Some of his edema could be exacerbated by underlying anemia -Without said he is likely not at his euvolemic state given significant flank edema, some abdominal distention, leg edema -He is insisting on going home reporting he has been here going on more than 7 days -Detailed to him that he is high risk of readmission, he is declining to stay for further diuresis -Nephrology has started him on Lasix 80 twice daily with metolazone 2.5 daily -Maintained on carvedilol, hydralazine, isosorbide  Paroxysmal atrial fibrillation -CHA2DS2-VASc at least 4 (CHF, HTN, DM, vascular disease) On carvedilol for rate and rhythm control, Eliquis 5 twice daily  Acute on chronic renal failure Prior no-show to nephrology clinic Unable to exclude component of cardiorenal syndrome Outlook poor, likely trending towards hemodialysis  Cardiomyopathy Medications as above including carvedilol hydralazine isosorbide Not a candidate for catheterization given renal failure Consider Myoview as outpatient  Long discussion with him concerning heart failure symptoms, stressed need to be compliant on his medications, discussed follow-up  Total encounter time more than 35 minutes  Greater than 50% was spent in counseling and coordination of care with the patient   For questions or updates, please contact [facility] Please consult [email or web address] for contact info under        Signed, Ida Rogue, MD  03/19/2021, 1:46 PM

## 2021-03-19 NOTE — Progress Notes (Signed)
[facility]  ROUNDING NOTE   Subjective:   [name] was admitted to [facility] on 03/12/2021 for Anasarca [R60.1] Acute exacerbation of CHF (congestive heart failure) ([place]) [I50.9] Stage 4 chronic kidney disease ([place]) [N18.4]  Patient was out of his diuretics for approximately one week. He states prior to this he has been gaining weight and retaining fluid. He has kept a fluid restriction and salt restriction.   Patient seen resting in bed Alert and oriented UOP 3L in last 24 hours Weight 82.2kg (83.3 kg) Tolerating meals Able to ambulate with PT yesterday   Objective:  Vital signs in last 24 hours:  Temp:  [97.6 F (36.4 C)-98.2 F (36.8 C)] 98 F (36.7 C) (06/30 0808) Pulse Rate:  [61-67] 67 (06/30 0808) Resp:  [17-18] 17 (06/30 0808) BP: (133-153)/(70-75) 151/70 (06/30 0808) SpO2:  [95 %-98 %] 96 % (06/30 0808) Weight:  [82.2 kg] 82.2 kg (06/30 0500)  Weight change: -1.063 kg Filed Weights   03/17/21 0500 03/18/21 0500 03/19/21 0500  Weight: 85.3 kg 83.3 kg 82.2 kg    Intake/Output: I/O last 3 completed shifts: In: 600 [P.O.:600] Out: 4400 [Urine:4400]   Intake/Output this shift:  Total I/O In: -  Out: 600 [Urine:600]  Physical Exam: General: NAD, resting in bed  Head: Normocephalic, atraumatic. Moist oral mucosal membranes  Eyes: Anicteric  Lungs:  Clear to auscultation  Heart: Regular rate and rhythm  Abdomen:  Soft, nontender, +abdominal wall edema  Extremities: ++ peripheral edema.(Improving)  Neurologic: Nonfocal, moving all four extremities  Skin: No lesions  GU External catheter    Basic Metabolic Panel: Recent Labs  Lab 03/14/21 0418 03/15/21 0348 03/16/21 0735 03/17/21 0449 03/18/21 0443  NA 144 143 144 144 143  K 3.7 4.5 5.0 4.9 4.6  CL 110 111 111 111 108  CO2 '30 27 26 28 27  '$ GLUCOSE 213* 121* 121* 184* 145*  BUN 64* 74* 74* 84* 86*  CREATININE 3.31* 3.62* 3.59* 3.86* 3.86*  CALCIUM 7.7* 7.6* 8.1* 7.8* 8.1*   MG  --  2.3  --   --   --      Liver Function Tests: Recent Labs  Lab 03/17/21 0449  AST 37  ALT 48*  ALKPHOS 375*  BILITOT 0.7  PROT 4.6*  ALBUMIN 2.4*    No results for input(s): LIPASE, AMYLASE in the last 168 hours. No results for input(s): AMMONIA in the last 168 hours.  CBC: Recent Labs  Lab 03/13/21 0427 03/18/21 0443  WBC 7.8 7.2  HGB 9.3* 8.5*  HCT 27.5* 26.5*  MCV 93.5 97.4  PLT 320 277     Cardiac Enzymes: No results for input(s): CKTOTAL, CKMB, CKMBINDEX, TROPONINI in the last 168 hours.  BNP: Invalid input(s): POCBNP  CBG: Recent Labs  Lab 03/18/21 0847 03/18/21 1127 03/18/21 1649 03/18/21 2112 03/19/21 0829  GLUCAP 127* 106* 183* 198* 154*     Microbiology: Results for orders placed or performed during the hospital encounter of 03/12/21  Resp Panel by RT-PCR (Flu A&B, Covid) Nasopharyngeal Swab     Status: None   Collection Time: 03/12/21 10:47 AM   Specimen: Nasopharyngeal Swab; Nasopharyngeal(NP) swabs in vial transport medium  Result Value Ref Range Status   SARS Coronavirus 2 by RT PCR NEGATIVE NEGATIVE Final    Comment: (NOTE) SARS-CoV-2 target nucleic acids are NOT DETECTED.  The SARS-CoV-2 RNA is generally detectable in upper respiratory specimens during the acute phase of infection. The lowest concentration of SARS-CoV-2 viral  copies this assay can detect is 138 copies/mL. A negative result does not preclude SARS-Cov-2 infection and should not be used as the sole basis for treatment or other patient management decisions. A negative result may occur with  improper specimen collection/handling, submission of specimen other than nasopharyngeal swab, presence of viral mutation(s) within the areas targeted by this assay, and inadequate number of viral copies(<138 copies/mL). A negative result must be combined with clinical observations, patient history, and epidemiological information. The expected result is Negative.  Fact  Sheet for Patients:  EntrepreneurPulse.com.au  Fact Sheet for Healthcare Providers:  IncredibleEmployment.be  This test is no t yet approved or cleared by the [place] FDA and  has been authorized for detection and/or diagnosis of SARS-CoV-2 by FDA under an Emergency Use Authorization (EUA). This EUA will remain  in effect (meaning this test can be used) for the duration of the COVID-19 declaration under Section 564(b)(1) of the Act, 21 U.S.C.section 360bbb-3(b)(1), unless the authorization is terminated  or revoked sooner.       Influenza A by PCR NEGATIVE NEGATIVE Final   Influenza B by PCR NEGATIVE NEGATIVE Final    Comment: (NOTE) The Xpert Xpress SARS-CoV-2/FLU/RSV plus assay is intended as an aid in the diagnosis of influenza from Nasopharyngeal swab specimens and should not be used as a sole basis for treatment. Nasal washings and aspirates are unacceptable for Xpert Xpress SARS-CoV-2/FLU/RSV testing.  Fact Sheet for Patients: EntrepreneurPulse.com.au  Fact Sheet for Healthcare Providers: IncredibleEmployment.be  This test is not yet approved or cleared by the [place] FDA and has been authorized for detection and/or diagnosis of SARS-CoV-2 by FDA under an Emergency Use Authorization (EUA). This EUA will remain in effect (meaning this test can be used) for the duration of the COVID-19 declaration under Section 564(b)(1) of the Act, 21 U.S.C. section 360bbb-3(b)(1), unless the authorization is terminated or revoked.  Performed at [facility], [address] [place], [place] [address]   Gastrointestinal Panel by PCR , Stool     Status: None   Collection Time: 03/16/21  9:59 AM   Specimen: Stool  Result Value Ref Range Status   Campylobacter species NOT DETECTED NOT DETECTED Final   Plesimonas shigelloides NOT DETECTED NOT DETECTED Final   Salmonella species NOT DETECTED NOT  DETECTED Final   Yersinia enterocolitica NOT DETECTED NOT DETECTED Final   Vibrio species NOT DETECTED NOT DETECTED Final   Vibrio cholerae NOT DETECTED NOT DETECTED Final   Enteroaggregative E coli (EAEC) NOT DETECTED NOT DETECTED Final   Enteropathogenic E coli (EPEC) NOT DETECTED NOT DETECTED Final   Enterotoxigenic E coli (ETEC) NOT DETECTED NOT DETECTED Final   Shiga like toxin producing E coli (STEC) NOT DETECTED NOT DETECTED Final   Shigella/Enteroinvasive E coli (EIEC) NOT DETECTED NOT DETECTED Final   Cryptosporidium NOT DETECTED NOT DETECTED Final   Cyclospora cayetanensis NOT DETECTED NOT DETECTED Final   Entamoeba histolytica NOT DETECTED NOT DETECTED Final   Giardia lamblia NOT DETECTED NOT DETECTED Final   Adenovirus F40/41 NOT DETECTED NOT DETECTED Final   Astrovirus NOT DETECTED NOT DETECTED Final   Norovirus GI/GII NOT DETECTED NOT DETECTED Final   Rotavirus A NOT DETECTED NOT DETECTED Final   Sapovirus (I, II, IV, and V) NOT DETECTED NOT DETECTED Final    Comment: Performed at [facility], [address] [place], [place] [address]    Coagulation Studies: No results for input(s): LABPROT, INR in the last 72 hours.  Urinalysis: No  results for input(s): COLORURINE, LABSPEC, [place], GLUCOSEU, HGBUR, BILIRUBINUR, KETONESUR, PROTEINUR, UROBILINOGEN, NITRITE, LEUKOCYTESUR in the last 72 hours.  Invalid input(s): APPERANCEUR    Imaging: No results found.   Medications:      apixaban  5 mg Oral BID   aspirin EC  81 mg Oral Daily   atorvastatin  40 mg Oral QHS   carvedilol  12.5 mg Oral BID   feeding supplement (NEPRO CARB STEADY)  237 mL Oral BID BM   ferrous sulfate  325 mg Oral Q breakfast   furosemide  80 mg Intravenous BID   hydrALAZINE  100 mg Oral TID   insulin aspart  0-9 Units Subcutaneous TID WC   isosorbide mononitrate  30 mg Oral BID   metolazone  2.5 mg Oral BID   multivitamin with minerals  1 tablet Oral Daily   ondansetron **OR**  ondansetron (ZOFRAN) IV  Assessment/ Plan:  [name] is a 60 y.o. white male hypertension, hyperlipidemia, congestive heart failure, diabetes mellitus type II, atrial fibrillation, COPD who is admitted to [facility] on 03/12/2021 for Anasarca [R60.1] Acute exacerbation of CHF (congestive heart failure) ([place]) [I50.9] Stage 4 chronic kidney disease ([place]) [N18.4]  Acute kidney injury on chronic kidney disease stage IV: with proteinuria. Baseline creatinine of 2.72, GFR of 25 on 11/05/2020. Followed by [facility], Dr. Lanora Manis. Chronic kidney disease secondary to diabetes and hypertension. Acute kidney injury secondary to acute cardiorenal syndrome. Creatinine remains 3.86. will monitor with continued IV Lasix while inpatient. At discharge, recommend Lasix '80mg'$  BID and Metolazone 2.5 mg daily.   Hypertension: elevated 151/70. Volume driven Home regimen of torsemide, carvedilol, isosorbide mononitrate and hydralazine. BP improved from previous readings. IV lasix '80mg'$  TID. Metolazone 2.'5mg'$  BID ordered.    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 IV Lasix as prescribed above Low salt diet. Fluid restriction. Daily standing weights and UOP indicate desired results from treatment.   Anemia with chronic kidney disease: hemoglobin 8.5, normocytic. History of iron deficiency.    LOS: 6 Batina Dougan 6/30/202211:47 AM

## 2021-03-19 NOTE — Discharge Summary (Signed)
[facility] NAME: Nathan [name]    MR#:  [id number]  DATE OF BIRTH:  1961/01/24  DATE OF ADMISSION:  03/12/2021 ADMITTING PHYSICIAN: Nolberto Hanlon, MD  DATE OF DISCHARGE: 03/19/2021  PRIMARY CARE PHYSICIAN: Derinda Late, MD    ADMISSION DIAGNOSIS:  Anasarca [R60.1] Acute exacerbation of CHF (congestive heart failure) ([facility]) [I50.9] Stage 4 chronic kidney disease ([place]) [N18.4]  DISCHARGE DIAGNOSIS:  acute on chronic exacerbation of CHF systolic/diastolic CKD stage IV  SECONDARY DIAGNOSIS:   Past Medical History:  Diagnosis Date  . Anemia of chronic disease   . Chronic combined systolic (congestive) and diastolic (congestive) heart failure ([place])    a. 05/2019 Echo: EF 45-50%, nl LA/RA size. Mild to mod TR.  . CKD (chronic kidney disease), stage III ([place])   . Diabetes mellitus without complication ([place])   . Hypertension   . Left Hydropneumothorax/Fibrinopurulent empyema    a. 06/2019 s/p VATS.  Marland Kitchen PAF (paroxysmal atrial fibrillation) ([place])    a. 05/2019 in setting of sepsis-->s/p TEE/DCCV; b. CHA2DS2VASc = 2-->Eliquis.  . Sepsis ([place])    a. 05/2019 MRSA in setting of infected R thumb.    HOSPITAL COURSE:  Nathan [name] is a 60 y.o. male with medical history significant for hypertension, hyperlipidemia, paf on eliquis, CKD4, combined heart failure, medication noncompliance presents to the emergency department for chief concern of shortness of breath.   Acute on chronic combined heart failure (systolic/diastolic) medical noncompliance CKD stage IV -- patient was on IV Lasix now change to 80 mg BID along with metolazone--change to po lasix 80 mg bid and metolazone 2.5 mg qd -- good urine output. -- continue Coreg, hydralazine, indoor -- followed by Dr. Merita Norton -- appreciate [facility] MG input -- patient has declined ischemic workup. He'll follow-up outpatient   acute on chronic kidney injury stage IV secondary to cardio renal  syndrome -- continue IV Lasix BID and metolazone--now on po -- creatinine stable at 3.8 -- avoid nephrotoxic agents   diarrhea appears chronic -- G.I. panel negative   Hyperlipidemia -- continue statins   history of paroxysmal a fib -- on Coreg and eliquis   anemia of chronic disease -- at baseline   Procedures: none Family communication : patient says he will inform his wife Consults : nephrology, cardiology CODE STATUS: full DVT Prophylaxis : eliquis Level of care: Med-Surg Status is: Inpatient     Dispo: The patient is from: Home              Anticipated d/c is to: Home              Patient currently is  medically improving.                 patient is advised to follow-up with cardiology, nephrology and make sure he does not run out of medication. He is at a high risk for readmission. CONSULTS OBTAINED:  Treatment Team:  Lavonia Dana, MD End, Harrell Gave, MD  DRUG ALLERGIES:  No Known Allergies  DISCHARGE MEDICATIONS:   Allergies as of 03/19/2021   No Known Allergies      Medication List     STOP taking these medications    torsemide 20 MG tablet Commonly known as: DEMADEX       TAKE these medications    acetaminophen 500 MG tablet Commonly known as: TYLENOL Take 500-1,000 mg by mouth every 6 (six) hours as needed for mild pain or moderate pain.  apixaban 5 MG Tabs tablet Commonly known as: Eliquis Take 1 tablet (5 mg total) by mouth 2 (two) times daily.   aspirin EC 81 MG tablet Take 81 mg by mouth daily.   atorvastatin 40 MG tablet Commonly known as: LIPITOR Take 40 mg by mouth daily.   carvedilol 25 MG tablet Commonly known as: COREG Take 1 tablet (25 mg total) by mouth 2 (two) times daily.   ferrous sulfate 325 (65 FE) MG tablet Take 1 tablet (325 mg total) by mouth daily with breakfast.   furosemide 80 MG tablet Commonly known as: Lasix Take 1 tablet (80 mg total) by mouth 2 (two) times daily.   glipiZIDE 2.5 MG 24 hr  tablet Commonly known as: GLUCOTROL XL Take 2.5 mg by mouth daily as needed (elevated blood glucose).   hydrALAZINE 50 MG tablet Commonly known as: APRESOLINE Take 1 tablet (50 mg total) by mouth 3 (three) times daily.   isosorbide mononitrate 30 MG 24 hr tablet Commonly known as: IMDUR Take 1 tablet (30 mg total) by mouth 2 (two) times daily.   metolazone 2.5 MG tablet Commonly known as: ZAROXOLYN Take 1 tablet (2.5 mg total) by mouth 2 (two) times daily.   multivitamin tablet Take 1 tablet by mouth daily.        If you experience worsening of your admission symptoms, develop shortness of breath, life threatening emergency, suicidal or homicidal thoughts you must seek medical attention immediately by calling 911 or calling your MD immediately  if symptoms less severe.  You Must read complete instructions/literature along with all the possible adverse reactions/side effects for all the Medicines you take and that have been prescribed to you. Take any new Medicines after you have completely understood and accept all the possible adverse reactions/side effects.   Please note  You were cared for by a hospitalist during your hospital stay. If you have any questions about your discharge medications or the care you received while you were in the hospital after you are discharged, you can call the unit and asked to speak with the hospitalist on call if the hospitalist that took care of you is not available. Once you are discharged, your primary care physician will handle any further medical issues. Please note that NO REFILLS for any discharge medications will be authorized once you are discharged, as it is imperative that you return to your primary care physician (or establish a relationship with a primary care physician if you do not have one) for your aftercare needs so that they can reassess your need for medications and monitor your lab values. Today   SUBJECTIVE   patient is  requesting to go home. Leg edema improving however does have some swelling  VITAL SIGNS:  Blood pressure (!) 142/72, pulse 63, temperature 97.9 F (36.6 C), resp. rate 17, height '5\' 9"'$  (1.753 m), weight 82.2 kg, SpO2 97 %.  I/O:   Intake/Output Summary (Last 24 hours) at 03/19/2021 1230 Last data filed at 03/19/2021 1000 Gross per 24 hour  Intake 240 ml  Output 2950 ml  Net -2710 ml    PHYSICAL EXAMINATION:  GENERAL:  60 y.o.-year-old patient lying in the bed with no acute distress.  LUNGS: Normal breath sounds bilaterally, no wheezing, rales,rhonchi or crepitation. No use of accessory muscles of respiration.  CARDIOVASCULAR: S1, S2 normal. No murmurs, rubs, or gallops.  ABDOMEN: Soft, non-tender, non-distended. Bowel sounds present. No organomegaly or mass.  EXTREMITIES: + pedal edema, no cyanosis, or clubbing.  NEUROLOGIC: non focal  PSYCHIATRIC: patient is alert and oriented x 3.  SKIN: No obvious rash, lesion, or ulcer.   DATA REVIEW:   CBC  Recent Labs  Lab 03/18/21 0443  WBC 7.2  HGB 8.5*  HCT 26.5*  PLT 277    Chemistries  Recent Labs  Lab 03/15/21 0348 03/16/21 0735 03/17/21 0449 03/18/21 0443  NA 143   < > 144 143  K 4.5   < > 4.9 4.6  CL 111   < > 111 108  CO2 27   < > 28 27  GLUCOSE 121*   < > 184* 145*  BUN 74*   < > 84* 86*  CREATININE 3.62*   < > 3.86* 3.86*  CALCIUM 7.6*   < > 7.8* 8.1*  MG 2.3  --   --   --   AST  --   --  37  --   ALT  --   --  48*  --   ALKPHOS  --   --  375*  --   BILITOT  --   --  0.7  --    < > = values in this interval not displayed.    Microbiology Results   Recent Results (from the past 240 hour(s))  Resp Panel by RT-PCR (Flu A&B, Covid) Nasopharyngeal Swab     Status: None   Collection Time: 03/12/21 10:47 AM   Specimen: Nasopharyngeal Swab; Nasopharyngeal(NP) swabs in vial transport medium  Result Value Ref Range Status   SARS Coronavirus 2 by RT PCR NEGATIVE NEGATIVE Final    Comment: (NOTE) SARS-CoV-2  target nucleic acids are NOT DETECTED.  The SARS-CoV-2 RNA is generally detectable in upper respiratory specimens during the acute phase of infection. The lowest concentration of SARS-CoV-2 viral copies this assay can detect is 138 copies/mL. A negative result does not preclude SARS-Cov-2 infection and should not be used as the sole basis for treatment or other patient management decisions. A negative result may occur with  improper specimen collection/handling, submission of specimen other than nasopharyngeal swab, presence of viral mutation(s) within the areas targeted by this assay, and inadequate number of viral copies(<138 copies/mL). A negative result must be combined with clinical observations, patient history, and epidemiological information. The expected result is Negative.  Fact Sheet for Patients:  EntrepreneurPulse.com.au  Fact Sheet for Healthcare Providers:  IncredibleEmployment.be  This test is no t yet approved or cleared by the [place] FDA and  has been authorized for detection and/or diagnosis of SARS-CoV-2 by FDA under an Emergency Use Authorization (EUA). This EUA will remain  in effect (meaning this test can be used) for the duration of the COVID-19 declaration under Section 564(b)(1) of the Act, 21 U.S.C.section 360bbb-3(b)(1), unless the authorization is terminated  or revoked sooner.       Influenza A by PCR NEGATIVE NEGATIVE Final   Influenza B by PCR NEGATIVE NEGATIVE Final    Comment: (NOTE) The Xpert Xpress SARS-CoV-2/FLU/RSV plus assay is intended as an aid in the diagnosis of influenza from Nasopharyngeal swab specimens and should not be used as a sole basis for treatment. Nasal washings and aspirates are unacceptable for Xpert Xpress SARS-CoV-2/FLU/RSV testing.  Fact Sheet for Patients: EntrepreneurPulse.com.au  Fact Sheet for Healthcare  Providers: IncredibleEmployment.be  This test is not yet approved or cleared by the [place] FDA and has been authorized for detection and/or diagnosis of SARS-CoV-2 by FDA under an Emergency Use Authorization (EUA). This EUA will remain in  effect (meaning this test can be used) for the duration of the COVID-19 declaration under Section 564(b)(1) of the Act, 21 U.S.C. section 360bbb-3(b)(1), unless the authorization is terminated or revoked.  Performed at [facility], [address] [place], [place] [address]   Gastrointestinal Panel by PCR , Stool     Status: None   Collection Time: 03/16/21  9:59 AM   Specimen: Stool  Result Value Ref Range Status   Campylobacter species NOT DETECTED NOT DETECTED Final   Plesimonas shigelloides NOT DETECTED NOT DETECTED Final   Salmonella species NOT DETECTED NOT DETECTED Final   Yersinia enterocolitica NOT DETECTED NOT DETECTED Final   Vibrio species NOT DETECTED NOT DETECTED Final   Vibrio cholerae NOT DETECTED NOT DETECTED Final   Enteroaggregative E coli (EAEC) NOT DETECTED NOT DETECTED Final   Enteropathogenic E coli (EPEC) NOT DETECTED NOT DETECTED Final   Enterotoxigenic E coli (ETEC) NOT DETECTED NOT DETECTED Final   Shiga like toxin producing E coli (STEC) NOT DETECTED NOT DETECTED Final   Shigella/Enteroinvasive E coli (EIEC) NOT DETECTED NOT DETECTED Final   Cryptosporidium NOT DETECTED NOT DETECTED Final   Cyclospora cayetanensis NOT DETECTED NOT DETECTED Final   Entamoeba histolytica NOT DETECTED NOT DETECTED Final   Giardia lamblia NOT DETECTED NOT DETECTED Final   Adenovirus F40/41 NOT DETECTED NOT DETECTED Final   Astrovirus NOT DETECTED NOT DETECTED Final   Norovirus GI/GII NOT DETECTED NOT DETECTED Final   Rotavirus A NOT DETECTED NOT DETECTED Final   Sapovirus (I, II, IV, and V) NOT DETECTED NOT DETECTED Final    Comment: Performed at [facility], [address]  [place], [place] [address]    RADIOLOGY:  No results found.   CODE STATUS:     Code Status Orders  (From admission, onward)           Start     Ordered   03/12/21 0938  Full code  Continuous        03/12/21 0939           Code Status History     Date Active Date Inactive Code Status Order ID Comments User Context   12/03/2020 1641 12/04/2020 1753 Full Code [id number]  Collier Bullock, MD ED   06/24/2019 1737 06/30/2019 2056 Full Code [id number]  Mckinley Jewel, MD Inpatient   06/19/2019 2234 06/24/2019 1618 Full Code [id number]  Nicholes Mango, MD Inpatient   06/10/2019 1736 06/16/2019 1553 Full Code [id number]  Otila Back, MD ED        TOTAL TIME TAKING CARE OF THIS PATIENT: 40 minutes.    Fritzi Mandes M.D  [facility]    CC: Primary care physician; Derinda Late, MD

## 2021-04-13 ENCOUNTER — Ambulatory Visit: Payer: Self-pay | Admitting: Family

## 2021-04-22 ENCOUNTER — Ambulatory Visit: Payer: Self-pay | Admitting: Cardiovascular Disease

## 2021-04-22 NOTE — Progress Notes (Deleted)
NO SHOW

## 2021-04-27 ENCOUNTER — Telehealth: Payer: Self-pay | Admitting: Cardiovascular Disease

## 2021-04-27 NOTE — Telephone Encounter (Signed)
Received records request from [facility] forwarded to [facility]

## 2021-09-03 ENCOUNTER — Encounter: Payer: Self-pay | Admitting: Emergency Medicine

## 2021-09-03 ENCOUNTER — Other Ambulatory Visit: Payer: Self-pay

## 2021-09-03 ENCOUNTER — Inpatient Hospital Stay: Payer: Medicaid Other

## 2021-09-03 ENCOUNTER — Inpatient Hospital Stay
Admission: EM | Admit: 2021-09-03 | Discharge: 2021-09-10 | DRG: 981 | Disposition: A | Discharge status: Home / Self Care | Payer: Medicaid Other | Attending: Internal Medicine | Admitting: Internal Medicine

## 2021-09-03 ENCOUNTER — Emergency Department: Payer: Medicaid Other

## 2021-09-03 DIAGNOSIS — N179 Acute kidney failure, unspecified: Secondary | ICD-10-CM | POA: Diagnosis present

## 2021-09-03 DIAGNOSIS — I482 Chronic atrial fibrillation, unspecified: Secondary | ICD-10-CM | POA: Diagnosis present

## 2021-09-03 DIAGNOSIS — N189 Chronic kidney disease, unspecified: Secondary | ICD-10-CM | POA: Diagnosis present

## 2021-09-03 DIAGNOSIS — N186 End stage renal disease: Secondary | ICD-10-CM | POA: Diagnosis present

## 2021-09-03 DIAGNOSIS — R739 Hyperglycemia, unspecified: Secondary | ICD-10-CM

## 2021-09-03 DIAGNOSIS — E1122 Type 2 diabetes mellitus with diabetic chronic kidney disease: Secondary | ICD-10-CM | POA: Diagnosis present

## 2021-09-03 DIAGNOSIS — L03116 Cellulitis of left lower limb: Principal | ICD-10-CM | POA: Diagnosis present

## 2021-09-03 DIAGNOSIS — I10 Essential (primary) hypertension: Secondary | ICD-10-CM | POA: Diagnosis present

## 2021-09-03 DIAGNOSIS — E872 Acidosis, unspecified: Secondary | ICD-10-CM | POA: Diagnosis not present

## 2021-09-03 DIAGNOSIS — Z833 Family history of diabetes mellitus: Secondary | ICD-10-CM | POA: Diagnosis not present

## 2021-09-03 DIAGNOSIS — R9389 Abnormal findings on diagnostic imaging of other specified body structures: Secondary | ICD-10-CM

## 2021-09-03 DIAGNOSIS — E785 Hyperlipidemia, unspecified: Secondary | ICD-10-CM | POA: Diagnosis present

## 2021-09-03 DIAGNOSIS — Z8249 Family history of ischemic heart disease and other diseases of the circulatory system: Secondary | ICD-10-CM | POA: Diagnosis not present

## 2021-09-03 DIAGNOSIS — I480 Paroxysmal atrial fibrillation: Secondary | ICD-10-CM

## 2021-09-03 DIAGNOSIS — Z79899 Other long term (current) drug therapy: Secondary | ICD-10-CM

## 2021-09-03 DIAGNOSIS — D638 Anemia in other chronic diseases classified elsewhere: Secondary | ICD-10-CM | POA: Diagnosis present

## 2021-09-03 DIAGNOSIS — L97929 Non-pressure chronic ulcer of unspecified part of left lower leg with unspecified severity: Secondary | ICD-10-CM | POA: Diagnosis present

## 2021-09-03 DIAGNOSIS — D62 Acute posthemorrhagic anemia: Secondary | ICD-10-CM | POA: Diagnosis present

## 2021-09-03 DIAGNOSIS — I5084 End stage heart failure: Secondary | ICD-10-CM | POA: Diagnosis present

## 2021-09-03 DIAGNOSIS — Z7901 Long term (current) use of anticoagulants: Secondary | ICD-10-CM

## 2021-09-03 DIAGNOSIS — Z20822 Contact with and (suspected) exposure to covid-19: Secondary | ICD-10-CM | POA: Diagnosis present

## 2021-09-03 DIAGNOSIS — E1141 Type 2 diabetes mellitus with diabetic mononeuropathy: Secondary | ICD-10-CM | POA: Diagnosis present

## 2021-09-03 DIAGNOSIS — I5023 Acute on chronic systolic (congestive) heart failure: Secondary | ICD-10-CM | POA: Diagnosis present

## 2021-09-03 DIAGNOSIS — J189 Pneumonia, unspecified organism: Secondary | ICD-10-CM | POA: Diagnosis present

## 2021-09-03 DIAGNOSIS — Z7982 Long term (current) use of aspirin: Secondary | ICD-10-CM

## 2021-09-03 DIAGNOSIS — E1165 Type 2 diabetes mellitus with hyperglycemia: Secondary | ICD-10-CM | POA: Diagnosis present

## 2021-09-03 DIAGNOSIS — Z794 Long term (current) use of insulin: Secondary | ICD-10-CM | POA: Diagnosis not present

## 2021-09-03 DIAGNOSIS — Z992 Dependence on renal dialysis: Secondary | ICD-10-CM | POA: Diagnosis not present

## 2021-09-03 DIAGNOSIS — R609 Edema, unspecified: Secondary | ICD-10-CM

## 2021-09-03 DIAGNOSIS — I132 Hypertensive heart and chronic kidney disease with heart failure and with stage 5 chronic kidney disease, or end stage renal disease: Secondary | ICD-10-CM | POA: Diagnosis present

## 2021-09-03 DIAGNOSIS — I5043 Acute on chronic combined systolic (congestive) and diastolic (congestive) heart failure: Secondary | ICD-10-CM

## 2021-09-03 DIAGNOSIS — E1140 Type 2 diabetes mellitus with diabetic neuropathy, unspecified: Secondary | ICD-10-CM

## 2021-09-03 DIAGNOSIS — S81802A Unspecified open wound, left lower leg, initial encounter: Secondary | ICD-10-CM

## 2021-09-03 DIAGNOSIS — E1151 Type 2 diabetes mellitus with diabetic peripheral angiopathy without gangrene: Secondary | ICD-10-CM | POA: Diagnosis present

## 2021-09-03 DIAGNOSIS — S37009A Unspecified injury of unspecified kidney, initial encounter: Secondary | ICD-10-CM

## 2021-09-03 DIAGNOSIS — L03119 Cellulitis of unspecified part of limb: Secondary | ICD-10-CM

## 2021-09-03 LAB — CBC WITH DIFFERENTIAL/PLATELET
Abs Immature Granulocytes: 0.1 10*3/uL — ABNORMAL HIGH (ref 0.00–0.07)
Basophils Absolute: 0.1 10*3/uL (ref 0.0–0.1)
Basophils Relative: 1 %
Eosinophils Absolute: 0.3 10*3/uL (ref 0.0–0.5)
Eosinophils Relative: 4 %
HCT: 22.7 % — ABNORMAL LOW (ref 39.0–52.0)
Hemoglobin: 7.4 g/dL — ABNORMAL LOW (ref 13.0–17.0)
Immature Granulocytes: 1 %
Lymphocytes Relative: 5 %
Lymphs Abs: 0.5 10*3/uL — ABNORMAL LOW (ref 0.7–4.0)
MCH: 32.5 pg (ref 26.0–34.0)
MCHC: 32.6 g/dL (ref 30.0–36.0)
MCV: 99.6 fL (ref 80.0–100.0)
Monocytes Absolute: 0.8 10*3/uL (ref 0.1–1.0)
Monocytes Relative: 10 %
Neutro Abs: 6.6 10*3/uL (ref 1.7–7.7)
Neutrophils Relative %: 79 %
Platelets: 535 10*3/uL — ABNORMAL HIGH (ref 150–400)
RBC: 2.28 MIL/uL — ABNORMAL LOW (ref 4.22–5.81)
RDW: 12.8 % (ref 11.5–15.5)
WBC: 8.4 10*3/uL (ref 4.0–10.5)
nRBC: 0 % (ref 0.0–0.2)

## 2021-09-03 LAB — BASIC METABOLIC PANEL
Anion gap: 9 (ref 5–15)
BUN: 101 mg/dL — ABNORMAL HIGH (ref 6–20)
CO2: 21 mmol/L — ABNORMAL LOW (ref 22–32)
Calcium: 8 mg/dL — ABNORMAL LOW (ref 8.9–10.3)
Chloride: 111 mmol/L (ref 98–111)
Creatinine, Ser: 5.45 mg/dL — ABNORMAL HIGH (ref 0.61–1.24)
GFR, Estimated: 11 mL/min — ABNORMAL LOW (ref >60)
Glucose, Bld: 229 mg/dL — ABNORMAL HIGH (ref 70–99)
Potassium: 3.9 mmol/L (ref 3.5–5.1)
Sodium: 141 mmol/L (ref 135–145)

## 2021-09-03 LAB — RESP PANEL BY RT-PCR (FLU A&B, COVID) ARPGX2
Influenza A by PCR: NEGATIVE
Influenza B by PCR: NEGATIVE
SARS Coronavirus 2 by RT PCR: NEGATIVE

## 2021-09-03 LAB — GLUCOSE, CAPILLARY: Glucose-Capillary: 94 mg/dL (ref 70–99)

## 2021-09-03 LAB — CBG MONITORING, ED: Glucose-Capillary: 171 mg/dL — ABNORMAL HIGH (ref 70–99)

## 2021-09-03 MED ORDER — HEPARIN SODIUM (PORCINE) 5000 UNIT/ML IJ SOLN
5000.0000 [IU] | Freq: Three times a day (TID) | INTRAMUSCULAR | Status: DC
  Administered 2021-09-03 – 2021-09-09 (×17): 5000 [IU] via SUBCUTANEOUS
  Filled 2021-09-03 (×17): qty 1

## 2021-09-03 MED ORDER — ACETAMINOPHEN 325 MG PO TABS
650.0000 mg | ORAL_TABLET | Freq: Four times a day (QID) | ORAL | Status: DC | PRN
  Administered 2021-09-04: 650 mg via ORAL
  Filled 2021-09-03: qty 2

## 2021-09-03 MED ORDER — GLIPIZIDE ER 2.5 MG PO TB24: 2.5000 mg | ORAL_TABLET | Freq: Every day | ORAL | Status: DC | PRN

## 2021-09-03 MED ORDER — MAGNESIUM HYDROXIDE 400 MG/5ML PO SUSP: 30.0000 mL | Freq: Every day | ORAL | Status: DC | PRN

## 2021-09-03 MED ORDER — ENOXAPARIN SODIUM 30 MG/0.3ML IJ SOSY: 30.0000 mg | PREFILLED_SYRINGE | INTRAMUSCULAR | Status: DC

## 2021-09-03 MED ORDER — ISOSORBIDE MONONITRATE ER 30 MG PO TB24
30.0000 mg | ORAL_TABLET | Freq: Two times a day (BID) | ORAL | Status: DC
  Administered 2021-09-03 – 2021-09-10 (×13): 30 mg via ORAL
  Filled 2021-09-03 (×13): qty 1

## 2021-09-03 MED ORDER — MORPHINE SULFATE (PF) 2 MG/ML IV SOLN: 2.0000 mg | INTRAVENOUS | Status: DC | PRN

## 2021-09-03 MED ORDER — ONDANSETRON HCL 4 MG/2ML IJ SOLN
4.0000 mg | Freq: Four times a day (QID) | INTRAMUSCULAR | Status: DC | PRN
  Administered 2021-09-08: 14:00:00 4 mg via INTRAVENOUS
  Filled 2021-09-03: qty 2

## 2021-09-03 MED ORDER — APIXABAN 5 MG PO TABS: 5.0000 mg | ORAL_TABLET | Freq: Two times a day (BID) | ORAL | Status: DC

## 2021-09-03 MED ORDER — ACETAMINOPHEN 650 MG RE SUPP: 650.0000 mg | Freq: Four times a day (QID) | RECTAL | Status: DC | PRN

## 2021-09-03 MED ORDER — ADULT MULTIVITAMIN W/MINERALS CH
1.0000 | ORAL_TABLET | Freq: Every day | ORAL | Status: DC
  Administered 2021-09-04 – 2021-09-10 (×5): 1 via ORAL
  Filled 2021-09-03 (×6): qty 1

## 2021-09-03 MED ORDER — SODIUM CHLORIDE 0.9 % IV SOLN: INTRAVENOUS | Status: DC

## 2021-09-03 MED ORDER — INSULIN ASPART 100 UNIT/ML IJ SOLN
0.0000 [IU] | INTRAMUSCULAR | Status: DC
  Administered 2021-09-03: 3 [IU] via SUBCUTANEOUS
  Administered 2021-09-04: 13:00:00 2 [IU] via SUBCUTANEOUS
  Administered 2021-09-04 – 2021-09-05 (×2): 3 [IU] via SUBCUTANEOUS
  Administered 2021-09-05: 13:00:00 2 [IU] via SUBCUTANEOUS
  Administered 2021-09-06: 20:00:00 3 [IU] via SUBCUTANEOUS
  Administered 2021-09-07: 02:00:00 2 [IU] via SUBCUTANEOUS
  Administered 2021-09-08 – 2021-09-09 (×2): 3 [IU] via SUBCUTANEOUS
  Filled 2021-09-03 (×11): qty 1

## 2021-09-03 MED ORDER — ASPIRIN EC 81 MG PO TBEC
81.0000 mg | DELAYED_RELEASE_TABLET | Freq: Every day | ORAL | Status: DC
  Administered 2021-09-04: 81 mg via ORAL
  Filled 2021-09-03: qty 1

## 2021-09-03 MED ORDER — FERROUS SULFATE 325 (65 FE) MG PO TABS
325.0000 mg | ORAL_TABLET | Freq: Every day | ORAL | Status: DC
  Administered 2021-09-04 – 2021-09-10 (×6): 325 mg via ORAL
  Filled 2021-09-03 (×6): qty 1

## 2021-09-03 MED ORDER — ONDANSETRON HCL 4 MG PO TABS: 4.0000 mg | ORAL_TABLET | Freq: Four times a day (QID) | ORAL | Status: DC | PRN

## 2021-09-03 MED ORDER — ATORVASTATIN CALCIUM 20 MG PO TABS
40.0000 mg | ORAL_TABLET | Freq: Every day | ORAL | Status: DC
  Administered 2021-09-03 – 2021-09-09 (×7): 40 mg via ORAL
  Filled 2021-09-03 (×8): qty 2

## 2021-09-03 MED ORDER — CARVEDILOL 25 MG PO TABS
25.0000 mg | ORAL_TABLET | Freq: Two times a day (BID) | ORAL | Status: DC
  Administered 2021-09-04 – 2021-09-10 (×12): 25 mg via ORAL
  Filled 2021-09-03 (×13): qty 1

## 2021-09-03 MED ORDER — TRAZODONE HCL 50 MG PO TABS: 25.0000 mg | ORAL_TABLET | Freq: Every evening | ORAL | Status: DC | PRN

## 2021-09-03 MED ORDER — VANCOMYCIN VARIABLE DOSE PER UNSTABLE RENAL FUNCTION (PHARMACIST DOSING): Status: DC

## 2021-09-03 MED ORDER — VANCOMYCIN HCL IN DEXTROSE 1-5 GM/200ML-% IV SOLN: 1000.0000 mg | Freq: Once | INTRAVENOUS | Status: DC

## 2021-09-03 MED ORDER — HYDRALAZINE HCL 50 MG PO TABS
50.0000 mg | ORAL_TABLET | Freq: Three times a day (TID) | ORAL | Status: DC
  Administered 2021-09-03 – 2021-09-10 (×18): 50 mg via ORAL
  Filled 2021-09-03 (×19): qty 1

## 2021-09-03 MED ORDER — VANCOMYCIN HCL 1500 MG/300ML IV SOLN
1500.0000 mg | Freq: Once | INTRAVENOUS | Status: AC
  Administered 2021-09-03: 1500 mg via INTRAVENOUS
  Filled 2021-09-03 (×3): qty 300

## 2021-09-03 MED ORDER — SODIUM CHLORIDE 0.9 % IV SOLN
2.0000 g | INTRAVENOUS | Status: DC
  Administered 2021-09-04: 2 g via INTRAVENOUS
  Filled 2021-09-03 (×3): qty 2

## 2021-09-03 MED ORDER — LACTATED RINGERS IV SOLN: INTRAVENOUS | Status: DC

## 2021-09-03 MED ORDER — LACTATED RINGERS IV SOLN: INTRAVENOUS | Status: AC

## 2021-09-03 MED ORDER — METOLAZONE 2.5 MG PO TABS: 2.5000 mg | ORAL_TABLET | Freq: Two times a day (BID) | ORAL | Status: DC

## 2021-09-03 NOTE — H&P (Addendum)
[name]   PATIENT NAME: Nathan [name]    MR#:  [id number]  DATE OF BIRTH:  1961/06/19  DATE OF ADMISSION:  09/03/2021  PRIMARY CARE PHYSICIAN: Derinda Late, MD   Patient is coming from: Home  REQUESTING/REFERRING PHYSICIAN: Hulan Saas, MD  CHIEF COMPLAINT:  Bleeding from his left leg wound   HISTORY OF PRESENT ILLNESS:  Nathan [name] is a 60 y.o. male with medical history significant for type 2 diabetes mellitus, hypertension, paroxysmal atrial fibrillation(he stopped taking Eliquis on his own due to bleeding), combined systolic and diastolic CHF and stage III chronic kidney disease, who presented to the ER with acute onset of bleeding from his left calf wound.  He has been having worsening erythema and and had a ruptured blister over his medial calf about a month ago.  He admitted to mild associated tenderness.  He has not been using any dressings.  He does not follow with any wound clinic.  No recent trauma.  He denies any fever or chills.  No chest pain or dyspnea or wheezing.  He has been having cough and occasional posttussive vomiting that is nonbloody and nonbilious..  No abdominal pain melena or bright red bleeding per rectum.  No dysuria, oliguria, urinary frequency or urgency or flank pain.  He denies any poor appetite.    ED Course: He came to the ER blood pressure was 174/88 with otherwise normal vital signs.  Labs revealed hyperglycemia of 229 and a BUN of 101 with a creatinine of 5.45 compared to 86 and 3.86 on 03/18/2021.  CBC showed anemia with hemoglobin of 7.4 hematocrit 22.7 slightly worse than 03/18/2021 when they were 8.5/26.5.  Platelets were 535 compared to 277 then.  Respiratory panel is currently pending.  Imaging: X-ray of the left tibia and fibula reveal no acute osseous abnormalities.  Renal ultrasound is currently pending.  The patient was given IV vancomycin and IV lactated Ringer's at 125 mL/h.  He will be admitted to a medical  telemetry bed for further evaluation and management.   PAST MEDICAL HISTORY:   Past Medical History:  Diagnosis Date   Anemia of chronic disease    Chronic combined systolic (congestive) and diastolic (congestive) heart failure ([place])    a. 05/2019 Echo: EF 45-50%, nl LA/RA size. Mild to mod TR.   CKD (chronic kidney disease), stage III ([place])    Diabetes mellitus without complication ([place])    Hypertension    Left Hydropneumothorax/Fibrinopurulent empyema    a. 06/2019 s/p VATS.   PAF (paroxysmal atrial fibrillation) ([place])    a. 05/2019 in setting of sepsis-->s/p TEE/DCCV; b. CHA2DS2VASc = 2-->Eliquis.   Sepsis ([place])    a. 05/2019 MRSA in setting of infected R thumb.    PAST SURGICAL HISTORY:   Past Surgical History:  Procedure Laterality Date   HAND SURGERY Right    I & D EXTREMITY Right 06/11/2019   Procedure: IRRIGATION AND DEBRIDEMENT RIGHT THUMB;  Surgeon: Dereck Leep, MD;  Location: [facility]S;  Service: Orthopedics;  Laterality: Right;   TEE WITHOUT CARDIOVERSION N/A 06/14/2019   Procedure: TRANSESOPHAGEAL ECHOCARDIOGRAM (TEE);  Surgeon: Minna Merritts, MD;  Location: [facility];  Service: Cardiovascular;  Laterality: N/A;   VIDEO ASSISTED THORACOSCOPY (VATS)/THOROCOTOMY Left 06/26/2019   Procedure: JQBHA ASSISTED THORACOSCOPY DECORTICATION;  Surgeon: Lajuana Matte, MD;  Location: [facility];  Service: Thoracic;  Laterality: Left;   VIDEO BRONCHOSCOPY N/A 06/26/2019   Procedure: VIDEO BRONCHOSCOPY;  Surgeon: Melodie Bouillon  O, MD;  Location: [facility];  Service: Thoracic;  Laterality: N/A;    SOCIAL HISTORY:   Social History   Tobacco Use   Smoking status: Never   Smokeless tobacco: Never  Substance Use Topics   Alcohol use: Never    FAMILY HISTORY:   Family History  Problem Relation Age of Onset   CAD Father    Diabetes Sister    Diabetes Brother    Diabetes Sister    Diabetes Sister     DRUG ALLERGIES:  No Known Allergies  REVIEW OF  SYSTEMS:   ROS As per history of present illness. All pertinent systems were reviewed above. Constitutional, HEENT, cardiovascular, respiratory, GI, GU, musculoskeletal, neuro, psychiatric, endocrine, integumentary and hematologic systems were reviewed and are otherwise negative/unremarkable except for positive findings mentioned above in the HPI.   MEDICATIONS AT HOME:   Prior to Admission medications   Medication Sig Start Date End Date Taking? Authorizing Provider  acetaminophen (TYLENOL) 500 MG tablet Take 500-1,000 mg by mouth every 6 (six) hours as needed for mild pain or moderate pain.    [provider]  apixaban (ELIQUIS) 5 MG TABS tablet Take 1 tablet (5 mg total) by mouth 2 (two) times daily. 03/19/21   Fritzi Mandes, MD  aspirin EC 81 MG tablet Take 81 mg by mouth daily.    [provider]  atorvastatin (LIPITOR) 40 MG tablet Take 40 mg by mouth daily.    [provider]  carvedilol (COREG) 25 MG tablet Take 1 tablet (25 mg total) by mouth 2 (two) times daily. 03/19/21   Fritzi Mandes, MD  ferrous sulfate 325 (65 FE) MG tablet Take 1 tablet (325 mg total) by mouth daily with breakfast. 12/04/20   Loletha Grayer, MD  furosemide (LASIX) 80 MG tablet Take 1 tablet (80 mg total) by mouth 2 (two) times daily. 03/19/21 03/19/22  Fritzi Mandes, MD  glipiZIDE (GLUCOTROL XL) 2.5 MG 24 hr tablet Take 2.5 mg by mouth daily as needed (elevated blood glucose).    [provider]  hydrALAZINE (APRESOLINE) 50 MG tablet Take 1 tablet (50 mg total) by mouth 3 (three) times daily. 03/19/21   Fritzi Mandes, MD  isosorbide mononitrate (IMDUR) 30 MG 24 hr tablet Take 1 tablet (30 mg total) by mouth 2 (two) times daily. 07/21/20   Minna Merritts, MD  metolazone (ZAROXOLYN) 2.5 MG tablet Take 1 tablet (2.5 mg total) by mouth 2 (two) times daily. 03/19/21   Fritzi Mandes, MD  Multiple Vitamin (MULTIVITAMIN) tablet Take 1 tablet by mouth daily.    [provider]       VITAL SIGNS:  Blood pressure (!) 163/75, pulse 79, temperature 97.8 F (36.6 C), temperature source Oral, resp. rate 16, height 5\' 9"  (1.753 m), weight 65.8 kg, SpO2 98 %.  PHYSICAL EXAMINATION:  Physical Exam  GENERAL:  60 y.o.-year-old Caucasian male patient lying in the bed with no acute distress.  EYES: Pupils equal, round, reactive to light and accommodation. No scleral icterus. Extraocular muscles intact.  HEENT: Head atraumatic, normocephalic. Oropharynx and nasopharynx clear.  NECK:  Supple, no jugular venous distention. No thyroid enlargement, no tenderness.  LUNGS: Normal breath sounds bilaterally, no wheezing, rales,rhonchi or crepitation. No use of accessory muscles of respiration.  CARDIOVASCULAR: Regular rate and rhythm, S1, S2 normal. No murmurs, rubs, or gallops.  ABDOMEN: Soft, nondistended, nontender. Bowel sounds present. No organomegaly or mass.  EXTREMITIES: Bilateral lower extremity pitting edema more on the right than the  left with no cyanosis, or clubbing.  Right leg is chronically swollen more than the left per his report. NEUROLOGIC: Cranial nerves II through XII are intact. Muscle strength 5/5 in all extremities. Sensation intact. Gait not checked.  PSYCHIATRIC: The patient is alert and oriented x 3.  Normal affect and good eye contact. SKIN: Left leg as shown below has a large medial calf ulcer at the site of a ruptured blister with yellowish eschar, dried blood and surrounding anterior leg and calf erythema with mild tenderness and induration with lichenification.   LABORATORY PANEL:   CBC Recent Labs  Lab 09/03/21 1858  WBC 8.4  HGB 7.4*  HCT 22.7*  PLT 535*   ------------------------------------------------------------------------------------------------------------------  Chemistries  Recent Labs  Lab 09/03/21 1858  NA 141  K 3.9  CL 111  CO2 21*  GLUCOSE 229*  BUN 101*  CREATININE 5.45*  CALCIUM 8.0*    ------------------------------------------------------------------------------------------------------------------  Cardiac Enzymes No results for input(s): TROPONINI in the last 168 hours. ------------------------------------------------------------------------------------------------------------------  RADIOLOGY:  DG Tibia/Fibula Left  Result Date: 09/03/2021 CLINICAL DATA:  Weeping of lower leg, bleeding wound at heel, blisters EXAM: LEFT TIBIA AND FIBULA - 2 VIEW COMPARISON:  None FINDINGS: Osseous demineralization. Knee and ankle joint alignments normal. No acute fracture, dislocation, or bone destruction. Diffuse soft tissue swelling greatest at ankle. Scattered atherosclerotic calcifications within runoff vessels. IMPRESSION: No acute osseous abnormalities. Electronically Signed   By: Lavonia Dana M.D.   On: 09/03/2021 19:12      IMPRESSION AND PLAN:  Principal Problem:   Left leg cellulitis  1.  Left lower extremity severe nonpurulent cellulitis. - The patient will be admitted to a medical telemetry bed. - We will continue antibiotic therapy with IV vancomycin and cefepime. - Warm compresses will be utilized. - Pain management will be provided. - Wound gram stain culture and sensitivity will be obtained. - We will obtain bilateral lower extremity venous Doppler to rule out DVT. - Wound care consult to be obtained.  2.  Acute kidney injury superimposed on stage IV chronic kidney disease. - Bilateral renal ultrasound is currently pending. - Nephrology consult will be obtained. - I notified Dr. Candiss Norse about the patient. - We will avoid nephrotoxins.  3.  Paroxysmal atrial fibrillation. - We will obtain EKG. - We will continue Coreg. - She continues to refuse p.o. Eliquis.  4.  Dyslipidemia. - We will continue statin therapy.  5.  Type 2 diabetes mellitus. - He will be placed on supplement coverage with NovoLog. - We will hold his Glucotrol XL.  6.  Chronic systolic  and diastolic CHF. - We will hold off diuretics temporarily pending improvement of renal functions.  7.  Essential hypertension. - We will continue Coreg, hydralazine and Imdur.  8.  Anemia of chronic disease slightly worsening. - We will continue ferrous sulfate and monitor H&H.    DVT prophylaxis: Subcutaneous heparin. Code Status: full code. Family Communication:  The plan of care was discussed in details with the patient (and family). I answered all questions. The patient agreed to proceed with the above mentioned plan. Further management will depend upon hospital course. Disposition Plan: Back to previous home environment Consults called: Nephrology. All the records are reviewed and case discussed with ED provider.  Status is: Inpatient   Remains inpatient appropriate because:Ongoing diagnostic testing needed not appropriate for outpatient work up, Unsafe d/c plan, IV treatments appropriate due to intensity of illness or inability to take PO, and Inpatient level of care appropriate  due to severity of illness   Dispo: The patient is from: Home              Anticipated d/c is to: Home              Patient currently is not medically stable to d/c.              Difficult to place patient: No     Christel Mormon M.D on 09/03/2021 at 8:29 PM  [facility]   From 7 PM-7 AM, contact night-coverage [email or web address]  CC: Primary care physician; Derinda Late, MD

## 2021-09-03 NOTE — Discharge Instructions (Addendum)
Peritoneal Dialysis Catheter Placement, Care After The following information offers guidance on how to care for yourself after your procedure. Your health care provider may also give you more specific instructions. If you have problems or questions, contact your health care provider. What can I expect after the procedure? After the procedure, it is common to have some pain or discomfort in your abdomen and your incision area. You may need to wait 2 weeks after your procedure before you can start peritoneal dialysis treatment. If you need dialysis before that time, your health care provider may begin peritoneal dialysis treatment early or offer kidney dialysis treatments (hemodialysis) until you heal. Follow these instructions at home: Incision care Follow instructions from your health care provider about how to take care of your incision or incisions. Make sure you: Wash your hands with soap and water for at least 20 seconds before and after you change your bandage (dressing). If soap and water are not available, use hand sanitizer. Change your dressing only as told by your health care provider. Your health care provider may tell you not to touch or change your dressing. Leave stitches (sutures), staples, skin glue, or adhesive strips in place. These skin closures may need to stay in place for 2 weeks or longer. If adhesive strip edges start to loosen and curl up, you may trim the loose edges. Do not remove adhesive strips completely unless your health care provider tells you to do that. Check your incision areas every day for signs of infection. If you were instructed not to touch or change your dressing, look at your dressing for signs of infection. Check for: Redness, swelling, or more pain. Fluid or blood. Warmth. Pus or a bad smell. Medicines Take over-the-counter and prescription medicines only as told by your health care provider. If you were prescribed an antibiotic medicine, use it as told  by your health care provider. Do not stop using the antibiotic even if you start to feel better. Ask your health care provider if the medicine prescribed to you requires you to avoid driving or using machinery. Driving Do not drive or ride in a car until your health care provider approves. Your seat belt could move the catheter out of position or cause irritation by rubbing on your incision. Activity Rest and limit your activity. Do not lift anything that is heavier than 10 lb (4.5 kg), or the limit that you are told, until your health care provider says that it is safe. Return to your normal activities as told by your health care provider. Ask your health care provider what activities are safe for you. Managing constipation Your condition may cause constipation. To prevent or treat constipation, you may need to: Drink enough fluid to keep your urine pale yellow. Take over-the-counter or prescription medicines. Eat foods that are high in fiber, such as beans, whole grains, and fresh fruits and vegetables. Limit foods that are high in fat and processed sugars, such as fried or sweet foods. General instructions Do not use any products that contain nicotine or tobacco. These products include cigarettes, chewing tobacco, and vaping devices, such as e-cigarettes. If you need help quitting, ask your health care provider. Follow instructions from your health care provider about eating or drinking restrictions. Do not take baths, swim, or use a hot tub until your health care provider approves. Ask your health care provider if you may take showers. You may only be allowed to take sponge baths. Wear loose-fitting clothing that keeps the catheter  covered so that it cannot get caught on something. Keep your catheter clean and dry. Keep all follow-up visits. This is important. Contact a health care provider if: You have a fever or chills. You have warmth, redness, swelling, or more pain around an  incision. You have fluid or blood coming from an incision. You have pus or a bad smell coming from an incision. You cannot eat or drink without vomiting. Get help right away if: You have problems breathing. You are confused. You have trouble speaking. You have severe pain in your abdomen that does not get better with treatment. You have bright red blood in your stool (feces), or your stool is dark black and looks like tar. These symptoms may represent a serious problem that is an emergency. Do not wait to see if the symptoms will go away. Get medical help right away. Call your local emergency services (911 in the [place] Do not drive yourself to the hospital. Summary After the procedure, it is common to have some pain or discomfort in your abdomen, your incision area, or both. You may have to wait 2 weeks after your procedure before you can start peritoneal dialysis treatment. Check your incision area every day for signs of infection. Get medical help right away if you have severe pain in your abdomen that does not get better with treatment. This information is not intended to replace advice given to you by your health care provider. Make sure you discuss any questions you have with your health care provider. Document Revised: 04/24/2020 Document Reviewed: 04/24/2020 Elsevier Patient Education  2022 [employer].

## 2021-09-03 NOTE — ED Provider Notes (Signed)
To the door  [facility] Emergency Department Provider Note  ____________________________________________   Event Date/Time   First MD Initiated Contact with Patient 09/03/21 1835     (approximate)  I have reviewed the triage vital signs and the nursing notes.   HISTORY  Chief Complaint Foot Pain   HPI Nathan [name] is a 60 y.o. male with medical history significant for HTN, HDL, PAF, CKD4, CHF, and PVD with some chronic lower extremity neuropathy who presents for assessment of some bleeding from the wound of the left calf.  Patient states he has had some redness and blistering the posterior aspect the calf for about a month.  He states he noticed it bleeding about 30 minutes prior to arrival.  He denies any recent or preceding trauma or injuries.  States is a little sore in the calf but otherwise not had any new pain today and that the soreness is going on for several weeks.  Denies any pain in the left knee, hip, right lower extremity or elsewhere.  Endorses a chronic cough with some chronic.  Tussive emesis which is no different today than it has been over the last couple months.  No new chest pain, fevers, headache, earache, sore throat, abdominal pain, back pain rash or extremity pain.  No other acute concerns at this time.  States he has not had this wound evaluated by a physician prior to today.         Past Medical History:  Diagnosis Date   Anemia of chronic disease    Chronic combined systolic (congestive) and diastolic (congestive) heart failure ([place])    a. 05/2019 Echo: EF 45-50%, nl LA/RA size. Mild to mod TR.   CKD (chronic kidney disease), stage III ([place])    Diabetes mellitus without complication ([place])    Hypertension    Left Hydropneumothorax/Fibrinopurulent empyema    a. 06/2019 s/p VATS.   PAF (paroxysmal atrial fibrillation) ([place])    a. 05/2019 in setting of sepsis-->s/p TEE/DCCV; b. CHA2DS2VASc = 2-->Eliquis.   Sepsis ([place])    a.  05/2019 MRSA in setting of infected R thumb.    Patient Active Problem List   Diagnosis Date Noted   Malnutrition of moderate degree 03/17/2021   Acute exacerbation of CHF (congestive heart failure) ([place]) 03/12/2021   Hyperlipidemia 03/12/2021   Proteinuria    CKD stage 4 due to type 2 diabetes mellitus (HCC)    PAF (paroxysmal atrial fibrillation) ([place])    Diabetes mellitus without complication ([place])    Acute kidney injury superimposed on CKD ([place])    Hydropneumothorax 06/24/2019   Essential hypertension 06/24/2019   Insulin dependent diabetes mellitus type IA ([place]) 06/24/2019   Anemia of chronic disease 06/24/2019   Atrial fibrillation, chronic ([place]) 06/24/2019   Acute on chronic HFrEF (heart failure with reduced ejection fraction) ([place]) 06/24/2019   Cellulitis of right hand 06/24/2019   CKD (chronic kidney disease), stage III ([place])    Empyema ([place])    Sepsis ([place]) 06/10/2019    Past Surgical History:  Procedure Laterality Date   HAND SURGERY Right    I & D EXTREMITY Right 06/11/2019   Procedure: IRRIGATION AND DEBRIDEMENT RIGHT THUMB;  Surgeon: Dereck Leep, MD;  Location: [facility] ORS;  Service: Orthopedics;  Laterality: Right;   TEE WITHOUT CARDIOVERSION N/A 06/14/2019   Procedure: TRANSESOPHAGEAL ECHOCARDIOGRAM (TEE);  Surgeon: Minna Merritts, MD;  Location: [facility] ORS;  Service: Cardiovascular;  Laterality: N/A;   VIDEO ASSISTED THORACOSCOPY (VATS)/THOROCOTOMY Left  06/26/2019   Procedure: VIDEO ASSISTED THORACOSCOPY DECORTICATION;  Surgeon: Lajuana Matte, MD;  Location: [place];  Service: Thoracic;  Laterality: Left;   VIDEO BRONCHOSCOPY N/A 06/26/2019   Procedure: VIDEO BRONCHOSCOPY;  Surgeon: Lajuana Matte, MD;  Location: [place];  Service: Thoracic;  Laterality: N/A;    Prior to Admission medications   Medication Sig Start Date End Date Taking? Authorizing Provider  acetaminophen (TYLENOL) 500 MG tablet Take 500-1,000 mg by mouth every 6 (six) hours as needed  for mild pain or moderate pain.    [provider]  apixaban (ELIQUIS) 5 MG TABS tablet Take 1 tablet (5 mg total) by mouth 2 (two) times daily. 03/19/21   Fritzi Mandes, MD  aspirin EC 81 MG tablet Take 81 mg by mouth daily.    [provider]  atorvastatin (LIPITOR) 40 MG tablet Take 40 mg by mouth daily.    [provider]  carvedilol (COREG) 25 MG tablet Take 1 tablet (25 mg total) by mouth 2 (two) times daily. 03/19/21   Fritzi Mandes, MD  ferrous sulfate 325 (65 FE) MG tablet Take 1 tablet (325 mg total) by mouth daily with breakfast. 12/04/20   Loletha Grayer, MD  furosemide (LASIX) 80 MG tablet Take 1 tablet (80 mg total) by mouth 2 (two) times daily. 03/19/21 03/19/22  Fritzi Mandes, MD  glipiZIDE (GLUCOTROL XL) 2.5 MG 24 hr tablet Take 2.5 mg by mouth daily as needed (elevated blood glucose).    [provider]  hydrALAZINE (APRESOLINE) 50 MG tablet Take 1 tablet (50 mg total) by mouth 3 (three) times daily. 03/19/21   Fritzi Mandes, MD  isosorbide mononitrate (IMDUR) 30 MG 24 hr tablet Take 1 tablet (30 mg total) by mouth 2 (two) times daily. 07/21/20   Minna Merritts, MD  metolazone (ZAROXOLYN) 2.5 MG tablet Take 1 tablet (2.5 mg total) by mouth 2 (two) times daily. 03/19/21   Fritzi Mandes, MD  Multiple Vitamin (MULTIVITAMIN) tablet Take 1 tablet by mouth daily.    [provider]    Allergies Patient has no known allergies.  Family History  Problem Relation Age of Onset   CAD Father    Diabetes Sister    Diabetes Brother    Diabetes Sister    Diabetes Sister     Social History Social History   Tobacco Use   Smoking status: Never   Smokeless tobacco: Never  Vaping Use   Vaping Use: Never used  Substance Use Topics   Alcohol use: Never   Drug use: Never    Review of Systems  Review of Systems  Constitutional:  Negative for chills and fever.  HENT:  Negative for sore throat.   Eyes:  Negative for pain.  Respiratory:  Positive for  cough (chronic). Negative for stridor.   Cardiovascular:  Negative for chest pain.  Gastrointestinal:  Negative for vomiting.  Genitourinary:  Negative for dysuria.  Musculoskeletal:  Positive for myalgias (L lower leg).  Skin:  Negative for rash.  Neurological:  Negative for seizures, loss of consciousness and headaches.  Psychiatric/Behavioral:  Negative for suicidal ideas.   All other systems reviewed and are negative.    ____________________________________________   PHYSICAL EXAM:  VITAL SIGNS: Nathan Triage Vitals  Enc Vitals Group     BP 09/03/21 1822 (!) 174/88     Pulse Rate 09/03/21 1822 86     Resp 09/03/21 1822 20     Temp 09/03/21 1822 97.8 F (36.6 C)  Temp Source 09/03/21 1822 Oral     SpO2 09/03/21 1822 98 %     Weight 09/03/21 1824 145 lb (65.8 kg)     Height 09/03/21 1824 5\' 9"  (1.753 m)     Head Circumference --      Peak Flow --      Pain Score 09/03/21 1824 0     Pain Loc --      Pain Edu? --      Excl. in [place]? --    Vitals:   09/03/21 1822 09/03/21 1900  BP: (!) 174/88 (!) 163/75  Pulse: 86 79  Resp: 20 16  Temp: 97.8 F (36.6 C)   SpO2: 98% 98%   Physical Exam Vitals and nursing note reviewed.  Constitutional:      General: He is not in acute distress.    Appearance: He is well-developed.  HENT:     Head: Normocephalic and atraumatic.     Right Ear: External ear normal.     Left Ear: External ear normal.     Nose: Nose normal.     Mouth/Throat:     Mouth: Mucous membranes are dry.  Eyes:     Conjunctiva/sclera: Conjunctivae normal.  Cardiovascular:     Rate and Rhythm: Normal rate and regular rhythm.     Heart sounds: No murmur heard. Pulmonary:     Effort: Pulmonary effort is normal. No respiratory distress.     Breath sounds: Normal breath sounds.  Abdominal:     Palpations: Abdomen is soft.     Tenderness: There is no abdominal tenderness.  Musculoskeletal:        General: No swelling.     Cervical back: Neck supple.      Right lower leg: Edema present.     Left lower leg: Edema present.  Skin:    General: Skin is warm and dry.     Capillary Refill: Capillary refill takes 2 to 3 seconds.  Neurological:     Mental Status: He is alert and oriented to person, place, and time.  Psychiatric:        Mood and Affect: Mood normal.    There is a little bit more edema on the right lower extremity compared to the left.  The left is fairly erythematous from just distal to the knee of the foot.  There is no effusion.  Significant decreased range of motion strength at the ankle but is a fairly large ulcerated area on the posterior aspect of the calf approximately 6 x 12 cm.  There is a little bit of purulent drainage and some scant bleeding.  Patient has diminished sensation to light touch of the bilateral extremities which he states is baseline. ____________________________________________   LABS (all labs ordered are listed, but only abnormal results are displayed)  Labs Reviewed  CBC WITH DIFFERENTIAL/PLATELET - Abnormal; Notable for the following components:      Result Value   RBC 2.28 (*)    Hemoglobin 7.4 (*)    HCT 22.7 (*)    Platelets 535 (*)    Lymphs Abs 0.5 (*)    Abs Immature Granulocytes 0.10 (*)    All other components within normal limits  BASIC METABOLIC PANEL - Abnormal; Notable for the following components:   CO2 21 (*)    Glucose, Bld 229 (*)    BUN 101 (*)    Creatinine, Ser 5.45 (*)    Calcium 8.0 (*)    GFR, Estimated 11 (*)  All other components within normal limits  AEROBIC/ANAEROBIC CULTURE W GRAM STAIN (SURGICAL/DEEP WOUND)  URINALYSIS, COMPLETE (UACMP) WITH MICROSCOPIC  HEMOGLOBIN A1C   ____________________________________________  EKG  ____________________________________________  RADIOLOGY  ED MD interpretation: Plain film of the left tibia shows no fracture dislocation or other osseous abnormalities.  There is some diffuse tissue swelling greatest around the superior  aspect of the ankle and some scattered atherosclerotic calcifications.  Official radiology report(s): DG Tibia/Fibula Left  Result Date: 09/03/2021 CLINICAL DATA:  Weeping of lower leg, bleeding wound at heel, blisters EXAM: LEFT TIBIA AND FIBULA - 2 VIEW COMPARISON:  None FINDINGS: Osseous demineralization. Knee and ankle joint alignments normal. No acute fracture, dislocation, or bone destruction. Diffuse soft tissue swelling greatest at ankle. Scattered atherosclerotic calcifications within runoff vessels. IMPRESSION: No acute osseous abnormalities. Electronically Signed   By: Lavonia Dana M.D.   On: 09/03/2021 19:12    ____________________________________________   PROCEDURES  Procedure(s) performed (including Critical Care):  .1-3 Lead EKG Interpretation Performed by: Lucrezia Starch, MD Authorized by: Lucrezia Starch, MD     Interpretation: non-specific     ECG rate assessment: normal     Rhythm: sinus rhythm     Ectopy: none     Conduction: normal     ____________________________________________   INITIAL IMPRESSION / ASSESSMENT AND PLAN / Nathan COURSE      Patient presents for assessment of some bleeding from wound on the left posterior calf.  It seemed bothering and wheezing clearish fluid over the last month.  On arrival patient is slight hypertensive with otherwise stable vital signs on room air.  On exam he is a fairly large ulcer over the posterior left calf and some anterior erythema concerning for purulent cellulitis.  There is edema on the left is actually a little bit less on the right I have a lower suspicion for an acute DVT in the left.   Plain film of the left tibia shows no fracture dislocation or other osseous abnormalities.  There is some diffuse tissue swelling greatest around the superior aspect of the ankle and some scattered atherosclerotic calcifications.  Suspect likely cellulitis in the setting of fairly advanced peripheral vascular disease and  neuropathy.  CBC shows no leukocytosis and hemoglobin at baseline at 7.4 compared to 8.55 months ago.  Given absence of fever or other SIRS criteria I do not believe patient is septic at this time.  His BMP is remarkable for evidence of an AKI on CKD with a BUN of 101 and a creatinine of 5.45.  His glucose is 229 and bicarb is 21 with an anion gap of 9 not consistent with DKA and there is no other significant electrolyte metabolic derangements.  Seems patient's most recent labs were on 7/6 when his creatinine at that time was 3.98.  This represents a fairly significant AKI and I think warrants observation for gentle hydration.  Certainly possible this is related to a cellulitis although patient is also on fairly high-dose Lasix for his CHF and this is also likely contributing.  He otherwise appears mildly dehydrated. Has not had any other significant volume loss i.e. GI losses but does endorse chronic p.o. or poor appetite.  We will gently hydrate given history of CHF requiring hospitalizations and give a dose of IV antibiotics..  Also put an order in for a wound culture to be obtained from the left posterior calf and a UA and renal ultrasound.  Admit to medicine service for further evaluation and management.  ____________________________________________   FINAL CLINICAL IMPRESSION(S) / Nathan DIAGNOSES  Final diagnoses:  Cellulitis of left lower extremity  AKI (acute kidney injury) ([place])  Hyperglycemia    Medications  vancomycin (VANCOREADY) IVPB 1500 mg/300 mL (has no administration in time range)  lactated ringers infusion (has no administration in time range)  insulin aspart (novoLOG) injection 0-15 Units (has no administration in time range)     Nathan Discharge Orders     None        Note:  This document was prepared using Dragon voice recognition software and may include unintentional dictation errors.    Lucrezia Starch, MD 09/03/21 [phone]

## 2021-09-03 NOTE — ED Triage Notes (Signed)
Pt to ED via POV with c/o when he woke up from a nap he discovered blood in his bed from his left heel bleeding. He reports that he has had blisters on his left foot filled with fluid that has scabbed over put today the one started bleeding. Pt is diabetic. His back left lower leg is weeping and has blood coming through his sock

## 2021-09-03 NOTE — Consult Note (Signed)
PHARMACY -  BRIEF ANTIBIOTIC NOTE   Pharmacy has received consult(s) for vancomycin from an ED provider.  The patient's profile has been reviewed for ht/wt/allergies/indication/available labs.    One time order(s) placed for  --Vancomycin 1.5 g IV   Further antibiotics/pharmacy consults should be ordered by admitting physician if indicated.                       Thank you, Benita Gutter 09/03/2021  7:22 PM

## 2021-09-03 NOTE — Progress Notes (Addendum)
Update 2300: pt was admitted on the floor with no signs of distress. Pt alert x 4. VSS except BP at 172/96. IMDUR 30 mg tablet and hydralazine 50 mg tablet given at 2314. BP down at 143/79 HR 78 at 0100. Pt has a LEE wound. Wound care nurse consulted. Pt was educated about safety and ascom within pt reach. Will continue to monitor.  Pt has a 0.9 % sodium chloride order at 100 ml/hr, but per pt report has a hx of CHF. NP Randol Kern made aware. Will continue to monitor.  Update 1120: NP Randol Kern discontinue 0.9 % sodium chloride and ordered LR to run for 6 hours. Will continue to monitor.

## 2021-09-03 NOTE — Consult Note (Addendum)
Pharmacy Antibiotic Note  Nathan [name] is a 60 y.o. male with medical history including HTN, HLD, Afib, CKD stage IV, CHF, PVD admitted on 09/03/2021 with  LLE cellulitis . Patient has a history of MRSA infections. Pharmacy has been consulted for cefepime and vancomycin dosing.  Plan:  Cefepime 2 g IV q24h  Vancomycin 1.5 g IV LD x 1 --Will dose per levels for now given un-clear baseline Scr. Will evaluate for maintenance regimen once Scr stabilizes. Otherwise check a level in 1 -2 days --Daily Scr per protocol --Levels at steady state as clinically indicated  Height: 5\' 9"  (175.3 cm) Weight: 65.8 kg (145 lb) IBW/kg (Calculated) : 70.7  Temp (24hrs), Avg:97.8 F (36.6 C), Min:97.8 F (36.6 C), Max:97.8 F (36.6 C)  Recent Labs  Lab 09/03/21 1858  WBC 8.4  CREATININE 5.45*    Estimated Creatinine Clearance: 13.8 mL/min (A) (by C-G formula based on SCr of 5.45 mg/dL (H)).    No Known Allergies  Antimicrobials this admission: Vancomycin 12/15 >>  Cefepime 12/15 >>   Dose adjustments this admission: N/A  Microbiology results: 12/15 Wound Cx: Pending  Thank you for allowing pharmacy to be a part of this patients care.  Benita Gutter 09/03/2021 8:33 PM

## 2021-09-04 ENCOUNTER — Inpatient Hospital Stay: Payer: Medicaid Other

## 2021-09-04 DIAGNOSIS — D62 Acute posthemorrhagic anemia: Secondary | ICD-10-CM

## 2021-09-04 DIAGNOSIS — S81802A Unspecified open wound, left lower leg, initial encounter: Secondary | ICD-10-CM

## 2021-09-04 LAB — URINALYSIS, COMPLETE (UACMP) WITH MICROSCOPIC
Bilirubin Urine: NEGATIVE
Glucose, UA: 250 mg/dL — AB
Ketones, ur: NEGATIVE mg/dL
Leukocytes,Ua: NEGATIVE
Nitrite: NEGATIVE
Protein, ur: >300 mg/dL — AB
Specific Gravity, Urine: 1.02 (ref 1.005–1.030)
Squamous Epithelial / HPF: NONE SEEN (ref 0–5)
pH: 5 (ref 5.0–8.0)

## 2021-09-04 LAB — GLUCOSE, CAPILLARY
Glucose-Capillary: 98 mg/dL (ref 70–99)
Glucose-Capillary: 97 mg/dL (ref 70–99)
Glucose-Capillary: 145 mg/dL — ABNORMAL HIGH (ref 70–99)
Glucose-Capillary: 108 mg/dL — ABNORMAL HIGH (ref 70–99)
Glucose-Capillary: 153 mg/dL — ABNORMAL HIGH (ref 70–99)

## 2021-09-04 LAB — CBC
HCT: 21.5 % — ABNORMAL LOW (ref 39.0–52.0)
Hemoglobin: 6.9 g/dL — ABNORMAL LOW (ref 13.0–17.0)
MCH: 31.2 pg (ref 26.0–34.0)
MCHC: 32.1 g/dL (ref 30.0–36.0)
MCV: 97.3 fL (ref 80.0–100.0)
Platelets: 448 10*3/uL — ABNORMAL HIGH (ref 150–400)
RBC: 2.21 MIL/uL — ABNORMAL LOW (ref 4.22–5.81)
RDW: 12.9 % (ref 11.5–15.5)
WBC: 8.5 10*3/uL (ref 4.0–10.5)
nRBC: 0 % (ref 0.0–0.2)

## 2021-09-04 LAB — MRSA NEXT GEN BY PCR, NASAL: MRSA by PCR Next Gen: NOT DETECTED

## 2021-09-04 LAB — PREPARE RBC (CROSSMATCH)

## 2021-09-04 LAB — BASIC METABOLIC PANEL
Anion gap: 7 (ref 5–15)
BUN: 106 mg/dL — ABNORMAL HIGH (ref 6–20)
CO2: 21 mmol/L — ABNORMAL LOW (ref 22–32)
Calcium: 7.7 mg/dL — ABNORMAL LOW (ref 8.9–10.3)
Chloride: 114 mmol/L — ABNORMAL HIGH (ref 98–111)
Creatinine, Ser: 5.17 mg/dL — ABNORMAL HIGH (ref 0.61–1.24)
GFR, Estimated: 12 mL/min — ABNORMAL LOW (ref >60)
Glucose, Bld: 104 mg/dL — ABNORMAL HIGH (ref 70–99)
Potassium: 3.9 mmol/L (ref 3.5–5.1)
Sodium: 142 mmol/L (ref 135–145)

## 2021-09-04 LAB — SEDIMENTATION RATE: Sed Rate: 86 mm/hr — ABNORMAL HIGH (ref 0–20)

## 2021-09-04 LAB — C-REACTIVE PROTEIN: CRP: 0.6 mg/dL (ref <1.0)

## 2021-09-04 MED ORDER — SODIUM CHLORIDE 0.9% IV SOLUTION: Freq: Once | INTRAVENOUS | Status: AC

## 2021-09-04 MED ORDER — SODIUM CHLORIDE 0.9 % IV SOLN
1.0000 g | INTRAVENOUS | Status: DC
  Administered 2021-09-05 – 2021-09-07 (×3): 1 g via INTRAVENOUS
  Filled 2021-09-04 (×3): qty 1

## 2021-09-04 NOTE — Progress Notes (Signed)
PROGRESS NOTE    Nathan [name]  NID:[id number] DOB: 04/11/1961 DOA: 09/03/2021 PCP: Derinda Late, MD  Chief Complaint  Patient presents with   Foot Pain    Brief Narrative:  60 y.o. male with medical history significant for type 2 diabetes mellitus, hypertension, paroxysmal atrial fibrillation(he stopped taking Eliquis on his own due to bleeding), combined systolic and diastolic CHF and stage III chronic kidney disease, who presented to the ER with acute onset of bleeding from his left calf wound.      Assessment & Plan:   Principal Problem:   Left leg cellulitis Active Problems:   Non-healing wound of left lower extremity   Acute blood loss anemia   Acute kidney injury superimposed on CKD (HCC)   Acute on chronic HFrEF (heart failure with reduced ejection fraction) (HCC)   Atrial fibrillation, chronic (HCC)   Diabetes mellitus with diabetic neuropathy (HCC)   Essential hypertension   Hyperlipidemia   * Left leg cellulitis Continue abx, will narrow to vanc/ceftriaxone for now No crepitus, fluctuance, continue to monitor, low threshold for additional imaging ABI with noncompressible lower extremity arteries - monophasic waveforms bilaterally suggest bilateral LE PAD Palpable distal DP pulses, appreciate vascular assistance given his wound  Non-healing wound of left lower extremity Started with ruptured blister about 1 month ago Notably with peripheral neuropathy from below knees bilaterally  Appreciate wound care assistance and vascular assistance Vascular considering angiogram  Acute blood loss anemia Chronic, but likely worsened recently with bleeding from wound He's stopped his eliquis - on aspirin, indication not totally clear to me, will hold Follow anemia labs 1 unit pRBC today, follow   Acute kidney injury superimposed on CKD (HCC) AKI on CKD IV Baseline creatinine appears to be around 3.8 Presented with creatinine of 5.45 Renal US with medical renal  disease UA with >300 mg protein, 0-5 RBC's Appreciate renal assistance, they're considering dialysis   Acute on chronic HFrEF (heart failure with reduced ejection fraction) (HCC) Doesn't appear grossly overloaded at this time On coreg, imdur and hydralazine Lasix and metolazone on hold  Atrial fibrillation, chronic (HCC) eliquis on hold with bleeding, anemia  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  Antimicrobials:  Anti-infectives (From admission, onward)    Start     Dose/Rate Route Frequency Ordered Stop   09/05/21 0300  cefTRIAXone (ROCEPHIN) 1 g in sodium chloride 0.9 % 100 mL IVPB        1 g 200 mL/hr over 30 Minutes Intravenous Every 24 hours 09/04/21 1906 09/11/21 0259   09/03/21 2200  ceFEPIme (MAXIPIME) 2 g in sodium chloride 0.9 % 100 mL IVPB  Status:  Discontinued        2 g 200 mL/hr over 30 Minutes Intravenous Every 24 hours 09/03/21 2028 09/04/21 1906   09/03/21 2032  vancomycin variable dose per unstable renal function (pharmacist dosing)         Does not apply See admin instructions 09/03/21 2032     09/03/21 2030  vancomycin (VANCOCIN) IVPB 1000 mg/200 mL premix  Status:  Discontinued  1,000 mg 200 mL/hr over 60 Minutes Intravenous  Once 09/03/21 2028 09/03/21 2032   09/03/21 2000  vancomycin (VANCOREADY) IVPB 1500 mg/300 mL        1,500 mg 150 mL/hr over 120 Minutes Intravenous  Once 09/03/21 1921 09/04/21 0200       Subjective: No new complaints  Objective: Vitals:    09/04/21 1343 09/04/21 1345 09/04/21 1516 09/04/21 1630  BP: 124/67 124/67 128/68 132/71  Pulse: 73 73 75 73  Resp: 18 18 17 18   Temp: 98.1 F (36.7 C) 98.1 F (36.7 C) 98 F (36.7 C) 99 F (37.2 C)  TempSrc:  Oral  Oral  SpO2:  97% 98% 97%  Weight:      Height:        Intake/Output Summary (Last 24 hours) at 09/04/2021 1926 Last data filed at 09/04/2021 1630 Gross per 24 hour  Intake 1395.88 ml  Output 1000 ml  Net 395.88 ml   Filed Weights   09/03/21 1824  Weight: 65.8 kg    Examination:  General exam: Appears calm and comfortable  Respiratory system: unlabored Cardiovascular system: RRR Gastrointestinal system: Abdomen is nondistended, soft and nontende Central nervous system: Alert and oriented. No focal neurological deficits. Extremities: LLE with erythema surrounding ulceration to posterior calf - no crepitus, fluctuance, R>L edema Psychiatry: Judgement and insight appear normal. Mood & affect appropriate.     Data Reviewed: I have personally reviewed following labs and imaging studies  CBC: Recent Labs  Lab 09/03/21 1858 09/04/21 0424  WBC 8.4 8.5  NEUTROABS 6.6  --   HGB 7.4* 6.9*  HCT 22.7* 21.5*  MCV 99.6 97.3  PLT 535* 448*    Basic Metabolic Panel: Recent Labs  Lab 09/03/21 1858 09/04/21 0424  NA 141 142  K 3.9 3.9  CL 111 114*  CO2 21* 21*  GLUCOSE 229* 104*  BUN 101* 106*  CREATININE 5.45* 5.17*  CALCIUM 8.0* 7.7*    GFR: Estimated Creatinine Clearance: 14.5 mL/min (Angelli Baruch) (by C-G formula based on SCr of 5.17 mg/dL (H)).  Liver Function Tests: No results for input(s): AST, ALT, ALKPHOS, BILITOT, PROT, ALBUMIN in the last 168 hours.  CBG: Recent Labs  Lab 09/03/21 2306 09/04/21 0448 09/04/21 0743 09/04/21 1115 09/04/21 1605  GLUCAP 94 98 97 145* 108*     Recent Results (from the past 240 hour(s))  Resp Panel by RT-PCR (Flu Kenyana Husak&B, Covid) Nasopharyngeal Swab     Status: None   Collection Time: 09/03/21  8:30 PM    Specimen: Nasopharyngeal Swab; Nasopharyngeal(NP) swabs in vial transport medium  Result Value Ref Range Status   SARS Coronavirus 2 by RT PCR NEGATIVE NEGATIVE Final    Comment: (NOTE) SARS-CoV-2 target nucleic acids are NOT DETECTED.  The SARS-CoV-2 RNA is generally detectable in upper respiratory specimens during the acute phase of infection. The lowest concentration of SARS-CoV-2 viral copies this assay can detect is 138 copies/mL. Appollonia Klee negative result does not preclude SARS-Cov-2 infection and should not be used as the sole basis for treatment or other patient management decisions. Shaaron Golliday negative result may occur with  improper specimen collection/handling, submission of specimen other than nasopharyngeal swab, presence of viral mutation(s) within the areas targeted by this assay, and inadequate number of viral copies(<138 copies/mL). Qamar Aughenbaugh negative result must be combined with clinical observations, patient history, and epidemiological information. The expected result is Negative.  Fact Sheet for Patients:  EntrepreneurPulse.com.au  Fact Sheet for Healthcare Providers:  IncredibleEmployment.be  This test is no  t yet approved or cleared by the [place] and  has been authorized for detection and/or diagnosis of SARS-CoV-2 by FDA under an Emergency Use Authorization (EUA). This EUA will remain  in effect (meaning this test can be used) for the duration of the COVID-19 declaration under Section 564(b)(1) of the Act, 21 U.S.C.section 360bbb-3(b)(1), unless the authorization is terminated  or revoked sooner.       Influenza Lenus Trauger by PCR NEGATIVE NEGATIVE Final   Influenza B by PCR NEGATIVE NEGATIVE Final    Comment: (NOTE) The Xpert Xpress SARS-CoV-2/FLU/RSV plus assay is intended as an aid in the diagnosis of influenza from Nasopharyngeal swab specimens and should not be used as Majel Giel sole basis for treatment. Nasal washings and aspirates are  unacceptable for Xpert Xpress SARS-CoV-2/FLU/RSV testing.  Fact Sheet for Patients: EntrepreneurPulse.com.au  Fact Sheet for Healthcare Providers: IncredibleEmployment.be  This test is not yet approved or cleared by the [place] FDA and has been authorized for detection and/or diagnosis of SARS-CoV-2 by FDA under an Emergency Use Authorization (EUA). This EUA will remain in effect (meaning this test can be used) for the duration of the COVID-19 declaration under Section 564(b)(1) of the Act, 21 U.S.C. section 360bbb-3(b)(1), unless the authorization is terminated or revoked.  Performed at [facility], [address] [place], [place] [address]   MRSA Next Gen by PCR, Nasal     Status: None   Collection Time: 09/04/21 12:39 PM   Specimen: Nasal Mucosa; Nasal Swab  Result Value Ref Range Status   MRSA by PCR Next Gen NOT DETECTED NOT DETECTED Final    Comment: (NOTE) The GeneXpert MRSA Assay (FDA approved for NASAL specimens only), is one component of Sania Noy comprehensive MRSA colonization surveillance program. It is not intended to diagnose MRSA infection nor to guide or monitor treatment for MRSA infections. Test performance is not FDA approved in patients less than 68 years old. Performed at [facility], [address] [place], [place] [address]          Radiology Studies: DG Tibia/Fibula Left  Result Date: 09/03/2021 CLINICAL DATA:  Weeping of lower leg, bleeding wound at heel, blisters EXAM: LEFT TIBIA AND FIBULA - 2 VIEW COMPARISON:  None FINDINGS: Osseous demineralization. Knee and ankle joint alignments normal. No acute fracture, dislocation, or bone destruction. Diffuse soft tissue swelling greatest at ankle. Scattered atherosclerotic calcifications within runoff vessels. IMPRESSION: No acute osseous abnormalities. Electronically Signed   By: Lavonia Dana M.D.   On: 09/03/2021 19:12   US Renal  Result  Date: 09/03/2021 CLINICAL DATA:  Acute on chronic kidney disease EXAM: RENAL / URINARY TRACT ULTRASOUND COMPLETE COMPARISON:  12/03/2020 FINDINGS: Right Kidney: Renal measurements: 11.6 x 7.0 x 6.3 cm = volume: 271 mL. Echogenic renal parenchyma. Trace perinephric fluid. No mass or hydronephrosis. Left Kidney: Renal measurements: 10.7 x 6.5 x 5.5 cm = volume: 199 mL. Echogenic renal parenchyma. Trace perinephric fluid. No mass or hydronephrosis. Bladder: Appears normal for degree of bladder distention. Other: Small right pleural effusion. IMPRESSION: Echogenic renal parenchyma, suggesting medical renal disease. No hydronephrosis. Electronically Signed   By: Julian Hy M.D.   On: 09/03/2021 20:49   US Venous Img Lower Bilateral (DVT)  Result Date: 09/04/2021 CLINICAL DATA:  Bilateral lower extremity edema.  Evaluate for DVT. EXAM: BILATERAL LOWER EXTREMITY VENOUS DOPPLER ULTRASOUND TECHNIQUE: Gray-scale sonography with graded compression, as well as color Doppler and duplex ultrasound were performed to evaluate the lower extremity deep venous systems  from the level of the common femoral vein and including the common femoral, femoral, profunda femoral, popliteal and calf veins including the posterior tibial, peroneal and gastrocnemius veins when visible. The superficial great saphenous vein was also interrogated. Spectral Doppler was utilized to evaluate flow at rest and with distal augmentation maneuvers in the common femoral, femoral and popliteal veins. COMPARISON:  None. FINDINGS: RIGHT LOWER EXTREMITY Common Femoral Vein: No evidence of thrombus. Normal compressibility, respiratory phasicity and response to augmentation. Saphenofemoral Junction: No evidence of thrombus. Normal compressibility and flow on color Doppler imaging. Profunda Femoral Vein: No evidence of thrombus. Normal compressibility and flow on color Doppler imaging. Femoral Vein: No evidence of thrombus. Normal compressibility,  respiratory phasicity and response to augmentation. Popliteal Vein: No evidence of thrombus. Normal compressibility, respiratory phasicity and response to augmentation. Calf Veins: No evidence of thrombus. Normal compressibility and flow on color Doppler imaging. Superficial Great Saphenous Vein: No evidence of thrombus. Normal compressibility. Venous Reflux:  None. Other Findings: There is Kamarah Bilotta moderate amount of subcutaneous edema at the level the right calf (image 10). LEFT LOWER EXTREMITY Common Femoral Vein: No evidence of thrombus. Normal compressibility, respiratory phasicity and response to augmentation. Saphenofemoral Junction: No evidence of thrombus. Normal compressibility and flow on color Doppler imaging. Profunda Femoral Vein: No evidence of thrombus. Normal compressibility and flow on color Doppler imaging. Femoral Vein: No evidence of thrombus. Normal compressibility, respiratory phasicity and response to augmentation. Popliteal Vein: No evidence of thrombus. Normal compressibility, respiratory phasicity and response to augmentation. Calf Veins: No evidence of thrombus. Normal compressibility and flow on color Doppler imaging. Superficial Great Saphenous Vein: No evidence of thrombus. Normal compressibility. Venous Reflux:  None. Other Findings:  None. IMPRESSION: No evidence of DVT within either lower extremity. Electronically Signed   By: Sandi Mariscal M.D.   On: 09/04/2021 07:04   US ARTERIAL ABI (SCREENING LOWER EXTREMITY)  Result Date: 09/04/2021 CLINICAL DATA:  60 year old male with bilateral lower extremity edema. EXAM: NONINVASIVE PHYSIOLOGIC VASCULAR STUDY OF BILATERAL LOWER EXTREMITIES TECHNIQUE: Evaluation of both lower extremities were performed at rest, including calculation of ankle-brachial indices with single level Doppler, pressure and pulse volume recording. COMPARISON:  None. FINDINGS: Right ABI:  Noncompressible. Left ABI:  Noncompressible Right Lower Extremity:  Monophasic  arterial waveforms at the ankle. Left Lower Extremity:  Monophasic arterial waveforms at the ankle. IMPRESSION: Noncompressible lower extremity arteries at the level of the ankle, unable to calculate ankle-brachial indices. Monophasic waveforms bilaterally suggest bilateral lower extremity peripheral artery disease. Consider lower extremity duplex, CTA runoff, or direct angiography for further evaluation. Ruthann Cancer, MD [facility]iology Electronically Signed   By: Ruthann Cancer M.D.   On: 09/04/2021 10:19        Scheduled Meds:  atorvastatin  40 mg Oral QHS   carvedilol  25 mg Oral BID WC   ferrous sulfate  325 mg Oral Q breakfast   heparin injection (subcutaneous)  5,000 Units Subcutaneous Q8H   hydrALAZINE  50 mg Oral TID   insulin aspart  0-15 Units Subcutaneous Q4H   isosorbide mononitrate  30 mg Oral BID   multivitamin with minerals  1 tablet Oral Daily   vancomycin variable dose per unstable renal function (pharmacist dosing)   Does not apply See admin instructions   Continuous Infusions:  [START ON 09/05/2021] cefTRIAXone (ROCEPHIN)  IV       LOS: 1 day    Time spent: over 30 min    Fayrene Helper,  MD [facility]   To contact the attending provider between 7A-7P or the covering provider during after hours 7P-7A, please log into the web site [email or web address] and access using universal [place] password for that web site. If you do not have the password, please call the hospital operator.  09/04/2021, 7:26 PM

## 2021-09-04 NOTE — Assessment & Plan Note (Addendum)
AKI on CKD IV Baseline creatinine appears to be around 3.8 Presented with creatinine of 5.45 Creatinine 5.28 today Renal US with medical renal disease UA with >300 mg protein, 0-5 RBC's Appreciate renal assistance, they're considering dialysis -s/p PD catheter placement by general surgery 12/20

## 2021-09-04 NOTE — Assessment & Plan Note (Signed)
Coreg, hydralazine, imdur

## 2021-09-04 NOTE — Assessment & Plan Note (Addendum)
Bilateral peripheral neuropathy below knees A1c 02/2021 5.6, follow SSI

## 2021-09-04 NOTE — Assessment & Plan Note (Addendum)
Narrow to ancef with improvement  improving No crepitus, fluctuance, continue to monitor, low threshold for additional imaging ABI with noncompressible lower extremity arteries - monophasic waveforms bilaterally suggest bilateral LE PAD S/p aortogram and left lower extremity angiogram - no focal stenosis creating flow limitation Palpable distal DP pulses, appreciate vascular assistance given his wound Plan for outpatient follow up with [place]

## 2021-09-04 NOTE — Assessment & Plan Note (Addendum)
eliquis on hold with bleeding, anemia (he notes he hasn't taken eliquis for maybe 6 months) Follow with PCP outaptient

## 2021-09-04 NOTE — Assessment & Plan Note (Signed)
Doesn't appear grossly overloaded at this time On coreg, imdur and hydralazine Lasix and metolazone on hold

## 2021-09-04 NOTE — Assessment & Plan Note (Addendum)
Chronic, but likely worsened recently with bleeding from wound He's stopped his eliquis - on aspirin, indication not totally clear to me, will hold S/p 1 unit pRBC - inappropriate bump stable  Anemia labs c/w AOCD Follow H/H post surgery

## 2021-09-04 NOTE — Assessment & Plan Note (Addendum)
Started with ruptured blister about 1 month ago Notably with peripheral neuropathy from below knees bilaterally  Appreciate wound care assistance and vascular assistance Vascular considering angiogram (as above) Wound care center outpatient

## 2021-09-04 NOTE — TOC Initial Note (Signed)
Transition of Care [facility]) - Initial/Assessment Note    Patient Details  Name: Nathan [name] MRN: [id number] Date of Birth: 09-25-1960  Transition of Care [facility]) CM/SW Contact:    Pete Pelt, RN Phone Number: 09/04/2021, 1:45 PM  Clinical Narrative:   Patient lives at home with his wife who can assist him with care and drive him to appointments and the pharmacy.  Patient is able to take medications as directed.    Patient sometimes walks with a cane and does not have home health.  He has applied for Medicaid and disability, but had not heard verdict on either.  Financial services made aware to check medicaid status.  Patient states he has a PCP and sees the doctor as needed.  Patient feels comfortable returning home once he is medically cleared for discharge.                Expected Discharge Plan: Home/Self Care Barriers to Discharge: Continued Medical Work up   Patient Goals and CMS Choice        Expected Discharge Plan and Services Expected Discharge Plan: Home/Self Care   Discharge Planning Services: CM Consult   Living arrangements for the past 2 months: Single Family Home                                      Prior Living Arrangements/Services Living arrangements for the past 2 months: Single Family Home Lives with:: Self, Spouse Patient language and need for interpreter reviewed:: Yes (no interpreter required) Do you feel safe going back to the place where you live?: Yes      Need for Family Participation in Patient Care: Yes (Comment)   Current home services: DME Criminal Activity/Legal Involvement Pertinent to Current Situation/Hospitalization: No - Comment as needed  Activities of Daily Living Home Assistive Devices/Equipment: Cane (specify quad or straight) (1 prong) ADL Screening (condition at time of admission) Patient's cognitive ability adequate to safely complete daily activities?: Yes Is the patient deaf or have difficulty hearing?:  Yes Does the patient have difficulty seeing, even when wearing glasses/contacts?: No Does the patient have difficulty concentrating, remembering, or making decisions?: No Patient able to express need for assistance with ADLs?: Yes Does the patient have difficulty dressing or bathing?: Yes Independently performs ADLs?: Yes (appropriate for developmental age) Does the patient have difficulty walking or climbing stairs?: Yes Weakness of Legs: Both Weakness of Arms/Hands: None  Permission Sought/Granted Permission sought to share information with : Case Manager Permission granted to share information with : Yes, Verbal Permission Granted              Emotional Assessment Appearance:: Appears stated age Attitude/Demeanor/Rapport: Engaged, Gracious Affect (typically observed): Pleasant, Appropriate Orientation: : Oriented to Self, Oriented to Place, Oriented to  Time, Oriented to Situation Alcohol / Substance Use: Not Applicable Psych Involvement: No (comment)  Admission diagnosis:  Hyperglycemia [R73.9] Cellulitis of left lower extremity [L03.116] AKI (acute kidney injury) ([place]) [N17.9] Left leg cellulitis [Y63.785] Patient Active Problem List   Diagnosis Date Noted   Left leg cellulitis 09/03/2021   Malnutrition of moderate degree 03/17/2021   Acute exacerbation of CHF (congestive heart failure) ([place]) 03/12/2021   Hyperlipidemia 03/12/2021   Proteinuria    CKD stage 4 due to type 2 diabetes mellitus (HCC)    PAF (paroxysmal atrial fibrillation) ([place])    Diabetes mellitus without complication ([place])  Acute kidney injury superimposed on CKD ([place])    Hydropneumothorax 06/24/2019   Essential hypertension 06/24/2019   Insulin dependent diabetes mellitus type IA ([employer]) 06/24/2019   Anemia of chronic disease 06/24/2019   Atrial fibrillation, chronic ([place]) 06/24/2019   Acute on chronic HFrEF (heart failure with reduced ejection fraction) ([place]) 06/24/2019   Cellulitis of right hand  06/24/2019   CKD (chronic kidney disease), stage III ([place])    Empyema ([place])    Sepsis ([place]) 06/10/2019   PCP:  Derinda Late, MD Pharmacy:   [facility] [address] (N), [place]) [place] [address] Phone: [phone] Fax: [phone]     Social Determinants of Health (SDOH) Interventions    Readmission Risk Interventions No flowsheet data found.

## 2021-09-04 NOTE — Consult Note (Signed)
[place] Nurse wound consult note Consultation was completed by review of records, images and assistance from the bedside nurse/clinical staff.  Reason for Consult: LLE wound Patient with significant history of PAD, DM, CHF Wound type: full thickness skin ulceration LLE posterior calf; ? Ruptured blister Pressure Injury POA: NA Measurement: 12cm x 6cm x 0.2cm  Wound bed:95% yellow slough with some darkening at wound edges  Drainage (amount, consistency, odor) serosanguinous  Periwound: edema, erythema  Dressing procedure/placement/frequency:  Single layer of xeroform over the wound, top with ABD pads. Wrap with kerlix.  Due to the patient's PAD would not feel comfortable at this time adding debridement agent. Consider vascular consult  Re consult if needed, will not follow at this time. Thanks  Johnanna Bakke [employer], RN,CWOCN, CNS, [place] [phone])

## 2021-09-04 NOTE — Assessment & Plan Note (Signed)
statin

## 2021-09-04 NOTE — Plan of Care (Signed)
°  Problem: Education: Goal: Knowledge of General Education information will improve Description: Including pain rating scale, medication(s)/side effects and non-pharmacologic comfort measures Outcome: Progressing   Problem: Clinical Measurements: Goal: Will remain free from infection Outcome: Progressing   Problem: Clinical Measurements: Goal: Respiratory complications will improve Outcome: Progressing   Problem: Clinical Measurements: Goal: Cardiovascular complication will be avoided Outcome: Progressing   Problem: Elimination: Goal: Will not experience complications related to urinary retention Outcome: Progressing   Problem: Pain Managment: Goal: General experience of comfort will improve Outcome: Progressing   Problem: Safety: Goal: Ability to remain free from injury will improve Outcome: Progressing

## 2021-09-04 NOTE — Progress Notes (Addendum)
[facility]  ROUNDING NOTE   Subjective:   Nathan [name] is a 60 year old male with past medical conditions including diabetes, paroxysmal A. fib, diabetes, systolic and diastolic chronic heart failure, hypertension, and chronic kidney disease stage IV.  Patient reports to the emergency department with complaints of bleeding from his left calf wound.  Patient has been admitted for Hyperglycemia [R73.9] Cellulitis of left lower extremity [L03.116] AKI (acute kidney injury) ([place]) [N17.9] Left leg cellulitis [U72.536]  Patient is known to our office and is followed Dr. Lanora Manis.  He was last seen in the office March 25, 2021 for routine follow-up.  He states the wound on his left lower leg originated as a ruptured blister about a month ago.  He states he has not been using any dressings over the wound.  Does report soreness and drainage from wound.  Denies fever, chest pain, shortness of breath.  Reports nausea and some vomiting.  Denies loss of appetite.  Currently eating breakfast.  Labs on arrival include glucose 229, sodium 141, potassium 3.9, BUN 101, creatinine 5.45 with GFR 11, hemoglobin 7.4.  Renal ultrasound negative for obstruction.  Left tib-fib x-ray negative for bony changes.  Lower extremity ultrasound negative for DVT.  ABI ultrasound inconclusive, recommend angiogram.  We have been consulted to evaluate acute kidney injury.   Objective:  Vital signs in last 24 hours:  Temp:  [97.5 F (36.4 C)-98.3 F (36.8 C)] 97.7 F (36.5 C) (12/16 1300) Pulse Rate:  [71-86] 74 (12/16 1300) Resp:  [16-20] 18 (12/16 1300) BP: (110-174)/(61-96) 124/73 (12/16 1300) SpO2:  [97 %-99 %] 97 % (12/16 1300) Weight:  [65.8 kg] 65.8 kg (12/15 1824)  Weight change:  Filed Weights   09/03/21 1824  Weight: 65.8 kg    Intake/Output: I/O last 3 completed shifts: In: 1031.9 [I.V.:633.7; IV Piggyback:398.2] Out: 400 [Urine:400]   Intake/Output this shift:  Total I/O In: 0   Out: 300 [Urine:300]  Physical Exam: General: NAD, resting in bed  Head: Normocephalic, atraumatic. Moist oral mucosal membranes  Eyes: Anicteric  Lungs:  Clear to auscultation, normal effort, room air  Heart: Regular rate and rhythm  Abdomen:  Soft, nontender, nondistended  Extremities: No peripheral edema.  Neurologic: Nonfocal, moving all four extremities  Skin: LLE wound       Basic Metabolic Panel: Recent Labs  Lab 09/03/21 1858 09/04/21 0424  NA 141 142  K 3.9 3.9  CL 111 114*  CO2 21* 21*  GLUCOSE 229* 104*  BUN 101* 106*  CREATININE 5.45* 5.17*  CALCIUM 8.0* 7.7*    Liver Function Tests: No results for input(s): AST, ALT, ALKPHOS, BILITOT, PROT, ALBUMIN in the last 168 hours. No results for input(s): LIPASE, AMYLASE in the last 168 hours. No results for input(s): AMMONIA in the last 168 hours.  CBC: Recent Labs  Lab 09/03/21 1858 09/04/21 0424  WBC 8.4 8.5  NEUTROABS 6.6  --   HGB 7.4* 6.9*  HCT 22.7* 21.5*  MCV 99.6 97.3  PLT 535* 448*    Cardiac Enzymes: No results for input(s): CKTOTAL, CKMB, CKMBINDEX, TROPONINI in the last 168 hours.  BNP: Invalid input(s): POCBNP  CBG: Recent Labs  Lab 09/03/21 2019 09/03/21 2306 09/04/21 0448 09/04/21 0743 09/04/21 1115  GLUCAP 171* 94 98 97 145*    Microbiology: Results for orders placed or performed during the hospital encounter of 09/03/21  Resp Panel by RT-PCR (Flu A&B, Covid) Nasopharyngeal Swab     Status: None   Collection Time:  09/03/21  8:30 PM   Specimen: Nasopharyngeal Swab; Nasopharyngeal(NP) swabs in vial transport medium  Result Value Ref Range Status   SARS Coronavirus 2 by RT PCR NEGATIVE NEGATIVE Final    Comment: (NOTE) SARS-CoV-2 target nucleic acids are NOT DETECTED.  The SARS-CoV-2 RNA is generally detectable in upper respiratory specimens during the acute phase of infection. The lowest concentration of SARS-CoV-2 viral copies this assay can detect is 138 copies/mL. A  negative result does not preclude SARS-Cov-2 infection and should not be used as the sole basis for treatment or other patient management decisions. A negative result may occur with  improper specimen collection/handling, submission of specimen other than nasopharyngeal swab, presence of viral mutation(s) within the areas targeted by this assay, and inadequate number of viral copies(<138 copies/mL). A negative result must be combined with clinical observations, patient history, and epidemiological information. The expected result is Negative.  Fact Sheet for Patients:  EntrepreneurPulse.com.au  Fact Sheet for Healthcare Providers:  IncredibleEmployment.be  This test is no t yet approved or cleared by the [place] FDA and  has been authorized for detection and/or diagnosis of SARS-CoV-2 by FDA under an Emergency Use Authorization (EUA). This EUA will remain  in effect (meaning this test can be used) for the duration of the COVID-19 declaration under Section 564(b)(1) of the Act, 21 U.S.C.section 360bbb-3(b)(1), unless the authorization is terminated  or revoked sooner.       Influenza A by PCR NEGATIVE NEGATIVE Final   Influenza B by PCR NEGATIVE NEGATIVE Final    Comment: (NOTE) The Xpert Xpress SARS-CoV-2/FLU/RSV plus assay is intended as an aid in the diagnosis of influenza from Nasopharyngeal swab specimens and should not be used as a sole basis for treatment. Nasal washings and aspirates are unacceptable for Xpert Xpress SARS-CoV-2/FLU/RSV testing.  Fact Sheet for Patients: EntrepreneurPulse.com.au  Fact Sheet for Healthcare Providers: IncredibleEmployment.be  This test is not yet approved or cleared by the [place] FDA and has been authorized for detection and/or diagnosis of SARS-CoV-2 by FDA under an Emergency Use Authorization (EUA). This EUA will remain in effect (meaning this test can  be used) for the duration of the COVID-19 declaration under Section 564(b)(1) of the Act, 21 U.S.C. section 360bbb-3(b)(1), unless the authorization is terminated or revoked.  Performed at [facility], [address] [place], [place] [address]     Coagulation Studies: No results for input(s): LABPROT, INR in the last 72 hours.  Urinalysis: Recent Labs    09/03/21 0723  COLORURINE YELLOW  LABSPEC 1.020  PHURINE 5.0  GLUCOSEU 250*  HGBUR TRACE*  BILIRUBINUR NEGATIVE  KETONESUR NEGATIVE  PROTEINUR >300*  NITRITE NEGATIVE  LEUKOCYTESUR NEGATIVE      Imaging: DG Tibia/Fibula Left  Result Date: 09/03/2021 CLINICAL DATA:  Weeping of lower leg, bleeding wound at heel, blisters EXAM: LEFT TIBIA AND FIBULA - 2 VIEW COMPARISON:  None FINDINGS: Osseous demineralization. Knee and ankle joint alignments normal. No acute fracture, dislocation, or bone destruction. Diffuse soft tissue swelling greatest at ankle. Scattered atherosclerotic calcifications within runoff vessels. IMPRESSION: No acute osseous abnormalities. Electronically Signed   By: Lavonia Dana M.D.   On: 09/03/2021 19:12   US Renal  Result Date: 09/03/2021 CLINICAL DATA:  Acute on chronic kidney disease EXAM: RENAL / URINARY TRACT ULTRASOUND COMPLETE COMPARISON:  12/03/2020 FINDINGS: Right Kidney: Renal measurements: 11.6 x 7.0 x 6.3 cm = volume: 271 mL. Echogenic renal parenchyma. Trace perinephric fluid. No mass or hydronephrosis. Left Kidney: Renal measurements:  10.7 x 6.5 x 5.5 cm = volume: 199 mL. Echogenic renal parenchyma. Trace perinephric fluid. No mass or hydronephrosis. Bladder: Appears normal for degree of bladder distention. Other: Small right pleural effusion. IMPRESSION: Echogenic renal parenchyma, suggesting medical renal disease. No hydronephrosis. Electronically Signed   By: Julian Hy M.D.   On: 09/03/2021 20:49   US Venous Img Lower Bilateral (DVT)  Result Date: 09/04/2021 CLINICAL  DATA:  Bilateral lower extremity edema.  Evaluate for DVT. EXAM: BILATERAL LOWER EXTREMITY VENOUS DOPPLER ULTRASOUND TECHNIQUE: Gray-scale sonography with graded compression, as well as color Doppler and duplex ultrasound were performed to evaluate the lower extremity deep venous systems from the level of the common femoral vein and including the common femoral, femoral, profunda femoral, popliteal and calf veins including the posterior tibial, peroneal and gastrocnemius veins when visible. The superficial great saphenous vein was also interrogated. Spectral Doppler was utilized to evaluate flow at rest and with distal augmentation maneuvers in the common femoral, femoral and popliteal veins. COMPARISON:  None. FINDINGS: RIGHT LOWER EXTREMITY Common Femoral Vein: No evidence of thrombus. Normal compressibility, respiratory phasicity and response to augmentation. Saphenofemoral Junction: No evidence of thrombus. Normal compressibility and flow on color Doppler imaging. Profunda Femoral Vein: No evidence of thrombus. Normal compressibility and flow on color Doppler imaging. Femoral Vein: No evidence of thrombus. Normal compressibility, respiratory phasicity and response to augmentation. Popliteal Vein: No evidence of thrombus. Normal compressibility, respiratory phasicity and response to augmentation. Calf Veins: No evidence of thrombus. Normal compressibility and flow on color Doppler imaging. Superficial Great Saphenous Vein: No evidence of thrombus. Normal compressibility. Venous Reflux:  None. Other Findings: There is a moderate amount of subcutaneous edema at the level the right calf (image 10). LEFT LOWER EXTREMITY Common Femoral Vein: No evidence of thrombus. Normal compressibility, respiratory phasicity and response to augmentation. Saphenofemoral Junction: No evidence of thrombus. Normal compressibility and flow on color Doppler imaging. Profunda Femoral Vein: No evidence of thrombus. Normal compressibility  and flow on color Doppler imaging. Femoral Vein: No evidence of thrombus. Normal compressibility, respiratory phasicity and response to augmentation. Popliteal Vein: No evidence of thrombus. Normal compressibility, respiratory phasicity and response to augmentation. Calf Veins: No evidence of thrombus. Normal compressibility and flow on color Doppler imaging. Superficial Great Saphenous Vein: No evidence of thrombus. Normal compressibility. Venous Reflux:  None. Other Findings:  None. IMPRESSION: No evidence of DVT within either lower extremity. Electronically Signed   By: Sandi Mariscal M.D.   On: 09/04/2021 07:04   US ARTERIAL ABI (SCREENING LOWER EXTREMITY)  Result Date: 09/04/2021 CLINICAL DATA:  60 year old male with bilateral lower extremity edema. EXAM: NONINVASIVE PHYSIOLOGIC VASCULAR STUDY OF BILATERAL LOWER EXTREMITIES TECHNIQUE: Evaluation of both lower extremities were performed at rest, including calculation of ankle-brachial indices with single level Doppler, pressure and pulse volume recording. COMPARISON:  None. FINDINGS: Right ABI:  Noncompressible. Left ABI:  Noncompressible Right Lower Extremity:  Monophasic arterial waveforms at the ankle. Left Lower Extremity:  Monophasic arterial waveforms at the ankle. IMPRESSION: Noncompressible lower extremity arteries at the level of the ankle, unable to calculate ankle-brachial indices. Monophasic waveforms bilaterally suggest bilateral lower extremity peripheral artery disease. Consider lower extremity duplex, CTA runoff, or direct angiography for further evaluation. Ruthann Cancer, MD [facility] Electronically Signed   By: Ruthann Cancer M.D.   On: 09/04/2021 10:19     Medications:    ceFEPime (MAXIPIME) IV Stopped (09/04/21 0301)    aspirin EC  81 mg Oral  Daily   atorvastatin  40 mg Oral QHS   carvedilol  25 mg Oral BID WC   ferrous sulfate  325 mg Oral Q breakfast   heparin injection  (subcutaneous)  5,000 Units Subcutaneous Q8H   hydrALAZINE  50 mg Oral TID   insulin aspart  0-15 Units Subcutaneous Q4H   isosorbide mononitrate  30 mg Oral BID   multivitamin with minerals  1 tablet Oral Daily   vancomycin variable dose per unstable renal function (pharmacist dosing)   Does not apply See admin instructions   acetaminophen **OR** acetaminophen, morphine injection, ondansetron **OR** ondansetron (ZOFRAN) IV, traZODone  Assessment/ Plan:  Nathan [name] is a 60 y.o.  male with past medical conditions including diabetes, paroxysmal A. fib, diabetes, systolic and diastolic chronic heart failure, hypertension, and chronic kidney disease stage IV.  Patient reports to the emergency department with complaints of bleeding from his left calf wound.  Patient has been admitted for Hyperglycemia [R73.9] Cellulitis of left lower extremity [L03.116] AKI (acute kidney injury) ([place]) [N17.9] Left leg cellulitis [O16.073]   Acute Kidney Injury on chronic kidney disease stage IV with baseline creatinine 3.98 and GFR of 16 on 03/25/21.  Acute kidney injury secondary likely to aggressive diuresis Continue to hold diuretics Renal ultrasound negative for obstruction, No IV contrast exposure Patient has been on aggressive diuresis outpatient, furosemide 80 mg twice daily and metolazone 2.5 mg twice daily.  Baseline reported above is from July nephrology appointment.  Discussed the possible need for renal replacement therapy with patient.  Patient will discuss with spouse.  We will continue to monitor at this time.  We will reassess in a.m. Continue to avoid nephrotoxic agents and therapies if possible.  Lab Results  Component Value Date   CREATININE 5.17 (H) 09/04/2021   CREATININE 5.45 (H) 09/03/2021   CREATININE 3.86 (H) 03/18/2021    Intake/Output Summary (Last 24 hours) at 09/04/2021 1345 Last data filed at 09/04/2021 1205 Gross per 24 hour  Intake 1031.88 ml  Output 700 ml   Net 331.88 ml   2. Anemia of chronic kidney disease Lab Results  Component Value Date   HGB 6.9 (L) 09/04/2021    Hemoglobin below target.  Will defer blood transfusion to primary team.  3. Secondary Hyperparathyroidism: with outpatient labs: phosphorus 5.5, calcium 7.8 on 03/25/21.   Lab Results  Component Value Date   CALCIUM 7.7 (L) 09/04/2021   CAION 1.05 (L) 06/26/2019   PHOS 3.6 06/30/2019    Corrected calcium of 9.  Phosphorus within desired target.  4.  Chronic systolic heart failure.  Echo from 03/14/2021 shows EF 40 to 45% with a grade 2 diastolic dysfunction and mild LVH.  Placed on furosemide and metolazone (outpatient) as above for fluid management.  5. LLE cellulitis, Wound culture obtained Vascular consulted for angiogram Receiving Cefepime and Vanc   LOS: 1 Jakhai Fant 12/16/20221:45 PM

## 2021-09-04 NOTE — Hospital Course (Addendum)
60 y.o. male with medical history significant for type 2 diabetes mellitus, hypertension, paroxysmal atrial fibrillation(he stopped taking Eliquis on his own due to bleeding), combined systolic and diastolic CHF and stage III chronic kidney disease, who presented to the ER with acute onset of bleeding from his left calf wound with cellulitis.  He's now undergone angioplasty by vascular notable for calcific PAD, but no focal stenosis.  His cellulitis and wound appear to be gradually improving with therapy.  Plan is for discharge on abx and outpatient wound center follow up.  His hospitalization was complicated by AKI on CKD and he was seen by nephrology and plan is to start PD.  Surgery placed PD catheter 12/20.  Possible discharge in AM with outpatient nephrology, surgery, and wound care follow up pending stability and therapy recs.

## 2021-09-04 NOTE — Consult Note (Signed)
[facility] Consult Note  MRN : [id number]  Nathan [name] is a 60 y.o. (1961-07-02) male who presents with chief complaint of  Chief Complaint  Patient presents with   Foot Pain   History of Present Illness:  Nathan [name] is a 60 y.o. male with medical history significant for type 2 diabetes mellitus, hypertension, paroxysmal atrial fibrillation (he stopped taking Eliquis on his own due to bleeding), combined systolic and diastolic CHF and stage III chronic kidney disease, who presented to the ER with acute onset of bleeding from his left calf wound.    He has been having worsening erythema and and had a ruptured blister over his medial calf about a month ago.  He admitted to mild associated tenderness.  He has not been using any dressings.  He does not follow with any wound clinic.  No recent trauma.    He denies any fever or chills.  No chest pain or dyspnea or wheezing.  He has been having cough and occasional posttussive vomiting that is nonbloody and nonbilious..  No abdominal pain melena or bright red bleeding per rectum.  No dysuria, oliguria, urinary frequency or urgency or flank pain.  He denies any poor appetite.    Imaging: X-ray of the left tibia and fibula reveal no acute osseous abnormalities.  Renal ultrasound is currently pending.  Vascular surgery was consulted by Dr. Florene Glen in the setting of a nonhealing ulceration with multiple risk factors for atherosclerotic disease.  Current Facility-Administered Medications  Medication Dose Route Frequency Provider Last Rate Last Admin   acetaminophen (TYLENOL) tablet 650 mg  650 mg Oral Q6H PRN Mansy, Jan A, MD       Or   acetaminophen (TYLENOL) suppository 650 mg  650 mg Rectal Q6H PRN Mansy, Arvella Merles, MD       aspirin EC tablet 81 mg  81 mg Oral Daily Mansy, Jan A, MD   81 mg at 09/04/21 1032   atorvastatin (LIPITOR) tablet 40 mg  40 mg Oral QHS Mansy, Jan A, MD   40 mg at 09/03/21 2314    carvedilol (COREG) tablet 25 mg  25 mg Oral BID WC Mansy, Jan A, MD   25 mg at 09/04/21 0801   ceFEPIme (MAXIPIME) 2 g in sodium chloride 0.9 % 100 mL IVPB  2 g Intravenous Q24H Mansy, Jan A, MD   Stopped at 09/04/21 0301   ferrous sulfate tablet 325 mg  325 mg Oral Q breakfast Mansy, Jan A, MD   325 mg at 09/04/21 0801   heparin injection 5,000 Units  5,000 Units Subcutaneous Q8H Mansy, Jan A, MD   5,000 Units at 09/04/21 1352   hydrALAZINE (APRESOLINE) tablet 50 mg  50 mg Oral TID Mansy, Jan A, MD   50 mg at 09/04/21 1550   insulin aspart (novoLOG) injection 0-15 Units  0-15 Units Subcutaneous Q4H Lucrezia Starch, MD   2 Units at 09/04/21 1233   isosorbide mononitrate (IMDUR) 24 hr tablet 30 mg  30 mg Oral BID Mansy, Jan A, MD   30 mg at 09/04/21 1032   morphine 2 MG/ML injection 2 mg  2 mg Intravenous Q4H PRN Mansy, Jan A, MD       multivitamin with minerals tablet 1 tablet  1 tablet Oral Daily Mansy, Jan A, MD   1 tablet at 09/04/21 1032   ondansetron (ZOFRAN) tablet 4 mg  4 mg Oral Q6H PRN Mansy, Arvella Merles, MD  Or   ondansetron (ZOFRAN) injection 4 mg  4 mg Intravenous Q6H PRN Mansy, Jan A, MD       traZODone (DESYREL) tablet 25 mg  25 mg Oral QHS PRN Mansy, Jan A, MD       vancomycin variable dose per unstable renal function (pharmacist dosing)   Does not apply See admin instructions [facility]       Past Medical History:  Diagnosis Date   Anemia of chronic disease    Chronic combined systolic (congestive) and diastolic (congestive) heart failure ([place])    a. 05/2019 Echo: EF 45-50%, nl LA/RA size. Mild to mod TR.   CKD (chronic kidney disease), stage III ([place])    Diabetes mellitus without complication ([place])    Hypertension    Left Hydropneumothorax/Fibrinopurulent empyema    a. 06/2019 s/p VATS.   PAF (paroxysmal atrial fibrillation) ([place])    a. 05/2019 in setting of sepsis-->s/p TEE/DCCV; b. CHA2DS2VASc = 2-->Eliquis.   Sepsis ([place])    a. 05/2019 MRSA in setting of  infected R thumb.   Past Surgical History:  Procedure Laterality Date   HAND SURGERY Right    I & D EXTREMITY Right 06/11/2019   Procedure: IRRIGATION AND DEBRIDEMENT RIGHT THUMB;  Surgeon: Dereck Leep, MD;  Location: [facility];  Service: Orthopedics;  Laterality: Right;   TEE WITHOUT CARDIOVERSION N/A 06/14/2019   Procedure: TRANSESOPHAGEAL ECHOCARDIOGRAM (TEE);  Surgeon: Minna Merritts, MD;  Location: [facility];  Service: Cardiovascular;  Laterality: N/A;   VIDEO ASSISTED THORACOSCOPY (VATS)/THOROCOTOMY Left 06/26/2019   Procedure: VIDEO ASSISTED THORACOSCOPY DECORTICATION;  Surgeon: Lajuana Matte, MD;  Location: [place];  Service: Thoracic;  Laterality: Left;   VIDEO BRONCHOSCOPY N/A 06/26/2019   Procedure: VIDEO BRONCHOSCOPY;  Surgeon: Lajuana Matte, MD;  Location: [facility];  Service: Thoracic;  Laterality: N/A;   Social History Social History   Tobacco Use   Smoking status: Never   Smokeless tobacco: Never  Vaping Use   Vaping Use: Never used  Substance Use Topics   Alcohol use: Never   Drug use: Never   Family History Family History  Problem Relation Age of Onset   CAD Father    Diabetes Sister    Diabetes Brother    Diabetes Sister    Diabetes Sister   Denies family history of peripheral artery disease, venous disease or renal disease.  No Known Allergies  REVIEW OF SYSTEMS (Negative unless checked)  Constitutional: [] Weight loss  [] Fever  [] Chills Cardiac: [] Chest pain   [] Chest pressure   [] Palpitations   [] Shortness of breath when laying flat   [] Shortness of breath at rest   [x] Shortness of breath with exertion. Vascular:  [] Pain in legs with walking   [] Pain in legs at rest   [] Pain in legs when laying flat   [] Claudication   [] Pain in feet when walking  [] Pain in feet at rest  [] Pain in feet when laying flat   [] History of DVT   [] Phlebitis   [] Swelling in legs   [] Varicose veins   [x] Non-healing ulcers Pulmonary:   [] Uses home oxygen   [] Productive  cough   [] Hemoptysis   [] Wheeze  [] COPD   [] Asthma Neurologic:  [] Dizziness  [] Blackouts   [] Seizures   [] History of stroke   [] History of TIA  [] Aphasia   [] Temporary blindness   [] Dysphagia   [] Weakness or numbness in arms   [] Weakness or numbness in legs Musculoskeletal:  [] Arthritis   [] Joint swelling   []   Joint pain   [] Low back pain Hematologic:  [] Easy bruising  [] Easy bleeding   [] Hypercoagulable state   [] Anemic  [] Hepatitis Gastrointestinal:  [] Blood in stool   [] Vomiting blood  [] Gastroesophageal reflux/heartburn   [] Difficulty swallowing. Genitourinary:  [x] Chronic kidney disease   [] Difficult urination  [] Frequent urination  [] Burning with urination   [] Blood in urine Skin:  [] Rashes   [] Ulcers   [] Wounds Psychological:  [] History of anxiety   []  History of major depression.  Physical Examination  Vitals:   09/04/21 1300 09/04/21 1343 09/04/21 1345 09/04/21 1516  BP: 124/73 124/67 124/67 128/68  Pulse: 74 73 73 75  Resp: 18 18 18 17   Temp: 97.7 F (36.5 C) 98.1 F (36.7 C) 98.1 F (36.7 C) 98 F (36.7 C)  TempSrc: Oral  Oral   SpO2: 97%  97% 98%  Weight:      Height:       Body mass index is 21.41 kg/m. Gen:  WD/WN, NAD Head: [place]/AT, No temporalis wasting. Prominent temp pulse not noted. Ear/Nose/Throat: Hearing grossly intact, nares w/o erythema or drainage, oropharynx w/o Erythema/Exudate Eyes: Sclera non-icteric, conjunctiva clear Neck: Trachea midline.  No JVD.  Pulmonary:  Good air movement, respirations not labored, equal bilaterally.  Cardiac: RRR, normal S1, S2. Vascular:  Vessel Right Left  Radial Palpable Palpable  Ulnar Palpable Palpable  Brachial Palpable Palpable  Carotid Palpable, without bruit Palpable, without bruit  Aorta Not palpable N/A  Femoral Palpable Palpable  Popliteal Palpable Palpable  PT Palpable Non-Palpable  DP Palpable Non-Palpable   Left lower extremity: Large wound located to the back of the patient's calf.  Large area of  necrotic tissue.  Hard to palpate pedal pulses.  Gastrointestinal: soft, non-tender/non-distended. No guarding/reflex.  Musculoskeletal: M/S 5/5 throughout.  Extremities without ischemic changes.  No deformity or atrophy. No edema. Neurologic: Sensation grossly intact in extremities.  Symmetrical.  Speech is fluent. Motor exam as listed above. Psychiatric: Judgment intact, Mood & affect appropriate for pt's clinical situation. Dermatologic: As above  Lymph : No Cervical, Axillary, or Inguinal lymphadenopathy.  CBC Lab Results  Component Value Date   WBC 8.5 09/04/2021   HGB 6.9 (L) 09/04/2021   HCT 21.5 (L) 09/04/2021   MCV 97.3 09/04/2021   PLT 448 (H) 09/04/2021   BMET    Component Value Date/Time   NA 142 09/04/2021 0424   NA 143 02/19/2020 0759   K 3.9 09/04/2021 0424   CL 114 (H) 09/04/2021 0424   CO2 21 (L) 09/04/2021 0424   GLUCOSE 104 (H) 09/04/2021 0424   BUN 106 (H) 09/04/2021 0424   BUN 51 (H) 02/19/2020 0759   CREATININE 5.17 (H) 09/04/2021 0424   CALCIUM 7.7 (L) 09/04/2021 0424   GFRNONAA 12 (L) 09/04/2021 0424   GFRAA 32 (L) 05/30/2020 0740   Estimated Creatinine Clearance: 14.5 mL/min (A) (by C-G formula based on SCr of 5.17 mg/dL (H)).  COAG Lab Results  Component Value Date   INR 1.2 12/04/2020   INR 1.2 12/03/2020   INR 1.4 (H) 06/25/2019   Radiology DG Tibia/Fibula Left  Result Date: 09/03/2021 CLINICAL DATA:  Weeping of lower leg, bleeding wound at heel, blisters EXAM: LEFT TIBIA AND FIBULA - 2 VIEW COMPARISON:  None FINDINGS: Osseous demineralization. Knee and ankle joint alignments normal. No acute fracture, dislocation, or bone destruction. Diffuse soft tissue swelling greatest at ankle. Scattered atherosclerotic calcifications within runoff vessels. IMPRESSION: No acute osseous abnormalities. Electronically Signed   By: Crist Infante.D.  On: 09/03/2021 19:12   US Renal  Result Date: 09/03/2021 CLINICAL DATA:  Acute on chronic kidney disease  EXAM: RENAL / URINARY TRACT ULTRASOUND COMPLETE COMPARISON:  12/03/2020 FINDINGS: Right Kidney: Renal measurements: 11.6 x 7.0 x 6.3 cm = volume: 271 mL. Echogenic renal parenchyma. Trace perinephric fluid. No mass or hydronephrosis. Left Kidney: Renal measurements: 10.7 x 6.5 x 5.5 cm = volume: 199 mL. Echogenic renal parenchyma. Trace perinephric fluid. No mass or hydronephrosis. Bladder: Appears normal for degree of bladder distention. Other: Small right pleural effusion. IMPRESSION: Echogenic renal parenchyma, suggesting medical renal disease. No hydronephrosis. Electronically Signed   By: Julian Hy M.D.   On: 09/03/2021 20:49   US Venous Img Lower Bilateral (DVT)  Result Date: 09/04/2021 CLINICAL DATA:  Bilateral lower extremity edema.  Evaluate for DVT. EXAM: BILATERAL LOWER EXTREMITY VENOUS DOPPLER ULTRASOUND TECHNIQUE: Gray-scale sonography with graded compression, as well as color Doppler and duplex ultrasound were performed to evaluate the lower extremity deep venous systems from the level of the common femoral vein and including the common femoral, femoral, profunda femoral, popliteal and calf veins including the posterior tibial, peroneal and gastrocnemius veins when visible. The superficial great saphenous vein was also interrogated. Spectral Doppler was utilized to evaluate flow at rest and with distal augmentation maneuvers in the common femoral, femoral and popliteal veins. COMPARISON:  None. FINDINGS: RIGHT LOWER EXTREMITY Common Femoral Vein: No evidence of thrombus. Normal compressibility, respiratory phasicity and response to augmentation. Saphenofemoral Junction: No evidence of thrombus. Normal compressibility and flow on color Doppler imaging. Profunda Femoral Vein: No evidence of thrombus. Normal compressibility and flow on color Doppler imaging. Femoral Vein: No evidence of thrombus. Normal compressibility, respiratory phasicity and response to augmentation. Popliteal Vein: No  evidence of thrombus. Normal compressibility, respiratory phasicity and response to augmentation. Calf Veins: No evidence of thrombus. Normal compressibility and flow on color Doppler imaging. Superficial Great Saphenous Vein: No evidence of thrombus. Normal compressibility. Venous Reflux:  None. Other Findings: There is a moderate amount of subcutaneous edema at the level the right calf (image 10). LEFT LOWER EXTREMITY Common Femoral Vein: No evidence of thrombus. Normal compressibility, respiratory phasicity and response to augmentation. Saphenofemoral Junction: No evidence of thrombus. Normal compressibility and flow on color Doppler imaging. Profunda Femoral Vein: No evidence of thrombus. Normal compressibility and flow on color Doppler imaging. Femoral Vein: No evidence of thrombus. Normal compressibility, respiratory phasicity and response to augmentation. Popliteal Vein: No evidence of thrombus. Normal compressibility, respiratory phasicity and response to augmentation. Calf Veins: No evidence of thrombus. Normal compressibility and flow on color Doppler imaging. Superficial Great Saphenous Vein: No evidence of thrombus. Normal compressibility. Venous Reflux:  None. Other Findings:  None. IMPRESSION: No evidence of DVT within either lower extremity. Electronically Signed   By: Sandi Mariscal M.D.   On: 09/04/2021 07:04   US ARTERIAL ABI (SCREENING LOWER EXTREMITY)  Result Date: 09/04/2021 CLINICAL DATA:  60 year old male with bilateral lower extremity edema. EXAM: NONINVASIVE PHYSIOLOGIC VASCULAR STUDY OF BILATERAL LOWER EXTREMITIES TECHNIQUE: Evaluation of both lower extremities were performed at rest, including calculation of ankle-brachial indices with single level Doppler, pressure and pulse volume recording. COMPARISON:  None. FINDINGS: Right ABI:  Noncompressible. Left ABI:  Noncompressible Right Lower Extremity:  Monophasic arterial waveforms at the ankle. Left Lower Extremity:  Monophasic arterial  waveforms at the ankle. IMPRESSION: Noncompressible lower extremity arteries at the level of the ankle, unable to calculate ankle-brachial indices. Monophasic waveforms bilaterally suggest bilateral lower extremity peripheral artery disease.  Consider lower extremity duplex, CTA runoff, or direct angiography for further evaluation. Ruthann Cancer, MD [facility] Electronically Signed   By: Ruthann Cancer M.D.   On: 09/04/2021 10:19    Assessment/Plan [place] A Crumble is a 60 y.o. male with medical history significant for type 2 diabetes mellitus, hypertension, paroxysmal atrial fibrillation (he stopped taking Eliquis on his own due to bleeding), combined systolic and diastolic CHF and stage III chronic kidney disease, who presented to the ER with acute onset of bleeding from his left calf wound.    1.  Nonhealing wound to the left lower extremity: Patient with multiple risk factors for atherosclerotic disease.  Patient has a progressively worsening nonhealing wound to the back of the left calf.  He has stopped his Eliquis on his own due to "bleeding".  Recommend the patient undergo an angiogram and attempt to assess his anatomy and contributing degree of atherosclerotic disease.  If appropriate an attempt to revascularize leg can be made at that time procedure, risks and benefits were explained to the patient.  All questions were answered.  At this time the patient want to think about it.  Once arterial patency has been confirmed or improved we will plan on taking the patient to the operating room for wound debridement.  2.  Chronic kidney disease: Renal ultrasound pending We will try to minimize contrast exposure during his angiogram as the patient does have chronic kidney disease  3.  Hyperlipidemia: On aspirin for medical management Patient stopped his Eliquis on his own ?  Addition of statin Encouraged good control as its slows the  progression of atherosclerotic disease   4.  Diabetes: Appropriate medications Encouraged good control as its slows the progression of atherosclerotic disease   Discussed with Dr. Mayme Genta, PA-C 09/04/2021 3:55 PM  This note was created with Dragon medical transcription system.  Any error is purely unintentional.

## 2021-09-05 LAB — COMPREHENSIVE METABOLIC PANEL
ALT: 18 U/L (ref 0–44)
AST: 19 U/L (ref 15–41)
Albumin: 2 g/dL — ABNORMAL LOW (ref 3.5–5.0)
Alkaline Phosphatase: 197 U/L — ABNORMAL HIGH (ref 38–126)
Anion gap: 10 (ref 5–15)
BUN: 107 mg/dL — ABNORMAL HIGH (ref 6–20)
CO2: 19 mmol/L — ABNORMAL LOW (ref 22–32)
Calcium: 7.5 mg/dL — ABNORMAL LOW (ref 8.9–10.3)
Chloride: 112 mmol/L — ABNORMAL HIGH (ref 98–111)
Creatinine, Ser: 5.41 mg/dL — ABNORMAL HIGH (ref 0.61–1.24)
GFR, Estimated: 12 mL/min — ABNORMAL LOW (ref >60)
Glucose, Bld: 103 mg/dL — ABNORMAL HIGH (ref 70–99)
Potassium: 4.2 mmol/L (ref 3.5–5.1)
Sodium: 141 mmol/L (ref 135–145)
Total Bilirubin: 0.6 mg/dL (ref 0.3–1.2)
Total Protein: 4.7 g/dL — ABNORMAL LOW (ref 6.5–8.1)

## 2021-09-05 LAB — CBC WITH DIFFERENTIAL/PLATELET
Abs Immature Granulocytes: 0.05 10*3/uL (ref 0.00–0.07)
Basophils Absolute: 0.1 10*3/uL (ref 0.0–0.1)
Basophils Relative: 1 %
Eosinophils Absolute: 0.2 10*3/uL (ref 0.0–0.5)
Eosinophils Relative: 3 %
HCT: 22.4 % — ABNORMAL LOW (ref 39.0–52.0)
Hemoglobin: 7.3 g/dL — ABNORMAL LOW (ref 13.0–17.0)
Immature Granulocytes: 1 %
Lymphocytes Relative: 7 %
Lymphs Abs: 0.5 10*3/uL — ABNORMAL LOW (ref 0.7–4.0)
MCH: 30.2 pg (ref 26.0–34.0)
MCHC: 32.6 g/dL (ref 30.0–36.0)
MCV: 92.6 fL (ref 80.0–100.0)
Monocytes Absolute: 0.6 10*3/uL (ref 0.1–1.0)
Monocytes Relative: 8 %
Neutro Abs: 5.4 10*3/uL (ref 1.7–7.7)
Neutrophils Relative %: 80 %
Platelets: 333 10*3/uL (ref 150–400)
RBC: 2.42 MIL/uL — ABNORMAL LOW (ref 4.22–5.81)
RDW: 18.1 % — ABNORMAL HIGH (ref 11.5–15.5)
WBC: 6.8 10*3/uL (ref 4.0–10.5)
nRBC: 0 % (ref 0.0–0.2)

## 2021-09-05 LAB — TYPE AND SCREEN
ABO/RH(D): B POS
Antibody Screen: NEGATIVE
Unit division: 0

## 2021-09-05 LAB — HEMOGLOBIN AND HEMATOCRIT, BLOOD
HCT: 22.7 % — ABNORMAL LOW (ref 39.0–52.0)
Hemoglobin: 7.5 g/dL — ABNORMAL LOW (ref 13.0–17.0)

## 2021-09-05 LAB — BPAM RBC
Blood Product Expiration Date: 202301092359
ISSUE DATE / TIME: 202212161318
Unit Type and Rh: 7300

## 2021-09-05 LAB — GLUCOSE, CAPILLARY
Glucose-Capillary: 103 mg/dL — ABNORMAL HIGH (ref 70–99)
Glucose-Capillary: 106 mg/dL — ABNORMAL HIGH (ref 70–99)
Glucose-Capillary: 115 mg/dL — ABNORMAL HIGH (ref 70–99)
Glucose-Capillary: 141 mg/dL — ABNORMAL HIGH (ref 70–99)
Glucose-Capillary: 162 mg/dL — ABNORMAL HIGH (ref 70–99)
Glucose-Capillary: 89 mg/dL (ref 70–99)

## 2021-09-05 LAB — VITAMIN B12: Vitamin B-12: 637 pg/mL (ref 180–914)

## 2021-09-05 LAB — HEMOGLOBIN A1C
Hgb A1c MFr Bld: 6.7 % — ABNORMAL HIGH (ref 4.8–5.6)
Mean Plasma Glucose: 146 mg/dL

## 2021-09-05 LAB — IRON AND TIBC
Iron: 73 ug/dL (ref 45–182)
Saturation Ratios: 32 % (ref 17.9–39.5)
TIBC: 228 ug/dL — ABNORMAL LOW (ref 250–450)
UIBC: 155 ug/dL

## 2021-09-05 LAB — FERRITIN: Ferritin: 85 ng/mL (ref 24–336)

## 2021-09-05 LAB — FOLATE: Folate: 28 ng/mL (ref >5.9)

## 2021-09-05 LAB — VANCOMYCIN, RANDOM: Vancomycin Rm: 18

## 2021-09-05 LAB — CREATININE, SERUM
Creatinine, Ser: 5.45 mg/dL — ABNORMAL HIGH (ref 0.61–1.24)
GFR, Estimated: 11 mL/min — ABNORMAL LOW (ref >60)

## 2021-09-05 MED ORDER — SODIUM CHLORIDE 0.9 % IV SOLN: INTRAVENOUS | Status: DC | PRN

## 2021-09-05 NOTE — Consult Note (Signed)
Pharmacy Antibiotic Note  Nathan [name] is a 60 y.o. male with medical history including HTN, HLD, Afib, CKD stage IV, CHF, PVD admitted on 09/03/2021 with  LLE cellulitis . Patient has a history of MRSA infections. Pharmacy has been consulted for cefepime and vancomycin dosing.  Plan: On ceftriaxone 1 g daily x 7 days total.   Pt received vancomycin 1500 mg x 1 on 12/15 @2344 . Random level on 12/17 @0323  was obtained (18 ug/mL). Will hold off re-dosing vancomycin today. Will order another vancomycin random for tomorrow. F/u with RRT and adjust vancomycin if necessary.   Height: 5\' 9"  (175.3 cm) Weight: 65.8 kg (145 lb) IBW/kg (Calculated) : 70.7  Temp (24hrs), Avg:98.1 F (36.7 C), Min:97.6 F (36.4 C), Max:99 F (37.2 C)  Recent Labs  Lab 09/03/21 1858 09/04/21 0424 09/05/21 0323  WBC 8.4 8.5  --   CREATININE 5.45* 5.17* 5.45*  VANCORANDOM  --   --  18     Estimated Creatinine Clearance: 13.8 mL/min (A) (by C-G formula based on SCr of 5.45 mg/dL (H)).    No Known Allergies  Antimicrobials this admission: Vancomycin 12/15 >>  Cefepime 12/15 >> 12/16  Dose adjustments this admission: N/A  Microbiology results: 12/15 Wound Cx: Pending  Thank you for allowing pharmacy to be a part of this patients care.  Oswald Hillock 09/05/2021 7:46 AM

## 2021-09-05 NOTE — Progress Notes (Signed)
[facility]  ROUNDING NOTE   Subjective:   Nathan [name] is a 60 year old male with past medical conditions including diabetes, paroxysmal A. fib, diabetes, systolic and diastolic chronic heart failure, hypertension, and chronic kidney disease stage IV.  Patient reports to the emergency department with complaints of bleeding from his left calf wound.  Patient has been admitted for Hyperglycemia [R73.9] Cellulitis of left lower extremity [L03.116] AKI (acute kidney injury) ([place]) [N17.9] Left leg cellulitis [D42.876]  Patient is known to our office and is followed Dr. Lanora Manis.  He was last seen in the office March 25, 2021 for routine follow-up.    Update: Patient seen resting quietly Alert and oriented States he doesn't feel as well as yesterday Denies nausea and vomiting, states food taste as it should Complains of mild shortness of breath  Creatinine increased to 5.45 (5.1) Recorded urine output of 700 mL in preceding 24 hours.   Objective:  Vital signs in last 24 hours:  Temp:  [97.6 F (36.4 C)-99 F (37.2 C)] 97.6 F (36.4 C) (12/17 0721) Pulse Rate:  [63-75] 63 (12/17 0721) Resp:  [17-18] 17 (12/17 0721) BP: (110-158)/(60-82) 158/82 (12/17 0721) SpO2:  [95 %-100 %] 100 % (12/17 0721)  Weight change:  Filed Weights   09/03/21 1824  Weight: 65.8 kg    Intake/Output: I/O last 3 completed shifts: In: 1395.9 [I.V.:633.7; Blood:364; IV Piggyback:398.2] Out: 1100 [Urine:1100]   Intake/Output this shift:  No intake/output data recorded.  Physical Exam: General: NAD, resting in bed  Head: Normocephalic, atraumatic. Moist oral mucosal membranes  Eyes: Anicteric  Lungs:  Fine basilar crackles, normal effort, room air  Heart: Regular rate and rhythm  Abdomen:  Soft, nontender, nondistended  Extremities: 1+ peripheral edema.  Neurologic: Nonfocal, moving all four extremities  Skin: LLE wound       Basic Metabolic Panel: Recent Labs  Lab  09/03/21 1858 09/04/21 0424 09/05/21 0323  NA 141 142  --   K 3.9 3.9  --   CL 111 114*  --   CO2 21* 21*  --   GLUCOSE 229* 104*  --   BUN 101* 106*  --   CREATININE 5.45* 5.17* 5.45*  CALCIUM 8.0* 7.7*  --      Liver Function Tests: No results for input(s): AST, ALT, ALKPHOS, BILITOT, PROT, ALBUMIN in the last 168 hours. No results for input(s): LIPASE, AMYLASE in the last 168 hours. No results for input(s): AMMONIA in the last 168 hours.  CBC: Recent Labs  Lab 09/03/21 1858 09/04/21 0424 09/05/21 0813  WBC 8.4 8.5 6.8  NEUTROABS 6.6  --  5.4  HGB 7.4* 6.9* 7.3*  HCT 22.7* 21.5* 22.4*  MCV 99.6 97.3 92.6  PLT 535* 448* 333     Cardiac Enzymes: No results for input(s): CKTOTAL, CKMB, CKMBINDEX, TROPONINI in the last 168 hours.  BNP: Invalid input(s): POCBNP  CBG: Recent Labs  Lab 09/04/21 1605 09/04/21 1940 09/05/21 0024 09/05/21 0345 09/05/21 0723  GLUCAP 108* 153* 115* 89 106*     Microbiology: Results for orders placed or performed during the hospital encounter of 09/03/21  Resp Panel by RT-PCR (Flu A&B, Covid) Nasopharyngeal Swab     Status: None   Collection Time: 09/03/21  8:30 PM   Specimen: Nasopharyngeal Swab; Nasopharyngeal(NP) swabs in vial transport medium  Result Value Ref Range Status   SARS Coronavirus 2 by RT PCR NEGATIVE NEGATIVE Final    Comment: (NOTE) SARS-CoV-2 target nucleic acids are NOT DETECTED.  The SARS-CoV-2 RNA is generally detectable in upper respiratory specimens during the acute phase of infection. The lowest concentration of SARS-CoV-2 viral copies this assay can detect is 138 copies/mL. A negative result does not preclude SARS-Cov-2 infection and should not be used as the sole basis for treatment or other patient management decisions. A negative result may occur with  improper specimen collection/handling, submission of specimen other than nasopharyngeal swab, presence of viral mutation(s) within the areas  targeted by this assay, and inadequate number of viral copies(<138 copies/mL). A negative result must be combined with clinical observations, patient history, and epidemiological information. The expected result is Negative.  Fact Sheet for Patients:  EntrepreneurPulse.com.au  Fact Sheet for Healthcare Providers:  IncredibleEmployment.be  This test is no t yet approved or cleared by the [place] FDA and  has been authorized for detection and/or diagnosis of SARS-CoV-2 by FDA under an Emergency Use Authorization (EUA). This EUA will remain  in effect (meaning this test can be used) for the duration of the COVID-19 declaration under Section 564(b)(1) of the Act, 21 U.S.C.section 360bbb-3(b)(1), unless the authorization is terminated  or revoked sooner.       Influenza A by PCR NEGATIVE NEGATIVE Final   Influenza B by PCR NEGATIVE NEGATIVE Final    Comment: (NOTE) The Xpert Xpress SARS-CoV-2/FLU/RSV plus assay is intended as an aid in the diagnosis of influenza from Nasopharyngeal swab specimens and should not be used as a sole basis for treatment. Nasal washings and aspirates are unacceptable for Xpert Xpress SARS-CoV-2/FLU/RSV testing.  Fact Sheet for Patients: EntrepreneurPulse.com.au  Fact Sheet for Healthcare Providers: IncredibleEmployment.be  This test is not yet approved or cleared by the [place] FDA and has been authorized for detection and/or diagnosis of SARS-CoV-2 by FDA under an Emergency Use Authorization (EUA). This EUA will remain in effect (meaning this test can be used) for the duration of the COVID-19 declaration under Section 564(b)(1) of the Act, 21 U.S.C. section 360bbb-3(b)(1), unless the authorization is terminated or revoked.  Performed at [facility], [address] [place], [place] [address]   MRSA Next Gen by PCR, Nasal     Status: None   Collection  Time: 09/04/21 12:39 PM   Specimen: Nasal Mucosa; Nasal Swab  Result Value Ref Range Status   MRSA by PCR Next Gen NOT DETECTED NOT DETECTED Final    Comment: (NOTE) The GeneXpert MRSA Assay (FDA approved for NASAL specimens only), is one component of a comprehensive MRSA colonization surveillance program. It is not intended to diagnose MRSA infection nor to guide or monitor treatment for MRSA infections. Test performance is not FDA approved in patients less than 20 years old. Performed at [facility], [address] [place], [place] [address]     Coagulation Studies: No results for input(s): LABPROT, INR in the last 72 hours.  Urinalysis: Recent Labs    09/03/21 0723  COLORURINE YELLOW  LABSPEC 1.020  PHURINE 5.0  GLUCOSEU 250*  HGBUR TRACE*  BILIRUBINUR NEGATIVE  KETONESUR NEGATIVE  PROTEINUR >300*  NITRITE NEGATIVE  LEUKOCYTESUR NEGATIVE       Imaging: DG Tibia/Fibula Left  Result Date: 09/03/2021 CLINICAL DATA:  Weeping of lower leg, bleeding wound at heel, blisters EXAM: LEFT TIBIA AND FIBULA - 2 VIEW COMPARISON:  None FINDINGS: Osseous demineralization. Knee and ankle joint alignments normal. No acute fracture, dislocation, or bone destruction. Diffuse soft tissue swelling greatest at ankle. Scattered atherosclerotic calcifications within runoff vessels. IMPRESSION: No acute osseous abnormalities.  Electronically Signed   By: Lavonia Dana M.D.   On: 09/03/2021 19:12   US Renal  Result Date: 09/03/2021 CLINICAL DATA:  Acute on chronic kidney disease EXAM: RENAL / URINARY TRACT ULTRASOUND COMPLETE COMPARISON:  12/03/2020 FINDINGS: Right Kidney: Renal measurements: 11.6 x 7.0 x 6.3 cm = volume: 271 mL. Echogenic renal parenchyma. Trace perinephric fluid. No mass or hydronephrosis. Left Kidney: Renal measurements: 10.7 x 6.5 x 5.5 cm = volume: 199 mL. Echogenic renal parenchyma. Trace perinephric fluid. No mass or hydronephrosis. Bladder: Appears normal for  degree of bladder distention. Other: Small right pleural effusion. IMPRESSION: Echogenic renal parenchyma, suggesting medical renal disease. No hydronephrosis. Electronically Signed   By: Julian Hy M.D.   On: 09/03/2021 20:49   US Venous Img Lower Bilateral (DVT)  Result Date: 09/04/2021 CLINICAL DATA:  Bilateral lower extremity edema.  Evaluate for DVT. EXAM: BILATERAL LOWER EXTREMITY VENOUS DOPPLER ULTRASOUND TECHNIQUE: Gray-scale sonography with graded compression, as well as color Doppler and duplex ultrasound were performed to evaluate the lower extremity deep venous systems from the level of the common femoral vein and including the common femoral, femoral, profunda femoral, popliteal and calf veins including the posterior tibial, peroneal and gastrocnemius veins when visible. The superficial great saphenous vein was also interrogated. Spectral Doppler was utilized to evaluate flow at rest and with distal augmentation maneuvers in the common femoral, femoral and popliteal veins. COMPARISON:  None. FINDINGS: RIGHT LOWER EXTREMITY Common Femoral Vein: No evidence of thrombus. Normal compressibility, respiratory phasicity and response to augmentation. Saphenofemoral Junction: No evidence of thrombus. Normal compressibility and flow on color Doppler imaging. Profunda Femoral Vein: No evidence of thrombus. Normal compressibility and flow on color Doppler imaging. Femoral Vein: No evidence of thrombus. Normal compressibility, respiratory phasicity and response to augmentation. Popliteal Vein: No evidence of thrombus. Normal compressibility, respiratory phasicity and response to augmentation. Calf Veins: No evidence of thrombus. Normal compressibility and flow on color Doppler imaging. Superficial Great Saphenous Vein: No evidence of thrombus. Normal compressibility. Venous Reflux:  None. Other Findings: There is a moderate amount of subcutaneous edema at the level the right calf (image 10). LEFT LOWER  EXTREMITY Common Femoral Vein: No evidence of thrombus. Normal compressibility, respiratory phasicity and response to augmentation. Saphenofemoral Junction: No evidence of thrombus. Normal compressibility and flow on color Doppler imaging. Profunda Femoral Vein: No evidence of thrombus. Normal compressibility and flow on color Doppler imaging. Femoral Vein: No evidence of thrombus. Normal compressibility, respiratory phasicity and response to augmentation. Popliteal Vein: No evidence of thrombus. Normal compressibility, respiratory phasicity and response to augmentation. Calf Veins: No evidence of thrombus. Normal compressibility and flow on color Doppler imaging. Superficial Great Saphenous Vein: No evidence of thrombus. Normal compressibility. Venous Reflux:  None. Other Findings:  None. IMPRESSION: No evidence of DVT within either lower extremity. Electronically Signed   By: Sandi Mariscal M.D.   On: 09/04/2021 07:04   US ARTERIAL ABI (SCREENING LOWER EXTREMITY)  Result Date: 09/04/2021 CLINICAL DATA:  60 year old male with bilateral lower extremity edema. EXAM: NONINVASIVE PHYSIOLOGIC VASCULAR STUDY OF BILATERAL LOWER EXTREMITIES TECHNIQUE: Evaluation of both lower extremities were performed at rest, including calculation of ankle-brachial indices with single level Doppler, pressure and pulse volume recording. COMPARISON:  None. FINDINGS: Right ABI:  Noncompressible. Left ABI:  Noncompressible Right Lower Extremity:  Monophasic arterial waveforms at the ankle. Left Lower Extremity:  Monophasic arterial waveforms at the ankle. IMPRESSION: Noncompressible lower extremity arteries at the level of the ankle, unable to calculate ankle-brachial indices.  Monophasic waveforms bilaterally suggest bilateral lower extremity peripheral artery disease. Consider lower extremity duplex, CTA runoff, or direct angiography for further evaluation. Ruthann Cancer, MD [facility]  Radiology Electronically Signed   By: Ruthann Cancer M.D.   On: 09/04/2021 10:19     Medications:    sodium chloride 10 mL/hr at 09/05/21 0351   cefTRIAXone (ROCEPHIN)  IV 1 g (09/05/21 0352)    atorvastatin  40 mg Oral QHS   carvedilol  25 mg Oral BID WC   ferrous sulfate  325 mg Oral Q breakfast   heparin injection (subcutaneous)  5,000 Units Subcutaneous Q8H   hydrALAZINE  50 mg Oral TID   insulin aspart  0-15 Units Subcutaneous Q4H   isosorbide mononitrate  30 mg Oral BID   multivitamin with minerals  1 tablet Oral Daily   vancomycin variable dose per unstable renal function (pharmacist dosing)   Does not apply See admin instructions   sodium chloride, acetaminophen **OR** acetaminophen, morphine injection, ondansetron **OR** ondansetron (ZOFRAN) IV, traZODone  Assessment/ Plan:  [name] is a 60 y.o.  male with past medical conditions including diabetes, paroxysmal A. fib, diabetes, systolic and diastolic chronic heart failure, hypertension, and chronic kidney disease stage IV.  Patient reports to the emergency department with complaints of bleeding from his left calf wound.  Patient has been admitted for Hyperglycemia [R73.9] Cellulitis of left lower extremity [L03.116] AKI (acute kidney injury) ([place]) [N17.9] Left leg cellulitis [O53.664]   Acute Kidney Injury on chronic kidney disease stage IV with baseline creatinine 3.98 and GFR of 16 on 03/25/21.  Acute kidney injury secondary likely to aggressive diuresis Continue to hold diuretics Renal ultrasound negative for obstruction, No IV contrast exposure Patient has been on aggressive diuresis outpatient, furosemide 80 mg twice daily and metolazone 2.5 mg twice daily.    -Creatinine continues to increase.  Patient currently denies uremic symptoms.  -Discussed with patient renal replacement therapy and different modalities.  Patient currently interested in peritoneal dialysis.    -He will discuss this with his wife  and we will follow-up tomorrow.   Lab Results  Component Value Date   CREATININE 5.45 (H) 09/05/2021   CREATININE 5.17 (H) 09/04/2021   CREATININE 5.45 (H) 09/03/2021    Intake/Output Summary (Last 24 hours) at 09/05/2021 0914 Last data filed at 09/04/2021 2335 Gross per 24 hour  Intake 364 ml  Output 700 ml  Net -336 ml    2. Anemia of chronic kidney disease Lab Results  Component Value Date   HGB 7.3 (L) 09/05/2021    Hgb remains low.  We will consider EPO with dialysis initiation.  3. Secondary Hyperparathyroidism: with outpatient labs: phosphorus 5.5, calcium 7.8 on 03/25/21.   Lab Results  Component Value Date   CALCIUM 7.7 (L) 09/04/2021   CAION 1.05 (L) 06/26/2019   PHOS 3.6 06/30/2019    Corrected calcium of 9.3.  Will obtain updated phosphorus with morning labs.  4.  Chronic systolic heart failure.  Echo from 03/14/2021 shows EF 40 to 45% with a grade 2 diastolic dysfunction and mild LVH.  Placed on furosemide and metolazone (outpatient) as above for fluid management.    -All diuretics currently held  5. LLE cellulitis, Wound culture obtained Vascular consulted for angiogram Receiving Ceftriaxone and Vanc   LOS: 2 Teneisha Gignac 12/17/20229:14 AM

## 2021-09-05 NOTE — Progress Notes (Signed)
PROGRESS NOTE    Nathan [name]  UVO:[id number] DOB: 04/07/1961 DOA: 09/03/2021 PCP: Derinda Late, MD  Chief Complaint  Patient presents with   Foot Pain    Brief Narrative:  60 y.o. male with medical history significant for type 2 diabetes mellitus, hypertension, paroxysmal atrial fibrillation(he stopped taking Eliquis on his own due to bleeding), combined systolic and diastolic CHF and stage III chronic kidney disease, who presented to the ER with acute onset of bleeding from his left calf wound.      Assessment & Plan:   Principal Problem:   Left leg cellulitis Active Problems:   Non-healing wound of left lower extremity   Acute blood loss anemia   Acute kidney injury superimposed on CKD (HCC)   Acute on chronic HFrEF (heart failure with reduced ejection fraction) (HCC)   Atrial fibrillation, chronic (HCC)   Diabetes mellitus with diabetic neuropathy (HCC)   Essential hypertension   Hyperlipidemia   * Left leg cellulitis Continue abx, will narrow to vanc/ceftriaxone for now Appears improved today No crepitus, fluctuance, continue to monitor, low threshold for additional imaging ABI with noncompressible lower extremity arteries - monophasic waveforms bilaterally suggest bilateral LE PAD Palpable distal DP pulses, appreciate vascular assistance given his wound  Non-healing wound of left lower extremity Started with ruptured blister about 1 month ago Notably with peripheral neuropathy from below knees bilaterally  Appreciate wound care assistance and vascular assistance Vascular considering angiogram  Acute blood loss anemia Chronic, but likely worsened recently with bleeding from wound He's stopped his eliquis - on aspirin, indication not totally clear to me, will hold Follow anemia labs S/p 1 unit pRBC - inappropriate bump today, trend  Acute kidney injury superimposed on CKD (HCC) AKI on CKD IV Baseline creatinine appears to be around 3.8 Presented with  creatinine of 5.45 Creatinine 5.41 today Renal US with medical renal disease UA with >300 mg protein, 0-5 RBC's Appreciate renal assistance, they're considering dialysis   Acute on chronic HFrEF (heart failure with reduced ejection fraction) (HCC) Doesn't appear grossly overloaded at this time On coreg, imdur and hydralazine Lasix and metolazone on hold  Atrial fibrillation, chronic (HCC) eliquis on hold with bleeding, anemia (he notes he hasn't taken eliquis for maybe 6 months)  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  Antimicrobials:  Anti-infectives (From admission, onward)    Start     Dose/Rate Route Frequency Ordered Stop   09/05/21 0300  cefTRIAXone (ROCEPHIN) 1 g in sodium chloride 0.9 % 100 mL IVPB        1 g 200 mL/hr over 30 Minutes Intravenous Every 24 hours 09/04/21 1906 09/11/21 0259   09/03/21 2200  ceFEPIme (MAXIPIME) 2 g in sodium chloride 0.9 % 100 mL IVPB  Status:  Discontinued        2 g 200 mL/hr over 30 Minutes Intravenous Every 24 hours 09/03/21 2028 09/04/21 1906   09/03/21 2032  vancomycin variable dose per unstable renal function (pharmacist dosing)         Does not apply See admin instructions 09/03/21  2032     09/03/21 2030  vancomycin (VANCOCIN) IVPB 1000 mg/200 mL premix  Status:  Discontinued        1,000 mg 200 mL/hr over 60 Minutes Intravenous  Once 09/03/21 2028 09/03/21 2032   09/03/21 2000  vancomycin (VANCOREADY) IVPB 1500 mg/300 mL        1,500 mg 150 mL/hr over  120 Minutes Intravenous  Once 09/03/21 1921 09/04/21 0200       Subjective: No new complaints Disappointed by prospect of dialysis  Objective: Vitals:   09/05/21 0344 09/05/21 0721 09/05/21 1140 09/05/21 1530  BP: 127/71 (!) 158/82 125/66 132/72  Pulse: 65 63 (!) 59 64  Resp: 18 17 17 17   Temp: 97.8 F (36.6 C) 97.6 F (36.4 C) (!) 97.5 F (36.4 C) 97.7 F (36.5 C)  TempSrc:      SpO2: 100% 100% 98% 99%  Weight:      Height:        Intake/Output Summary (Last 24 hours) at 09/05/2021 1546 Last data filed at 09/05/2021 1422 Gross per 24 hour  Intake 724 ml  Output 600 ml  Net 124 ml   Filed Weights   09/03/21 1824  Weight: 65.8 kg    Examination:  General: No acute distress. Cardiovascular: Heart sounds show Prim Morace regular rate, and rhythm. No gallops or rubs. No murmurs. No JVD. Lungs: Clear to auscultation bilaterally with good air movement. No rales, rhonchi or wheezes. Abdomen: Soft, nontender, nondistended  Neurological: Alert and oriented 3. Moves all extremities 4 . Cranial nerves II through XII grossly intact. Skin: Warm and dry. No rashes or lesions. Extremities:Redness to LLE improved, stable ulceration with drainage - no crepitus/fluctuance appreciated     Data Reviewed: I have personally reviewed following labs and imaging studies  CBC: Recent Labs  Lab 09/03/21 1858 09/04/21 0424 09/05/21 0813  WBC 8.4 8.5 6.8  NEUTROABS 6.6  --  5.4  HGB 7.4* 6.9* 7.3*  HCT 22.7* 21.5* 22.4*  MCV 99.6 97.3 92.6  PLT 535* 448* 144    Basic Metabolic Panel: Recent Labs  Lab 09/03/21 1858 09/04/21 0424 09/05/21 0323 09/05/21 0813  NA 141 142  --  141  K 3.9 3.9  --  4.2  CL 111 114*  --  112*  CO2 21* 21*  --  19*  GLUCOSE 229* 104*  --  103*  BUN 101* 106*  --  107*  CREATININE 5.45* 5.17* 5.45* 5.41*  CALCIUM 8.0* 7.7*  --  7.5*    GFR: Estimated Creatinine Clearance: 13.9 mL/min (Dejae Bernet) (by C-G formula based on SCr of 5.41 mg/dL (H)).  Liver  Function Tests: Recent Labs  Lab 09/05/21 0813  AST 19  ALT 18  ALKPHOS 197*  BILITOT 0.6  PROT 4.7*  ALBUMIN 2.0*    CBG: Recent Labs  Lab 09/05/21 0024 09/05/21 0345 09/05/21 0723 09/05/21 1152 09/05/21 1531  GLUCAP 115* 89 106* 141* 162*     Recent Results (from the past 240 hour(s))  Resp Panel by RT-PCR (Flu Darwin Guastella&B, Covid) Nasopharyngeal Swab     Status: None   Collection Time: 09/03/21  8:30 PM   Specimen: Nasopharyngeal Swab; Nasopharyngeal(NP) swabs in vial transport medium  Result Value Ref Range Status   SARS Coronavirus 2 by RT PCR NEGATIVE NEGATIVE Final    Comment: (NOTE) SARS-CoV-2 target nucleic acids are NOT DETECTED.  The SARS-CoV-2 RNA is generally detectable in upper respiratory specimens during the acute phase of infection. The lowest concentration of  SARS-CoV-2 viral copies this assay can detect is 138 copies/mL. Mansel Strother negative result does not preclude SARS-Cov-2 infection and should not be used as the sole basis for treatment or other patient management decisions. Jinelle Butchko negative result may occur with  improper specimen collection/handling, submission of specimen other than nasopharyngeal swab, presence of viral mutation(s) within the areas targeted by this assay, and inadequate number of viral copies(<138 copies/mL). Keren Alverio negative result must be combined with clinical observations, patient history, and epidemiological information. The expected result is Negative.  Fact Sheet for Patients:  EntrepreneurPulse.com.au  Fact Sheet for Healthcare Providers:  IncredibleEmployment.be  This test is no t yet approved or cleared by the [place] FDA and  has been authorized for detection and/or diagnosis of SARS-CoV-2 by FDA under an Emergency Use Authorization (EUA). This EUA will remain  in effect (meaning this test can be used) for the duration of the COVID-19 declaration under Section 564(b)(1) of the Act,  21 U.S.C.section 360bbb-3(b)(1), unless the authorization is terminated  or revoked sooner.       Influenza Sani Loiseau by PCR NEGATIVE NEGATIVE Final   Influenza B by PCR NEGATIVE NEGATIVE Final    Comment: (NOTE) The Xpert Xpress SARS-CoV-2/FLU/RSV plus assay is intended as an aid in the diagnosis of influenza from Nasopharyngeal swab specimens and should not be used as Bettie Capistran sole basis for treatment. Nasal washings and aspirates are unacceptable for Xpert Xpress SARS-CoV-2/FLU/RSV testing.  Fact Sheet for Patients: EntrepreneurPulse.com.au  Fact Sheet for Healthcare Providers: IncredibleEmployment.be  This test is not yet approved or cleared by the [place] FDA and has been authorized for detection and/or diagnosis of SARS-CoV-2 by FDA under an Emergency Use Authorization (EUA). This EUA will remain in effect (meaning this test can be used) for the duration of the COVID-19 declaration under Section 564(b)(1) of the Act, 21 U.S.C. section 360bbb-3(b)(1), unless the authorization is terminated or revoked.  Performed at [facility], [address] [place], [place] [address]   MRSA Next Gen by PCR, Nasal     Status: None   Collection Time: 09/04/21 12:39 PM   Specimen: Nasal Mucosa; Nasal Swab  Result Value Ref Range Status   MRSA by PCR Next Gen NOT DETECTED NOT DETECTED Final    Comment: (NOTE) The GeneXpert MRSA Assay (FDA approved for NASAL specimens only), is one component of Saraiya Kozma comprehensive MRSA colonization surveillance program. It is not intended to diagnose MRSA infection nor to guide or monitor treatment for MRSA infections. Test performance is not FDA approved in patients less than 58 years old. Performed at [facility], [address] [place], [place] [address]          Radiology Studies: DG Tibia/Fibula Left  Result Date: 09/03/2021 CLINICAL DATA:  Weeping of lower leg, bleeding wound at heel,  blisters EXAM: LEFT TIBIA AND FIBULA - 2 VIEW COMPARISON:  None FINDINGS: Osseous demineralization. Knee and ankle joint alignments normal. No acute fracture, dislocation, or bone destruction. Diffuse soft tissue swelling greatest at ankle. Scattered atherosclerotic calcifications within runoff vessels. IMPRESSION: No acute osseous abnormalities. Electronically Signed   By: Lavonia Dana M.D.   On: 09/03/2021 19:12   US Renal  Result Date: 09/03/2021 CLINICAL DATA:  Acute on chronic kidney disease EXAM: RENAL / URINARY TRACT ULTRASOUND COMPLETE COMPARISON:  12/03/2020 FINDINGS: Right Kidney: Renal measurements: 11.6 x 7.0 x 6.3 cm = volume: 271 mL. Echogenic renal parenchyma. Trace perinephric fluid. No mass or hydronephrosis. Left Kidney: Renal measurements: 10.7 x 6.5  x 5.5 cm = volume: 199 mL. Echogenic renal parenchyma. Trace perinephric fluid. No mass or hydronephrosis. Bladder: Appears normal for degree of bladder distention. Other: Small right pleural effusion. IMPRESSION: Echogenic renal parenchyma, suggesting medical renal disease. No hydronephrosis. Electronically Signed   By: Julian Hy M.D.   On: 09/03/2021 20:49   US Venous Img Lower Bilateral (DVT)  Result Date: 09/04/2021 CLINICAL DATA:  Bilateral lower extremity edema.  Evaluate for DVT. EXAM: BILATERAL LOWER EXTREMITY VENOUS DOPPLER ULTRASOUND TECHNIQUE: Gray-scale sonography with graded compression, as well as color Doppler and duplex ultrasound were performed to evaluate the lower extremity deep venous systems from the level of the common femoral vein and including the common femoral, femoral, profunda femoral, popliteal and calf veins including the posterior tibial, peroneal and gastrocnemius veins when visible. The superficial great saphenous vein was also interrogated. Spectral Doppler was utilized to evaluate flow at rest and with distal augmentation maneuvers in the common femoral, femoral and popliteal veins. COMPARISON:   None. FINDINGS: RIGHT LOWER EXTREMITY Common Femoral Vein: No evidence of thrombus. Normal compressibility, respiratory phasicity and response to augmentation. Saphenofemoral Junction: No evidence of thrombus. Normal compressibility and flow on color Doppler imaging. Profunda Femoral Vein: No evidence of thrombus. Normal compressibility and flow on color Doppler imaging. Femoral Vein: No evidence of thrombus. Normal compressibility, respiratory phasicity and response to augmentation. Popliteal Vein: No evidence of thrombus. Normal compressibility, respiratory phasicity and response to augmentation. Calf Veins: No evidence of thrombus. Normal compressibility and flow on color Doppler imaging. Superficial Great Saphenous Vein: No evidence of thrombus. Normal compressibility. Venous Reflux:  None. Other Findings: There is Breeanna Galgano moderate amount of subcutaneous edema at the level the right calf (image 10). LEFT LOWER EXTREMITY Common Femoral Vein: No evidence of thrombus. Normal compressibility, respiratory phasicity and response to augmentation. Saphenofemoral Junction: No evidence of thrombus. Normal compressibility and flow on color Doppler imaging. Profunda Femoral Vein: No evidence of thrombus. Normal compressibility and flow on color Doppler imaging. Femoral Vein: No evidence of thrombus. Normal compressibility, respiratory phasicity and response to augmentation. Popliteal Vein: No evidence of thrombus. Normal compressibility, respiratory phasicity and response to augmentation. Calf Veins: No evidence of thrombus. Normal compressibility and flow on color Doppler imaging. Superficial Great Saphenous Vein: No evidence of thrombus. Normal compressibility. Venous Reflux:  None. Other Findings:  None. IMPRESSION: No evidence of DVT within either lower extremity. Electronically Signed   By: Sandi Mariscal M.D.   On: 09/04/2021 07:04   US ARTERIAL ABI (SCREENING LOWER EXTREMITY)  Result Date: 09/04/2021 CLINICAL DATA:   60 year old male with bilateral lower extremity edema. EXAM: NONINVASIVE PHYSIOLOGIC VASCULAR STUDY OF BILATERAL LOWER EXTREMITIES TECHNIQUE: Evaluation of both lower extremities were performed at rest, including calculation of ankle-brachial indices with single level Doppler, pressure and pulse volume recording. COMPARISON:  None. FINDINGS: Right ABI:  Noncompressible. Left ABI:  Noncompressible Right Lower Extremity:  Monophasic arterial waveforms at the ankle. Left Lower Extremity:  Monophasic arterial waveforms at the ankle. IMPRESSION: Noncompressible lower extremity arteries at the level of the ankle, unable to calculate ankle-brachial indices. Monophasic waveforms bilaterally suggest bilateral lower extremity peripheral artery disease. Consider lower extremity duplex, CTA runoff, or direct angiography for further evaluation. Ruthann Cancer, MD [facility] Radiology Electronically Signed   By: Ruthann Cancer M.D.   On: 09/04/2021 10:19        Scheduled Meds:  atorvastatin  40 mg Oral QHS   carvedilol  25 mg Oral BID WC  ferrous sulfate  325 mg Oral Q breakfast   heparin injection (subcutaneous)  5,000 Units Subcutaneous Q8H   hydrALAZINE  50 mg Oral TID   insulin aspart  0-15 Units Subcutaneous Q4H   isosorbide mononitrate  30 mg Oral BID   multivitamin with minerals  1 tablet Oral Daily   vancomycin variable dose per unstable renal function (pharmacist dosing)   Does not apply See admin instructions   Continuous Infusions:  sodium chloride 10 mL/hr at 09/05/21 0351   cefTRIAXone (ROCEPHIN)  IV 1 g (09/05/21 0352)     LOS: 2 days    Time spent: over 27 min    Fayrene Helper, MD [facility]   To contact the attending provider between 7A-7P or the covering provider during after hours 7P-7A, please log into the web site [email or web address] and access using universal [place] password for that web site. If you do not have the  password, please call the hospital operator.  09/05/2021, 3:46 PM

## 2021-09-06 LAB — CBC WITH DIFFERENTIAL/PLATELET
Abs Immature Granulocytes: 0.08 10*3/uL — ABNORMAL HIGH (ref 0.00–0.07)
Basophils Absolute: 0.1 10*3/uL (ref 0.0–0.1)
Basophils Relative: 1 %
Eosinophils Absolute: 0.3 10*3/uL (ref 0.0–0.5)
Eosinophils Relative: 4 %
HCT: 22.8 % — ABNORMAL LOW (ref 39.0–52.0)
Hemoglobin: 7.4 g/dL — ABNORMAL LOW (ref 13.0–17.0)
Immature Granulocytes: 1 %
Lymphocytes Relative: 6 %
Lymphs Abs: 0.4 10*3/uL — ABNORMAL LOW (ref 0.7–4.0)
MCH: 29.6 pg (ref 26.0–34.0)
MCHC: 32.5 g/dL (ref 30.0–36.0)
MCV: 91.2 fL (ref 80.0–100.0)
Monocytes Absolute: 0.7 10*3/uL (ref 0.1–1.0)
Monocytes Relative: 10 %
Neutro Abs: 5.2 10*3/uL (ref 1.7–7.7)
Neutrophils Relative %: 78 %
Platelets: 338 10*3/uL (ref 150–400)
RBC: 2.5 MIL/uL — ABNORMAL LOW (ref 4.22–5.81)
RDW: 17.6 % — ABNORMAL HIGH (ref 11.5–15.5)
WBC: 6.6 10*3/uL (ref 4.0–10.5)
nRBC: 0 % (ref 0.0–0.2)

## 2021-09-06 LAB — COMPREHENSIVE METABOLIC PANEL
ALT: 17 U/L (ref 0–44)
AST: 18 U/L (ref 15–41)
Albumin: 2.2 g/dL — ABNORMAL LOW (ref 3.5–5.0)
Alkaline Phosphatase: 191 U/L — ABNORMAL HIGH (ref 38–126)
Anion gap: 9 (ref 5–15)
BUN: 101 mg/dL — ABNORMAL HIGH (ref 6–20)
CO2: 18 mmol/L — ABNORMAL LOW (ref 22–32)
Calcium: 8.1 mg/dL — ABNORMAL LOW (ref 8.9–10.3)
Chloride: 111 mmol/L (ref 98–111)
Creatinine, Ser: 5.28 mg/dL — ABNORMAL HIGH (ref 0.61–1.24)
GFR, Estimated: 12 mL/min — ABNORMAL LOW (ref >60)
Glucose, Bld: 90 mg/dL (ref 70–99)
Potassium: 4.3 mmol/L (ref 3.5–5.1)
Sodium: 138 mmol/L (ref 135–145)
Total Bilirubin: 0.7 mg/dL (ref 0.3–1.2)
Total Protein: 5 g/dL — ABNORMAL LOW (ref 6.5–8.1)

## 2021-09-06 LAB — GLUCOSE, CAPILLARY
Glucose-Capillary: 85 mg/dL (ref 70–99)
Glucose-Capillary: 79 mg/dL (ref 70–99)
Glucose-Capillary: 104 mg/dL — ABNORMAL HIGH (ref 70–99)
Glucose-Capillary: 82 mg/dL (ref 70–99)
Glucose-Capillary: 123 mg/dL — ABNORMAL HIGH (ref 70–99)
Glucose-Capillary: 171 mg/dL — ABNORMAL HIGH (ref 70–99)

## 2021-09-06 LAB — HEPATITIS B SURFACE ANTIGEN: Hepatitis B Surface Ag: NONREACTIVE

## 2021-09-06 LAB — MAGNESIUM: Magnesium: 2.6 mg/dL — ABNORMAL HIGH (ref 1.7–2.4)

## 2021-09-06 LAB — PHOSPHORUS: Phosphorus: 6.2 mg/dL — ABNORMAL HIGH (ref 2.5–4.6)

## 2021-09-06 LAB — VANCOMYCIN, RANDOM: Vancomycin Rm: 15

## 2021-09-06 LAB — HEPATITIS B CORE ANTIBODY, TOTAL: Hep B Core Total Ab: NONREACTIVE

## 2021-09-06 NOTE — Progress Notes (Signed)
Subjective: Interval History: none..   Objective: Vital signs in last 24 hours: Temp:  [97.5 F (36.4 C)-98.2 F (36.8 C)] 98.2 F (36.8 C) (12/18 0330) Pulse Rate:  [59-65] 65 (12/18 0330) Resp:  [17] 17 (12/18 0330) BP: (125-148)/(66-82) 148/82 (12/18 0330) SpO2:  [97 %-100 %] 100 % (12/18 0330)  Intake/Output from previous day: 12/17 0701 - 12/18 0700 In: 725.4 [P.O.:600; I.V.:25.4; IV Piggyback:100] Out: 400 [Urine:400] Intake/Output this shift: No intake/output data recorded.  General appearance: alert and no distress Incision/Wound: Dressing in place left lower extremity  Lab Results: Recent Labs    09/05/21 0813 09/05/21 1603 09/06/21 0335  WBC 6.8  --  6.6  HGB 7.3* 7.5* 7.4*  HCT 22.4* 22.7* 22.8*  PLT 333  --  338   BMET Recent Labs    09/05/21 0813 09/06/21 0335  NA 141 138  K 4.2 4.3  CL 112* 111  CO2 19* 18*  GLUCOSE 103* 90  BUN 107* 101*  CREATININE 5.41* 5.28*  CALCIUM 7.5* 8.1*    Studies/Results: DG Tibia/Fibula Left  Result Date: 09/03/2021 CLINICAL DATA:  Weeping of lower leg, bleeding wound at heel, blisters EXAM: LEFT TIBIA AND FIBULA - 2 VIEW COMPARISON:  None FINDINGS: Osseous demineralization. Knee and ankle joint alignments normal. No acute fracture, dislocation, or bone destruction. Diffuse soft tissue swelling greatest at ankle. Scattered atherosclerotic calcifications within runoff vessels. IMPRESSION: No acute osseous abnormalities. Electronically Signed   By: Lavonia Dana M.D.   On: 09/03/2021 19:12   US Renal  Result Date: 09/03/2021 CLINICAL DATA:  Acute on chronic kidney disease EXAM: RENAL / URINARY TRACT ULTRASOUND COMPLETE COMPARISON:  12/03/2020 FINDINGS: Right Kidney: Renal measurements: 11.6 x 7.0 x 6.3 cm = volume: 271 mL. Echogenic renal parenchyma. Trace perinephric fluid. No mass or hydronephrosis. Left Kidney: Renal measurements: 10.7 x 6.5 x 5.5 cm = volume: 199 mL. Echogenic renal parenchyma. Trace perinephric  fluid. No mass or hydronephrosis. Bladder: Appears normal for degree of bladder distention. Other: Small right pleural effusion. IMPRESSION: Echogenic renal parenchyma, suggesting medical renal disease. No hydronephrosis. Electronically Signed   By: Julian Hy M.D.   On: 09/03/2021 20:49   US Venous Img Lower Bilateral (DVT)  Result Date: 09/04/2021 CLINICAL DATA:  Bilateral lower extremity edema.  Evaluate for DVT. EXAM: BILATERAL LOWER EXTREMITY VENOUS DOPPLER ULTRASOUND TECHNIQUE: Gray-scale sonography with graded compression, as well as color Doppler and duplex ultrasound were performed to evaluate the lower extremity deep venous systems from the level of the common femoral vein and including the common femoral, femoral, profunda femoral, popliteal and calf veins including the posterior tibial, peroneal and gastrocnemius veins when visible. The superficial great saphenous vein was also interrogated. Spectral Doppler was utilized to evaluate flow at rest and with distal augmentation maneuvers in the common femoral, femoral and popliteal veins. COMPARISON:  None. FINDINGS: RIGHT LOWER EXTREMITY Common Femoral Vein: No evidence of thrombus. Normal compressibility, respiratory phasicity and response to augmentation. Saphenofemoral Junction: No evidence of thrombus. Normal compressibility and flow on color Doppler imaging. Profunda Femoral Vein: No evidence of thrombus. Normal compressibility and flow on color Doppler imaging. Femoral Vein: No evidence of thrombus. Normal compressibility, respiratory phasicity and response to augmentation. Popliteal Vein: No evidence of thrombus. Normal compressibility, respiratory phasicity and response to augmentation. Calf Veins: No evidence of thrombus. Normal compressibility and flow on color Doppler imaging. Superficial Great Saphenous Vein: No evidence of thrombus. Normal compressibility. Venous Reflux:  None. Other Findings: There is a moderate amount  of  subcutaneous edema at the level the right calf (image 10). LEFT LOWER EXTREMITY Common Femoral Vein: No evidence of thrombus. Normal compressibility, respiratory phasicity and response to augmentation. Saphenofemoral Junction: No evidence of thrombus. Normal compressibility and flow on color Doppler imaging. Profunda Femoral Vein: No evidence of thrombus. Normal compressibility and flow on color Doppler imaging. Femoral Vein: No evidence of thrombus. Normal compressibility, respiratory phasicity and response to augmentation. Popliteal Vein: No evidence of thrombus. Normal compressibility, respiratory phasicity and response to augmentation. Calf Veins: No evidence of thrombus. Normal compressibility and flow on color Doppler imaging. Superficial Great Saphenous Vein: No evidence of thrombus. Normal compressibility. Venous Reflux:  None. Other Findings:  None. IMPRESSION: No evidence of DVT within either lower extremity. Electronically Signed   By: Sandi Mariscal M.D.   On: 09/04/2021 07:04   US ARTERIAL ABI (SCREENING LOWER EXTREMITY)  Result Date: 09/04/2021 CLINICAL DATA:  60 year old male with bilateral lower extremity edema. EXAM: NONINVASIVE PHYSIOLOGIC VASCULAR STUDY OF BILATERAL LOWER EXTREMITIES TECHNIQUE: Evaluation of both lower extremities were performed at rest, including calculation of ankle-brachial indices with single level Doppler, pressure and pulse volume recording. COMPARISON:  None. FINDINGS: Right ABI:  Noncompressible. Left ABI:  Noncompressible Right Lower Extremity:  Monophasic arterial waveforms at the ankle. Left Lower Extremity:  Monophasic arterial waveforms at the ankle. IMPRESSION: Noncompressible lower extremity arteries at the level of the ankle, unable to calculate ankle-brachial indices. Monophasic waveforms bilaterally suggest bilateral lower extremity peripheral artery disease. Consider lower extremity duplex, CTA runoff, or direct angiography for further evaluation. Ruthann Cancer, MD [facility] Electronically Signed   By: Ruthann Cancer M.D.   On: 09/04/2021 10:19   Anti-infectives: Anti-infectives (From admission, onward)    Start     Dose/Rate Route Frequency Ordered Stop   09/05/21 0300  cefTRIAXone (ROCEPHIN) 1 g in sodium chloride 0.9 % 100 mL IVPB        1 g 200 mL/hr over 30 Minutes Intravenous Every 24 hours 09/04/21 1906 09/11/21 0259   09/03/21 2200  ceFEPIme (MAXIPIME) 2 g in sodium chloride 0.9 % 100 mL IVPB  Status:  Discontinued        2 g 200 mL/hr over 30 Minutes Intravenous Every 24 hours 09/03/21 2028 09/04/21 1906   09/03/21 2032  vancomycin variable dose per unstable renal function (pharmacist dosing)         Does not apply See admin instructions 09/03/21 2032     09/03/21 2030  vancomycin (VANCOCIN) IVPB 1000 mg/200 mL premix  Status:  Discontinued        1,000 mg 200 mL/hr over 60 Minutes Intravenous  Once 09/03/21 2028 09/03/21 2032   09/03/21 2000  vancomycin (VANCOREADY) IVPB 1500 mg/300 mL        1,500 mg 150 mL/hr over 120 Minutes Intravenous  Once 09/03/21 1921 09/04/21 0200       Assessment/Plan: s/p * No surgery found * At this point in time we will plan for arteriogram tomorrow, tentatively. Creatinine remains elevated, judicious administration of contrast would be necessary.  Will make n.p.o. after midnight if this can be safely pursued tomorrow.  All questions answered.  LOS: 3 days   Zara Chess 09/06/2021, 8:23 AM

## 2021-09-06 NOTE — Plan of Care (Signed)

## 2021-09-06 NOTE — Progress Notes (Signed)
PROGRESS NOTE    Nathan [name]  ZOX:[id number] DOB: 1961/01/09 DOA: 09/03/2021 PCP: Derinda Late, MD  Chief Complaint  Patient presents with   Foot Pain    Brief Narrative:  60 y.o. male with medical history significant for type 2 diabetes mellitus, hypertension, paroxysmal atrial fibrillation(he stopped taking Eliquis on his own due to bleeding), combined systolic and diastolic CHF and stage III chronic kidney disease, who presented to the ER with acute onset of bleeding from his left calf wound.      Assessment & Plan:   Principal Problem:   Left leg cellulitis Active Problems:   Non-healing wound of left lower extremity   Acute blood loss anemia   Acute kidney injury superimposed on CKD (HCC)   Acute on chronic HFrEF (heart failure with reduced ejection fraction) (HCC)   Atrial fibrillation, chronic (HCC)   Diabetes mellitus with diabetic neuropathy (HCC)   Essential hypertension   Hyperlipidemia   * Left leg cellulitis Continue abx, will narrow to vanc/ceftriaxone for now improving No crepitus, fluctuance, continue to monitor, low threshold for additional imaging ABI with noncompressible lower extremity arteries - monophasic waveforms bilaterally suggest bilateral LE PAD Plan for arteriogram with vascular 12/19 Palpable distal DP pulses, appreciate vascular assistance given his wound  Non-healing wound of left lower extremity Started with ruptured blister about 1 month ago Notably with peripheral neuropathy from below knees bilaterally  Appreciate wound care assistance and vascular assistance Vascular considering angiogram  Acute blood loss anemia Chronic, but likely worsened recently with bleeding from wound He's stopped his eliquis - on aspirin, indication not totally clear to me, will hold Follow anemia labs S/p 1 unit pRBC - inappropriate bump stable  Anemia labs c/w AOCD  Acute kidney injury superimposed on CKD (HCC) AKI on CKD IV Baseline  creatinine appears to be around 3.8 Presented with creatinine of 5.45 Creatinine 5.28 today Renal US with medical renal disease UA with >300 mg protein, 0-5 RBC's Appreciate renal assistance, they're considering dialysis - planning for peritoneal catheter placement   Acute on chronic HFrEF (heart failure with reduced ejection fraction) (HCC) Doesn't appear grossly overloaded at this time On coreg, imdur and hydralazine Lasix and metolazone on hold  Atrial fibrillation, chronic (HCC) eliquis on hold with bleeding, anemia (he notes he hasn't taken eliquis for maybe 6 months)  Diabetes mellitus with diabetic neuropathy ([place]) Bilateral peripheral neuropathy below knees A1c 02/2021 5.6, follow SSI  Hyperlipidemia statin  Essential hypertension Coreg, hydralazine, imdur   DVT prophylaxis: SCD Code Status: full Family Communication: none at bedside Disposition:   Status is: Inpatient  Remains inpatient appropriate because: IV abx, renal and vascular evals       Consultants:  Vascular, renal  Procedures:  LE [place] MPRESSION: No evidence of DVT within either lower extremity.  Antimicrobials:  Anti-infectives (From admission, onward)    Start     Dose/Rate Route Frequency Ordered Stop   09/05/21 0300  cefTRIAXone (ROCEPHIN) 1 g in sodium chloride 0.9 % 100 mL IVPB        1 g 200 mL/hr over 30 Minutes Intravenous Every 24 hours 09/04/21 1906 09/11/21 0259   09/03/21 2200  ceFEPIme (MAXIPIME) 2 g in sodium chloride 0.9 % 100 mL IVPB  Status:  Discontinued        2 g 200 mL/hr over 30 Minutes Intravenous Every 24 hours 09/03/21 2028 09/04/21 1906   09/03/21 2032  vancomycin variable dose per unstable renal function (pharmacist dosing)  Does not apply See admin instructions 09/03/21 2032     09/03/21 2030  vancomycin (VANCOCIN) IVPB 1000 mg/200 mL premix  Status:  Discontinued        1,000 mg 200 mL/hr over 60 Minutes Intravenous  Once 09/03/21 2028 09/03/21 2032    09/03/21 2000  vancomycin (VANCOREADY) IVPB 1500 mg/300 mL        1,500 mg 150 mL/hr over 120 Minutes Intravenous  Once 09/03/21 1921 09/04/21 0200       Subjective: No new complaints  Objective: Vitals:   09/06/21 0330 09/06/21 0816 09/06/21 1154 09/06/21 1158  BP: (!) 148/82 139/69 124/68 (!) 149/88  Pulse: 65 69 61 81  Resp: 17 18 19 17   Temp: 98.2 F (36.8 C) 98.4 F (36.9 C) 97.7 F (36.5 C) 98 F (36.7 C)  TempSrc:  Oral    SpO2: 100% 100% 96% 99%  Weight:      Height:        Intake/Output Summary (Last 24 hours) at 09/06/2021 1226 Last data filed at 09/06/2021 0343 Gross per 24 hour  Intake 605.44 ml  Output 200 ml  Net 405.44 ml   Filed Weights   09/03/21 1824  Weight: 65.8 kg    Examination:  General: No acute distress. Cardiovascular: unlabored Lungs: RRR Abdomen: Soft, nontender, nondistended Neurological: Alert and oriented 3. Moves all extremities 4 . Cranial nerves II through XII grossly intact. Skin: Warm and dry. No rashes or lesions. Extremities: dressing in place   Data Reviewed: I have personally reviewed following labs and imaging studies  CBC: Recent Labs  Lab 09/03/21 1858 09/04/21 0424 09/05/21 0813 09/05/21 1603 09/06/21 0335  WBC 8.4 8.5 6.8  --  6.6  NEUTROABS 6.6  --  5.4  --  5.2  HGB 7.4* 6.9* 7.3* 7.5* 7.4*  HCT 22.7* 21.5* 22.4* 22.7* 22.8*  MCV 99.6 97.3 92.6  --  91.2  PLT 535* 448* 333  --  562    Basic Metabolic Panel: Recent Labs  Lab 09/03/21 1858 09/04/21 0424 09/05/21 0323 09/05/21 0813 09/06/21 0335  NA 141 142  --  141 138  K 3.9 3.9  --  4.2 4.3  CL 111 114*  --  112* 111  CO2 21* 21*  --  19* 18*  GLUCOSE 229* 104*  --  103* 90  BUN 101* 106*  --  107* 101*  CREATININE 5.45* 5.17* 5.45* 5.41* 5.28*  CALCIUM 8.0* 7.7*  --  7.5* 8.1*  MG  --   --   --   --  2.6*  PHOS  --   --   --   --  6.2*    GFR: Estimated Creatinine Clearance: 14.2 mL/min (Morrison Masser) (by C-G formula based on SCr of 5.28  mg/dL (H)).  Liver Function Tests: Recent Labs  Lab 09/05/21 0813 09/06/21 0335  AST 19 18  ALT 18 17  ALKPHOS 197* 191*  BILITOT 0.6 0.7  PROT 4.7* 5.0*  ALBUMIN 2.0* 2.2*    CBG: Recent Labs  Lab 09/05/21 2034 09/06/21 0045 09/06/21 0405 09/06/21 0807 09/06/21 1156  GLUCAP 103* 85 79 104* 82     Recent Results (from the past 240 hour(s))  Resp Panel by RT-PCR (Flu Rashawna Scoles&B, Covid) Nasopharyngeal Swab     Status: None   Collection Time: 09/03/21  8:30 PM   Specimen: Nasopharyngeal Swab; Nasopharyngeal(NP) swabs in vial transport medium  Result Value Ref Range Status   SARS Coronavirus 2 by RT PCR NEGATIVE  NEGATIVE Final    Comment: (NOTE) SARS-CoV-2 target nucleic acids are NOT DETECTED.  The SARS-CoV-2 RNA is generally detectable in upper respiratory specimens during the acute phase of infection. The lowest concentration of SARS-CoV-2 viral copies this assay can detect is 138 copies/mL. Shalie Schremp negative result does not preclude SARS-Cov-2 infection and should not be used as the sole basis for treatment or other patient management decisions. Briseis Aguilera negative result may occur with  improper specimen collection/handling, submission of specimen other than nasopharyngeal swab, presence of viral mutation(s) within the areas targeted by this assay, and inadequate number of viral copies(<138 copies/mL). Iban Utz negative result must be combined with clinical observations, patient history, and epidemiological information. The expected result is Negative.  Fact Sheet for Patients:  EntrepreneurPulse.com.au  Fact Sheet for Healthcare Providers:  IncredibleEmployment.be  This test is no t yet approved or cleared by the [place] FDA and  has been authorized for detection and/or diagnosis of SARS-CoV-2 by FDA under an Emergency Use Authorization (EUA). This EUA will remain  in effect (meaning this test can be used) for the duration of the COVID-19  declaration under Section 564(b)(1) of the Act, 21 U.S.C.section 360bbb-3(b)(1), unless the authorization is terminated  or revoked sooner.       Influenza Crystall Donaldson by PCR NEGATIVE NEGATIVE Final   Influenza B by PCR NEGATIVE NEGATIVE Final    Comment: (NOTE) The Xpert Xpress SARS-CoV-2/FLU/RSV plus assay is intended as an aid in the diagnosis of influenza from Nasopharyngeal swab specimens and should not be used as Sharran Caratachea sole basis for treatment. Nasal washings and aspirates are unacceptable for Xpert Xpress SARS-CoV-2/FLU/RSV testing.  Fact Sheet for Patients: EntrepreneurPulse.com.au  Fact Sheet for Healthcare Providers: IncredibleEmployment.be  This test is not yet approved or cleared by the [place] FDA and has been authorized for detection and/or diagnosis of SARS-CoV-2 by FDA under an Emergency Use Authorization (EUA). This EUA will remain in effect (meaning this test can be used) for the duration of the COVID-19 declaration under Section 564(b)(1) of the Act, 21 U.S.C. section 360bbb-3(b)(1), unless the authorization is terminated or revoked.  Performed at [facility], [address] [place], [place] [address]   MRSA Next Gen by PCR, Nasal     Status: None   Collection Time: 09/04/21 12:39 PM   Specimen: Nasal Mucosa; Nasal Swab  Result Value Ref Range Status   MRSA by PCR Next Gen NOT DETECTED NOT DETECTED Final    Comment: (NOTE) The GeneXpert MRSA Assay (FDA approved for NASAL specimens only), is one component of Shanetta Nicolls comprehensive MRSA colonization surveillance program. It is not intended to diagnose MRSA infection nor to guide or monitor treatment for MRSA infections. Test performance is not FDA approved in patients less than 37 years old. Performed at [facility], [address], [place],  [address]          Radiology Studies: No results found.      Scheduled Meds:  atorvastatin  40 mg  Oral QHS   carvedilol  25 mg Oral BID WC   ferrous sulfate  325 mg Oral Q breakfast   heparin injection (subcutaneous)  5,000 Units Subcutaneous Q8H   hydrALAZINE  50 mg Oral TID   insulin aspart  0-15 Units Subcutaneous Q4H   isosorbide mononitrate  30 mg Oral BID   multivitamin with minerals  1 tablet Oral Daily   vancomycin variable dose per unstable renal function (pharmacist dosing)   Does not apply See admin instructions  Continuous Infusions:  sodium chloride 10 mL/hr at 09/06/21 0555   cefTRIAXone (ROCEPHIN)  IV 1 g (09/06/21 0556)     LOS: 3 days    Time spent: over 30 min    Fayrene Helper, MD [facility]   To contact the attending provider between 7A-7P or the covering provider during after hours 7P-7A, please log into the web site [email or web address] and access using universal [place] password for that web site. If you do not have the password, please call the hospital operator.  09/06/2021, 12:26 PM

## 2021-09-06 NOTE — Consult Note (Signed)
Pharmacy Antibiotic Note  Nathan [name] is a 60 y.o. male with medical history including HTN, HLD, Afib, CKD stage IV, CHF, PVD admitted on 09/03/2021 with  LLE cellulitis . Patient has a history of MRSA infections. Pharmacy has been consulted for cefepime and vancomycin dosing.  Plan: On ceftriaxone 1 g daily x 7 days total.   Pt received vancomycin 1500 mg x 1 on 12/15 @2344 . Random level on 12/18 @0335  was obtained (15 ug/mL). Will hold off re-dosing vancomycin today. Will order another vancomycin random for tomorrow. F/u with RRT and adjust vancomycin if necessary, potential plan for PD. Trough goal for cellulitis is typically 10-15 ug/ml.   Height: 5\' 9"  (175.3 cm) Weight: 65.8 kg (145 lb) IBW/kg (Calculated) : 70.7  Temp (24hrs), Avg:98 F (36.7 C), Min:97.5 F (36.4 C), Max:98.4 F (36.9 C)  Recent Labs  Lab 09/03/21 1858 09/04/21 0424 09/05/21 0323 09/05/21 0813 09/06/21 0335  WBC 8.4 8.5  --  6.8 6.6  CREATININE 5.45* 5.17* 5.45* 5.41* 5.28*  VANCORANDOM  --   --  18  --  15     Estimated Creatinine Clearance: 14.2 mL/min (A) (by C-G formula based on SCr of 5.28 mg/dL (H)).    No Known Allergies  Antimicrobials this admission: Vancomycin 12/15 >>  Cefepime 12/15 >> 12/16  Dose adjustments this admission: N/A  Microbiology results: 12/15 Wound Cx: Pending  Thank you for allowing pharmacy to be a part of this patients care.  Oswald Hillock, PharmD, BCPS 09/06/2021 9:38 AM

## 2021-09-06 NOTE — Progress Notes (Addendum)
[facility]  ROUNDING NOTE   Subjective:   Nathan [name] is a 60 year old male with past medical conditions including diabetes, paroxysmal A. fib, diabetes, systolic and diastolic chronic heart failure, hypertension, and chronic kidney disease stage IV.  Patient reports to the emergency department with complaints of bleeding from his left calf wound.  Patient has been admitted for Hyperglycemia [R73.9] Cellulitis of left lower extremity [L03.116] AKI (acute kidney injury) ([place]) [N17.9] Left leg cellulitis [R67.893]  Patient is known to our office and is followed Dr. Lanora Manis.  He was last seen in the office March 25, 2021 for routine follow-up.    Update: Patient resting in bed Complains of nausea  Denies shortness of breath   Objective:  Vital signs in last 24 hours:  Temp:  [97.5 F (36.4 C)-98.4 F (36.9 C)] 98.4 F (36.9 C) (12/18 0816) Pulse Rate:  [59-69] 69 (12/18 0816) Resp:  [17-18] 18 (12/18 0816) BP: (125-148)/(66-82) 139/69 (12/18 0816) SpO2:  [97 %-100 %] 100 % (12/18 0816)  Weight change:  Filed Weights   09/03/21 1824  Weight: 65.8 kg    Intake/Output: I/O last 3 completed shifts: In: 725.4 [P.O.:600; I.V.:25.4; IV Piggyback:100] Out: 500 [Urine:500]   Intake/Output this shift:  No intake/output data recorded.  Physical Exam: General: NAD, resting in bed  Head: Normocephalic, atraumatic. Moist oral mucosal membranes  Eyes: Anicteric  Lungs:  Clear to auscultation, normal effort, room air  Heart: Regular rate and rhythm  Abdomen:  Soft, nontender, nondistended  Extremities: trace peripheral edema.  Neurologic: Nonfocal, moving all four extremities  Skin: LLE wound       Basic Metabolic Panel: Recent Labs  Lab 09/03/21 1858 09/04/21 0424 09/05/21 0323 09/05/21 0813 09/06/21 0335  NA 141 142  --  141 138  K 3.9 3.9  --  4.2 4.3  CL 111 114*  --  112* 111  CO2 21* 21*  --  19* 18*  GLUCOSE 229* 104*  --  103* 90  BUN  101* 106*  --  107* 101*  CREATININE 5.45* 5.17* 5.45* 5.41* 5.28*  CALCIUM 8.0* 7.7*  --  7.5* 8.1*  MG  --   --   --   --  2.6*  PHOS  --   --   --   --  6.2*     Liver Function Tests: Recent Labs  Lab 09/05/21 0813 09/06/21 0335  AST 19 18  ALT 18 17  ALKPHOS 197* 191*  BILITOT 0.6 0.7  PROT 4.7* 5.0*  ALBUMIN 2.0* 2.2*   No results for input(s): LIPASE, AMYLASE in the last 168 hours. No results for input(s): AMMONIA in the last 168 hours.  CBC: Recent Labs  Lab 09/03/21 1858 09/04/21 0424 09/05/21 0813 09/05/21 1603 09/06/21 0335  WBC 8.4 8.5 6.8  --  6.6  NEUTROABS 6.6  --  5.4  --  5.2  HGB 7.4* 6.9* 7.3* 7.5* 7.4*  HCT 22.7* 21.5* 22.4* 22.7* 22.8*  MCV 99.6 97.3 92.6  --  91.2  PLT 535* 448* 333  --  338     Cardiac Enzymes: No results for input(s): CKTOTAL, CKMB, CKMBINDEX, TROPONINI in the last 168 hours.  BNP: Invalid input(s): POCBNP  CBG: Recent Labs  Lab 09/05/21 1531 09/05/21 2034 09/06/21 0045 09/06/21 0405 09/06/21 0807  GLUCAP 162* 103* 85 18 104*     Microbiology: Results for orders placed or performed during the hospital encounter of 09/03/21  Resp Panel by RT-PCR (Flu A&B,  Covid) Nasopharyngeal Swab     Status: None   Collection Time: 09/03/21  8:30 PM   Specimen: Nasopharyngeal Swab; Nasopharyngeal(NP) swabs in vial transport medium  Result Value Ref Range Status   SARS Coronavirus 2 by RT PCR NEGATIVE NEGATIVE Final    Comment: (NOTE) SARS-CoV-2 target nucleic acids are NOT DETECTED.  The SARS-CoV-2 RNA is generally detectable in upper respiratory specimens during the acute phase of infection. The lowest concentration of SARS-CoV-2 viral copies this assay can detect is 138 copies/mL. A negative result does not preclude SARS-Cov-2 infection and should not be used as the sole basis for treatment or other patient management decisions. A negative result may occur with  improper specimen collection/handling, submission of  specimen other than nasopharyngeal swab, presence of viral mutation(s) within the areas targeted by this assay, and inadequate number of viral copies(<138 copies/mL). A negative result must be combined with clinical observations, patient history, and epidemiological information. The expected result is Negative.  Fact Sheet for Patients:  EntrepreneurPulse.com.au  Fact Sheet for Healthcare Providers:  IncredibleEmployment.be  This test is no t yet approved or cleared by the [place] FDA and  has been authorized for detection and/or diagnosis of SARS-CoV-2 by FDA under an Emergency Use Authorization (EUA). This EUA will remain  in effect (meaning this test can be used) for the duration of the COVID-19 declaration under Section 564(b)(1) of the Act, 21 U.S.C.section 360bbb-3(b)(1), unless the authorization is terminated  or revoked sooner.       Influenza A by PCR NEGATIVE NEGATIVE Final   Influenza B by PCR NEGATIVE NEGATIVE Final    Comment: (NOTE) The Xpert Xpress SARS-CoV-2/FLU/RSV plus assay is intended as an aid in the diagnosis of influenza from Nasopharyngeal swab specimens and should not be used as a sole basis for treatment. Nasal washings and aspirates are unacceptable for Xpert Xpress SARS-CoV-2/FLU/RSV testing.  Fact Sheet for Patients: EntrepreneurPulse.com.au  Fact Sheet for Healthcare Providers: IncredibleEmployment.be  This test is not yet approved or cleared by the [place] FDA and has been authorized for detection and/or diagnosis of SARS-CoV-2 by FDA under an Emergency Use Authorization (EUA). This EUA will remain in effect (meaning this test can be used) for the duration of the COVID-19 declaration under Section 564(b)(1) of the Act, 21 U.S.C. section 360bbb-3(b)(1), unless the authorization is terminated or revoked.  Performed at [facility], [address] [place], [place] [address]   MRSA Next Gen by PCR, Nasal     Status: None   Collection Time: 09/04/21 12:39 PM   Specimen: Nasal Mucosa; Nasal Swab  Result Value Ref Range Status   MRSA by PCR Next Gen NOT DETECTED NOT DETECTED Final    Comment: (NOTE) The GeneXpert MRSA Assay (FDA approved for NASAL specimens only), is one component of a comprehensive MRSA colonization surveillance program. It is not intended to diagnose MRSA infection nor to guide or monitor treatment for MRSA infections. Test performance is not FDA approved in patients less than 59 years old. Performed at [facility], [address] [place], [place] [address]     Coagulation Studies: No results for input(s): LABPROT, INR in the last 72 hours.  Urinalysis: No results for input(s): COLORURINE, LABSPEC, PHURINE, GLUCOSEU, HGBUR, BILIRUBINUR, KETONESUR, PROTEINUR, UROBILINOGEN, NITRITE, LEUKOCYTESUR in the last 72 hours.  Invalid input(s): APPERANCEUR     Imaging: US ARTERIAL ABI (SCREENING LOWER EXTREMITY)  Result Date: 09/04/2021 CLINICAL DATA:  60 year old male with bilateral lower extremity edema. EXAM:  NONINVASIVE PHYSIOLOGIC VASCULAR STUDY OF BILATERAL LOWER EXTREMITIES TECHNIQUE: Evaluation of both lower extremities were performed at rest, including calculation of ankle-brachial indices with single level Doppler, pressure and pulse volume recording. COMPARISON:  None. FINDINGS: Right ABI:  Noncompressible. Left ABI:  Noncompressible Right Lower Extremity:  Monophasic arterial waveforms at the ankle. Left Lower Extremity:  Monophasic arterial waveforms at the ankle. IMPRESSION: Noncompressible lower extremity arteries at the level of the ankle, unable to calculate ankle-brachial indices. Monophasic waveforms bilaterally suggest bilateral lower extremity peripheral artery disease. Consider lower extremity duplex, CTA runoff, or direct angiography for further evaluation. Ruthann Cancer, MD [facility] Electronically Signed   By: Ruthann Cancer M.D.   On: 09/04/2021 10:19     Medications:    sodium chloride 10 mL/hr at 09/06/21 0555   cefTRIAXone (ROCEPHIN)  IV 1 g (09/06/21 0556)    atorvastatin  40 mg Oral QHS   carvedilol  25 mg Oral BID WC   ferrous sulfate  325 mg Oral Q breakfast   heparin injection (subcutaneous)  5,000 Units Subcutaneous Q8H   hydrALAZINE  50 mg Oral TID   insulin aspart  0-15 Units Subcutaneous Q4H   isosorbide mononitrate  30 mg Oral BID   multivitamin with minerals  1 tablet Oral Daily   vancomycin variable dose per unstable renal function (pharmacist dosing)   Does not apply See admin instructions   sodium chloride, acetaminophen **OR** acetaminophen, morphine injection, ondansetron **OR** ondansetron (ZOFRAN) IV, traZODone  Assessment/ Plan:  [name] is a 60 y.o.  male with past medical conditions including diabetes, paroxysmal A. fib, diabetes, systolic and diastolic chronic heart failure, hypertension, and chronic kidney disease stage IV.  Patient reports to the emergency department with complaints of bleeding from his left calf wound.  Patient has been admitted for Hyperglycemia [R73.9] Cellulitis of left lower extremity [L03.116] AKI (acute kidney injury) ([place]) [N17.9] Left leg cellulitis [O17.510]   Acute Kidney Injury on chronic kidney disease stage IV with baseline creatinine 3.98 and GFR of 16 on 03/25/21.  Acute kidney injury secondary likely to aggressive diuresis Continue to hold diuretics Renal ultrasound negative for obstruction, No IV contrast exposure Patient has been on aggressive diuresis outpatient, furosemide 80 mg twice daily and metolazone 2.5 mg twice daily.    -Renal function remains at 12% with decreased urine output  -Patient agreeable to proceed with peritoneal catheter placement. Will consult surgery for placement. Will order hepatitis labs in preparation  for renal replacement therapy.      Lab Results  Component Value Date   CREATININE 5.28 (H) 09/06/2021   CREATININE 5.41 (H) 09/05/2021   CREATININE 5.45 (H) 09/05/2021    Intake/Output Summary (Last 24 hours) at 09/06/2021 1008 Last data filed at 09/06/2021 0343 Gross per 24 hour  Intake 725.44 ml  Output 200 ml  Net 525.44 ml    2. Anemia of chronic kidney disease Lab Results  Component Value Date   HGB 7.4 (L) 09/06/2021    We will consider EPO with dialysis initiation.  3. Secondary Hyperparathyroidism: with outpatient labs: phosphorus 5.5, calcium 7.8 on 03/25/21.   Lab Results  Component Value Date   CALCIUM 8.1 (L) 09/06/2021   CAION 1.05 (L) 06/26/2019   PHOS 6.2 (H) 09/06/2021    Phosphorus elevated. Will monitor and consider binders at later date.  4.  Chronic systolic heart failure.  Echo from 03/14/2021 shows EF 40 to 45% with a grade 2  diastolic dysfunction and mild LVH.  Placed on furosemide and metolazone (outpatient) as above for fluid management.    -Diuretics held  5. LLE cellulitis, Wound culture obtained Vascular consulted for angiogram, concerned with increased renal function. Will contact vascular and suggest proceeding with procedure since patient will be placed on dialysis.  Receiving Ceftriaxone and Vanc   LOS: 3 Nathan [name] 12/18/202210:08 AM

## 2021-09-07 ENCOUNTER — Encounter
Admission: EM | Disposition: A | Discharge status: Home / Self Care | Payer: Self-pay | Source: Home / Self Care | Attending: Family Medicine

## 2021-09-07 ENCOUNTER — Inpatient Hospital Stay: Payer: Medicaid Other

## 2021-09-07 ENCOUNTER — Other Ambulatory Visit (INDEPENDENT_AMBULATORY_CARE_PROVIDER_SITE_OTHER): Payer: Self-pay | Admitting: Vascular Surgery

## 2021-09-07 DIAGNOSIS — I70248 Atherosclerosis of native arteries of left leg with ulceration of other part of lower left leg: Secondary | ICD-10-CM

## 2021-09-07 DIAGNOSIS — E1122 Type 2 diabetes mellitus with diabetic chronic kidney disease: Secondary | ICD-10-CM

## 2021-09-07 DIAGNOSIS — N186 End stage renal disease: Secondary | ICD-10-CM

## 2021-09-07 DIAGNOSIS — R9389 Abnormal findings on diagnostic imaging of other specified body structures: Secondary | ICD-10-CM

## 2021-09-07 HISTORY — PX: LOWER EXTREMITY ANGIOGRAPHY: CATH118251

## 2021-09-07 LAB — COMPREHENSIVE METABOLIC PANEL
ALT: 15 U/L (ref 0–44)
AST: 17 U/L (ref 15–41)
Albumin: 2.1 g/dL — ABNORMAL LOW (ref 3.5–5.0)
Alkaline Phosphatase: 194 U/L — ABNORMAL HIGH (ref 38–126)
Anion gap: 8 (ref 5–15)
BUN: 118 mg/dL — ABNORMAL HIGH (ref 6–20)
CO2: 19 mmol/L — ABNORMAL LOW (ref 22–32)
Calcium: 7.9 mg/dL — ABNORMAL LOW (ref 8.9–10.3)
Chloride: 113 mmol/L — ABNORMAL HIGH (ref 98–111)
Creatinine, Ser: 5.49 mg/dL — ABNORMAL HIGH (ref 0.61–1.24)
GFR, Estimated: 11 mL/min — ABNORMAL LOW (ref >60)
Glucose, Bld: 98 mg/dL (ref 70–99)
Potassium: 4.4 mmol/L (ref 3.5–5.1)
Sodium: 140 mmol/L (ref 135–145)
Total Bilirubin: 0.7 mg/dL (ref 0.3–1.2)
Total Protein: 4.6 g/dL — ABNORMAL LOW (ref 6.5–8.1)

## 2021-09-07 LAB — CBC WITH DIFFERENTIAL/PLATELET
Abs Immature Granulocytes: 0.07 10*3/uL (ref 0.00–0.07)
Basophils Absolute: 0.1 10*3/uL (ref 0.0–0.1)
Basophils Relative: 1 %
Eosinophils Absolute: 0.2 10*3/uL (ref 0.0–0.5)
Eosinophils Relative: 3 %
HCT: 21.7 % — ABNORMAL LOW (ref 39.0–52.0)
Hemoglobin: 7.1 g/dL — ABNORMAL LOW (ref 13.0–17.0)
Immature Granulocytes: 1 %
Lymphocytes Relative: 5 %
Lymphs Abs: 0.3 10*3/uL — ABNORMAL LOW (ref 0.7–4.0)
MCH: 29.8 pg (ref 26.0–34.0)
MCHC: 32.7 g/dL (ref 30.0–36.0)
MCV: 91.2 fL (ref 80.0–100.0)
Monocytes Absolute: 0.6 10*3/uL (ref 0.1–1.0)
Monocytes Relative: 10 %
Neutro Abs: 5.1 10*3/uL (ref 1.7–7.7)
Neutrophils Relative %: 80 %
Platelets: 323 10*3/uL (ref 150–400)
RBC: 2.38 MIL/uL — ABNORMAL LOW (ref 4.22–5.81)
RDW: 17.5 % — ABNORMAL HIGH (ref 11.5–15.5)
WBC: 6.3 10*3/uL (ref 4.0–10.5)
nRBC: 0 % (ref 0.0–0.2)

## 2021-09-07 LAB — GLUCOSE, CAPILLARY
Glucose-Capillary: 140 mg/dL — ABNORMAL HIGH (ref 70–99)
Glucose-Capillary: 99 mg/dL (ref 70–99)
Glucose-Capillary: 98 mg/dL (ref 70–99)
Glucose-Capillary: 82 mg/dL (ref 70–99)
Glucose-Capillary: 86 mg/dL (ref 70–99)
Glucose-Capillary: 78 mg/dL (ref 70–99)
Glucose-Capillary: 128 mg/dL — ABNORMAL HIGH (ref 70–99)

## 2021-09-07 LAB — MAGNESIUM: Magnesium: 2.6 mg/dL — ABNORMAL HIGH (ref 1.7–2.4)

## 2021-09-07 LAB — VANCOMYCIN, RANDOM: Vancomycin Rm: 14

## 2021-09-07 LAB — PHOSPHORUS: Phosphorus: 6.9 mg/dL — ABNORMAL HIGH (ref 2.5–4.6)

## 2021-09-07 LAB — HEMOGLOBIN AND HEMATOCRIT, BLOOD
HCT: 23.4 % — ABNORMAL LOW (ref 39.0–52.0)
Hemoglobin: 7.7 g/dL — ABNORMAL LOW (ref 13.0–17.0)

## 2021-09-07 SURGERY — LOWER EXTREMITY ANGIOGRAPHY
Anesthesia: Moderate Sedation | Laterality: Left

## 2021-09-07 MED ORDER — FENTANYL CITRATE (PF) 100 MCG/2ML IJ SOLN
INTRAMUSCULAR | Status: DC | PRN
  Administered 2021-09-07: 50 ug via INTRAVENOUS

## 2021-09-07 MED ORDER — CEFAZOLIN SODIUM-DEXTROSE 2-4 GM/100ML-% IV SOLN: 2.0000 g | Freq: Once | INTRAVENOUS | Status: DC

## 2021-09-07 MED ORDER — DIPHENHYDRAMINE HCL 50 MG/ML IJ SOLN: 50.0000 mg | Freq: Once | INTRAMUSCULAR | Status: DC | PRN

## 2021-09-07 MED ORDER — MIDAZOLAM HCL 2 MG/2ML IJ SOLN
INTRAMUSCULAR | Status: AC
  Filled 2021-09-07: qty 2

## 2021-09-07 MED ORDER — SODIUM CHLORIDE 0.9 % IV SOLN: INTRAVENOUS | Status: DC

## 2021-09-07 MED ORDER — HYDROMORPHONE HCL 1 MG/ML IJ SOLN: 1.0000 mg | Freq: Once | INTRAMUSCULAR | Status: DC | PRN

## 2021-09-07 MED ORDER — METHYLPREDNISOLONE SODIUM SUCC 125 MG IJ SOLR: 125.0000 mg | Freq: Once | INTRAMUSCULAR | Status: DC | PRN

## 2021-09-07 MED ORDER — ONDANSETRON HCL 4 MG/2ML IJ SOLN
4.0000 mg | Freq: Four times a day (QID) | INTRAMUSCULAR | Status: DC | PRN
  Filled 2021-09-07: qty 2

## 2021-09-07 MED ORDER — EPOETIN ALFA 40000 UNIT/ML IJ SOLN
20000.0000 [IU] | Freq: Once | INTRAMUSCULAR | Status: AC
  Administered 2021-09-07: 14:00:00 20000 [IU] via SUBCUTANEOUS
  Filled 2021-09-07: qty 1

## 2021-09-07 MED ORDER — CEFAZOLIN SODIUM-DEXTROSE 1-4 GM/50ML-% IV SOLN
1.0000 g | Freq: Two times a day (BID) | INTRAVENOUS | Status: DC
  Administered 2021-09-08 – 2021-09-09 (×3): 1 g via INTRAVENOUS
  Filled 2021-09-07 (×4): qty 50

## 2021-09-07 MED ORDER — MIDAZOLAM HCL 2 MG/ML PO SYRP: 8.0000 mg | ORAL_SOLUTION | Freq: Once | ORAL | Status: DC | PRN

## 2021-09-07 MED ORDER — IODIXANOL 320 MG/ML IV SOLN
INTRAVENOUS | Status: DC | PRN
  Administered 2021-09-07: 17:00:00 30 mL via INTRA_ARTERIAL

## 2021-09-07 MED ORDER — MIDAZOLAM HCL 2 MG/2ML IJ SOLN
INTRAMUSCULAR | Status: DC | PRN
  Administered 2021-09-07: 2 mg via INTRAVENOUS

## 2021-09-07 MED ORDER — FENTANYL CITRATE PF 50 MCG/ML IJ SOSY
PREFILLED_SYRINGE | INTRAMUSCULAR | Status: AC
  Filled 2021-09-07: qty 1

## 2021-09-07 MED ORDER — HEPARIN SODIUM (PORCINE) 1000 UNIT/ML IJ SOLN
INTRAMUSCULAR | Status: AC
  Filled 2021-09-07: qty 10

## 2021-09-07 MED ORDER — FAMOTIDINE 20 MG PO TABS: 40.0000 mg | ORAL_TABLET | Freq: Once | ORAL | Status: DC | PRN

## 2021-09-07 MED ORDER — CEFAZOLIN SODIUM-DEXTROSE 1-4 GM/50ML-% IV SOLN: 1.0000 g | INTRAVENOUS | Status: DC

## 2021-09-07 SURGICAL SUPPLY — 9 items
CATH PIG 70CM (CATHETERS) ×2 IMPLANT
DEVICE STARCLOSE SE CLOSURE (Vascular Products) ×2 IMPLANT
GAUZE SPONGE 4X4 12PLY STRL (GAUZE/BANDAGES/DRESSINGS) ×2 IMPLANT
GLIDEWIRE ADV .035X260CM (WIRE) ×2 IMPLANT
PACK ANGIOGRAPHY (CUSTOM PROCEDURE TRAY) ×3 IMPLANT
SHEATH PINNACLE 5F 10CM (SHEATH) ×2 IMPLANT
SYR MEDRAD MARK 7 150ML (SYRINGE) ×2 IMPLANT
TUBING CONTRAST HIGH PRESS 72 (TUBING) ×2 IMPLANT
WIRE GUIDERIGHT .035X150 (WIRE) ×2 IMPLANT

## 2021-09-07 NOTE — Progress Notes (Signed)
PROGRESS NOTE    Nathan [name]  QIO:[id number] DOB: 1961/09/04 DOA: 09/03/2021 PCP: Derinda Late, MD  Chief Complaint  Patient presents with   Foot Pain    Brief Narrative:  60 y.o. male with medical history significant for type 2 diabetes mellitus, hypertension, paroxysmal atrial fibrillation(he stopped taking Eliquis on his own due to bleeding), combined systolic and diastolic CHF and stage III chronic kidney disease, who presented to the ER with acute onset of bleeding from his left calf wound.      Assessment & Plan:   Principal Problem:   Left leg cellulitis Active Problems:   Non-healing wound of left lower extremity   Acute blood loss anemia   Acute kidney injury superimposed on CKD (HCC)   Acute on chronic HFrEF (heart failure with reduced ejection fraction) (HCC)   Atrial fibrillation, chronic (HCC)   Diabetes mellitus with diabetic neuropathy (HCC)   Essential hypertension   Hyperlipidemia   Abnormal CXR   * Left leg cellulitis Narrow to ancef with improvement improving No crepitus, fluctuance, continue to monitor, low threshold for additional imaging ABI with noncompressible lower extremity arteries - monophasic waveforms bilaterally suggest bilateral LE PAD S/p aortogram and left lower extremity angiogram - no focal stenosis creating flow limitation Palpable distal DP pulses, appreciate vascular assistance given his wound  Non-healing wound of left lower extremity Started with ruptured blister about 1 month ago Notably with peripheral neuropathy from below knees bilaterally  Appreciate wound care assistance and vascular assistance Vascular considering angiogram  Acute blood loss anemia Chronic, but likely worsened recently with bleeding from wound He's stopped his eliquis - on aspirin, indication not totally clear to me, will hold S/p 1 unit pRBC - inappropriate bump stable  Anemia labs c/w AOCD  Acute kidney injury superimposed on CKD  (HCC) AKI on CKD IV Baseline creatinine appears to be around 3.8 Presented with creatinine of 5.45 Creatinine 5.28 today Renal US with medical renal disease UA with >300 mg protein, 0-5 RBC's Appreciate renal assistance, they're considering dialysis - planning for peritoneal catheter placement   Acute on chronic HFrEF (heart failure with reduced ejection fraction) (HCC) Doesn't appear grossly overloaded at this time On coreg, imdur and hydralazine Lasix and metolazone on hold  Atrial fibrillation, chronic (HCC) eliquis on hold with bleeding, anemia (he notes he hasn't taken eliquis for maybe 6 months)  Diabetes mellitus with diabetic neuropathy (HCC) Bilateral peripheral neuropathy below knees A1c 02/2021 5.6, follow SSI  Hyperlipidemia statin  Essential hypertension Coreg, hydralazine, imdur  Abnormal CXR Concern for pneumonia on todays x ray, clinically looks ok Will follow, repeat    DVT prophylaxis: SCD Code Status: full Family Communication: none at bedside Disposition:   Status is: Inpatient  Remains inpatient appropriate because: IV abx, renal and vascular evals       Consultants:  Vascular, renal  Procedures:  LE [place] MPRESSION: No evidence of DVT within either lower extremity.    Procedure(s) Performed:             1.  Ultrasound guidance for vascular access right femoral artery             2.  Catheter placement into left SFA from right femoral approach             3.  Aortogram and selective left lower extremity angiogram             4.  StarClose closure device right femoral artery  Antimicrobials:  Anti-infectives (From  admission, onward)    Start     Dose/Rate Route Frequency Ordered Stop   09/08/21 1000  ceFAZolin (ANCEF) IVPB 2g/100 mL premix  Status:  Discontinued        2 g 200 mL/hr over 30 Minutes Intravenous  Once 09/07/21 1322 09/07/21 1349   09/08/21 0400  [MAR Hold]  ceFAZolin (ANCEF) IVPB 1 g/50 mL premix        (MAR Hold  since Mon 09/07/2021 at 1644.Hold Reason: Transfer to [facility] Procedural area)   1 g 100 mL/hr over 30 Minutes Intravenous Every 12 hours 09/07/21 1057     09/08/21 0000  ceFAZolin (ANCEF) IVPB 1 g/50 mL premix  Status:  Discontinued       Note to Pharmacy: To be given in specials   1 g 100 mL/hr over 30 Minutes Intravenous 60 min pre-op 09/07/21 1650 09/07/21 1701   09/05/21 0300  cefTRIAXone (ROCEPHIN) 1 g in sodium chloride 0.9 % 100 mL IVPB  Status:  Discontinued        1 g 200 mL/hr over 30 Minutes Intravenous Every 24 hours 09/04/21 1906 09/07/21 1057   09/03/21 2200  ceFEPIme (MAXIPIME) 2 g in sodium chloride 0.9 % 100 mL IVPB  Status:  Discontinued        2 g 200 mL/hr over 30 Minutes Intravenous Every 24 hours 09/03/21 2028 09/04/21 1906   09/03/21 2032  vancomycin variable dose per unstable renal function (pharmacist dosing)  Status:  Discontinued         Does not apply See admin instructions 09/03/21 2032 09/07/21 1057   09/03/21 2030  vancomycin (VANCOCIN) IVPB 1000 mg/200 mL premix  Status:  Discontinued        1,000 mg 200 mL/hr over 60 Minutes Intravenous  Once 09/03/21 2028 09/03/21 2032   09/03/21 2000  vancomycin (VANCOREADY) IVPB 1500 mg/300 mL        1,500 mg 150 mL/hr over 120 Minutes Intravenous  Once 09/03/21 1921 09/04/21 0200       Subjective: No new complaints  Objective: Vitals:   09/07/21 1645 09/07/21 1732 09/07/21 1745 09/07/21 1801  BP: (!) 147/76 (!) 150/71 (!) 145/72 132/62  Pulse: 65 63  62  Resp: 15 17 (!) 21 12  Temp:      TempSrc: Oral     SpO2: 96% 96%  97%  Weight:      Height:        Intake/Output Summary (Last 24 hours) at 09/07/2021 1818 Last data filed at 09/07/2021 0900 Gross per 24 hour  Intake 240 ml  Output 350 ml  Net -110 ml   Filed Weights   09/03/21 1824  Weight: 65.8 kg    Examination:  General: No acute distress. Cardiovascular: RRR Lungs: unlabored Abdomen: Soft, nontender, nondistended Neurological: Alert and  oriented 3. Moves all extremities 4 . Cranial nerves II through XII grossly intact. Skin: Warm and dry. No rashes or lesions. Extremities: redness surrounding chronic LLE wound significantly improved, no crepitus, no fluctuance    Data Reviewed: I have personally reviewed following labs and imaging studies  CBC: Recent Labs  Lab 09/03/21 1858 09/04/21 0424 09/05/21 0813 09/05/21 1603 09/06/21 0335 09/07/21 0322 09/07/21 1516  WBC 8.4 8.5 6.8  --  6.6 6.3  --   NEUTROABS 6.6  --  5.4  --  5.2 5.1  --   HGB 7.4* 6.9* 7.3* 7.5* 7.4* 7.1* 7.7*  HCT 22.7* 21.5* 22.4* 22.7* 22.8* 21.7* 23.4*  MCV 99.6  97.3 92.6  --  91.2 91.2  --   PLT 535* 448* 333  --  338 323  --     Basic Metabolic Panel: Recent Labs  Lab 09/03/21 1858 09/04/21 0424 09/05/21 0323 09/05/21 0813 09/06/21 0335 09/07/21 0322  NA 141 142  --  141 138 140  K 3.9 3.9  --  4.2 4.3 4.4  CL 111 114*  --  112* 111 113*  CO2 21* 21*  --  19* 18* 19*  GLUCOSE 229* 104*  --  103* 90 98  BUN 101* 106*  --  107* 101* 118*  CREATININE 5.45* 5.17* 5.45* 5.41* 5.28* 5.49*  CALCIUM 8.0* 7.7*  --  7.5* 8.1* 7.9*  MG  --   --   --   --  2.6* 2.6*  PHOS  --   --   --   --  6.2* 6.9*    GFR: Estimated Creatinine Clearance: 13.7 mL/min (Dorrell [place]) (by C-G formula based on SCr of 5.49 mg/dL (H)).  Liver Function Tests: Recent Labs  Lab 09/05/21 0813 09/06/21 0335 09/07/21 0322  AST 19 18 17   ALT 18 17 15   ALKPHOS 197* 191* 194*  BILITOT 0.6 0.7 0.7  PROT 4.7* 5.0* 4.6*  ALBUMIN 2.0* 2.2* 2.1*    CBG: Recent Labs  Lab 09/07/21 0357 09/07/21 0813 09/07/21 1144 09/07/21 1513 09/07/21 1624  GLUCAP 99 98 82 86 78     Recent Results (from the past 240 hour(s))  Resp Panel by RT-PCR (Flu Ceejay Kegley&B, Covid) Nasopharyngeal Swab     Status: None   Collection Time: 09/03/21  8:30 PM   Specimen: Nasopharyngeal Swab; Nasopharyngeal(NP) swabs in vial transport medium  Result Value Ref Range Status   SARS Coronavirus 2 by RT  PCR NEGATIVE NEGATIVE Final    Comment: (NOTE) SARS-CoV-2 target nucleic acids are NOT DETECTED.  The SARS-CoV-2 RNA is generally detectable in upper respiratory specimens during the acute phase of infection. The lowest concentration of SARS-CoV-2 viral copies this assay can detect is 138 copies/mL. Heberto Sturdevant negative result does not preclude SARS-Cov-2 infection and should not be used as the sole basis for treatment or other patient management decisions. Masha Orbach negative result may occur with  improper specimen collection/handling, submission of specimen other than nasopharyngeal swab, presence of viral mutation(s) within the areas targeted by this assay, and inadequate number of viral copies(<138 copies/mL). Nacole Fluhr negative result must be combined with clinical observations, patient history, and epidemiological information. The expected result is Negative.  Fact Sheet for Patients:  EntrepreneurPulse.com.au  Fact Sheet for Healthcare Providers:  IncredibleEmployment.be  This test is no t yet approved or cleared by the [place] FDA and  has been authorized for detection and/or diagnosis of SARS-CoV-2 by FDA under an Emergency Use Authorization (EUA). This EUA will remain  in effect (meaning this test can be used) for the duration of the COVID-19 declaration under Section 564(b)(1) of the Act, 21 U.S.C.section 360bbb-3(b)(1), unless the authorization is terminated  or revoked sooner.       Influenza Kidada Ging by PCR NEGATIVE NEGATIVE Final   Influenza B by PCR NEGATIVE NEGATIVE Final    Comment: (NOTE) The Xpert Xpress SARS-CoV-2/FLU/RSV plus assay is intended as an aid in the diagnosis of influenza from Nasopharyngeal swab specimens and should not be used as Nella [place] sole basis for treatment. Nasal washings and aspirates are unacceptable for Xpert Xpress SARS-CoV-2/FLU/RSV testing.  Fact Sheet for Patients: EntrepreneurPulse.com.au  Fact Sheet for  Healthcare Providers: IncredibleEmployment.be  This test is not yet approved or cleared by the [place] and has been authorized for detection and/or diagnosis of SARS-CoV-2 by FDA under an Emergency Use Authorization (EUA). This EUA will remain in effect (meaning this test can be used) for the duration of the COVID-19 declaration under Section 564(b)(1) of the Act, 21 U.S.C. section 360bbb-3(b)(1), unless the authorization is terminated or revoked.  Performed at [facility], [address] [place], [place] [address]   MRSA Next Gen by PCR, Nasal     Status: None   Collection Time: 09/04/21 12:39 PM   Specimen: Nasal Mucosa; Nasal Swab  Result Value Ref Range Status   MRSA by PCR Next Gen NOT DETECTED NOT DETECTED Final    Comment: (NOTE) The GeneXpert MRSA Assay (FDA approved for NASAL specimens only), is one component of Dameian Crisman comprehensive MRSA colonization surveillance program. It is not intended to diagnose MRSA infection nor to guide or monitor treatment for MRSA infections. Test performance is not FDA approved in patients less than 85 years old. Performed at [facility], [address], [place], [place] [address]          Radiology Studies: DG Chest 1 View  Result Date: 09/07/2021 CLINICAL DATA:  Kidney damage, history diabetes mellitus, hypertension, CHF, paroxysmal atrial fibrillation EXAM: CHEST  1 VIEW COMPARISON:  Portable exam 1356 hours compared to 03/12/2021 FINDINGS: Enlargement of cardiac silhouette. Mediastinal contours and pulmonary vascularity normal. Atherosclerotic calcification at aortic arch. Bronchitic changes with LEFT lower lobe consolidation consistent with pneumonia. Mild atelectasis versus infiltrate at medial RIGHT lung base as well. Tiny LEFT pleural effusion. No pneumothorax or acute osseous findings. IMPRESSION: LEFT lower lobe and question mild medial RIGHT lower lobe infiltrates consistent  pneumonia. Tiny LEFT pleural effusion, decreased. Enlargement of cardiac silhouette. Aortic Atherosclerosis (ICD10-I70.0). Electronically Signed   By: Lavonia Dana M.D.   On: 09/07/2021 15:31   PERIPHERAL VASCULAR CATHETERIZATION  Result Date: 09/07/2021 See surgical note for result.       Scheduled Meds:  [MAR Hold] atorvastatin  40 mg Oral QHS   [MAR Hold] carvedilol  25 mg Oral BID WC   fentaNYL       [MAR Hold] ferrous sulfate  325 mg Oral Q breakfast   [MAR Hold] heparin injection (subcutaneous)  5,000 Units Subcutaneous Q8H   [MAR Hold] hydrALAZINE  50 mg Oral TID   [MAR Hold] insulin aspart  0-15 Units Subcutaneous Q4H   [MAR Hold] isosorbide mononitrate  30 mg Oral BID   midazolam       [MAR Hold] multivitamin with minerals  1 tablet Oral Daily   Continuous Infusions:  [MAR Hold] sodium chloride 10 mL/hr at 09/07/21 0350   sodium chloride 75 mL/hr at 09/07/21 0957   sodium chloride 75 mL/hr at 09/07/21 1654   [MAR Hold]  ceFAZolin (ANCEF) IV       LOS: 4 days    Time spent: over 30 min    Fayrene Helper, MD [facility]   To contact the attending provider between 7A-7P or the covering provider during after hours 7P-7A, please log into the web site [email or web address] and access using universal [place] password for that web site. If you do not have the password, please call the hospital operator.  09/07/2021, 6:18 PM

## 2021-09-07 NOTE — Op Note (Signed)
[facility]  Percutaneous Study/Intervention Procedural Note   Date of Surgery: 09/07/2021  Surgeon(s):Keyston Ardolino    Assistants:none  Pre-operative Diagnosis: PAD with infected ulceration left lower extremity  Post-operative diagnosis:  Same with calcific PAD but no focal stenosis creating flow limitation  Procedure(s) Performed:             1.  Ultrasound guidance for vascular access right femoral artery             2.  Catheter placement into left SFA from right femoral approach             3.  Aortogram and selective left lower extremity angiogram             4.  StarClose closure device right femoral artery  EBL: 5 cc  Contrast: 30 cc  Fluoro Time: 1 minute  Moderate Conscious Sedation Time: approximately 13 minutes using 2 mg of Versed and 50 mcg of Fentanyl              Indications:  Patient is a 60 y.o.male with an infected ulceration of the left lower extremity. The patient is brought in for angiography for further evaluation and potential treatment.  Due to the limb threatening nature of the situation, angiogram was performed for attempted limb salvage. The patient is aware that if the procedure fails, amputation would be expected.  The patient also understands that even with successful revascularization, amputation may still be required due to the severity of the situation.  Risks and benefits are discussed and informed consent is obtained.   Procedure:  The patient was identified and appropriate procedural time out was performed.  The patient was then placed supine on the table and prepped and draped in the usual sterile fashion. Moderate conscious sedation was administered during a face to face encounter with the patient throughout the procedure with my supervision of the RN administering medicines and monitoring the patient's vital signs, pulse oximetry, telemetry and mental status throughout from the start of the procedure until the patient was  taken to the recovery room. Ultrasound was used to evaluate the right common femoral artery.  It was patent .  A digital ultrasound image was acquired.  A Seldinger needle was used to access the right common femoral artery under direct ultrasound guidance and a permanent image was performed.  A 0.035 J wire was advanced without resistance and a 5Fr sheath was placed.  Pigtail catheter was placed into the aorta and an AP aortogram was performed. This demonstrated normal renal arteries and normal aorta and iliac segments without significant stenosis. I then crossed the aortic bifurcation and advanced to the left femoral head.  After the initial shot, we advanced the pigtail catheter into the mid SFA to opacify distally.  Selective left lower extremity angiogram was then performed. This demonstrated that the left common femoral artery, profunda femoris artery, superficial femoral artery, and popliteal arteries were all patent without focal stenosis although heavily calcific.  The tibial vessels were also heavily calcific.  The posterior tibial artery was large and was the dominant runoff distally with continuous flow into the foot without focal stenosis.  The peroneal and anterior tibial arteries were small but did not have any obvious focal stenosis and provided additional runoff distally.  His perfusion was adequate for wound healing and no endovascular intervention was required.  I elected to terminate the procedure. The sheath was removed and StarClose closure device was deployed in the right femoral  artery with excellent hemostatic result. The patient was taken to the recovery room in stable condition having tolerated the procedure well.  Findings:               Aortogram:  This demonstrated normal renal arteries and normal aorta and iliac segments without significant stenosis.             Left Lower Extremity: The left common femoral artery, profunda femoris artery, superficial femoral artery, and popliteal  arteries were all patent without focal stenosis although heavily calcific.  The tibial vessels were also heavily calcific.  The posterior tibial artery was large and was the dominant runoff distally with continuous flow into the foot without focal stenosis.  The peroneal and anterior tibial arteries were small but did not have any obvious focal stenosis and provided additional runoff distally.   Disposition: Patient was taken to the recovery room in stable condition having tolerated the procedure well.  Complications: None  Leotis Pain 09/07/2021 5:21 PM   This note was created with Dragon Medical transcription system. Any errors in dictation are purely unintentional.

## 2021-09-07 NOTE — TOC Progression Note (Signed)
Transition of Care [facility]) - Progression Note    Patient Details  Name: Nathan [name] MRN: [id number] Date of Birth: 02/08/1961  Transition of Care [facility]) CM/SW [place], RN Phone Number: 09/07/2021, 9:06 AM  Clinical Narrative:   The patient is able to get medication from [facility] as he does not have insurance, [facility] was added to system, TOC will continue to Monitor for needs    Expected Discharge Plan: Home/Self Care Barriers to Discharge: Continued Medical Work up  Expected Discharge Plan and Services Expected Discharge Plan: Home/Self Care   Discharge Planning Services: CM Consult   Living arrangements for the past 2 months: Single Family Home                                       Social Determinants of Health (SDOH) Interventions    Readmission Risk Interventions No flowsheet data found.

## 2021-09-07 NOTE — Assessment & Plan Note (Addendum)
Concern for pneumonia on todays x ray, clinically looks ok Will follow, repeat  12/21

## 2021-09-07 NOTE — Progress Notes (Addendum)
[facility]  ROUNDING NOTE   Subjective:   Nathan [name] is a 60 year old male with past medical conditions including diabetes, paroxysmal A. fib, diabetes, systolic and diastolic chronic heart failure, hypertension, and chronic kidney disease stage IV.  Patient reports to the emergency department with complaints of bleeding from his left calf wound.  Patient has been admitted for Hyperglycemia [R73.9] Cellulitis of left lower extremity [L03.116] AKI (acute kidney injury) ([place]) [N17.9] Left leg cellulitis [K53.976]  Patient is known to our office and is followed Dr. Lanora Manis.  He was last seen in the office March 25, 2021 for routine follow-up.    Update: Patient resting quietly Denies shortness of breath Denies GI upset Remains agreeable to PD placement   Objective:  Vital signs in last 24 hours:  Temp:  [97.7 F (36.5 C)-98.2 F (36.8 C)] 98.1 F (36.7 C) (12/19 0812) Pulse Rate:  [61-81] 64 (12/19 0812) Resp:  [17-19] 17 (12/18 1919) BP: (115-149)/(58-88) 147/73 (12/19 0812) SpO2:  [96 %-100 %] 100 % (12/19 0812)  Weight change:  Filed Weights   09/03/21 1824  Weight: 65.8 kg    Intake/Output: I/O last 3 completed shifts: In: 845.4 [P.O.:720; I.V.:25.4; IV Piggyback:100] Out: 650 [Urine:650]   Intake/Output this shift:  Total I/O In: -  Out: 200 [Urine:200]  Physical Exam: General: NAD, resting in bed  Head: Normocephalic, atraumatic. Moist oral mucosal membranes  Eyes: Anicteric  Lungs:  Clear to auscultation, normal effort, room air  Heart: Regular rate and rhythm  Abdomen:  Soft, nontender, nondistended  Extremities: trace peripheral edema.  Neurologic: Nonfocal, moving all four extremities  Skin: LLE wound       Basic Metabolic Panel: Recent Labs  Lab 09/03/21 1858 09/04/21 0424 09/05/21 0323 09/05/21 0813 09/06/21 0335 09/07/21 0322  NA 141 142  --  141 138 140  K 3.9 3.9  --  4.2 4.3 4.4  CL 111 114*  --  112* 111 113*   CO2 21* 21*  --  19* 18* 19*  GLUCOSE 229* 104*  --  103* 90 98  BUN 101* 106*  --  107* 101* 118*  CREATININE 5.45* 5.17* 5.45* 5.41* 5.28* 5.49*  CALCIUM 8.0* 7.7*  --  7.5* 8.1* 7.9*  MG  --   --   --   --  2.6* 2.6*  PHOS  --   --   --   --  6.2* 6.9*     Liver Function Tests: Recent Labs  Lab 09/05/21 0813 09/06/21 0335 09/07/21 0322  AST 19 18 17   ALT 18 17 15   ALKPHOS 197* 191* 194*  BILITOT 0.6 0.7 0.7  PROT 4.7* 5.0* 4.6*  ALBUMIN 2.0* 2.2* 2.1*    No results for input(s): LIPASE, AMYLASE in the last 168 hours. No results for input(s): AMMONIA in the last 168 hours.  CBC: Recent Labs  Lab 09/03/21 1858 09/04/21 0424 09/05/21 0813 09/05/21 1603 09/06/21 0335 09/07/21 0322  WBC 8.4 8.5 6.8  --  6.6 6.3  NEUTROABS 6.6  --  5.4  --  5.2 5.1  HGB 7.4* 6.9* 7.3* 7.5* 7.4* 7.1*  HCT 22.7* 21.5* 22.4* 22.7* 22.8* 21.7*  MCV 99.6 97.3 92.6  --  91.2 91.2  PLT 535* 448* 333  --  338 323     Cardiac Enzymes: No results for input(s): CKTOTAL, CKMB, CKMBINDEX, TROPONINI in the last 168 hours.  BNP: Invalid input(s): POCBNP  CBG: Recent Labs  Lab 09/06/21 1509 09/06/21 2000 09/07/21  0112 09/07/21 0357 09/07/21 [place]     Microbiology: Results for orders placed or performed during the hospital encounter of 09/03/21  Resp Panel by RT-PCR (Flu A&B, Covid) Nasopharyngeal Swab     Status: None   Collection Time: 09/03/21  8:30 PM   Specimen: Nasopharyngeal Swab; Nasopharyngeal(NP) swabs in vial transport medium  Result Value Ref Range Status   SARS Coronavirus 2 by RT PCR NEGATIVE NEGATIVE Final    Comment: (NOTE) SARS-CoV-2 target nucleic acids are NOT DETECTED.  The SARS-CoV-2 RNA is generally detectable in upper respiratory specimens during the acute phase of infection. The lowest concentration of SARS-CoV-2 viral copies this assay can detect is 138 copies/mL. A negative result does not preclude SARS-Cov-2 infection  and should not be used as the sole basis for treatment or other patient management decisions. A negative result may occur with  improper specimen collection/handling, submission of specimen other than nasopharyngeal swab, presence of viral mutation(s) within the areas targeted by this assay, and inadequate number of viral copies(<138 copies/mL). A negative result must be combined with clinical observations, patient history, and epidemiological information. The expected result is Negative.  Fact Sheet for Patients:  EntrepreneurPulse.com.au  Fact Sheet for Healthcare Providers:  IncredibleEmployment.be  This test is no t yet approved or cleared by the [place] FDA and  has been authorized for detection and/or diagnosis of SARS-CoV-2 by FDA under an Emergency Use Authorization (EUA). This EUA will remain  in effect (meaning this test can be used) for the duration of the COVID-19 declaration under Section 564(b)(1) of the Act, 21 U.S.C.section 360bbb-3(b)(1), unless the authorization is terminated  or revoked sooner.       Influenza A by PCR NEGATIVE NEGATIVE Final   Influenza B by PCR NEGATIVE NEGATIVE Final    Comment: (NOTE) The Xpert Xpress SARS-CoV-2/FLU/RSV plus assay is intended as an aid in the diagnosis of influenza from Nasopharyngeal swab specimens and should not be used as a sole basis for treatment. Nasal washings and aspirates are unacceptable for Xpert Xpress SARS-CoV-2/FLU/RSV testing.  Fact Sheet for Patients: EntrepreneurPulse.com.au  Fact Sheet for Healthcare Providers: IncredibleEmployment.be  This test is not yet approved or cleared by the [place] FDA and has been authorized for detection and/or diagnosis of SARS-CoV-2 by FDA under an Emergency Use Authorization (EUA). This EUA will remain in effect (meaning this test can be used) for the duration of the COVID-19 declaration  under Section 564(b)(1) of the Act, 21 U.S.C. section 360bbb-3(b)(1), unless the authorization is terminated or revoked.  Performed at [facility], [address] [place], [place] [address]   MRSA Next Gen by PCR, Nasal     Status: None   Collection Time: 09/04/21 12:39 PM   Specimen: Nasal Mucosa; Nasal Swab  Result Value Ref Range Status   MRSA by PCR Next Gen NOT DETECTED NOT DETECTED Final    Comment: (NOTE) The GeneXpert MRSA Assay (FDA approved for NASAL specimens only), is one component of a comprehensive MRSA colonization surveillance program. It is not intended to diagnose MRSA infection nor to guide or monitor treatment for MRSA infections. Test performance is not FDA approved in patients less than 1 years old. Performed at [facility], [address] [place], [place] [address]     Coagulation Studies: No results for input(s): LABPROT, INR in the last 72 hours.  Urinalysis: No results for input(s): COLORURINE, LABSPEC, PHURINE, GLUCOSEU, HGBUR, BILIRUBINUR, KETONESUR, PROTEINUR, UROBILINOGEN, NITRITE,  LEUKOCYTESUR in the last 72 hours.  Invalid input(s): APPERANCEUR     Imaging: No results found.   Medications:    sodium chloride 10 mL/hr at 09/07/21 0350   sodium chloride     cefTRIAXone (ROCEPHIN)  IV 1 g (09/07/21 0353)    atorvastatin  40 mg Oral QHS   carvedilol  25 mg Oral BID WC   ferrous sulfate  325 mg Oral Q breakfast   heparin injection (subcutaneous)  5,000 Units Subcutaneous Q8H   hydrALAZINE  50 mg Oral TID   insulin aspart  0-15 Units Subcutaneous Q4H   isosorbide mononitrate  30 mg Oral BID   multivitamin with minerals  1 tablet Oral Daily   vancomycin variable dose per unstable renal function (pharmacist dosing)   Does not apply See admin instructions   sodium chloride, acetaminophen **OR** acetaminophen, morphine injection, ondansetron **OR** ondansetron (ZOFRAN) IV, traZODone  Assessment/ Plan:  [name] is a 60 y.o.  male with past medical conditions including diabetes, paroxysmal A. fib, diabetes, systolic and diastolic chronic heart failure, hypertension, and chronic kidney disease stage IV.  Patient reports to the emergency department with complaints of bleeding from his left calf wound.  Patient has been admitted for Hyperglycemia [R73.9] Cellulitis of left lower extremity [L03.116] AKI (acute kidney injury) ([place]) [N17.9] Left leg cellulitis [L03.116]   End stage renal disease requiring dialysis.  Continue to hold diuretics Patient has been on aggressive diuresis outpatient, furosemide 80 mg twice daily and metolazone 2.5 mg twice daily.    -Renal function declines to 11%  -Agreeable to proceed with PD placement. Consult placed to surgery for PD placement. Pending PD placement tomorrow  - Once placed, will request urgent start outpatient. Will order Chest Xray for placement purposes and rule out TB  - PD educator notified     Lab Results  Component Value Date   CREATININE 5.49 (H) 09/07/2021   CREATININE 5.28 (H) 09/06/2021   CREATININE 5.41 (H) 09/05/2021    Intake/Output Summary (Last 24 hours) at 09/07/2021 0954 Last data filed at 09/07/2021 0900 Gross per 24 hour  Intake 480 ml  Output 650 ml  Net -170 ml    2. Anemia of chronic kidney disease Lab Results  Component Value Date   HGB 7.1 (L) 09/07/2021    Will order EPO 20000 units SubQ weekly.   3. Secondary Hyperparathyroidism: with outpatient labs: phosphorus 5.5, calcium 7.8 on 03/25/21.   Lab Results  Component Value Date   CALCIUM 7.9 (L) 09/07/2021   CAION 1.05 (L) 06/26/2019   PHOS 6.9 (H) 09/07/2021    Will continue to monitor and consider binders at later date.  4.  Chronic systolic heart failure.  Echo from 03/14/2021 shows EF 40 to 45% with a grade 2 diastolic dysfunction and mild LVH.  Placed on furosemide and metolazone (outpatient) as above for fluid management.    -remain held  5.  LLE cellulitis, Wound culture obtained Vascular consulted for angiogram, concerned with increased renal function.  Receiving Ceftriaxone and Vanc Angiogram scheduled for today   LOS: 4 Brondon Wann 12/19/20229:54 AM

## 2021-09-07 NOTE — Progress Notes (Signed)
PHARMACY NOTE:  ANTIMICROBIAL RENAL DOSAGE ADJUSTMENT  Current antimicrobial regimen includes a mismatch between antimicrobial dosage and estimated renal function.  As per policy approved by the Pharmacy & Therapeutics and Medical Executive Committees, the antimicrobial dosage will be adjusted accordingly.  Current antimicrobial dosage:  Cefazolin 1gm IV every 8 hours  Indication: Cellulitis  Renal Function:  Estimated Creatinine Clearance: 13.7 mL/min (A) (by C-G formula based on SCr of 5.49 mg/dL (H)). []      On intermittent HD, scheduled: []      On CRRT    Antimicrobial dosage has been changed to:  Cefazolin 1gm every 12 hours  Additional comments:   Thank you for allowing pharmacy to be a part of this patient's care.  Pernell Dupre, [facility] 09/07/2021 11:09 AM

## 2021-09-07 NOTE — Consult Note (Signed)
Patient ID: Nathan [name], male   DOB: 09-08-1961, 60 y.o.   MRN: [id number]  HPI Nathan [name] is a 60 y.o. male seen in consultation at the request of dR. Lateef for laparoscopic PD catheter placement.  He does have significant comorbidities including diabetes, heart failure, hypertension and end-stage renal disease.  He was at some point in time on Eliquis but stopped due to persistent bleeding.  He was recently admitted for hyperglycemia which have subsided.  He also has been getting antibiotics due to some cellulitis that has pretty much resolved. Hemoglobin of 7.7, CMP shows a creatinine of 5.49 with some acidosis.  Calcium of 4.4, BUN of 2.1 white count and platelets within normal limits He did have chest x-ray today without any major acute abnormalities.  Cardiomegaly No prior major abdominal operations   HPI  Past Medical History:  Diagnosis Date   Anemia of chronic disease    Chronic combined systolic (congestive) and diastolic (congestive) heart failure ([place])    a. 05/2019 Echo: EF 45-50%, nl LA/RA size. Mild to mod TR.   CKD (chronic kidney disease), stage III ([place])    Diabetes mellitus without complication ([place])    Hypertension    Left Hydropneumothorax/Fibrinopurulent empyema    a. 06/2019 s/p VATS.   PAF (paroxysmal atrial fibrillation) ([place])    a. 05/2019 in setting of sepsis-->s/p TEE/DCCV; b. CHA2DS2VASc = 2-->Eliquis.   Sepsis ([place])    a. 05/2019 MRSA in setting of infected R thumb.    Past Surgical History:  Procedure Laterality Date   HAND SURGERY Right    I & D EXTREMITY Right 06/11/2019   Procedure: IRRIGATION AND DEBRIDEMENT RIGHT THUMB;  Surgeon: Dereck Leep, MD;  Location: [facility] ORS;  Service: Orthopedics;  Laterality: Right;   TEE WITHOUT CARDIOVERSION N/A 06/14/2019   Procedure: TRANSESOPHAGEAL ECHOCARDIOGRAM (TEE);  Surgeon: Minna Merritts, MD;  Location: [facility] ORS;  Service: Cardiovascular;  Laterality: N/A;   VIDEO ASSISTED THORACOSCOPY  (VATS)/THOROCOTOMY Left 06/26/2019   Procedure: VIDEO ASSISTED THORACOSCOPY DECORTICATION;  Surgeon: Lajuana Matte, MD;  Location: [facility] OR;  Service: Thoracic;  Laterality: Left;   VIDEO BRONCHOSCOPY N/A 06/26/2019   Procedure: VIDEO BRONCHOSCOPY;  Surgeon: Lajuana Matte, MD;  Location: [facility] OR;  Service: Thoracic;  Laterality: N/A;    Family History  Problem Relation Age of Onset   CAD Father    Diabetes Sister    Diabetes Brother    Diabetes Sister    Diabetes Sister     Social History Social History   Tobacco Use   Smoking status: Never   Smokeless tobacco: Never  Vaping Use   Vaping Use: Never used  Substance Use Topics   Alcohol use: Never   Drug use: Never    No Known Allergies  Current Facility-Administered Medications  Medication Dose Route Frequency Provider Last Rate Last Admin   0.9 %  sodium chloride infusion   Intravenous PRN Elodia Florence., MD 10 mL/hr at 09/07/21 0350 Restarted at 09/07/21 0350   0.9 %  sodium chloride infusion   Intravenous Continuous Stegmayer, Kimberly A, PA-C 75 mL/hr at 09/07/21 0957 New Bag at 09/07/21 0957   acetaminophen (TYLENOL) tablet 650 mg  650 mg Oral Q6H PRN Mansy, Jan A, MD   650 mg at 09/04/21 2332   Or   acetaminophen (TYLENOL) suppository 650 mg  650 mg Rectal Q6H PRN Mansy, Jan A, MD       atorvastatin (LIPITOR) tablet 40 mg  40 mg Oral QHS Mansy, Jan A, MD   40 mg at 09/06/21 2013   carvedilol (COREG) tablet 25 mg  25 mg Oral BID WC Mansy, Jan A, MD   25 mg at 09/07/21 0839   [START ON 09/08/2021] ceFAZolin (ANCEF) IVPB 1 g/50 mL premix  1 g Intravenous Q12H Elodia Florence., MD       fentaNYL (SUBLIMAZE) 50 MCG/ML injection            ferrous sulfate tablet 325 mg  325 mg Oral Q breakfast Mansy, Jan A, MD   325 mg at 09/07/21 0839   heparin injection 5,000 Units  5,000 Units Subcutaneous Q8H Mansy, Jan A, MD   5,000 Units at 09/07/21 1339   heparin sodium (porcine) 1000 UNIT/ML injection             hydrALAZINE (APRESOLINE) tablet 50 mg  50 mg Oral TID Mansy, Jan A, MD   50 mg at 09/07/21 0947   insulin aspart (novoLOG) injection 0-15 Units  0-15 Units Subcutaneous Q4H Lucrezia Starch, MD   2 Units at 09/07/21 0140   isosorbide mononitrate (IMDUR) 24 hr tablet 30 mg  30 mg Oral BID Mansy, Jan A, MD   30 mg at 09/07/21 0946   midazolam (VERSED) 2 MG/2ML injection            morphine 2 MG/ML injection 2 mg  2 mg Intravenous Q4H PRN Mansy, Jan A, MD       multivitamin with minerals tablet 1 tablet  1 tablet Oral Daily Mansy, Jan A, MD   1 tablet at 09/07/21 0946   ondansetron (ZOFRAN) tablet 4 mg  4 mg Oral Q6H PRN Mansy, Jan A, MD       Or   ondansetron [facility]) injection 4 mg  4 mg Intravenous Q6H PRN Mansy, Jan A, MD       traZODone (DESYREL) tablet 25 mg  25 mg Oral QHS PRN Mansy, Arvella Merles, MD         Review of Systems Full ROS  was asked and was negative except for the information on the HPI  Physical Exam Blood pressure (!) 147/74, pulse 64, temperature 97.7 F (36.5 C), resp. rate 17, height 5\' 9"  (1.753 m), weight 65.8 kg, SpO2 98 %. CONSTITUTIONAL: NAD. EYES: Pupils are equal, round,  Sclera are non-icteric. EARS, NOSE, MOUTH AND THROAT: He is wearing a mask. Hearing is intact to voice. LYMPH NODES:  Lymph nodes in the neck are normal. RESPIRATORY:  Lungs are clear. There is normal respiratory effort, with equal breath sounds bilaterally, and without pathologic use of accessory muscles. CARDIOVASCULAR: Heart is regular without murmurs, gallops, or rubs. GI: The abdomen is  soft, nontender, and nondistended. There are no palpable masses. There is no hepatosplenomegaly. There are normal bowel sounds in all quadrants. GU: Rectal deferred.   MUSCULOSKELETAL: Normal muscle strength and tone. No cyanosis or edema.   SKIN: Turgor is good and there are no pathologic skin lesions or ulcers. Leg w resolved cellulitis, chronic vascular ulceration NEUROLOGIC: Motor and sensation is grossly  normal. Cranial nerves are grossly intact. PSYCH:  Oriented to person, place and time. Affect is normal.  Data Reviewed  I have personally reviewed the patient's imaging, laboratory findings and medical records.    Assessment/Plan 60 year old male with history of end-stage renal disease in need for dialysis.  He is very interested in PD catheter placement.  Discussed with the patient in detail about the  procedure and different modalities of dialysis.  He seems to be very interested in ambulatory dialysis. We will plan for laparoscopic PD catheter placement tomorrow.  Procedure discussed with the patient in detail.  Risks, benefits and possible complications including but not limited to, bleeding, infection injury to intra-abdominal organs, catheter malfunction.  He understands and wished to proceed.  Caroleen Hamman, MD FACS General Surgeon 09/07/2021, 4:21 PM

## 2021-09-08 ENCOUNTER — Inpatient Hospital Stay: Payer: Medicaid Other | Admitting: Certified Registered Nurse Anesthetist

## 2021-09-08 ENCOUNTER — Encounter
Admission: EM | Disposition: A | Discharge status: Home / Self Care | Payer: Self-pay | Source: Home / Self Care | Attending: Family Medicine

## 2021-09-08 ENCOUNTER — Encounter: Payer: Self-pay | Admitting: Vascular Surgery

## 2021-09-08 DIAGNOSIS — Z992 Dependence on renal dialysis: Secondary | ICD-10-CM

## 2021-09-08 HISTORY — PX: CAPD INSERTION: SHX5233

## 2021-09-08 LAB — CBC WITH DIFFERENTIAL/PLATELET
Abs Immature Granulocytes: 0.13 10*3/uL — ABNORMAL HIGH (ref 0.00–0.07)
Basophils Absolute: 0.1 10*3/uL (ref 0.0–0.1)
Basophils Relative: 1 %
Eosinophils Absolute: 0.2 10*3/uL (ref 0.0–0.5)
Eosinophils Relative: 4 %
HCT: 23.5 % — ABNORMAL LOW (ref 39.0–52.0)
Hemoglobin: 7.5 g/dL — ABNORMAL LOW (ref 13.0–17.0)
Immature Granulocytes: 2 %
Lymphocytes Relative: 6 %
Lymphs Abs: 0.4 10*3/uL — ABNORMAL LOW (ref 0.7–4.0)
MCH: 29.6 pg (ref 26.0–34.0)
MCHC: 31.9 g/dL (ref 30.0–36.0)
MCV: 92.9 fL (ref 80.0–100.0)
Monocytes Absolute: 0.9 10*3/uL (ref 0.1–1.0)
Monocytes Relative: 13 %
Neutro Abs: 5.2 10*3/uL (ref 1.7–7.7)
Neutrophils Relative %: 74 %
Platelets: 343 10*3/uL (ref 150–400)
RBC: 2.53 MIL/uL — ABNORMAL LOW (ref 4.22–5.81)
RDW: 17.1 % — ABNORMAL HIGH (ref 11.5–15.5)
WBC: 7 10*3/uL (ref 4.0–10.5)
nRBC: 0 % (ref 0.0–0.2)

## 2021-09-08 LAB — GLUCOSE, CAPILLARY
Glucose-Capillary: 129 mg/dL — ABNORMAL HIGH (ref 70–99)
Glucose-Capillary: 144 mg/dL — ABNORMAL HIGH (ref 70–99)
Glucose-Capillary: 145 mg/dL — ABNORMAL HIGH (ref 70–99)
Glucose-Capillary: 147 mg/dL — ABNORMAL HIGH (ref 70–99)
Glucose-Capillary: 148 mg/dL — ABNORMAL HIGH (ref 70–99)
Glucose-Capillary: 177 mg/dL — ABNORMAL HIGH (ref 70–99)
Glucose-Capillary: 192 mg/dL — ABNORMAL HIGH (ref 70–99)

## 2021-09-08 LAB — COMPREHENSIVE METABOLIC PANEL
ALT: 15 U/L (ref 0–44)
AST: 15 U/L (ref 15–41)
Albumin: 2.2 g/dL — ABNORMAL LOW (ref 3.5–5.0)
Alkaline Phosphatase: 196 U/L — ABNORMAL HIGH (ref 38–126)
Anion gap: 9 (ref 5–15)
BUN: 117 mg/dL — ABNORMAL HIGH (ref 6–20)
CO2: 18 mmol/L — ABNORMAL LOW (ref 22–32)
Calcium: 7.7 mg/dL — ABNORMAL LOW (ref 8.9–10.3)
Chloride: 114 mmol/L — ABNORMAL HIGH (ref 98–111)
Creatinine, Ser: 5.09 mg/dL — ABNORMAL HIGH (ref 0.61–1.24)
GFR, Estimated: 12 mL/min — ABNORMAL LOW (ref >60)
Glucose, Bld: 157 mg/dL — ABNORMAL HIGH (ref 70–99)
Potassium: 4.3 mmol/L (ref 3.5–5.1)
Sodium: 141 mmol/L (ref 135–145)
Total Bilirubin: 0.7 mg/dL (ref 0.3–1.2)
Total Protein: 4.8 g/dL — ABNORMAL LOW (ref 6.5–8.1)

## 2021-09-08 LAB — HEMOGLOBIN AND HEMATOCRIT, BLOOD
HCT: 26.1 % — ABNORMAL LOW (ref 39.0–52.0)
Hemoglobin: 8.2 g/dL — ABNORMAL LOW (ref 13.0–17.0)

## 2021-09-08 LAB — MAGNESIUM: Magnesium: 2.6 mg/dL — ABNORMAL HIGH (ref 1.7–2.4)

## 2021-09-08 LAB — PHOSPHORUS: Phosphorus: 6.8 mg/dL — ABNORMAL HIGH (ref 2.5–4.6)

## 2021-09-08 LAB — HEPATITIS B SURFACE ANTIBODY, QUANTITATIVE: Hep B S AB Quant (Post): <3.1 m[IU]/mL — ABNORMAL LOW (ref >9.9)

## 2021-09-08 SURGERY — LAPAROSCOPIC INSERTION CONTINUOUS AMBULATORY PERITONEAL DIALYSIS  (CAPD) CATHETER
Anesthesia: General

## 2021-09-08 MED ORDER — LACTATED RINGERS IV SOLN: INTRAVENOUS | Status: DC

## 2021-09-08 MED ORDER — DEXAMETHASONE SODIUM PHOSPHATE 10 MG/ML IJ SOLN
INTRAMUSCULAR | Status: DC | PRN
  Administered 2021-09-08: 4 mg via INTRAVENOUS

## 2021-09-08 MED ORDER — PHENOL 1.4 % MT LIQD
1.0000 | OROMUCOSAL | Status: DC | PRN
  Administered 2021-09-08: 14:00:00 1 via OROMUCOSAL
  Filled 2021-09-08: qty 177

## 2021-09-08 MED ORDER — ROCURONIUM BROMIDE 100 MG/10ML IV SOLN
INTRAVENOUS | Status: DC | PRN
  Administered 2021-09-08: 50 mg via INTRAVENOUS

## 2021-09-08 MED ORDER — HEPARIN SODIUM (PORCINE) 10000 UNIT/ML IJ SOLN
INTRAMUSCULAR | Status: AC
  Filled 2021-09-08: qty 1

## 2021-09-08 MED ORDER — 0.9 % SODIUM CHLORIDE (POUR BTL) OPTIME
TOPICAL | Status: DC | PRN
  Administered 2021-09-08: 12:00:00 500 mL

## 2021-09-08 MED ORDER — PROPOFOL 10 MG/ML IV BOLUS
INTRAVENOUS | Status: DC | PRN
  Administered 2021-09-08: 120 mg via INTRAVENOUS

## 2021-09-08 MED ORDER — FENTANYL CITRATE (PF) 100 MCG/2ML IJ SOLN
INTRAMUSCULAR | Status: AC
  Filled 2021-09-08: qty 2

## 2021-09-08 MED ORDER — OXYCODONE HCL 5 MG PO TABS: 5.0000 mg | ORAL_TABLET | ORAL | Status: DC | PRN

## 2021-09-08 MED ORDER — BUPIVACAINE LIPOSOME 1.3 % IJ SUSP
INTRAMUSCULAR | Status: AC
  Filled 2021-09-08: qty 20

## 2021-09-08 MED ORDER — FENTANYL CITRATE (PF) 100 MCG/2ML IJ SOLN
INTRAMUSCULAR | Status: DC | PRN
  Administered 2021-09-08: 50 ug via INTRAVENOUS

## 2021-09-08 MED ORDER — VASOPRESSIN 20 UNIT/ML IV SOLN
INTRAVENOUS | Status: DC | PRN
  Administered 2021-09-08: 2 [IU] via INTRAVENOUS
  Administered 2021-09-08: 1 [IU] via INTRAVENOUS

## 2021-09-08 MED ORDER — EPHEDRINE SULFATE 50 MG/ML IJ SOLN
INTRAMUSCULAR | Status: DC | PRN
  Administered 2021-09-08 (×5): 5 mg via INTRAVENOUS

## 2021-09-08 MED ORDER — PROPOFOL 10 MG/ML IV BOLUS
INTRAVENOUS | Status: AC
  Filled 2021-09-08: qty 20

## 2021-09-08 MED ORDER — ACETAMINOPHEN 10 MG/ML IV SOLN
INTRAVENOUS | Status: DC | PRN
  Administered 2021-09-08: 1000 mg via INTRAVENOUS

## 2021-09-08 MED ORDER — LIDOCAINE HCL (CARDIAC) PF 100 MG/5ML IV SOSY
PREFILLED_SYRINGE | INTRAVENOUS | Status: DC | PRN
  Administered 2021-09-08: 60 mg via INTRAVENOUS

## 2021-09-08 MED ORDER — LIDOCAINE HCL (PF) 2 % IJ SOLN
INTRAMUSCULAR | Status: AC
  Filled 2021-09-08: qty 5

## 2021-09-08 MED ORDER — OXYCODONE HCL 5 MG/5ML PO SOLN: 5.0000 mg | Freq: Once | ORAL | Status: DC | PRN

## 2021-09-08 MED ORDER — BUPIVACAINE-EPINEPHRINE (PF) 0.25% -1:200000 IJ SOLN
INTRAMUSCULAR | Status: AC
  Filled 2021-09-08: qty 30

## 2021-09-08 MED ORDER — ONDANSETRON HCL 4 MG/2ML IJ SOLN
INTRAMUSCULAR | Status: DC | PRN
  Administered 2021-09-08: 4 mg via INTRAVENOUS

## 2021-09-08 MED ORDER — ACETAMINOPHEN 10 MG/ML IV SOLN: 1000.0000 mg | Freq: Once | INTRAVENOUS | Status: DC | PRN

## 2021-09-08 MED ORDER — BUPIVACAINE LIPOSOME 1.3 % IJ SUSP
INTRAMUSCULAR | Status: DC | PRN
  Administered 2021-09-08: 20 mL

## 2021-09-08 MED ORDER — MIDAZOLAM HCL 2 MG/2ML IJ SOLN
INTRAMUSCULAR | Status: AC
  Filled 2021-09-08: qty 2

## 2021-09-08 MED ORDER — BUPIVACAINE-EPINEPHRINE 0.25% -1:200000 IJ SOLN
INTRAMUSCULAR | Status: DC | PRN
  Administered 2021-09-08: 30 mL

## 2021-09-08 MED ORDER — OXYCODONE HCL 5 MG PO TABS: 5.0000 mg | ORAL_TABLET | Freq: Once | ORAL | Status: DC | PRN

## 2021-09-08 MED ORDER — SUGAMMADEX SODIUM 200 MG/2ML IV SOLN
INTRAVENOUS | Status: DC | PRN
  Administered 2021-09-08: 200 mg via INTRAVENOUS

## 2021-09-08 MED ORDER — FENTANYL CITRATE (PF) 100 MCG/2ML IJ SOLN: 25.0000 ug | INTRAMUSCULAR | Status: DC | PRN

## 2021-09-08 MED ORDER — CHLORHEXIDINE GLUCONATE CLOTH 2 % EX PADS
6.0000 | MEDICATED_PAD | Freq: Once | CUTANEOUS | Status: AC
  Administered 2021-09-08: 06:00:00 6 via TOPICAL

## 2021-09-08 MED ORDER — PHENYLEPHRINE 40 MCG/ML (10ML) SYRINGE FOR IV PUSH (FOR BLOOD PRESSURE SUPPORT)
PREFILLED_SYRINGE | INTRAVENOUS | Status: DC | PRN
  Administered 2021-09-08: 160 ug via INTRAVENOUS

## 2021-09-08 MED ORDER — CHLORHEXIDINE GLUCONATE CLOTH 2 % EX PADS
6.0000 | MEDICATED_PAD | Freq: Every day | CUTANEOUS | Status: DC
  Administered 2021-09-09 – 2021-09-10 (×2): 6 via TOPICAL

## 2021-09-08 MED ORDER — ONDANSETRON HCL 4 MG/2ML IJ SOLN
INTRAMUSCULAR | Status: AC
  Filled 2021-09-08: qty 2

## 2021-09-08 MED ORDER — GLYCOPYRROLATE 0.2 MG/ML IJ SOLN
INTRAMUSCULAR | Status: DC | PRN
  Administered 2021-09-08 (×2): .2 mg via INTRAVENOUS

## 2021-09-08 SURGICAL SUPPLY — 52 items
ADAPTER CATH DIALYSIS 4X8 IT L (MISCELLANEOUS) IMPLANT
ADAPTER TITANIUM MEDIONICS (MISCELLANEOUS) ×3 IMPLANT
BIOPATCH WHT 1IN DISK W/4.0 H (GAUZE/BANDAGES/DRESSINGS) IMPLANT
BLADE CLIPPER SURG (BLADE) ×2 IMPLANT
CATH EXTENDED DIALYSIS (CATHETERS) ×3 IMPLANT
DERMABOND ADVANCED (GAUZE/BANDAGES/DRESSINGS) ×2
DERMABOND ADVANCED .7 DNX12 (GAUZE/BANDAGES/DRESSINGS) ×1 IMPLANT
ELECT CAUTERY BLADE 6.4 (BLADE) ×3 IMPLANT
ELECT REM PT RETURN 9FT ADLT (ELECTROSURGICAL) ×3
ELECTRODE REM PT RTRN 9FT ADLT (ELECTROSURGICAL) ×1 IMPLANT
GAUZE 4X4 16PLY ~~LOC~~+RFID DBL (SPONGE) ×3 IMPLANT
GLOVE SURG ENC MOIS LTX SZ7 (GLOVE) ×3 IMPLANT
GOWN STRL REUS W/ TWL LRG LVL3 (GOWN DISPOSABLE) ×2 IMPLANT
GOWN STRL REUS W/TWL LRG LVL3 (GOWN DISPOSABLE) ×4
GRASPER SUT TROCAR 14GX15 (MISCELLANEOUS) ×3 IMPLANT
IV NS 1000ML (IV SOLUTION) ×2
IV NS 1000ML BAXH (IV SOLUTION) ×1 IMPLANT
KIT TURNOVER KIT A (KITS) ×3 IMPLANT
MANIFOLD NEPTUNE II (INSTRUMENTS) ×3 IMPLANT
MINICAP W/POVIDONE IODINE SOL (MISCELLANEOUS) ×3 IMPLANT
NDL INSUFFLATION 14GA 120MM (NEEDLE) ×1 IMPLANT
NDL SAFETY ECLIPSE 18X1.5 (NEEDLE) ×1 IMPLANT
NEEDLE HYPO 18GX1.5 SHARP (NEEDLE) ×2
NEEDLE HYPO 22GX1.5 SAFETY (NEEDLE) ×3 IMPLANT
NEEDLE INSUFFLATION 14GA 120MM (NEEDLE) ×3 IMPLANT
NS IRRIG 500ML POUR BTL (IV SOLUTION) ×3 IMPLANT
PACK LAP CHOLECYSTECTOMY (MISCELLANEOUS) ×3 IMPLANT
PENCIL ELECTRO HAND CTR (MISCELLANEOUS) ×3 IMPLANT
SET CYSTO W/LG BORE CLAMP LF (SET/KITS/TRAYS/PACK) ×3 IMPLANT
SET TRANSFER 6 W/TWIST CLAMP 5 (SET/KITS/TRAYS/PACK) ×3 IMPLANT
SET TUBE SMOKE EVAC HIGH FLOW (TUBING) ×3 IMPLANT
SLEEVE ADV FIXATION 5X100MM (TROCAR) ×3 IMPLANT
SPONGE DRAIN TRACH 4X4 STRL 2S (GAUZE/BANDAGES/DRESSINGS) ×3 IMPLANT
SPONGE T-LAP 18X18 ~~LOC~~+RFID (SPONGE) ×3 IMPLANT
STYLET FALLER (MISCELLANEOUS) ×2 IMPLANT
STYLET FALLER MEDIONICS (MISCELLANEOUS) IMPLANT
SUT ETHILON 3-0 FS-10 30 BLK (SUTURE) ×3
SUT MNCRL 4-0 (SUTURE) ×2
SUT MNCRL 4-0 27XMFL (SUTURE) ×1
SUT VIC AB 3-0 SH 27 (SUTURE) ×2
SUT VIC AB 3-0 SH 27X BRD (SUTURE) ×1 IMPLANT
SUT VICRYL 0 AB UR-6 (SUTURE) ×6 IMPLANT
SUTURE EHLN 3-0 FS-10 30 BLK (SUTURE) ×1 IMPLANT
SUTURE MNCRL 4-0 27XMF (SUTURE) ×1 IMPLANT
SYR 20ML LL LF (SYRINGE) ×3 IMPLANT
SYR 3ML LL SCALE MARK (SYRINGE) ×3 IMPLANT
SYR TOOMEY IRRIG 70ML (MISCELLANEOUS) ×3
SYRINGE TOOMEY IRRIG 70ML (MISCELLANEOUS) ×1 IMPLANT
SYS KII FIOS ACCESS ABD 5X100 (TROCAR) ×3
SYSTEM KII FIOS ACES ABD 5X100 (TROCAR) ×1 IMPLANT
TROCAR XCEL NON-BLD 5MMX100MML (ENDOMECHANICALS) ×3 IMPLANT
WATER STERILE IRR 500ML POUR (IV SOLUTION) ×1 IMPLANT

## 2021-09-08 NOTE — Anesthesia Procedure Notes (Signed)
Procedure Name: Intubation Date/Time: 09/08/2021 11:20 AM Performed by: Tollie Eth, CRNA Pre-anesthesia Checklist: Patient identified, Patient being monitored, Timeout performed, Emergency Drugs available and Suction available Patient Re-evaluated:Patient Re-evaluated prior to induction Oxygen Delivery Method: Circle system utilized Preoxygenation: Pre-oxygenation with 100% oxygen Induction Type: IV induction Ventilation: Mask ventilation without difficulty Laryngoscope Size: McGraph and 4 Grade View: Grade I Tube type: Oral Tube size: 7.5 mm Number of attempts: 1 Airway Equipment and Method: Stylet and Video-laryngoscopy Placement Confirmation: ETT inserted through vocal cords under direct vision, positive ETCO2 and breath sounds checked- equal and bilateral Secured at: 21 cm Tube secured with: Tape Dental Injury: Teeth and Oropharynx as per pre-operative assessment

## 2021-09-08 NOTE — Progress Notes (Signed)
CBG at pm was 128 mg/dL. Patient refused his 2 units of insulin. Dr. Hal Hope notified without any new order.

## 2021-09-08 NOTE — Anesthesia Postprocedure Evaluation (Signed)
Anesthesia Post Note  Patient: Nathan [name]  Procedure(s) Performed: LAPAROSCOPIC INSERTION CONTINUOUS AMBULATORY PERITONEAL DIALYSIS  (CAPD) CATHETER  Patient location during evaluation: PACU Anesthesia Type: General Level of consciousness: awake and oriented Pain management: pain level controlled Respiratory status: respiratory function stable Cardiovascular status: stable Anesthetic complications: no   No notable events documented.   Last Vitals:  Vitals:   09/08/21 1245 09/08/21 1258  BP: (!) 157/77 (!) 164/76  Pulse: (!) 55 (!) 58  Resp: 20 10  Temp:  (!) 36.2 C  SpO2: 95% 95%    Last Pain:  Vitals:   09/08/21 1258  TempSrc:   PainSc: 4                  VAN STAVEREN,Paizleigh Wilds

## 2021-09-08 NOTE — Progress Notes (Signed)
PROGRESS NOTE    Nathan [name]  ZHG:[id number] DOB: 11-06-1960 DOA: 09/03/2021 PCP: Derinda Late, MD  Chief Complaint  Patient presents with   Foot Pain    Brief Narrative:  60 y.o. male with medical history significant for type 2 diabetes mellitus, hypertension, paroxysmal atrial fibrillation(he stopped taking Eliquis on his own due to bleeding), combined systolic and diastolic CHF and stage III chronic kidney disease, who presented to the ER with acute onset of bleeding from his left calf wound with cellulitis.  He's now undergone angioplasty by vascular notable for calcific PAD, but no focal stenosis.  His cellulitis and wound appear to be gradually improving with therapy.  Plan is for discharge on abx and outpatient wound center follow up.  His hospitalization was complicated by AKI on CKD and he was seen by nephrology and plan is to start PD.  Surgery placed PD catheter 12/20.  Possible discharge in AM with outpatient nephrology, surgery, and wound care follow up pending stability and therapy recs.     Assessment & Plan:   Principal Problem:   Left leg cellulitis Active Problems:   Non-healing wound of left lower extremity   Acute blood loss anemia   Acute kidney injury superimposed on CKD (HCC)   Acute on chronic HFrEF (heart failure with reduced ejection fraction) (HCC)   Atrial fibrillation, chronic (HCC)   Diabetes mellitus with diabetic neuropathy (HCC)   Essential hypertension   Hyperlipidemia   Abnormal CXR   * Left leg cellulitis Narrow to ancef with improvement  improving No crepitus, fluctuance, continue to monitor, low threshold for additional imaging ABI with noncompressible lower extremity arteries - monophasic waveforms bilaterally suggest bilateral LE PAD S/p aortogram and left lower extremity angiogram - no focal stenosis creating flow limitation Palpable distal DP pulses, appreciate vascular assistance given his wound Plan for outpatient follow up  with [facility]  Non-healing wound of left lower extremity Started with ruptured blister about 1 month ago Notably with peripheral neuropathy from below knees bilaterally  Appreciate wound care assistance and vascular assistance Vascular considering angiogram (as above) Wound care center outpatient  Acute blood loss anemia Chronic, but likely worsened recently with bleeding from wound He's stopped his eliquis - on aspirin, indication not totally clear to me, will hold S/p 1 unit pRBC - inappropriate bump stable  Anemia labs c/w AOCD Follow H/H post surgery  Acute kidney injury superimposed on CKD (HCC) AKI on CKD IV Baseline creatinine appears to be around 3.8 Presented with creatinine of 5.45 Creatinine 5.28 today Renal US with medical renal disease UA with >300 mg protein, 0-5 RBC's Appreciate renal assistance, they're considering dialysis -s/p PD catheter placement by general surgery 12/20   Acute on chronic HFrEF (heart failure with reduced ejection fraction) (HCC) Doesn't appear grossly overloaded at this time On coreg, imdur and hydralazine Lasix and metolazone on hold  Atrial fibrillation, chronic (HCC) eliquis on hold with bleeding, anemia (he notes he hasn't taken eliquis for maybe 6 months) Follow with PCP outaptient  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    Procedure(s)  Performed:             1.  Ultrasound guidance for vascular access right femoral artery             2.  Catheter placement into left SFA from right femoral approach             3.  Aortogram and selective left lower extremity angiogram             4.  StarClose closure device right femoral artery  Laparoscopic placement of peritoneal Dialysis catheter ( Total three cuffs) Laparoscopic Omentopexy  Antimicrobials:  Anti-infectives (From admission, onward)    Start     Dose/Rate Route Frequency Ordered Stop   09/08/21 1000  ceFAZolin (ANCEF) IVPB 2g/100 mL premix  Status:  Discontinued        2 g 200 mL/hr over 30 Minutes Intravenous  Once 09/07/21 1322 09/07/21 1349   09/08/21 0400  ceFAZolin (ANCEF) IVPB 1 g/50 mL premix        1 g 100 mL/hr over 30 Minutes Intravenous Every 12 hours 09/07/21 1057     09/08/21 0000  ceFAZolin (ANCEF) IVPB 1 g/50 mL premix  Status:  Discontinued       Note to Pharmacy: To be given in specials   1 g 100 mL/hr over 30 Minutes Intravenous 60 min pre-op 09/07/21 1650 09/07/21 1701   09/05/21 0300  cefTRIAXone (ROCEPHIN) 1 g in sodium chloride 0.9 % 100 mL IVPB  Status:  Discontinued        1 g 200 mL/hr over 30 Minutes Intravenous Every 24 hours 09/04/21 1906 09/07/21 1057   09/03/21 2200  ceFEPIme (MAXIPIME) 2 g in sodium chloride 0.9 % 100 mL IVPB  Status:  Discontinued        2 g 200 mL/hr over 30 Minutes Intravenous Every 24 hours 09/03/21 2028 09/04/21 1906   09/03/21 2032  vancomycin variable dose per unstable renal function (pharmacist dosing)  Status:  Discontinued         Does not apply See admin instructions 09/03/21 2032 09/07/21 1057   09/03/21 2030  vancomycin (VANCOCIN) IVPB 1000 mg/200 mL premix  Status:  Discontinued        1,000 mg 200 mL/hr over 60 Minutes Intravenous  Once 09/03/21 2028 09/03/21 2032   09/03/21 2000  vancomycin (VANCOREADY) IVPB 1500 mg/300 mL        1,500 mg 150 mL/hr over 120 Minutes Intravenous  Once 09/03/21 1921  09/04/21 0200       Subjective: Tired, weak  Objective: Vitals:   09/08/21 1245 09/08/21 1258 09/08/21 1538 09/08/21 1947  BP: (!) 157/77 (!) 164/76 (!) 167/84 (!) 105/57  Pulse: (!) 55 (!) 58 60 61  Resp: 20 10 18 15   Temp:  (!) 97.1 F (36.2 C) (!) 97.5 F (36.4 C) 98.2 F (36.8 C)  TempSrc:      SpO2: 95% 95% 99% 97%  Weight:      Height:        Intake/Output Summary (Last 24 hours) at 09/08/2021 2116 Last data filed at 09/08/2021 1500 Gross per 24 hour  Intake 2299.53 ml  Output 200 ml  Net 2099.53 ml   Filed Weights   09/03/21 1824  Weight: 65.8 kg    Examination:  General: No acute distress. Cardiovascular: RRR Lungs: unlabored Abdomen:  appropriately  tender post op, PD cath with dressing Neurological: Alert and oriented 3. Moves all extremities 4 . Cranial nerves II through XII grossly intact. Generalized weakness/ Skin: Warm and dry. No rashes or lesions. Extremities: stable wound with improved redness     Data Reviewed: I have personally reviewed following labs and imaging studies  CBC: Recent Labs  Lab 09/03/21 1858 09/04/21 0424 09/05/21 0813 09/05/21 1603 09/06/21 0335 09/07/21 0322 09/07/21 1516 09/08/21 0358  WBC 8.4 8.5 6.8  --  6.6 6.3  --  7.0  NEUTROABS 6.6  --  5.4  --  5.2 5.1  --  5.2  HGB 7.4* 6.9* 7.3* 7.5* 7.4* 7.1* 7.7* 7.5*  HCT 22.7* 21.5* 22.4* 22.7* 22.8* 21.7* 23.4* 23.5*  MCV 99.6 97.3 92.6  --  91.2 91.2  --  92.9  PLT 535* 448* 333  --  338 323  --  366    Basic Metabolic Panel: Recent Labs  Lab 09/04/21 0424 09/05/21 0323 09/05/21 0813 09/06/21 0335 09/07/21 0322 09/08/21 0358  NA 142  --  141 138 140 141  K 3.9  --  4.2 4.3 4.4 4.3  CL 114*  --  112* 111 113* 114*  CO2 21*  --  19* 18* 19* 18*  GLUCOSE 104*  --  103* 90 98 157*  BUN 106*  --  107* 101* 118* 117*  CREATININE 5.17* 5.45* 5.41* 5.28* 5.49* 5.09*  CALCIUM 7.7*  --  7.5* 8.1* 7.9* 7.7*  MG  --   --   --  2.6* 2.6* 2.6*  PHOS  --    --   --  6.2* 6.9* 6.8*    GFR: Estimated Creatinine Clearance: 14.7 mL/min (Aran Menning) (by C-G formula based on SCr of 5.09 mg/dL (H)).  Liver Function Tests: Recent Labs  Lab 09/05/21 0813 09/06/21 0335 09/07/21 0322 09/08/21 0358  AST 19 18 17 15   ALT 18 17 15 15   ALKPHOS 197* 191* 194* 196*  BILITOT 0.6 0.7 0.7 0.7  PROT 4.7* 5.0* 4.6* 4.8*  ALBUMIN 2.0* 2.2* 2.1* 2.2*    CBG: Recent Labs  Lab 09/08/21 0459 09/08/21 0744 09/08/21 1011 09/08/21 1242 09/08/21 1612  GLUCAP 148* 147* 129* 144* 145*     Recent Results (from the past 240 hour(s))  Resp Panel by RT-PCR (Flu Stevee Valenta&B, Covid) Nasopharyngeal Swab     Status: None   Collection Time: 09/03/21  8:30 PM   Specimen: Nasopharyngeal Swab; Nasopharyngeal(NP) swabs in vial transport medium  Result Value Ref Range Status   SARS Coronavirus 2 by RT PCR NEGATIVE NEGATIVE Final    Comment: (NOTE) SARS-CoV-2 target nucleic acids are NOT DETECTED.  The SARS-CoV-2 RNA is generally detectable in upper respiratory specimens during the acute phase of infection. The lowest concentration of SARS-CoV-2 viral copies this assay can detect is 138 copies/mL. Veleka Djordjevic negative result does not preclude SARS-Cov-2 infection and should not be used as the sole basis for treatment or other patient management decisions. Pressley Tadesse negative result may occur with  improper specimen collection/handling, submission of specimen other than nasopharyngeal swab, presence of viral mutation(s) within the areas targeted by this assay, and inadequate number of viral copies(<138 copies/mL). Desirae Mancusi negative result must be combined with clinical observations, patient history, and epidemiological information. The expected result is Negative.  Fact Sheet for Patients:  EntrepreneurPulse.com.au  Fact Sheet for Healthcare Providers:  IncredibleEmployment.be  This test is no t yet approved or cleared by the [place] FDA and  has been  authorized for detection and/or diagnosis of SARS-CoV-2 by FDA under an Emergency Use Authorization (EUA). This EUA will remain  in effect (meaning this test can be used) for the duration of the COVID-19 declaration under Section 564(b)(1) of the Act, 21 U.S.C.section 360bbb-3(b)(1), unless the authorization is terminated  or revoked sooner.       Influenza Kaige Whistler by PCR NEGATIVE NEGATIVE Final   Influenza B by PCR NEGATIVE NEGATIVE Final    Comment: (NOTE) The Xpert Xpress SARS-CoV-2/FLU/RSV plus assay is intended as an aid in the diagnosis of influenza from Nasopharyngeal swab specimens and should not be used as Maddex Garlitz sole basis for treatment. Nasal washings and aspirates are unacceptable for Xpert Xpress SARS-CoV-2/FLU/RSV testing.  Fact Sheet for Patients: EntrepreneurPulse.com.au  Fact Sheet for Healthcare Providers: IncredibleEmployment.be  This test is not yet approved or cleared by the [place] FDA and has been authorized for detection and/or diagnosis of SARS-CoV-2 by FDA under an Emergency Use Authorization (EUA). This EUA will remain in effect (meaning this test can be used) for the duration of the COVID-19 declaration under Section 564(b)(1) of the Act, 21 U.S.C. section 360bbb-3(b)(1), unless the authorization is terminated or revoked.  Performed at [facility], [address] [place], [place] [address]   MRSA Next Gen by PCR, Nasal     Status: None   Collection Time: 09/04/21 12:39 PM   Specimen: Nasal Mucosa; Nasal Swab  Result Value Ref Range Status   MRSA by PCR Next Gen NOT DETECTED NOT DETECTED Final    Comment: (NOTE) The GeneXpert MRSA Assay (FDA approved for NASAL specimens only), is one component of Nikolos Billig comprehensive MRSA colonization surveillance program. It is not intended to diagnose MRSA infection nor to guide or monitor treatment for MRSA infections. Test performance is not FDA approved in patients less  than 65 years old. Performed at [facility], [address] [place], [place] [address]          Radiology Studies: DG Chest 1 View  Result Date: 09/07/2021 CLINICAL DATA:  Kidney damage, history diabetes mellitus, hypertension, CHF, paroxysmal atrial fibrillation EXAM: CHEST  1 VIEW COMPARISON:  Portable exam 1356 hours compared to 03/12/2021 FINDINGS: Enlargement of cardiac silhouette. Mediastinal contours and pulmonary vascularity normal. Atherosclerotic calcification at aortic arch. Bronchitic changes with LEFT lower lobe consolidation consistent with pneumonia. Mild atelectasis versus infiltrate at medial RIGHT lung base as well. Tiny LEFT pleural effusion. No pneumothorax or acute osseous findings. IMPRESSION: LEFT lower lobe and question mild medial RIGHT lower lobe infiltrates consistent pneumonia. Tiny LEFT pleural effusion, decreased. Enlargement of cardiac silhouette. Aortic Atherosclerosis (ICD10-I70.0). Electronically Signed   By: Lavonia Dana M.D.   On: 09/07/2021 15:31   PERIPHERAL VASCULAR CATHETERIZATION  Result Date: 09/07/2021 See surgical note for result.       Scheduled Meds:  atorvastatin  40 mg Oral QHS   carvedilol  25 mg Oral BID WC   [START ON 09/09/2021] Chlorhexidine Gluconate Cloth  6 each Topical Q0600   ferrous sulfate  325 mg Oral Q breakfast   heparin injection (subcutaneous)  5,000 Units Subcutaneous Q8H   hydrALAZINE  50 mg Oral TID   insulin aspart  0-15 Units Subcutaneous Q4H   isosorbide mononitrate  30 mg Oral BID   multivitamin with minerals  1 tablet Oral Daily   Continuous Infusions:  sodium chloride 10 mL/hr at 09/07/21 0350   sodium chloride 75 mL/hr at 09/08/21 1845    ceFAZolin (ANCEF) IV 1 g (09/08/21  1716)     LOS: 5 days    Time spent: over 30 min    Fayrene Helper, MD [facility]   To contact the attending provider between 7A-7P or the covering provider during after hours 7P-7A, please log into  the web site [email or web address] and access using universal [place] password for that web site. If you do not have the password, please call the hospital operator.  09/08/2021, 9:16 PM

## 2021-09-08 NOTE — Op Note (Signed)
Laparoscopic placement of peritoneal Dialysis catheter ( Total three cuffs) Laparoscopic Omentopexy   Pre-operative Diagnosis: ESRD   Post-operative Diagnosis: same     Surgeon: Caroleen Hamman, MD FACS   Anesthesia: Gen. with endotracheal tube      Findings: Catheter within pelvis Good return after infusing Peritoneal cavity   Estimated Blood Loss: 10cc              Complications: none     Procedure Details  The patient was seen again in the Holding Room. The benefits, complications, treatment options, and expected outcomes were discussed with the patient. The risks of bleeding, infection, recurrence of symptoms, failure to resolve symptoms, catheter malfunction bowel injury, any of which could require further surgery were reviewed with the patient. The likelihood of improving the patient's symptoms with return to their baseline status is good.  The patient and/or family concurred with the proposed plan, giving informed consent.  The patient was taken to Operating Room, identified and the procedure verified. A Time Out was held and the above information confirmed.   Prior to the induction of general anesthesia, antibiotic prophylaxis was administered. VTE prophylaxis was in place. General endotracheal anesthesia was then administered and tolerated well. After the induction, the abdomen was prepped with Chloraprep and draped in the sterile fashion. The patient was positioned in the supine position.   Periumbilical incision created to the left of the midline.   The anterior rectus fascia identified and incised, rectus muscle identified and retracted laterally, using a port We were able to tunnel into the retro rectus space for about 6 cms. Peritoneum was pierced off the midline. Pneumoperitoneum was obtained w/o hemodynamic changes. We placed two additional laparoscopic ports under direct visualization.   We were able to thread the catheter via the laparoscopic port within the retrorectus  space in the standard fashion.  Under direct visualization we made sure that the coil portion of the catheter layed within the pelvis without any kinks.  I was able to also perform a counterincision in the left subcostal area and Lovena Le an additional extension of the catheter . The  Extension had 2 cuffs and I was able to connect it to parse together with the titanium connector in the standard fashion.  There was no evidence of any kinks.  The exit site was to the left upper quadrant. We instilled heparinized saline a liter into the pelvis and we had very good return. He did have an omentum that may interfere w PD, attention was then turned to the omentum and using a PMI device we were able to perform an omentopexy and tacked the omentum to the abdominal wall using 2 interrupted 2-0 Vicryl sutures in the standard fashion. All skin incisions  were infiltrated with a liposomal Marcaine. 4-0 subcuticular Monocryl was used to close the skin. Dermabond was  applied. Sterile dressing applied to the catheter. The patient was then extubated and brought to the recovery room in stable condition. Sponge, lap, and needle counts were correct at closure and at the conclusion of the case.               Caroleen Hamman, MD, FACS

## 2021-09-08 NOTE — Progress Notes (Signed)
Preoperative Review  ° °Patient is met in the preoperative holding area. The history is reviewed in the chart and with the patient. I personally reviewed the options and rationale as well as the risks of this procedure that have been previously discussed with the patient. All questions asked by the patient and/or family were answered to their satisfaction. ° °Patient agrees to proceed with this procedure at this time. ° °Rylynn Kobs M.D. FACS °  °

## 2021-09-08 NOTE — Progress Notes (Signed)
[facility]  ROUNDING NOTE   Subjective:   Nathan [name] is a 60 year old male with past medical conditions including diabetes, paroxysmal A. fib, diabetes, systolic and diastolic chronic heart failure, hypertension, and chronic kidney disease stage IV.  Patient reports to the emergency department with complaints of bleeding from his left calf wound.  Patient has been admitted for Hyperglycemia [R73.9] Cellulitis of left lower extremity [L03.116] AKI (acute kidney injury) ([place]) [N17.9] Left leg cellulitis [V61.607]  Patient is known to our office and is followed Dr. Lanora Manis.  He was last seen in the office March 25, 2021 for routine follow-up.    Update: Patient seen sitting at side of bed,  Currently n.p.o. for PD catheter placement Denies shortness of breath   Objective:  Vital signs in last 24 hours:  Temp:  [97.1 F (36.2 C)-98.5 F (36.9 C)] 98.5 F (36.9 C) (12/20 0959) Pulse Rate:  [61-70] 66 (12/20 0959) Resp:  [12-21] 20 (12/20 0959) BP: (108-169)/(56-82) 169/82 (12/20 0959) SpO2:  [95 %-98 %] 97 % (12/20 0959)  Weight change:  Filed Weights   09/03/21 1824  Weight: 65.8 kg    Intake/Output: I/O last 3 completed shifts: In: -  Out: 350 [Urine:350]   Intake/Output this shift:  Total I/O In: 1253.9 [I.V.:1253.9] Out: 200 [Urine:200]  Physical Exam: General: NAD, resting in bed  Head: Normocephalic, atraumatic. Moist oral mucosal membranes  Eyes: Anicteric  Lungs:  Clear to auscultation, normal effort, room air  Heart: Regular rate and rhythm  Abdomen:  Soft, nontender, nondistended  Extremities: trace peripheral edema.  Neurologic: Nonfocal, moving all four extremities  Skin: LLE wound       Basic Metabolic Panel: Recent Labs  Lab 09/04/21 0424 09/05/21 0323 09/05/21 0813 09/06/21 0335 09/07/21 0322 09/08/21 0358  NA 142  --  141 138 140 141  K 3.9  --  4.2 4.3 4.4 4.3  CL 114*  --  112* 111 113* 114*  CO2 21*  --  19*  18* 19* 18*  GLUCOSE 104*  --  103* 90 98 157*  BUN 106*  --  107* 101* 118* 117*  CREATININE 5.17* 5.45* 5.41* 5.28* 5.49* 5.09*  CALCIUM 7.7*  --  7.5* 8.1* 7.9* 7.7*  MG  --   --   --  2.6* 2.6* 2.6*  PHOS  --   --   --  6.2* 6.9* 6.8*     Liver Function Tests: Recent Labs  Lab 09/05/21 0813 09/06/21 0335 09/07/21 0322 09/08/21 0358  AST 19 18 17 15   ALT 18 17 15 15   ALKPHOS 197* 191* 194* 196*  BILITOT 0.6 0.7 0.7 0.7  PROT 4.7* 5.0* 4.6* 4.8*  ALBUMIN 2.0* 2.2* 2.1* 2.2*    No results for input(s): LIPASE, AMYLASE in the last 168 hours. No results for input(s): AMMONIA in the last 168 hours.  CBC: Recent Labs  Lab 09/03/21 1858 09/04/21 0424 09/05/21 0813 09/05/21 1603 09/06/21 0335 09/07/21 0322 09/07/21 1516 09/08/21 0358  WBC 8.4 8.5 6.8  --  6.6 6.3  --  7.0  NEUTROABS 6.6  --  5.4  --  5.2 5.1  --  5.2  HGB 7.4* 6.9* 7.3* 7.5* 7.4* 7.1* 7.7* 7.5*  HCT 22.7* 21.5* 22.4* 22.7* 22.8* 21.7* 23.4* 23.5*  MCV 99.6 97.3 92.6  --  91.2 91.2  --  92.9  PLT 535* 448* 333  --  338 323  --  343     Cardiac Enzymes: No  results for input(s): CKTOTAL, CKMB, CKMBINDEX, TROPONINI in the last 168 hours.  BNP: Invalid input(s): POCBNP  CBG: Recent Labs  Lab 09/07/21 1931 09/08/21 0026 09/08/21 0459 09/08/21 0744 09/08/21 1011  GLUCAP 128* 192* 148* 147* 129*     Microbiology: Results for orders placed or performed during the hospital encounter of 09/03/21  Resp Panel by RT-PCR (Flu A&B, Covid) Nasopharyngeal Swab     Status: None   Collection Time: 09/03/21  8:30 PM   Specimen: Nasopharyngeal Swab; Nasopharyngeal(NP) swabs in vial transport medium  Result Value Ref Range Status   SARS Coronavirus 2 by RT PCR NEGATIVE NEGATIVE Final    Comment: (NOTE) SARS-CoV-2 target nucleic acids are NOT DETECTED.  The SARS-CoV-2 RNA is generally detectable in upper respiratory specimens during the acute phase of infection. The lowest concentration of SARS-CoV-2  viral copies this assay can detect is 138 copies/mL. A negative result does not preclude SARS-Cov-2 infection and should not be used as the sole basis for treatment or other patient management decisions. A negative result may occur with  improper specimen collection/handling, submission of specimen other than nasopharyngeal swab, presence of viral mutation(s) within the areas targeted by this assay, and inadequate number of viral copies(<138 copies/mL). A negative result must be combined with clinical observations, patient history, and epidemiological information. The expected result is Negative.  Fact Sheet for Patients:  EntrepreneurPulse.com.au  Fact Sheet for Healthcare Providers:  IncredibleEmployment.be  This test is no t yet approved or cleared by the [place] FDA and  has been authorized for detection and/or diagnosis of SARS-CoV-2 by FDA under an Emergency Use Authorization (EUA). This EUA will remain  in effect (meaning this test can be used) for the duration of the COVID-19 declaration under Section 564(b)(1) of the Act, 21 U.S.C.section 360bbb-3(b)(1), unless the authorization is terminated  or revoked sooner.       Influenza A by PCR NEGATIVE NEGATIVE Final   Influenza B by PCR NEGATIVE NEGATIVE Final    Comment: (NOTE) The Xpert Xpress SARS-CoV-2/FLU/RSV plus assay is intended as an aid in the diagnosis of influenza from Nasopharyngeal swab specimens and should not be used as a sole basis for treatment. Nasal washings and aspirates are unacceptable for Xpert Xpress SARS-CoV-2/FLU/RSV testing.  Fact Sheet for Patients: EntrepreneurPulse.com.au  Fact Sheet for Healthcare Providers: IncredibleEmployment.be  This test is not yet approved or cleared by the [place] FDA and has been authorized for detection and/or diagnosis of SARS-CoV-2 by FDA under an Emergency Use Authorization  (EUA). This EUA will remain in effect (meaning this test can be used) for the duration of the COVID-19 declaration under Section 564(b)(1) of the Act, 21 U.S.C. section 360bbb-3(b)(1), unless the authorization is terminated or revoked.  Performed at [facility], [address] [place], [place] [address]   MRSA Next Gen by PCR, Nasal     Status: None   Collection Time: 09/04/21 12:39 PM   Specimen: Nasal Mucosa; Nasal Swab  Result Value Ref Range Status   MRSA by PCR Next Gen NOT DETECTED NOT DETECTED Final    Comment: (NOTE) The GeneXpert MRSA Assay (FDA approved for NASAL specimens only), is one component of a comprehensive MRSA colonization surveillance program. It is not intended to diagnose MRSA infection nor to guide or monitor treatment for MRSA infections. Test performance is not FDA approved in patients less than 60 years old. Performed at [facility], [address] [place], [place] [address]     Coagulation  Studies: No results for input(s): LABPROT, INR in the last 72 hours.  Urinalysis: No results for input(s): COLORURINE, LABSPEC, PHURINE, GLUCOSEU, HGBUR, BILIRUBINUR, KETONESUR, PROTEINUR, UROBILINOGEN, NITRITE, LEUKOCYTESUR in the last 72 hours.  Invalid input(s): APPERANCEUR     Imaging: DG Chest 1 View  Result Date: 09/07/2021 CLINICAL DATA:  Kidney damage, history diabetes mellitus, hypertension, CHF, paroxysmal atrial fibrillation EXAM: CHEST  1 VIEW COMPARISON:  Portable exam 1356 hours compared to 03/12/2021 FINDINGS: Enlargement of cardiac silhouette. Mediastinal contours and pulmonary vascularity normal. Atherosclerotic calcification at aortic arch. Bronchitic changes with LEFT lower lobe consolidation consistent with pneumonia. Mild atelectasis versus infiltrate at medial RIGHT lung base as well. Tiny LEFT pleural effusion. No pneumothorax or acute osseous findings. IMPRESSION: LEFT lower lobe and question mild medial RIGHT lower  lobe infiltrates consistent pneumonia. Tiny LEFT pleural effusion, decreased. Enlargement of cardiac silhouette. Aortic Atherosclerosis (ICD10-I70.0). Electronically Signed   By: Lavonia Dana M.D.   On: 09/07/2021 15:31   PERIPHERAL VASCULAR CATHETERIZATION  Result Date: 09/07/2021 See surgical note for result.    Medications:    [MAR Hold] sodium chloride 10 mL/hr at 09/07/21 0350   sodium chloride 75 mL/hr at 09/08/21 0906   [MAR Hold]  ceFAZolin (ANCEF) IV 1 g (09/08/21 0504)    [MAR Hold] atorvastatin  40 mg Oral QHS   [MAR Hold] carvedilol  25 mg Oral BID WC   [MAR Hold] Chlorhexidine Gluconate Cloth  6 each Topical Q0600   [MAR Hold] ferrous sulfate  325 mg Oral Q breakfast   [MAR Hold] heparin injection (subcutaneous)  5,000 Units Subcutaneous Q8H   [MAR Hold] hydrALAZINE  50 mg Oral TID   [MAR Hold] insulin aspart  0-15 Units Subcutaneous Q4H   [MAR Hold] isosorbide mononitrate  30 mg Oral BID   [MAR Hold] multivitamin with minerals  1 tablet Oral Daily   [MAR Hold] sodium chloride, [MAR Hold] acetaminophen **OR** [MAR Hold] acetaminophen, [MAR Hold]  HYDROmorphone (DILAUDID) injection, [MAR Hold]  morphine injection, [MAR Hold] ondansetron **OR** [MAR Hold] ondansetron (ZOFRAN) IV, [MAR Hold] ondansetron (ZOFRAN) IV, [MAR Hold] traZODone  Assessment/ Plan:  Nathan [name] is a 60 y.o.  male with past medical conditions including diabetes, paroxysmal A. fib, diabetes, systolic and diastolic chronic heart failure, hypertension, and chronic kidney disease stage IV.  Patient reports to the emergency department with complaints of bleeding from his left calf wound.  Patient has been admitted for Hyperglycemia [R73.9] Cellulitis of left lower extremity [L03.116] AKI (acute kidney injury) ([place]) [N17.9] Left leg cellulitis [L03.116]   End stage renal disease requiring dialysis.  Continue to hold diuretics Patient has been on aggressive diuresis outpatient, furosemide 80 mg  twice daily and metolazone 2.5 mg twice daily.    -Renal function stable between 11 and 12%  -Appreciate surgery placing PD catheter today.  -PD urgent start requested from [employer]       Lab Results  Component Value Date   CREATININE 5.09 (H) 09/08/2021   CREATININE 5.49 (H) 09/07/2021   CREATININE 5.28 (H) 09/06/2021    Intake/Output Summary (Last 24 hours) at 09/08/2021 1146 Last data filed at 09/08/2021 1107 Gross per 24 hour  Intake 1253.94 ml  Output 200 ml  Net 1053.94 ml    2. Anemia of chronic kidney disease Lab Results  Component Value Date   HGB 7.5 (L) 09/08/2021    EPO 20,000 units subcu received earlier this week  3. Secondary Hyperparathyroidism: with outpatient labs: phosphorus 5.5, calcium 7.8  on 03/25/21.   Lab Results  Component Value Date   CALCIUM 7.7 (L) 09/08/2021   CAION 1.05 (L) 06/26/2019   PHOS 6.8 (H) 09/08/2021    Calcium and phosphorus remain out of desired range.  We will continue to monitor  4.  Chronic systolic heart failure.  Echo from 03/14/2021 shows EF 40 to 45% with a grade 2 diastolic dysfunction and mild LVH.    -Diuretics held   5. LLE cellulitis, Wound culture obtained Vascular consulted for angiogram, concerned with increased renal function.  Appreciate vascular performing angiogram of LLE yesterday.  Determined to have adequate perfusion for wound healing, no intervention necessary.  Currently remains on cefazolin   LOS: 5 Geovany Trudo 12/20/202211:46 AM

## 2021-09-08 NOTE — Transfer of Care (Signed)
Immediate Anesthesia Transfer of Care Note  Patient: Mamadou A Janvier  Procedure(s) Performed: LAPAROSCOPIC INSERTION CONTINUOUS AMBULATORY PERITONEAL DIALYSIS  (CAPD) CATHETER  Patient Location: PACU  Anesthesia Type:General  Level of Consciousness: drowsy  Airway & Oxygen Therapy: Patient Spontanous Breathing and Patient connected to face mask oxygen  Post-op Assessment: Report given to RN and Post -op Vital signs reviewed and stable  Post vital signs: Reviewed and stable  Last Vitals:  Vitals Value Taken Time  BP 123/69 09/08/21 1231  Temp    Pulse 54 09/08/21 1234  Resp 7 09/08/21 1234  SpO2 99 % 09/08/21 1234  Vitals shown include unvalidated device data.  Last Pain:  Vitals:   09/08/21 0959  TempSrc: Oral  PainSc:          Complications: No notable events documented.

## 2021-09-08 NOTE — Anesthesia Preprocedure Evaluation (Addendum)
Anesthesia Evaluation  Patient identified by MRN, date of birth, ID band Patient awake    Reviewed: Allergy & Precautions, NPO status , Patient's Chart, lab work & pertinent test results  Airway Mallampati: II  TM Distance: >3 FB Neck ROM: full    Dental  (+) Teeth Intact   Pulmonary neg pulmonary ROS,    Pulmonary exam normal breath sounds clear to auscultation       Cardiovascular hypertension, Pt. on medications +CHF and + DOE  negative cardio ROS Normal cardiovascular exam+ dysrhythmias (a fib, discontinued Eliquis several months ago)  Rhythm:Regular Rate:Normal     Neuro/Psych  Neuromuscular disease (diabetic neuropathy) negative neurological ROS  negative psych ROS   GI/Hepatic negative GI ROS, Neg liver ROS,   Endo/Other  negative endocrine ROSdiabetes, Type 2, Insulin Dependent  Renal/GU CRFRenal disease (AKI on stage IV CKD)     Musculoskeletal negative musculoskeletal ROS (+)   Abdominal Normal abdominal exam  (+)   Peds negative pediatric ROS (+)  Hematology negative hematology ROS (+) Blood dyscrasia, anemia ,   Anesthesia Other Findings Past Medical History: No date: Anemia of chronic disease No date: Chronic combined systolic (congestive) and diastolic  (congestive) heart failure ([place])     Comment:  a. 05/2019 Echo: EF 45-50%, nl LA/RA size. Mild to mod               TR. No date: CKD (chronic kidney disease), stage III (HCC) No date: Diabetes mellitus without complication (HCC) No date: Hypertension No date: Left Hydropneumothorax/Fibrinopurulent empyema     Comment:  a. 06/2019 s/p VATS. No date: PAF (paroxysmal atrial fibrillation) ([place])     Comment:  a. 05/2019 in setting of sepsis-->s/p TEE/DCCV; b.               CHA2DS2VASc = 2-->Eliquis. No date: Sepsis [facility])     Comment:  a. 05/2019 MRSA in setting of infected R thumb.  Past Surgical History: No date: HAND SURGERY; Right 06/11/2019: I &  D EXTREMITY; Right     Comment:  Procedure: IRRIGATION AND DEBRIDEMENT RIGHT THUMB;                Surgeon: Dereck Leep, MD;  Location: [facility] ORS;                Service: Orthopedics;  Laterality: Right; 09/07/2021: LOWER EXTREMITY ANGIOGRAPHY; Left     Comment:  Procedure: LOWER EXTREMITY ANGIOGRAPHY;  Surgeon: Algernon Huxley, MD;  Location: [place] CV LAB;  Service:               Cardiovascular;  Laterality: Left; 06/14/2019: TEE WITHOUT CARDIOVERSION; N/A     Comment:  Procedure: TRANSESOPHAGEAL ECHOCARDIOGRAM (TEE);                Surgeon: Minna Merritts, MD;  Location: [facility] ORS;                Service: Cardiovascular;  Laterality: N/A; 06/26/2019: VIDEO ASSISTED THORACOSCOPY (VATS)/THOROCOTOMY; Left     Comment:  Procedure: [place];                Surgeon: Lajuana Matte, MD;  Location: [place];                Service: Thoracic;  Laterality: Left; 06/26/2019: VIDEO BRONCHOSCOPY; N/A     Comment:  Procedure: VIDEO BRONCHOSCOPY;  Surgeon:  Lajuana Matte, MD;  Location: [facility] OR;  Service: Thoracic;                Laterality: N/A;  BMI    Body Mass Index: 21.41 kg/m      Reproductive/Obstetrics negative OB ROS                            Anesthesia Physical Anesthesia Plan  ASA: 4  Anesthesia Plan: General   Post-op Pain Management:    Induction: Intravenous  PONV Risk Score and Plan: 2 and Ondansetron, Dexamethasone and Treatment may vary due to age or medical condition  Airway Management Planned: Oral ETT  Additional Equipment:   Intra-op Plan:   Post-operative Plan: Extubation in OR  Informed Consent: I have reviewed the patients History and Physical, chart, labs and discussed the procedure including the risks, benefits and alternatives for the proposed anesthesia with the patient or authorized representative who has indicated his/her understanding and acceptance.      Dental advisory given  Plan Discussed with: CRNA and Surgeon  Anesthesia Plan Comments: (Patient consented for risks of anesthesia including but not limited to:  - adverse reactions to medications - damage to eyes, teeth, lips or other oral mucosa - nerve damage due to positioning  - sore throat or hoarseness - damage to heart, brain, nerves, lungs, other parts of body or loss of life  Informed patient about role of CRNA in peri- and intra-operative care.  Patient voiced understanding.)       Anesthesia Quick Evaluation

## 2021-09-09 ENCOUNTER — Inpatient Hospital Stay: Payer: Medicaid Other

## 2021-09-09 ENCOUNTER — Encounter: Payer: Self-pay | Admitting: Surgery

## 2021-09-09 LAB — MAGNESIUM: Magnesium: 2.5 mg/dL — ABNORMAL HIGH (ref 1.7–2.4)

## 2021-09-09 LAB — COMPREHENSIVE METABOLIC PANEL
ALT: 14 U/L (ref 0–44)
AST: 15 U/L (ref 15–41)
Albumin: 2.2 g/dL — ABNORMAL LOW (ref 3.5–5.0)
Alkaline Phosphatase: 175 U/L — ABNORMAL HIGH (ref 38–126)
Anion gap: 8 (ref 5–15)
BUN: 102 mg/dL — ABNORMAL HIGH (ref 6–20)
CO2: 17 mmol/L — ABNORMAL LOW (ref 22–32)
Calcium: 7.6 mg/dL — ABNORMAL LOW (ref 8.9–10.3)
Chloride: 115 mmol/L — ABNORMAL HIGH (ref 98–111)
Creatinine, Ser: 5.2 mg/dL — ABNORMAL HIGH (ref 0.61–1.24)
GFR, Estimated: 12 mL/min — ABNORMAL LOW (ref >60)
Glucose, Bld: 153 mg/dL — ABNORMAL HIGH (ref 70–99)
Potassium: 4.5 mmol/L (ref 3.5–5.1)
Sodium: 140 mmol/L (ref 135–145)
Total Bilirubin: 0.7 mg/dL (ref 0.3–1.2)
Total Protein: 4.7 g/dL — ABNORMAL LOW (ref 6.5–8.1)

## 2021-09-09 LAB — CBC WITH DIFFERENTIAL/PLATELET
Abs Immature Granulocytes: 0.18 10*3/uL — ABNORMAL HIGH (ref 0.00–0.07)
Basophils Absolute: 0 10*3/uL (ref 0.0–0.1)
Basophils Relative: 0 %
Eosinophils Absolute: 0 10*3/uL (ref 0.0–0.5)
Eosinophils Relative: 0 %
HCT: 25 % — ABNORMAL LOW (ref 39.0–52.0)
Hemoglobin: 8 g/dL — ABNORMAL LOW (ref 13.0–17.0)
Immature Granulocytes: 1 %
Lymphocytes Relative: 2 %
Lymphs Abs: 0.2 10*3/uL — ABNORMAL LOW (ref 0.7–4.0)
MCH: 30.7 pg (ref 26.0–34.0)
MCHC: 32 g/dL (ref 30.0–36.0)
MCV: 95.8 fL (ref 80.0–100.0)
Monocytes Absolute: 1.1 10*3/uL — ABNORMAL HIGH (ref 0.1–1.0)
Monocytes Relative: 9 %
Neutro Abs: 11.4 10*3/uL — ABNORMAL HIGH (ref 1.7–7.7)
Neutrophils Relative %: 88 %
Platelets: 334 10*3/uL (ref 150–400)
RBC: 2.61 MIL/uL — ABNORMAL LOW (ref 4.22–5.81)
RDW: 17.3 % — ABNORMAL HIGH (ref 11.5–15.5)
WBC: 13 10*3/uL — ABNORMAL HIGH (ref 4.0–10.5)
nRBC: 0.2 % (ref 0.0–0.2)

## 2021-09-09 LAB — GLUCOSE, CAPILLARY
Glucose-Capillary: 130 mg/dL — ABNORMAL HIGH (ref 70–99)
Glucose-Capillary: 153 mg/dL — ABNORMAL HIGH (ref 70–99)
Glucose-Capillary: 178 mg/dL — ABNORMAL HIGH (ref 70–99)
Glucose-Capillary: 175 mg/dL — ABNORMAL HIGH (ref 70–99)

## 2021-09-09 LAB — PHOSPHORUS: Phosphorus: 6.9 mg/dL — ABNORMAL HIGH (ref 2.5–4.6)

## 2021-09-09 MED ORDER — INSULIN ASPART 100 UNIT/ML IJ SOLN: 0.0000 [IU] | Freq: Three times a day (TID) | INTRAMUSCULAR | Status: DC

## 2021-09-09 MED ORDER — SODIUM CHLORIDE 0.9 % IV SOLN
2.0000 g | INTRAVENOUS | Status: DC
  Administered 2021-09-09: 18:00:00 2 g via INTRAVENOUS
  Filled 2021-09-09: qty 20
  Filled 2021-09-09: qty 2

## 2021-09-09 MED ORDER — HEPARIN SODIUM (PORCINE) 5000 UNIT/ML IJ SOLN
5000.0000 [IU] | Freq: Three times a day (TID) | INTRAMUSCULAR | Status: DC
  Administered 2021-09-09 – 2021-09-10 (×3): 5000 [IU] via SUBCUTANEOUS
  Filled 2021-09-09 (×3): qty 1

## 2021-09-09 MED ORDER — FUROSEMIDE 40 MG PO TABS
80.0000 mg | ORAL_TABLET | Freq: Every day | ORAL | Status: DC
  Administered 2021-09-09 – 2021-09-10 (×2): 80 mg via ORAL
  Filled 2021-09-09 (×2): qty 2

## 2021-09-09 NOTE — Progress Notes (Signed)
[facility]  ROUNDING NOTE   Subjective:   Regis Hinton Wisham is a 60 year old male with past medical conditions including diabetes, paroxysmal A. fib, diabetes, systolic and diastolic chronic heart failure, hypertension, and chronic kidney disease stage IV.  Patient reports to the emergency department with complaints of bleeding from his left calf wound.  Patient has been admitted for Hyperglycemia [R73.9] Cellulitis of left lower extremity [L03.116] AKI (acute kidney injury) ([place]) [N17.9] Left leg cellulitis [H41.937]  Patient is known to our office and is followed Dr. Lanora Manis.  He was last seen in the office March 25, 2021 for routine follow-up.    Update: Patient appears to be resting comfortably in bed Alert and oriented Complains of abdominal soreness from procedure yesterday Tolerating meals without nausea Denies shortness of breath Complains of lower extremity edema.   Objective:  Vital signs in last 24 hours:  Temp:  [97.1 F (36.2 C)-98.5 F (36.9 C)] 97.6 F (36.4 C) (12/21 1206) Pulse Rate:  [55-66] 62 (12/21 1206) Resp:  [10-20] 17 (12/21 1206) BP: (105-167)/(56-84) 129/75 (12/21 1206) SpO2:  [94 %-99 %] 98 % (12/21 1206)  Weight change:  Filed Weights   09/03/21 1824  Weight: 65.8 kg    Intake/Output: I/O last 3 completed shifts: In: 2299.5 [I.V.:2149.5; IV Piggyback:150] Out: 200 [Urine:200]   Intake/Output this shift:  No intake/output data recorded.  Physical Exam: General: NAD, resting in bed  Head: Normocephalic, atraumatic. Moist oral mucosal membranes  Eyes: Anicteric  Lungs:  Clear to auscultation, normal effort, room air  Heart: Regular rate and rhythm  Abdomen:  Soft, tender, nondistended, PD catheter LUQ-surgical dressing with old bloody drainage  Extremities: trace peripheral edema.  Neurologic: Nonfocal, moving all four extremities  Skin: LLE wound, ABD lap sites, surgi-glue       Basic Metabolic Panel: Recent Labs   Lab 09/05/21 0813 09/06/21 0335 09/07/21 0322 09/08/21 0358 09/09/21 0317  NA 141 138 140 141 140  K 4.2 4.3 4.4 4.3 4.5  CL 112* 111 113* 114* 115*  CO2 19* 18* 19* 18* 17*  GLUCOSE 103* 90 98 157* 153*  BUN 107* 101* 118* 117* 102*  CREATININE 5.41* 5.28* 5.49* 5.09* 5.20*  CALCIUM 7.5* 8.1* 7.9* 7.7* 7.6*  MG  --  2.6* 2.6* 2.6* 2.5*  PHOS  --  6.2* 6.9* 6.8* 6.9*     Liver Function Tests: Recent Labs  Lab 09/05/21 0813 09/06/21 0335 09/07/21 0322 09/08/21 0358 09/09/21 0317  AST 19 18 17 15 15   ALT 18 17 15 15 14   ALKPHOS 197* 191* 194* 196* 175*  BILITOT 0.6 0.7 0.7 0.7 0.7  PROT 4.7* 5.0* 4.6* 4.8* 4.7*  ALBUMIN 2.0* 2.2* 2.1* 2.2* 2.2*    No results for input(s): LIPASE, AMYLASE in the last 168 hours. No results for input(s): AMMONIA in the last 168 hours.  CBC: Recent Labs  Lab 09/05/21 0813 09/05/21 1603 09/06/21 0335 09/07/21 0322 09/07/21 1516 09/08/21 0358 09/08/21 2234 09/09/21 0317  WBC 6.8  --  6.6 6.3  --  7.0  --  13.0*  NEUTROABS 5.4  --  5.2 5.1  --  5.2  --  11.4*  HGB 7.3*   < > 7.4* 7.1* 7.7* 7.5* 8.2* 8.0*  HCT 22.4*   < > 22.8* 21.7* 23.4* 23.5* 26.1* 25.0*  MCV 92.6  --  91.2 91.2  --  92.9  --  95.8  PLT 333  --  338 323  --  343  --  334   < > = values in this interval not displayed.     Cardiac Enzymes: No results for input(s): CKTOTAL, CKMB, CKMBINDEX, TROPONINI in the last 168 hours.  BNP: Invalid input(s): POCBNP  CBG: Recent Labs  Lab 09/08/21 1242 09/08/21 1612 09/08/21 2118 09/09/21 0757 09/09/21 1205  GLUCAP 144* 145* 177* 130* 153*     Microbiology: Results for orders placed or performed during the hospital encounter of 09/03/21  Resp Panel by RT-PCR (Flu A&B, Covid) Nasopharyngeal Swab     Status: None   Collection Time: 09/03/21  8:30 PM   Specimen: Nasopharyngeal Swab; Nasopharyngeal(NP) swabs in vial transport medium  Result Value Ref Range Status   SARS Coronavirus 2 by RT PCR NEGATIVE  NEGATIVE Final    Comment: (NOTE) SARS-CoV-2 target nucleic acids are NOT DETECTED.  The SARS-CoV-2 RNA is generally detectable in upper respiratory specimens during the acute phase of infection. The lowest concentration of SARS-CoV-2 viral copies this assay can detect is 138 copies/mL. A negative result does not preclude SARS-Cov-2 infection and should not be used as the sole basis for treatment or other patient management decisions. A negative result may occur with  improper specimen collection/handling, submission of specimen other than nasopharyngeal swab, presence of viral mutation(s) within the areas targeted by this assay, and inadequate number of viral copies(<138 copies/mL). A negative result must be combined with clinical observations, patient history, and epidemiological information. The expected result is Negative.  Fact Sheet for Patients:  EntrepreneurPulse.com.au  Fact Sheet for Healthcare Providers:  IncredibleEmployment.be  This test is no t yet approved or cleared by the [place] FDA and  has been authorized for detection and/or diagnosis of SARS-CoV-2 by FDA under an Emergency Use Authorization (EUA). This EUA will remain  in effect (meaning this test can be used) for the duration of the COVID-19 declaration under Section 564(b)(1) of the Act, 21 U.S.C.section 360bbb-3(b)(1), unless the authorization is terminated  or revoked sooner.       Influenza A by PCR NEGATIVE NEGATIVE Final   Influenza B by PCR NEGATIVE NEGATIVE Final    Comment: (NOTE) The Xpert Xpress SARS-CoV-2/FLU/RSV plus assay is intended as an aid in the diagnosis of influenza from Nasopharyngeal swab specimens and should not be used as a sole basis for treatment. Nasal washings and aspirates are unacceptable for Xpert Xpress SARS-CoV-2/FLU/RSV testing.  Fact Sheet for Patients: EntrepreneurPulse.com.au  Fact Sheet for Healthcare  Providers: IncredibleEmployment.be  This test is not yet approved or cleared by the [place] FDA and has been authorized for detection and/or diagnosis of SARS-CoV-2 by FDA under an Emergency Use Authorization (EUA). This EUA will remain in effect (meaning this test can be used) for the duration of the COVID-19 declaration under Section 564(b)(1) of the Act, 21 U.S.C. section 360bbb-3(b)(1), unless the authorization is terminated or revoked.  Performed at [facility], [address] [place], [place] [address]   MRSA Next Gen by PCR, Nasal     Status: None   Collection Time: 09/04/21 12:39 PM   Specimen: Nasal Mucosa; Nasal Swab  Result Value Ref Range Status   MRSA by PCR Next Gen NOT DETECTED NOT DETECTED Final    Comment: (NOTE) The GeneXpert MRSA Assay (FDA approved for NASAL specimens only), is one component of a comprehensive MRSA colonization surveillance program. It is not intended to diagnose MRSA infection nor to guide or monitor treatment for MRSA infections. Test performance is not FDA approved in patients less than 2  years old. Performed at [facility], [address] [place], [place] [address]     Coagulation Studies: No results for input(s): LABPROT, INR in the last 72 hours.  Urinalysis: No results for input(s): COLORURINE, LABSPEC, PHURINE, GLUCOSEU, HGBUR, BILIRUBINUR, KETONESUR, PROTEINUR, UROBILINOGEN, NITRITE, LEUKOCYTESUR in the last 72 hours.  Invalid input(s): APPERANCEUR     Imaging: DG Chest 1 View  Result Date: 09/07/2021 CLINICAL DATA:  Kidney damage, history diabetes mellitus, hypertension, CHF, paroxysmal atrial fibrillation EXAM: CHEST  1 VIEW COMPARISON:  Portable exam 1356 hours compared to 03/12/2021 FINDINGS: Enlargement of cardiac silhouette. Mediastinal contours and pulmonary vascularity normal. Atherosclerotic calcification at aortic arch. Bronchitic changes with LEFT lower lobe  consolidation consistent with pneumonia. Mild atelectasis versus infiltrate at medial RIGHT lung base as well. Tiny LEFT pleural effusion. No pneumothorax or acute osseous findings. IMPRESSION: LEFT lower lobe and question mild medial RIGHT lower lobe infiltrates consistent pneumonia. Tiny LEFT pleural effusion, decreased. Enlargement of cardiac silhouette. Aortic Atherosclerosis (ICD10-I70.0). Electronically Signed   By: Lavonia Dana M.D.   On: 09/07/2021 15:31   PERIPHERAL VASCULAR CATHETERIZATION  Result Date: 09/07/2021 See surgical note for result.  DG Chest Port 1 View  Result Date: 09/09/2021 CLINICAL DATA:  Abnormal chest radiograph, follow-up; history diabetes mellitus, hypertension, CHF, atrial fibrillation EXAM: PORTABLE CHEST 1 VIEW COMPARISON:  Portable exam 0704 hours compared to 09/07/2021 FINDINGS: Enlargement of cardiac silhouette. Mediastinal contours and pulmonary vascularity normal. Persistent LEFT lower lobe consolidation. Minimal RIGHT basilar atelectasis. No pleural effusion or pneumothorax. Osseous structures unremarkable. IMPRESSION: Enlargement of cardiac silhouette with mild RIGHT basilar atelectasis. Persistent LEFT lower lobe consolidation consistent with pneumonia. Follow-up radiographs recommended until resolution to exclude underlying abnormalities. Electronically Signed   By: Lavonia Dana M.D.   On: 09/09/2021 09:58     Medications:    sodium chloride 10 mL/hr at 09/07/21 0350   sodium chloride 75 mL/hr at 09/08/21 1845    ceFAZolin (ANCEF) IV 1 g (09/09/21 0500)    atorvastatin  40 mg Oral QHS   carvedilol  25 mg Oral BID WC   Chlorhexidine Gluconate Cloth  6 each Topical Q0600   ferrous sulfate  325 mg Oral Q breakfast   furosemide  80 mg Oral Daily   hydrALAZINE  50 mg Oral TID   insulin aspart  0-15 Units Subcutaneous Q4H   isosorbide mononitrate  30 mg Oral BID   multivitamin with minerals  1 tablet Oral Daily   sodium chloride, acetaminophen **OR**  acetaminophen, HYDROmorphone (DILAUDID) injection, morphine injection, ondansetron **OR** ondansetron (ZOFRAN) IV, ondansetron (ZOFRAN) IV, oxyCODONE, phenol, traZODone  Assessment/ Plan:  Mr. Alam Guterrez Habib is a 60 y.o.  male with past medical conditions including diabetes, paroxysmal A. fib, diabetes, systolic and diastolic chronic heart failure, hypertension, and chronic kidney disease stage IV.  Patient reports to the emergency department with complaints of bleeding from his left calf wound.  Patient has been admitted for Hyperglycemia [R73.9] Cellulitis of left lower extremity [L03.116] AKI (acute kidney injury) ([place]) [N17.9] Left leg cellulitis [L03.116]   End stage renal disease requiring dialysis.  Continue to hold diuretics Patient has been on aggressive diuresis outpatient, furosemide 80 mg twice daily and metolazone 2.5 mg twice daily.    -Appreciate surgery placing PD catheter on 09/08/2021.  -Plans to urgent start patient at [facility] next week.  -Renal function remains at 12%    Lab Results  Component Value Date   CREATININE 5.20 (H) 09/09/2021   CREATININE 5.09 (  H) 09/08/2021   CREATININE 5.49 (H) 09/07/2021    Intake/Output Summary (Last 24 hours) at 09/09/2021 1242 Last data filed at 09/08/2021 1500 Gross per 24 hour  Intake 545.59 ml  Output --  Net 545.59 ml    2. Anemia of chronic kidney disease Lab Results  Component Value Date   HGB 8.0 (L) 09/09/2021    Subcu EPO 20,000 units given weekly  3. Secondary Hyperparathyroidism: with outpatient labs: phosphorus 5.5, calcium 7.8 on 03/25/21.   Lab Results  Component Value Date   CALCIUM 7.6 (L) 09/09/2021   CAION 1.05 (L) 06/26/2019   PHOS 6.9 (H) 09/09/2021    Corrected calcium of 9.  We will continue to monitor  4.  Chronic systolic heart failure.  Echo from 03/14/2021 shows EF 40 to 45% with a grade 2 diastolic dysfunction and mild LVH.    -Cleared to restart Furosemide 80mg  daily  5.  LLE cellulitis, Wound culture obtained Vascular consulted for angiogram, concerned with increased renal function.  Appreciate vascular performing angiogram of LLE yesterday.  Determined to have adequate perfusion for wound healing, no intervention necessary.  Continue cefazolin   LOS: 6 Sharonda Llamas 12/21/202212:42 PM

## 2021-09-09 NOTE — Evaluation (Signed)
Physical Therapy Evaluation Patient Details Name: Nathan [name] MRN: [id number] DOB: 1961-03-27 Today's Date: 09/09/2021  History of Present Illness  60 y.o. male with medical history significant for type 2 diabetes mellitus, hypertension, paroxysmal atrial fibrillation(he stopped taking Eliquis on his own due to bleeding), combined systolic and diastolic CHF and stage III chronic kidney disease, who presented to the ER with acute onset of bleeding from his left calf wound with cellulitis.  He's now undergone angioplasty by vascular notable for calcific PAD, but no focal stenosis. His hospitalization was complicated by AKI on CKD and he was seen by nephrology and plan is to start PD.  Surgery placed PD catheter 12/20.  Clinical Impression  Pt received supine in bed, agreeable to therapy. He was able to perform bed mobility with SUP; logroll technique taught for pt comfort. STS and ambulation CGA initially for safety due to pt technique and denying use of AD however progressed to SUP. Pt reaching for furniture (bed rail and sink counter) to walk within the room. He ambulated 64ft, a typical distance he is able to ambulate within home. Pt sleeps in recliner and will not need to remember logroll once home. Standing stability mildly impaired; generally weak in BLE. However pt is functional to return home. Would benefit from skilled PT to address above deficits and promote optimal return to PLOF.    Recommendations for follow up therapy are one component of a multi-disciplinary discharge planning process, led by the attending physician.  Recommendations may be updated based on patient status, additional functional criteria and insurance authorization.  Follow Up Recommendations Home health PT    Assistance Recommended at Discharge PRN  Functional Status Assessment Patient has had a recent decline in their functional status and demonstrates the ability to make significant improvements in function in  a reasonable and predictable amount of time.  Equipment Recommendations  None recommended by PT    Recommendations for Other Services       Precautions / Restrictions Precautions Precautions: Fall Restrictions Weight Bearing Restrictions: No      Mobility  Bed Mobility Overal bed mobility: Needs Assistance Bed Mobility: Supine to Sit     Supine to sit: Supervision;HOB elevated     General bed mobility comments: SUP for safety, increased time and effort required. Pt remained sitting EOB at end of session.    Transfers Overall transfer level: Needs assistance Equipment used: None Transfers: Sit to/from Stand Sit to Stand: Min guard           General transfer comment: CGA for safety first rep; progressed to SUP for second rep    Ambulation/Gait Ambulation/Gait assistance: Min guard Gait Distance (Feet): 30 Feet Assistive device: None Gait Pattern/deviations: Step-through pattern;Trunk flexed Gait velocity: decreased     General Gait Details: Pt reaching for furniture (bed rail and sink counter) to walk within the room. Pt refusing to use AD. CGA at all times however no steadying required.  Stairs            Wheelchair Mobility    Modified Rankin (Stroke Patients Only)       Balance Overall balance assessment: Needs assistance Sitting-balance support: No upper extremity supported;Feet supported Sitting balance-Leahy Scale: Good Sitting balance - Comments: no concerns   Standing balance support: Single extremity supported;During functional activity Standing balance-Leahy Scale: Fair Standing balance comment: light reliance on UE support during ambulation. No LOB during mobility.  Pertinent Vitals/Pain Pain Assessment: No/denies pain    Home Living Family/patient expects to be discharged to:: Private residence Living Arrangements: Spouse/significant other Available Help at Discharge: Family;Other  (Comment) (minimal assist from wife - pt states her physical condition is similar to his) Type of Home: House Home Access: Stairs to enter Entrance Stairs-Rails: Left Entrance Stairs-Number of Steps: 10 small 3" steps   Home Layout: One level Home Equipment: Rolling Walker (2 wheels) Additional Comments: does not use RW. Pt walks with walking stick outside and reaches for walls/furniture within the home.    Prior Function Prior Level of Function : Independent/Modified Independent             Mobility Comments: reports he is able to ambulate household distances ADLs Comments: Reports independent with showering/dressing/light cooking. States he does not clean.     Hand Dominance        Extremity/Trunk Assessment   Upper Extremity Assessment Upper Extremity Assessment: Generalized weakness    Lower Extremity Assessment Lower Extremity Assessment: Generalized weakness (no focal weakness)       Communication   Communication: No difficulties  Cognition Arousal/Alertness: Awake/alert Behavior During Therapy: WFL for tasks assessed/performed Overall Cognitive Status: Within Functional Limits for tasks assessed                                 General Comments: A&Ox4        General Comments      Exercises     Assessment/Plan    PT Assessment Patient needs continued PT services  PT Problem List Decreased strength;Decreased mobility;Decreased safety awareness;Decreased activity tolerance;Decreased balance       PT Treatment Interventions Therapeutic activities;DME instruction;Gait training;Stair training;Balance training;Functional mobility training;Therapeutic exercise;Patient/family education    PT Goals (Current goals can be found in the Care Plan section)  Acute Rehab PT Goals Patient Stated Goal: to go home PT Goal Formulation: With patient Time For Goal Achievement: 09/23/21 Potential to Achieve Goals: Good    Frequency Min 2X/week    Barriers to discharge        Co-evaluation               AM-PAC PT "6 Clicks" Mobility  Outcome Measure Help needed turning from your back to your side while in a flat bed without using bedrails?: None Help needed moving from lying on your back to sitting on the side of a flat bed without using bedrails?: None Help needed moving to and from a bed to a chair (including a wheelchair)?: A Little Help needed standing up from a chair using your arms (e.g., wheelchair or bedside chair)?: A Little Help needed to walk in hospital room?: A Little Help needed climbing 3-5 steps with a railing? : A Little 6 Click Score: 20    End of Session Equipment Utilized During Treatment: Gait belt Activity Tolerance: Patient tolerated treatment well Patient left: in bed;with call bell/phone within reach;with bed alarm set   PT Visit Diagnosis: Unsteadiness on feet (R26.81);Other abnormalities of gait and mobility (R26.89);Muscle weakness (generalized) (M62.81)    Time: 4097-3532 PT Time Calculation (min) (ACUTE ONLY): 20 min   Charges:   PT Evaluation $PT Eval Moderate Complexity: 1 Mod PT Treatments $Therapeutic Activity: 8-22 mins        Patrina Levering PT, DPT 09/09/21 1:20 PM [phone]

## 2021-09-09 NOTE — Progress Notes (Signed)
PROGRESS NOTE    Nathan [name]  NOM:[id number] DOB: 10-Nov-1960 DOA: 09/03/2021 PCP: Derinda Late, MD    Brief Narrative:  60 y.o. male with medical history significant for type 2 diabetes mellitus, hypertension, paroxysmal atrial fibrillation(he stopped taking Eliquis on his own due to bleeding), combined systolic and diastolic CHF and stage III chronic kidney disease, who presented to the ER with acute onset of bleeding from his left calf wound with cellulitis.  He's now undergone angioplasty by vascular notable for calcific PAD, but no focal stenosis.  His cellulitis and wound appear to be gradually improving with therapy.  Plan is for discharge on abx and outpatient wound center follow up.  His hospitalization was complicated by AKI on CKD and he was seen by nephrology and plan is to start PD.  Surgery placed PD catheter 12/20.  Possible discharge in AM with outpatient nephrology, surgery, and wound care follow up pending stability and therapy recs.  12/21 WBC up today  Consultants:  Surgery, vascular, nephrology  Procedures:   Antimicrobials:  Cefazolin   Subjective: Has pain where he received heparin subcutaneous abdomen.  He reports feeling swollen and asking about his diuretics.    Objective: Vitals:   09/08/21 1947 09/09/21 0310 09/09/21 0758 09/09/21 1206  BP: (!) 105/57 (!) 109/56 139/69 129/75  Pulse: 61 63 66 62  Resp: 15 14 17 17   Temp: 98.2 F (36.8 C) 98.5 F (36.9 C) 97.9 F (36.6 C) 97.6 F (36.4 C)  TempSrc:      SpO2: 97% 94% 98% 98%  Weight:      Height:        Intake/Output Summary (Last 24 hours) at 09/09/2021 1229 Last data filed at 09/08/2021 1500 Gross per 24 hour  Intake 545.59 ml  Output --  Net 545.59 ml   Filed Weights   09/03/21 1824  Weight: 65.8 kg    Examination:  General exam: Appears calm and comfortable  Respiratory system: Clear to auscultation. Respiratory effort normal. Cardiovascular system: S1 & S2 heard, RRR.  No JVD, murmurs, rubs, gallops or clicks.  Gastrointestinal system: Abdomen is nondistended, soft and nontender.. Normal bowel sounds heard. Central nervous system: Alert and oriented.  Grossly intact Extremities: open wound, mild erythema surrounding, mostly medial aspect. Mild edema  LE. No warmth Psychiatry: Judgement and insight appear normal. Mood & affect appropriate.     Data Reviewed: I have personally reviewed following labs and imaging studies  CBC: Recent Labs  Lab 09/05/21 0813 09/05/21 1603 09/06/21 0335 09/07/21 0322 09/07/21 1516 09/08/21 0358 09/08/21 2234 09/09/21 0317  WBC 6.8  --  6.6 6.3  --  7.0  --  13.0*  NEUTROABS 5.4  --  5.2 5.1  --  5.2  --  11.4*  HGB 7.3*   < > 7.4* 7.1* 7.7* 7.5* 8.2* 8.0*  HCT 22.4*   < > 22.8* 21.7* 23.4* 23.5* 26.1* 25.0*  MCV 92.6  --  91.2 91.2  --  92.9  --  95.8  PLT 333  --  338 323  --  343  --  334   < > = values in this interval not displayed.   Basic Metabolic Panel: Recent Labs  Lab 09/05/21 0813 09/06/21 0335 09/07/21 0322 09/08/21 0358 09/09/21 0317  NA 141 138 140 141 140  K 4.2 4.3 4.4 4.3 4.5  CL 112* 111 113* 114* 115*  CO2 19* 18* 19* 18* 17*  GLUCOSE 103* 90 98 157* 153*  BUN 107*  101* 118* 117* 102*  CREATININE 5.41* 5.28* 5.49* 5.09* 5.20*  CALCIUM 7.5* 8.1* 7.9* 7.7* 7.6*  MG  --  2.6* 2.6* 2.6* 2.5*  PHOS  --  6.2* 6.9* 6.8* 6.9*   GFR: Estimated Creatinine Clearance: 14.4 mL/min (A) (by C-G formula based on SCr of 5.2 mg/dL (H)). Liver Function Tests: Recent Labs  Lab 09/05/21 0813 09/06/21 0335 09/07/21 0322 09/08/21 0358 09/09/21 0317  AST 19 18 17 15 15   ALT 18 17 15 15 14   ALKPHOS 197* 191* 194* 196* 175*  BILITOT 0.6 0.7 0.7 0.7 0.7  PROT 4.7* 5.0* 4.6* 4.8* 4.7*  ALBUMIN 2.0* 2.2* 2.1* 2.2* 2.2*   No results for input(s): LIPASE, AMYLASE in the last 168 hours. No results for input(s): AMMONIA in the last 168 hours. Coagulation Profile: No results for input(s): INR,  PROTIME in the last 168 hours. Cardiac Enzymes: No results for input(s): CKTOTAL, CKMB, CKMBINDEX, TROPONINI in the last 168 hours. BNP (last 3 results) No results for input(s): PROBNP in the last 8760 hours. HbA1C: No results for input(s): HGBA1C in the last 72 hours. CBG: Recent Labs  Lab 09/08/21 1242 09/08/21 1612 09/08/21 2118 09/09/21 0757 09/09/21 1205  GLUCAP 144* 145* 177* 130* 153*   Lipid Profile: No results for input(s): CHOL, HDL, LDLCALC, TRIG, CHOLHDL, LDLDIRECT in the last 72 hours. Thyroid Function Tests: No results for input(s): TSH, T4TOTAL, FREET4, T3FREE, THYROIDAB in the last 72 hours. Anemia Panel: No results for input(s): VITAMINB12, FOLATE, FERRITIN, TIBC, IRON, RETICCTPCT in the last 72 hours. Sepsis Labs: No results for input(s): PROCALCITON, LATICACIDVEN in the last 168 hours.  Recent Results (from the past 240 hour(s))  Resp Panel by RT-PCR (Flu A&B, Covid) Nasopharyngeal Swab     Status: None   Collection Time: 09/03/21  8:30 PM   Specimen: Nasopharyngeal Swab; Nasopharyngeal(NP) swabs in vial transport medium  Result Value Ref Range Status   SARS Coronavirus 2 by RT PCR NEGATIVE NEGATIVE Final    Comment: (NOTE) SARS-CoV-2 target nucleic acids are NOT DETECTED.  The SARS-CoV-2 RNA is generally detectable in upper respiratory specimens during the acute phase of infection. The lowest concentration of SARS-CoV-2 viral copies this assay can detect is 138 copies/mL. A negative result does not preclude SARS-Cov-2 infection and should not be used as the sole basis for treatment or other patient management decisions. A negative result may occur with  improper specimen collection/handling, submission of specimen other than nasopharyngeal swab, presence of viral mutation(s) within the areas targeted by this assay, and inadequate number of viral copies(<138 copies/mL). A negative result must be combined with clinical observations, patient history, and  epidemiological information. The expected result is Negative.  Fact Sheet for Patients:  EntrepreneurPulse.com.au  Fact Sheet for Healthcare Providers:  IncredibleEmployment.be  This test is no t yet approved or cleared by the [place] FDA and  has been authorized for detection and/or diagnosis of SARS-CoV-2 by FDA under an Emergency Use Authorization (EUA). This EUA will remain  in effect (meaning this test can be used) for the duration of the COVID-19 declaration under Section 564(b)(1) of the Act, 21 U.S.C.section 360bbb-3(b)(1), unless the authorization is terminated  or revoked sooner.       Influenza A by PCR NEGATIVE NEGATIVE Final   Influenza B by PCR NEGATIVE NEGATIVE Final    Comment: (NOTE) The Xpert Xpress SARS-CoV-2/FLU/RSV plus assay is intended as an aid in the diagnosis of influenza from Nasopharyngeal swab specimens and should not be used  as a sole basis for treatment. Nasal washings and aspirates are unacceptable for Xpert Xpress SARS-CoV-2/FLU/RSV testing.  Fact Sheet for Patients: EntrepreneurPulse.com.au  Fact Sheet for Healthcare Providers: IncredibleEmployment.be  This test is not yet approved or cleared by the [place] FDA and has been authorized for detection and/or diagnosis of SARS-CoV-2 by FDA under an Emergency Use Authorization (EUA). This EUA will remain in effect (meaning this test can be used) for the duration of the COVID-19 declaration under Section 564(b)(1) of the Act, 21 U.S.C. section 360bbb-3(b)(1), unless the authorization is terminated or revoked.  Performed at [facility], [address] [place], [place] [address]   MRSA Next Gen by PCR, Nasal     Status: None   Collection Time: 09/04/21 12:39 PM   Specimen: Nasal Mucosa; Nasal Swab  Result Value Ref Range Status   MRSA by PCR Next Gen NOT DETECTED NOT DETECTED Final    Comment:  (NOTE) The GeneXpert MRSA Assay (FDA approved for NASAL specimens only), is one component of a comprehensive MRSA colonization surveillance program. It is not intended to diagnose MRSA infection nor to guide or monitor treatment for MRSA infections. Test performance is not FDA approved in patients less than 64 years old. Performed at [facility], [address] [place], [place] [address]          Radiology Studies: DG Chest 1 View  Result Date: 09/07/2021 CLINICAL DATA:  Kidney damage, history diabetes mellitus, hypertension, CHF, paroxysmal atrial fibrillation EXAM: CHEST  1 VIEW COMPARISON:  Portable exam 1356 hours compared to 03/12/2021 FINDINGS: Enlargement of cardiac silhouette. Mediastinal contours and pulmonary vascularity normal. Atherosclerotic calcification at aortic arch. Bronchitic changes with LEFT lower lobe consolidation consistent with pneumonia. Mild atelectasis versus infiltrate at medial RIGHT lung base as well. Tiny LEFT pleural effusion. No pneumothorax or acute osseous findings. IMPRESSION: LEFT lower lobe and question mild medial RIGHT lower lobe infiltrates consistent pneumonia. Tiny LEFT pleural effusion, decreased. Enlargement of cardiac silhouette. Aortic Atherosclerosis (ICD10-I70.0). Electronically Signed   By: Lavonia Dana M.D.   On: 09/07/2021 15:31   PERIPHERAL VASCULAR CATHETERIZATION  Result Date: 09/07/2021 See surgical note for result.  DG Chest Port 1 View  Result Date: 09/09/2021 CLINICAL DATA:  Abnormal chest radiograph, follow-up; history diabetes mellitus, hypertension, CHF, atrial fibrillation EXAM: PORTABLE CHEST 1 VIEW COMPARISON:  Portable exam 0704 hours compared to 09/07/2021 FINDINGS: Enlargement of cardiac silhouette. Mediastinal contours and pulmonary vascularity normal. Persistent LEFT lower lobe consolidation. Minimal RIGHT basilar atelectasis. No pleural effusion or pneumothorax. Osseous structures unremarkable. IMPRESSION:  Enlargement of cardiac silhouette with mild RIGHT basilar atelectasis. Persistent LEFT lower lobe consolidation consistent with pneumonia. Follow-up radiographs recommended until resolution to exclude underlying abnormalities. Electronically Signed   By: Lavonia Dana M.D.   On: 09/09/2021 09:58        Scheduled Meds:  atorvastatin  40 mg Oral QHS   carvedilol  25 mg Oral BID WC   Chlorhexidine Gluconate Cloth  6 each Topical Q0600   ferrous sulfate  325 mg Oral Q breakfast   heparin injection (subcutaneous)  5,000 Units Subcutaneous Q8H   hydrALAZINE  50 mg Oral TID   insulin aspart  0-15 Units Subcutaneous Q4H   isosorbide mononitrate  30 mg Oral BID   multivitamin with minerals  1 tablet Oral Daily   Continuous Infusions:  sodium chloride 10 mL/hr at 09/07/21 0350   sodium chloride 75 mL/hr at 09/08/21 1845    ceFAZolin (ANCEF) IV 1 g (  09/09/21 0500)    Assessment & Plan:   Principal Problem:   Left leg cellulitis Active Problems:   Essential hypertension   Atrial fibrillation, chronic (HCC)   Acute on chronic HFrEF (heart failure with reduced ejection fraction) (HCC)   Acute kidney injury superimposed on CKD ([place])   Diabetes mellitus with diabetic neuropathy ([place])   Hyperlipidemia   Non-healing wound of left lower extremity   Acute blood loss anemia   Abnormal CXR   Lt leg cellulitis Continue on ancef. Improving slowly. S/p aortogram -focal stenosis creating fluid limitation Follow-up with [facility] wound care center  2.Non healing wound LLE F/u as outpt with wound care center S/p angiogram  3.leukocytosis Repeat cxr with persistent left lower lobe consolidation-pna Likely this is the cause Will switch cefazolin to ceftriaxone after discussing with ID pharmacy  to cover for cellulitis and pna.  3. Acute blood loss anemia Chronic but likely worsened recently with bleeding from wound H/h stable  4.  AKI superimposed on CKD Requiring dialysis Spoke to Dr. Zollie Scale  via chat he cleared patient to resume his Lasix 80 mg daily Status post PD catheter on 12/20 PD urgent start request from [place].A/C systolic HF Continue Coreg, imdur Resume lasix 80mg  qd  6. Chronic afib  Pt refuses to restart on eliquis and reports was not taking it at home -confirmed with him today  7.DM with diabetic neuropahty A1c 5.6 Riss  8. DL On statin  9. Ess. Htn Stable Continue coreg,imdur., hydralazine    DVT prophylaxis: heparin Code Status:full Family Communication: none at bedisde Disposition Plan:  Status is: Inpatient  Remains inpatient appropriate because: iv treatment            LOS: 6 days   Time spent: 45 min with >50% on coc    Nolberto Hanlon, MD [facility] Pager [phone]  If 7PM-7AM, please contact night-coverage 09/09/2021, 12:29 PM

## 2021-09-10 LAB — CBC
HCT: 24.9 % — ABNORMAL LOW (ref 39.0–52.0)
Hemoglobin: 7.9 g/dL — ABNORMAL LOW (ref 13.0–17.0)
MCH: 30.4 pg (ref 26.0–34.0)
MCHC: 31.7 g/dL (ref 30.0–36.0)
MCV: 95.8 fL (ref 80.0–100.0)
Platelets: 332 10*3/uL (ref 150–400)
RBC: 2.6 MIL/uL — ABNORMAL LOW (ref 4.22–5.81)
RDW: 17.9 % — ABNORMAL HIGH (ref 11.5–15.5)
WBC: 10 10*3/uL (ref 4.0–10.5)
nRBC: 0.9 % — ABNORMAL HIGH (ref 0.0–0.2)

## 2021-09-10 LAB — GLUCOSE, CAPILLARY
Glucose-Capillary: 114 mg/dL — ABNORMAL HIGH (ref 70–99)
Glucose-Capillary: 117 mg/dL — ABNORMAL HIGH (ref 70–99)

## 2021-09-10 LAB — BASIC METABOLIC PANEL
Anion gap: 10 (ref 5–15)
BUN: 106 mg/dL — ABNORMAL HIGH (ref 6–20)
CO2: 18 mmol/L — ABNORMAL LOW (ref 22–32)
Calcium: 8.1 mg/dL — ABNORMAL LOW (ref 8.9–10.3)
Chloride: 114 mmol/L — ABNORMAL HIGH (ref 98–111)
Creatinine, Ser: 5.89 mg/dL — ABNORMAL HIGH (ref 0.61–1.24)
GFR, Estimated: 10 mL/min — ABNORMAL LOW (ref >60)
Glucose, Bld: 120 mg/dL — ABNORMAL HIGH (ref 70–99)
Potassium: 4.2 mmol/L (ref 3.5–5.1)
Sodium: 142 mmol/L (ref 135–145)

## 2021-09-10 MED ORDER — AMOXICILLIN-POT CLAVULANATE 500-125 MG PO TABS
1.0000 | ORAL_TABLET | Freq: Two times a day (BID) | ORAL | Status: DC
  Administered 2021-09-10: 16:00:00 500 mg via ORAL
  Filled 2021-09-10 (×2): qty 1

## 2021-09-10 MED ORDER — AMOXICILLIN-POT CLAVULANATE 500-125 MG PO TABS: 1.0000 | ORAL_TABLET | Freq: Two times a day (BID) | ORAL | 0 refills | Status: AC

## 2021-09-10 MED ORDER — FUROSEMIDE 80 MG PO TABS: 80.0000 mg | ORAL_TABLET | Freq: Every day | ORAL | 0 refills | Status: DC

## 2021-09-10 MED ORDER — HYDRALAZINE HCL 50 MG PO TABS: 50.0000 mg | ORAL_TABLET | Freq: Three times a day (TID) | ORAL | 0 refills | Status: DC

## 2021-09-10 MED ORDER — CARVEDILOL 25 MG PO TABS: 25.0000 mg | ORAL_TABLET | Freq: Two times a day (BID) | ORAL | 0 refills | Status: DC

## 2021-09-10 MED ORDER — ATORVASTATIN CALCIUM 40 MG PO TABS: 40.0000 mg | ORAL_TABLET | Freq: Every day | ORAL | 0 refills | Status: DC

## 2021-09-10 NOTE — TOC Progression Note (Signed)
Transition of Care [facility]) - Progression Note    Patient Details  Name: Nathan [name] MRN: [id number] Date of Birth: Dec 06, 1960  Transition [facility] Care [facility]) CM/SW [place], RN Phone Number: 09/10/2021, 12:22 PM  Clinical Narrative:   Met with the patient in the room to discuss DC plan and needs He refuses [facility] services and stated that he doe snot need any DME, he has a walker at home    Expected Discharge Plan: Home/Self Care Barriers to Discharge: Continued Medical Work up  Expected Discharge Plan and Services Expected Discharge Plan: Home/Self Care   Discharge Planning Services: CM Consult   Living arrangements for the past 2 months: Single Family Home                                       Social Determinants of Health (SDOH) Interventions    Readmission Risk Interventions No flowsheet data found.

## 2021-09-10 NOTE — Progress Notes (Signed)
Working on setting up Urgent start Outpatient Peritoneal Dialysis treatments. Barrier for placement is that patient is uninsured. Financial verification will take a few days.

## 2021-09-10 NOTE — Progress Notes (Signed)
[facility]  ROUNDING NOTE   Subjective:   Nathan [name] is a 60 year old male with past medical conditions including diabetes, paroxysmal A. fib, diabetes, systolic and diastolic chronic heart failure, hypertension, and chronic kidney disease stage IV.  Patient reports to the emergency department with complaints of bleeding from his left calf wound.  Patient has been admitted for Hyperglycemia [R73.9] Cellulitis of left lower extremity [L03.116] AKI (acute kidney injury) ([place]) [N17.9] Left leg cellulitis [Q30.092]  Patient is known to our office and is followed Dr. Lanora Manis.  He was last seen in the office March 25, 2021 for routine follow-up.    Update: Patient sitting up in bed Complains of soreness from PD catheter site    Objective:  Vital signs in last 24 hours:  Temp:  [97.6 F (36.4 C)-98.3 F (36.8 C)] 98 F (36.7 C) (12/22 0725) Pulse Rate:  [62-70] 70 (12/22 0725) Resp:  [16-20] 18 (12/22 0725) BP: (126-157)/(67-78) 153/78 (12/22 0725) SpO2:  [96 %-98 %] 97 % (12/22 0725)  Weight change:  Filed Weights   09/03/21 1824  Weight: 65.8 kg    Intake/Output: I/O last 3 completed shifts: In: 120 [P.O.:120] Out: 200 [Urine:200]   Intake/Output this shift:  Total I/O In: 503.4 [I.V.:403.4; IV Piggyback:100] Out: -   Physical Exam: General: NAD, resting in bed  Head: Normocephalic, atraumatic. Moist oral mucosal membranes  Eyes: Anicteric  Lungs:  Clear to auscultation, normal effort, room air  Heart: Regular rate and rhythm  Abdomen:  Soft, tender, nondistended, PD catheter LUQ-surgical dressing with old bloody drainage  Extremities: trace peripheral edema.  Neurologic: Nonfocal, moving all four extremities  Skin: LLE wound, ABD lap sites, surgi-glue       Basic Metabolic Panel: Recent Labs  Lab 09/05/21 0813 09/06/21 0335 09/07/21 0322 09/08/21 0358 09/09/21 0317  NA 141 138 140 141 140  K 4.2 4.3 4.4 4.3 4.5  CL 112* 111 113*  114* 115*  CO2 19* 18* 19* 18* 17*  GLUCOSE 103* 90 98 157* 153*  BUN 107* 101* 118* 117* 102*  CREATININE 5.41* 5.28* 5.49* 5.09* 5.20*  CALCIUM 7.5* 8.1* 7.9* 7.7* 7.6*  MG  --  2.6* 2.6* 2.6* 2.5*  PHOS  --  6.2* 6.9* 6.8* 6.9*     Liver Function Tests: Recent Labs  Lab 09/05/21 0813 09/06/21 0335 09/07/21 0322 09/08/21 0358 09/09/21 0317  AST 19 18 17 15 15   ALT 18 17 15 15 14   ALKPHOS 197* 191* 194* 196* 175*  BILITOT 0.6 0.7 0.7 0.7 0.7  PROT 4.7* 5.0* 4.6* 4.8* 4.7*  ALBUMIN 2.0* 2.2* 2.1* 2.2* 2.2*    No results for input(s): LIPASE, AMYLASE in the last 168 hours. No results for input(s): AMMONIA in the last 168 hours.  CBC: Recent Labs  Lab 09/05/21 0813 09/05/21 1603 09/06/21 0335 09/07/21 0322 09/07/21 1516 09/08/21 0358 09/08/21 2234 09/09/21 0317  WBC 6.8  --  6.6 6.3  --  7.0  --  13.0*  NEUTROABS 5.4  --  5.2 5.1  --  5.2  --  11.4*  HGB 7.3*   < > 7.4* 7.1* 7.7* 7.5* 8.2* 8.0*  HCT 22.4*   < > 22.8* 21.7* 23.4* 23.5* 26.1* 25.0*  MCV 92.6  --  91.2 91.2  --  92.9  --  95.8  PLT 333  --  338 323  --  343  --  334   < > = values in this interval not displayed.  Cardiac Enzymes: No results for input(s): CKTOTAL, CKMB, CKMBINDEX, TROPONINI in the last 168 hours.  BNP: Invalid input(s): POCBNP  CBG: Recent Labs  Lab 09/09/21 0757 09/09/21 1205 09/09/21 1709 09/09/21 2055 09/10/21 0740  GLUCAP 130* 153* 178* 175* 114*     Microbiology: Results for orders placed or performed during the hospital encounter of 09/03/21  Resp Panel by RT-PCR (Flu A&B, Covid) Nasopharyngeal Swab     Status: None   Collection Time: 09/03/21  8:30 PM   Specimen: Nasopharyngeal Swab; Nasopharyngeal(NP) swabs in vial transport medium  Result Value Ref Range Status   SARS Coronavirus 2 by RT PCR NEGATIVE NEGATIVE Final    Comment: (NOTE) SARS-CoV-2 target nucleic acids are NOT DETECTED.  The SARS-CoV-2 RNA is generally detectable in upper  respiratory specimens during the acute phase of infection. The lowest concentration of SARS-CoV-2 viral copies this assay can detect is 138 copies/mL. A negative result does not preclude SARS-Cov-2 infection and should not be used as the sole basis for treatment or other patient management decisions. A negative result may occur with  improper specimen collection/handling, submission of specimen other than nasopharyngeal swab, presence of viral mutation(s) within the areas targeted by this assay, and inadequate number of viral copies(<138 copies/mL). A negative result must be combined with clinical observations, patient history, and epidemiological information. The expected result is Negative.  Fact Sheet for Patients:  EntrepreneurPulse.com.au  Fact Sheet for Healthcare Providers:  IncredibleEmployment.be  This test is no t yet approved or cleared by the [place] FDA and  has been authorized for detection and/or diagnosis of SARS-CoV-2 by FDA under an Emergency Use Authorization (EUA). This EUA will remain  in effect (meaning this test can be used) for the duration of the COVID-19 declaration under Section 564(b)(1) of the Act, 21 U.S.C.section 360bbb-3(b)(1), unless the authorization is terminated  or revoked sooner.       Influenza A by PCR NEGATIVE NEGATIVE Final   Influenza B by PCR NEGATIVE NEGATIVE Final    Comment: (NOTE) The Xpert Xpress SARS-CoV-2/FLU/RSV plus assay is intended as an aid in the diagnosis of influenza from Nasopharyngeal swab specimens and should not be used as a sole basis for treatment. Nasal washings and aspirates are unacceptable for Xpert Xpress SARS-CoV-2/FLU/RSV testing.  Fact Sheet for Patients: EntrepreneurPulse.com.au  Fact Sheet for Healthcare Providers: IncredibleEmployment.be  This test is not yet approved or cleared by the [place] FDA and has been  authorized for detection and/or diagnosis of SARS-CoV-2 by FDA under an Emergency Use Authorization (EUA). This EUA will remain in effect (meaning this test can be used) for the duration of the COVID-19 declaration under Section 564(b)(1) of the Act, 21 U.S.C. section 360bbb-3(b)(1), unless the authorization is terminated or revoked.  Performed at [facility], [address] [place], [place] [address]   MRSA Next Gen by PCR, Nasal     Status: None   Collection Time: 09/04/21 12:39 PM   Specimen: Nasal Mucosa; Nasal Swab  Result Value Ref Range Status   MRSA by PCR Next Gen NOT DETECTED NOT DETECTED Final    Comment: (NOTE) The GeneXpert MRSA Assay (FDA approved for NASAL specimens only), is one component of a comprehensive MRSA colonization surveillance program. It is not intended to diagnose MRSA infection nor to guide or monitor treatment for MRSA infections. Test performance is not FDA approved in patients less than 20 years old. Performed at [facility], [address] [place],  [address]  Coagulation Studies: No results for input(s): LABPROT, INR in the last 72 hours.  Urinalysis: No results for input(s): COLORURINE, LABSPEC, PHURINE, GLUCOSEU, HGBUR, BILIRUBINUR, KETONESUR, PROTEINUR, UROBILINOGEN, NITRITE, LEUKOCYTESUR in the last 72 hours.  Invalid input(s): APPERANCEUR     Imaging: DG Chest Port 1 View  Result Date: 09/09/2021 CLINICAL DATA:  Abnormal chest radiograph, follow-up; history diabetes mellitus, hypertension, CHF, atrial fibrillation EXAM: PORTABLE CHEST 1 VIEW COMPARISON:  Portable exam 0704 hours compared to 09/07/2021 FINDINGS: Enlargement of cardiac silhouette. Mediastinal contours and pulmonary vascularity normal. Persistent LEFT lower lobe consolidation. Minimal RIGHT basilar atelectasis. No pleural effusion or pneumothorax. Osseous structures unremarkable. IMPRESSION: Enlargement of cardiac silhouette with mild RIGHT  basilar atelectasis. Persistent LEFT lower lobe consolidation consistent with pneumonia. Follow-up radiographs recommended until resolution to exclude underlying abnormalities. Electronically Signed   By: Lavonia Dana M.D.   On: 09/09/2021 09:58     Medications:    sodium chloride 10 mL/hr at 09/07/21 0350   cefTRIAXone (ROCEPHIN)  IV Stopped (09/09/21 1804)    atorvastatin  40 mg Oral QHS   carvedilol  25 mg Oral BID WC   Chlorhexidine Gluconate Cloth  6 each Topical Q0600   ferrous sulfate  325 mg Oral Q breakfast   furosemide  80 mg Oral Daily   heparin  5,000 Units Subcutaneous Q8H   hydrALAZINE  50 mg Oral TID   insulin aspart  0-15 Units Subcutaneous TID AC & HS   isosorbide mononitrate  30 mg Oral BID   multivitamin with minerals  1 tablet Oral Daily   sodium chloride, acetaminophen **OR** acetaminophen, HYDROmorphone (DILAUDID) injection, morphine injection, ondansetron **OR** ondansetron (ZOFRAN) IV, ondansetron (ZOFRAN) IV, oxyCODONE, phenol, traZODone  Assessment/ Plan:  Mr. Jquan Egelston Iannello is a 60 y.o.  male with past medical conditions including diabetes, paroxysmal A. fib, diabetes, systolic and diastolic chronic heart failure, hypertension, and chronic kidney disease stage IV.  Patient reports to the emergency department with complaints of bleeding from his left calf wound.  Patient has been admitted for Hyperglycemia [R73.9] Cellulitis of left lower extremity [L03.116] AKI (acute kidney injury) ([place]) [N17.9] Left leg cellulitis [L03.116]   End stage renal disease requiring dialysis.  Continue to hold diuretics Patient has been on aggressive diuresis outpatient, furosemide 80 mg twice daily and metolazone 2.5 mg twice daily.  Appreciate surgery placing PD catheter on 09/08/2021.  -Urgent start peritoneal dialysis arranged at [facility] on Tuesday   Lab Results  Component Value Date   CREATININE 5.20 (H) 09/09/2021   CREATININE 5.09 (H) 09/08/2021    CREATININE 5.49 (H) 09/07/2021    Intake/Output Summary (Last 24 hours) at 09/10/2021 1052 Last data filed at 09/10/2021 0915 Gross per 24 hour  Intake 623.4 ml  Output 200 ml  Net 423.4 ml    2. Anemia of chronic kidney disease Lab Results  Component Value Date   HGB 8.0 (L) 09/09/2021    SubQ EPO given this week  3. Secondary Hyperparathyroidism: with outpatient labs: phosphorus 5.5, calcium 7.8 on 03/25/21.   Lab Results  Component Value Date   CALCIUM 7.6 (L) 09/09/2021   CAION 1.05 (L) 06/26/2019   PHOS 6.9 (H) 09/09/2021    Corrected calcium within range. Phosphorous remains elevated  4.  Chronic systolic heart failure.  Echo from 03/14/2021 shows EF 40 to 45% with a grade 2 diastolic dysfunction and mild LVH.    -Furosemide 80mg  daily  5. LLE cellulitis, Wound culture obtained Vascular consulted for angiogram, concerned with  increased renal function.  Appreciate vascular performing angiogram of LLE yesterday.  Determined to have adequate perfusion for wound healing, no intervention necessary. Prescribed antibioics.   LOS: 7 Abraham Entwistle 12/22/202210:52 AM

## 2021-09-10 NOTE — Discharge Summary (Signed)
Nathan [name] WFU:[id number] DOB: 1961-09-06 DOA: 09/03/2021  PCP: Derinda Late, MD  Admit date: 09/03/2021 Discharge date: 09/10/2021  Admitted From: Home Disposition: Home Recommendations for Outpatient Follow-up:  Follow up with PCP in 1 week Please obtain BMP/CBC in one week Please follow up surgery in 3 weeks Follow-up with cardiology Follow-up with 2 weeks Follow-up with hemodialysis     Discharge Condition:Stable CODE STATUS: Full Diet recommendation: Renal diet    Brief/Interim Summary: Per [facility]:Nathan [name] is a 60 y.o. male with medical history significant for type 2 diabetes mellitus, hypertension, paroxysmal atrial fibrillation(he stopped taking Eliquis on his own due to bleeding), combined systolic and diastolic CHF and stage III chronic kidney disease, who presented to the ER with acute onset of bleeding from his left calf wound.  He has been having worsening erythema and and had a ruptured blister over his medial calf about a month ago.  He admitted to mild associated tenderness.  He has not been using any dressings.  He does not follow with any wound clinic.  No recent trauma ED Course: He came to the ER blood pressure was 174/88 with otherwise normal vital signs.  Labs revealed hyperglycemia of 229 and a BUN of 101 with a creatinine of 5.45 compared to 86 and 3.86 on 03/18/2021.  CBC showed anemia with hemoglobin of 7.4 hematocrit 22.7 slightly worse than 03/18/2021 when they were 8.5/26.5. X-ray of the left tibia and fibula reveal no acute osseous abnormalities.  His hospitalization was complicated by AKI on CKD and he was seen by nephrology and plan is to start PD.  Surgery placed PD catheter 12/20.   Lt leg cellulitis Was treated on IV antibiotic improvemeant  S/p aortogram by vascular surgery no-focal stenosis creating flow limitation Follow-up with [facility] wound care center   2.Non healing wound LLE F/u as outpt with wound care center S/p angiogram as  above   3.leukocytosis His white count started going up, repeat cxr with persistent left lower lobe consolidation-pna Was treated for possible pneumonia as well as cellulitis above    3. Acute blood loss anemia Chronic but likely worsened recently with bleeding from wound H/h stable Will need to follow-up with PCP as outpatient   4.  AKI superimposed on CKD Requiring dialysis His Lasix was decreased to Lasix 80 mg daily Status post PD catheter on 12/20 PD urgent start request from [place].A/C systolic HF Continue Imdur, Coreg and Lasix as above   6. Chronic afib  Pt refuses to restart on eliquis and reports was not taking it at home Has not been even taking it at home for several months   7.DM with diabetic neuropahty A1c 5.6    8. DL On statin   9.  Essential hypertension Continue home meds     Discharge Diagnoses:  Principal Problem:   Left leg cellulitis Active Problems:   Essential hypertension   Atrial fibrillation, chronic (HCC)   Acute on chronic HFrEF (heart failure with reduced ejection fraction) (HCC)   Acute kidney injury superimposed on CKD ([place])   Diabetes mellitus with diabetic neuropathy (HCC)   Hyperlipidemia   Non-healing wound of left lower extremity   Acute blood loss anemia   Abnormal CXR    Discharge Instructions  Discharge Instructions     Ambulatory referral to Wound Clinic   Complete by: As directed    Call MD for:  temperature >100.4   Complete by: As directed    Diet -  low sodium heart healthy   Complete by: As directed    Renal diet   Diet Carb Modified   Complete by: As directed    Discharge instructions   Complete by: As directed    Follow up with peritoneal dialysis arranged at [facility] on Tuesday   Increase activity slowly   Complete by: As directed    Leave dressing on - Keep it clean, dry, and intact until clinic visit   Complete by: As directed       Allergies as of 09/10/2021   No Known  Allergies      Medication List     STOP taking these medications    apixaban 5 MG Tabs tablet Commonly known as: Eliquis   metolazone 2.5 MG tablet Commonly known as: ZAROXOLYN       TAKE these medications    acetaminophen 500 MG tablet Commonly known as: TYLENOL Take 500-1,000 mg by mouth every 6 (six) hours as needed for mild pain or moderate pain.   amoxicillin-clavulanate 500-125 MG tablet Commonly known as: AUGMENTIN Take 1 tablet (500 mg total) by mouth 2 (two) times daily for 5 days.   aspirin EC 81 MG tablet Take 81 mg by mouth daily.   atorvastatin 40 MG tablet Commonly known as: LIPITOR Take 1 tablet (40 mg total) by mouth at bedtime. What changed: when to take this   carvedilol 25 MG tablet Commonly known as: COREG Take 1 tablet (25 mg total) by mouth 2 (two) times daily with a meal. What changed: when to take this   ferrous sulfate 325 (65 FE) MG tablet Take 1 tablet (325 mg total) by mouth daily with breakfast.   furosemide 80 MG tablet Commonly known as: LASIX Take 1 tablet (80 mg total) by mouth daily. Start taking on: September 11, 2021 What changed: when to take this   glipiZIDE 2.5 MG 24 hr tablet Commonly known as: GLUCOTROL XL Take 2.5 mg by mouth daily as needed (elevated blood glucose).   hydrALAZINE 50 MG tablet Commonly known as: APRESOLINE Take 1 tablet (50 mg total) by mouth 3 (three) times daily.   isosorbide mononitrate 30 MG 24 hr tablet Commonly known as: IMDUR Take 1 tablet (30 mg total) by mouth 2 (two) times daily.   multivitamin tablet Take 1 tablet by mouth daily.               Discharge Care Instructions  (From admission, onward)           Start     Ordered   09/10/21 0000  Leave dressing on - Keep it clean, dry, and intact until clinic visit        09/10/21 1331            Follow-up Information     Derinda Late, MD. Schedule an appointment as soon as possible for a visit .   Specialty:  Family Medicine Why: For wound re-check Contact information: [address] [facility] [place] [address] [phone]         Jules Husbands, MD Follow up in 3 week(s).   Specialty: General Surgery Contact information: [address] [place] [address] [phone]         [place]. Call.   Specialty: Wound Care Why: Patient will need to call and make appointment so they can be sure date/time works for him. Contact information: [place]  [address] [id number] ar [phone]        Derinda Late, MD .   Specialty: Family Medicine Contact information: [address] [facility] [place] [address] [phone]         Minna Merritts, MD .   Specialty: Cardiology Contact information: [address] [place] [address] [phone]         [facility] Follow up.   Why: Patient to come to [facility] on 09/15/21 for Urgent Start PD Contact information: [address] [place] [address] [phone]         Algernon Huxley, MD Follow up in 2 week(s).   Specialties: Vascular Surgery, Radiology, Interventional Cardiology Contact information: [place] [address] [phone]                No Known Allergies  Consultations:  Nephrology, vascular, surgery  Procedures/Studies: DG Chest 1 View  Result Date: 09/07/2021 CLINICAL DATA:  Kidney damage, history diabetes mellitus, hypertension, CHF, paroxysmal atrial fibrillation EXAM: CHEST  1 VIEW COMPARISON:  Portable exam 1356 hours compared to 03/12/2021 FINDINGS: Enlargement of cardiac silhouette. Mediastinal contours and pulmonary vascularity normal. Atherosclerotic calcification at aortic arch. Bronchitic changes with LEFT lower lobe consolidation consistent with pneumonia. Mild  atelectasis versus infiltrate at medial RIGHT lung base as well. Tiny LEFT pleural effusion. No pneumothorax or acute osseous findings. IMPRESSION: LEFT lower lobe and question mild medial RIGHT lower lobe infiltrates consistent pneumonia. Tiny LEFT pleural effusion, decreased. Enlargement of cardiac silhouette. Aortic Atherosclerosis (ICD10-I70.0). Electronically Signed   By: Lavonia Dana M.D.   On: 09/07/2021 15:31   DG Tibia/Fibula Left  Result Date: 09/03/2021 CLINICAL DATA:  Weeping of lower leg, bleeding wound at heel, blisters EXAM: LEFT TIBIA AND FIBULA - 2 VIEW COMPARISON:  None FINDINGS: Osseous demineralization. Knee and ankle joint alignments normal. No acute fracture, dislocation, or bone destruction. Diffuse soft tissue swelling greatest at ankle. Scattered atherosclerotic calcifications within runoff vessels. IMPRESSION: No acute osseous abnormalities. Electronically Signed   By: Lavonia Dana M.D.   On: 09/03/2021 19:12   US Renal  Result Date: 09/03/2021 CLINICAL DATA:  Acute on chronic kidney disease EXAM: RENAL / URINARY TRACT ULTRASOUND COMPLETE COMPARISON:  12/03/2020 FINDINGS: Right Kidney: Renal measurements: 11.6 x 7.0 x 6.3 cm = volume: 271 mL. Echogenic renal parenchyma. Trace perinephric fluid. No mass or hydronephrosis. Left Kidney: Renal measurements: 10.7 x 6.5 x 5.5 cm = volume: 199 mL. Echogenic renal parenchyma. Trace perinephric fluid. No mass or hydronephrosis. Bladder: Appears normal for degree of bladder distention. Other: Small right pleural effusion. IMPRESSION: Echogenic renal parenchyma, suggesting medical renal disease. No hydronephrosis. Electronically Signed   By: Julian Hy M.D.   On: 09/03/2021 20:49   PERIPHERAL VASCULAR CATHETERIZATION  Result Date: 09/07/2021 See surgical note for result.  US Venous Img Lower Bilateral (DVT)  Result Date: 09/04/2021 CLINICAL DATA:  Bilateral lower extremity edema.  Evaluate for DVT. EXAM: BILATERAL LOWER  EXTREMITY VENOUS DOPPLER ULTRASOUND TECHNIQUE: Gray-scale sonography with graded compression, as well as color Doppler and duplex ultrasound were performed to evaluate the lower extremity deep venous systems from the level of the common femoral vein and including the common femoral, femoral, profunda femoral, popliteal and calf veins including the posterior tibial, peroneal and gastrocnemius veins when visible. The superficial great saphenous vein was also interrogated. Spectral Doppler was utilized to evaluate flow at rest and with distal augmentation  maneuvers in the common femoral, femoral and popliteal veins. COMPARISON:  None. FINDINGS: RIGHT LOWER EXTREMITY Common Femoral Vein: No evidence of thrombus. Normal compressibility, respiratory phasicity and response to augmentation. Saphenofemoral Junction: No evidence of thrombus. Normal compressibility and flow on color Doppler imaging. Profunda Femoral Vein: No evidence of thrombus. Normal compressibility and flow on color Doppler imaging. Femoral Vein: No evidence of thrombus. Normal compressibility, respiratory phasicity and response to augmentation. Popliteal Vein: No evidence of thrombus. Normal compressibility, respiratory phasicity and response to augmentation. Calf Veins: No evidence of thrombus. Normal compressibility and flow on color Doppler imaging. Superficial Great Saphenous Vein: No evidence of thrombus. Normal compressibility. Venous Reflux:  None. Other Findings: There is a moderate amount of subcutaneous edema at the level the right calf (image 10). LEFT LOWER EXTREMITY Common Femoral Vein: No evidence of thrombus. Normal compressibility, respiratory phasicity and response to augmentation. Saphenofemoral Junction: No evidence of thrombus. Normal compressibility and flow on color Doppler imaging. Profunda Femoral Vein: No evidence of thrombus. Normal compressibility and flow on color Doppler imaging. Femoral Vein: No evidence of thrombus. Normal  compressibility, respiratory phasicity and response to augmentation. Popliteal Vein: No evidence of thrombus. Normal compressibility, respiratory phasicity and response to augmentation. Calf Veins: No evidence of thrombus. Normal compressibility and flow on color Doppler imaging. Superficial Great Saphenous Vein: No evidence of thrombus. Normal compressibility. Venous Reflux:  None. Other Findings:  None. IMPRESSION: No evidence of DVT within either lower extremity. Electronically Signed   By: Sandi Mariscal M.D.   On: 09/04/2021 07:04   US ARTERIAL ABI (SCREENING LOWER EXTREMITY)  Result Date: 09/04/2021 CLINICAL DATA:  60 year old male with bilateral lower extremity edema. EXAM: NONINVASIVE PHYSIOLOGIC VASCULAR STUDY OF BILATERAL LOWER EXTREMITIES TECHNIQUE: Evaluation of both lower extremities were performed at rest, including calculation of ankle-brachial indices with single level Doppler, pressure and pulse volume recording. COMPARISON:  None. FINDINGS: Right ABI:  Noncompressible. Left ABI:  Noncompressible Right Lower Extremity:  Monophasic arterial waveforms at the ankle. Left Lower Extremity:  Monophasic arterial waveforms at the ankle. IMPRESSION: Noncompressible lower extremity arteries at the level of the ankle, unable to calculate ankle-brachial indices. Monophasic waveforms bilaterally suggest bilateral lower extremity peripheral artery disease. Consider lower extremity duplex, CTA runoff, or direct angiography for further evaluation. Ruthann Cancer, MD [facility] Electronically Signed   By: Ruthann Cancer M.D.   On: 09/04/2021 10:19   DG Chest Port 1 View  Result Date: 09/09/2021 CLINICAL DATA:  Abnormal chest radiograph, follow-up; history diabetes mellitus, hypertension, CHF, atrial fibrillation EXAM: PORTABLE CHEST 1 VIEW COMPARISON:  Portable exam 0704 hours compared to 09/07/2021 FINDINGS: Enlargement of cardiac silhouette.  Mediastinal contours and pulmonary vascularity normal. Persistent LEFT lower lobe consolidation. Minimal RIGHT basilar atelectasis. No pleural effusion or pneumothorax. Osseous structures unremarkable. IMPRESSION: Enlargement of cardiac silhouette with mild RIGHT basilar atelectasis. Persistent LEFT lower lobe consolidation consistent with pneumonia. Follow-up radiographs recommended until resolution to exclude underlying abnormalities. Electronically Signed   By: Lavonia Dana M.D.   On: 09/09/2021 09:58      Subjective:   Discharge Exam: Vitals:   09/10/21 0725 09/10/21 1125  BP: (!) 153/78 (!) 160/75  Pulse: 70 68  Resp: 18 18  Temp: 98 F (36.7 C) 98.2 F (36.8 C)  SpO2: 97% 93%   Vitals:   09/09/21 2005 09/10/21 0417 09/10/21 0725 09/10/21 1125  BP: 126/72 (!) 157/76 (!) 153/78 (!) 160/75  Pulse: 63 69 70 68  Resp: 20 20  18 18  Temp: 97.7 F (36.5 C) 98.3 F (36.8 C) 98 F (36.7 C) 98.2 F (36.8 C)  TempSrc:   Oral   SpO2: 96% 97% 97% 93%  Weight:      Height:        General: Pt is alert, awake, not in acute distress Cardiovascular: RRR, S1/S2 +, no rubs, no gallops Respiratory: CTA bilaterally, no wheezing, no rhonchi Abdominal: Soft, NT, ND, bowel sounds + Extremities: no edema    The results of significant diagnostics from this hospitalization (including imaging, microbiology, ancillary and laboratory) are listed below for reference.     Microbiology: Recent Results (from the past 240 hour(s))  Resp Panel by RT-PCR (Flu A&B, Covid) Nasopharyngeal Swab     Status: None   Collection Time: 09/03/21  8:30 PM   Specimen: Nasopharyngeal Swab; Nasopharyngeal(NP) swabs in vial transport medium  Result Value Ref Range Status   SARS Coronavirus 2 by RT PCR NEGATIVE NEGATIVE Final    Comment: (NOTE) SARS-CoV-2 target nucleic acids are NOT DETECTED.  The SARS-CoV-2 RNA is generally detectable in upper respiratory specimens during the acute phase of infection. The  lowest concentration of SARS-CoV-2 viral copies this assay can detect is 138 copies/mL. A negative result does not preclude SARS-Cov-2 infection and should not be used as the sole basis for treatment or other patient management decisions. A negative result may occur with  improper specimen collection/handling, submission of specimen other than nasopharyngeal swab, presence of viral mutation(s) within the areas targeted by this assay, and inadequate number of viral copies(<138 copies/mL). A negative result must be combined with clinical observations, patient history, and epidemiological information. The expected result is Negative.  Fact Sheet for Patients:  EntrepreneurPulse.com.au  Fact Sheet for Healthcare Providers:  IncredibleEmployment.be  This test is no t yet approved or cleared by the [place] FDA and  has been authorized for detection and/or diagnosis of SARS-CoV-2 by FDA under an Emergency Use Authorization (EUA). This EUA will remain  in effect (meaning this test can be used) for the duration of the COVID-19 declaration under Section 564(b)(1) of the Act, 21 U.S.C.section 360bbb-3(b)(1), unless the authorization is terminated  or revoked sooner.       Influenza A by PCR NEGATIVE NEGATIVE Final   Influenza B by PCR NEGATIVE NEGATIVE Final    Comment: (NOTE) The Xpert Xpress SARS-CoV-2/FLU/RSV plus assay is intended as an aid in the diagnosis of influenza from Nasopharyngeal swab specimens and should not be used as a sole basis for treatment. Nasal washings and aspirates are unacceptable for Xpert Xpress SARS-CoV-2/FLU/RSV testing.  Fact Sheet for Patients: EntrepreneurPulse.com.au  Fact Sheet for Healthcare Providers: IncredibleEmployment.be  This test is not yet approved or cleared by the [place] FDA and has been authorized for detection and/or diagnosis of SARS-CoV-2 by FDA under  an Emergency Use Authorization (EUA). This EUA will remain in effect (meaning this test can be used) for the duration of the COVID-19 declaration under Section 564(b)(1) of the Act, 21 U.S.C. section 360bbb-3(b)(1), unless the authorization is terminated or revoked.  Performed at [facility], [address] [place], [place] [address]   MRSA Next Gen by PCR, Nasal     Status: None   Collection Time: 09/04/21 12:39 PM   Specimen: Nasal Mucosa; Nasal Swab  Result Value Ref Range Status   MRSA by PCR Next Gen NOT DETECTED NOT DETECTED Final    Comment: (NOTE) The GeneXpert MRSA Assay (FDA approved for NASAL specimens  only), is one component of a comprehensive MRSA colonization surveillance program. It is not intended to diagnose MRSA infection nor to guide or monitor treatment for MRSA infections. Test performance is not FDA approved in patients less than 51 years old. Performed at [facility], [address] [place], [place] [address]      Labs: BNP (last 3 results) Recent Labs    03/12/21 0751  BNP 9,371.6*   Basic Metabolic Panel: Recent Labs  Lab 09/06/21 0335 09/07/21 0322 09/08/21 0358 09/09/21 0317 09/10/21 1054  NA 138 140 141 140 142  K 4.3 4.4 4.3 4.5 4.2  CL 111 113* 114* 115* 114*  CO2 18* 19* 18* 17* 18*  GLUCOSE 90 98 157* 153* 120*  BUN 101* 118* 117* 102* 106*  CREATININE 5.28* 5.49* 5.09* 5.20* 5.89*  CALCIUM 8.1* 7.9* 7.7* 7.6* 8.1*  MG 2.6* 2.6* 2.6* 2.5*  --   PHOS 6.2* 6.9* 6.8* 6.9*  --    Liver Function Tests: Recent Labs  Lab 09/05/21 0813 09/06/21 0335 09/07/21 0322 09/08/21 0358 09/09/21 0317  AST 19 18 17 15 15   ALT 18 17 15 15 14   ALKPHOS 197* 191* 194* 196* 175*  BILITOT 0.6 0.7 0.7 0.7 0.7  PROT 4.7* 5.0* 4.6* 4.8* 4.7*  ALBUMIN 2.0* 2.2* 2.1* 2.2* 2.2*   No results for input(s): LIPASE, AMYLASE in the last 168 hours. No results for input(s): AMMONIA in the last 168 hours. CBC: Recent Labs  Lab  09/05/21 0813 09/05/21 1603 09/06/21 0335 09/07/21 0322 09/07/21 1516 09/08/21 0358 09/08/21 2234 09/09/21 0317 09/10/21 1054  WBC 6.8  --  6.6 6.3  --  7.0  --  13.0* 10.0  NEUTROABS 5.4  --  5.2 5.1  --  5.2  --  11.4*  --   HGB 7.3*   < > 7.4* 7.1* 7.7* 7.5* 8.2* 8.0* 7.9*  HCT 22.4*   < > 22.8* 21.7* 23.4* 23.5* 26.1* 25.0* 24.9*  MCV 92.6  --  91.2 91.2  --  92.9  --  95.8 95.8  PLT 333  --  338 323  --  343  --  334 332   < > = values in this interval not displayed.   Cardiac Enzymes: No results for input(s): CKTOTAL, CKMB, CKMBINDEX, TROPONINI in the last 168 hours. BNP: Invalid input(s): POCBNP CBG: Recent Labs  Lab 09/09/21 1205 09/09/21 1709 09/09/21 2055 09/10/21 0740 09/10/21 1150  GLUCAP 153* 178* 175* 114* 117*   D-Dimer No results for input(s): DDIMER in the last 72 hours. Hgb A1c No results for input(s): HGBA1C in the last 72 hours. Lipid Profile No results for input(s): CHOL, HDL, LDLCALC, TRIG, CHOLHDL, LDLDIRECT in the last 72 hours. Thyroid function studies No results for input(s): TSH, T4TOTAL, T3FREE, THYROIDAB in the last 72 hours.  Invalid input(s): FREET3 Anemia work up No results for input(s): VITAMINB12, FOLATE, FERRITIN, TIBC, IRON, RETICCTPCT in the last 72 hours. Urinalysis    Component Value Date/Time   COLORURINE YELLOW 09/03/2021 0723   APPEARANCEUR CLEAR 09/03/2021 0723   LABSPEC 1.020 09/03/2021 0723   PHURINE 5.0 09/03/2021 0723   GLUCOSEU 250 (A) 09/03/2021 0723   HGBUR TRACE (A) 09/03/2021 0723   BILIRUBINUR NEGATIVE 09/03/2021 0723   KETONESUR NEGATIVE 09/03/2021 0723   PROTEINUR >300 (A) 09/03/2021 0723   NITRITE NEGATIVE 09/03/2021 0723   LEUKOCYTESUR NEGATIVE 09/03/2021 0723   Sepsis Labs Invalid input(s): PROCALCITONIN,  WBC,  LACTICIDVEN Microbiology Recent Results (from the past 240  hour(s))  Resp Panel by RT-PCR (Flu A&B, Covid) Nasopharyngeal Swab     Status: None   Collection Time: 09/03/21  8:30 PM    Specimen: Nasopharyngeal Swab; Nasopharyngeal(NP) swabs in vial transport medium  Result Value Ref Range Status   SARS Coronavirus 2 by RT PCR NEGATIVE NEGATIVE Final    Comment: (NOTE) SARS-CoV-2 target nucleic acids are NOT DETECTED.  The SARS-CoV-2 RNA is generally detectable in upper respiratory specimens during the acute phase of infection. The lowest concentration of SARS-CoV-2 viral copies this assay can detect is 138 copies/mL. A negative result does not preclude SARS-Cov-2 infection and should not be used as the sole basis for treatment or other patient management decisions. A negative result may occur with  improper specimen collection/handling, submission of specimen other than nasopharyngeal swab, presence of viral mutation(s) within the areas targeted by this assay, and inadequate number of viral copies(<138 copies/mL). A negative result must be combined with clinical observations, patient history, and epidemiological information. The expected result is Negative.  Fact Sheet for Patients:  EntrepreneurPulse.com.au  Fact Sheet for Healthcare Providers:  IncredibleEmployment.be  This test is no t yet approved or cleared by the [place] FDA and  has been authorized for detection and/or diagnosis of SARS-CoV-2 by FDA under an Emergency Use Authorization (EUA). This EUA will remain  in effect (meaning this test can be used) for the duration of the COVID-19 declaration under Section 564(b)(1) of the Act, 21 U.S.C.section 360bbb-3(b)(1), unless the authorization is terminated  or revoked sooner.       Influenza A by PCR NEGATIVE NEGATIVE Final   Influenza B by PCR NEGATIVE NEGATIVE Final    Comment: (NOTE) The Xpert Xpress SARS-CoV-2/FLU/RSV plus assay is intended as an aid in the diagnosis of influenza from Nasopharyngeal swab specimens and should not be used as a sole basis for treatment. Nasal washings and aspirates are  unacceptable for Xpert Xpress SARS-CoV-2/FLU/RSV testing.  Fact Sheet for Patients: EntrepreneurPulse.com.au  Fact Sheet for Healthcare Providers: IncredibleEmployment.be  This test is not yet approved or cleared by the [place] FDA and has been authorized for detection and/or diagnosis of SARS-CoV-2 by FDA under an Emergency Use Authorization (EUA). This EUA will remain in effect (meaning this test can be used) for the duration of the COVID-19 declaration under Section 564(b)(1) of the Act, 21 U.S.C. section 360bbb-3(b)(1), unless the authorization is terminated or revoked.  Performed at [facility], [address] [place], [place] [address]   MRSA Next Gen by PCR, Nasal     Status: None   Collection Time: 09/04/21 12:39 PM   Specimen: Nasal Mucosa; Nasal Swab  Result Value Ref Range Status   MRSA by PCR Next Gen NOT DETECTED NOT DETECTED Final    Comment: (NOTE) The GeneXpert MRSA Assay (FDA approved for NASAL specimens only), is one component of a comprehensive MRSA colonization surveillance program. It is not intended to diagnose MRSA infection nor to guide or monitor treatment for MRSA infections. Test performance is not FDA approved in patients less than 98 years old. Performed at [facility], [address], [place], [place] [address]      Time coordinating discharge: Over 30 minutes  SIGNED:   Nolberto Hanlon, MD  [facility] 09/10/2021, 1:32 PM Pager   If 7PM-7AM, please contact night-coverage [email or web address] Password [email or web address]

## 2021-09-10 NOTE — Progress Notes (Signed)
PT Cancellation Note  Patient Details Name: Nathan [name] MRN: [id number] DOB: [birth year]   Cancelled Treatment:    Reason Eval/Treat Not Completed: Patient declined, no reason specified. Pt supine in bed at arrival, declines therapy stating he may go home today. PT educated on importance of mobility to prevent hospital-acquired weakness. Pt states he has already become weak in the hospital. PT offered services again - pt continues to decline. Will attempt at later date.    Patrina Levering PT, DPT 09/10/21 1:06 PM [phone]

## 2021-10-06 ENCOUNTER — Encounter (HOSPITAL_COMMUNITY): Payer: Self-pay | Admitting: Radiology

## 2021-11-05 NOTE — Progress Notes (Signed)
Cardiology Office Note  Date:  11/06/2021   ID:  Nathan [name], DOB [birth year], MRN [id number]  PCP:  Nathan Late, MD   Chief Complaint  Patient presents with   PHQ-9 4 Week Follow-up    Patient c/o shortness of breath and chest pain at times. Medications reviewed by the patient verbally.     HPI:  Nathan [name] is a 61 y.o. male with past medical history of chronic combined systolic/diastolic heart failure,  cardiomyopathy (ischemic versus nonischemic),  paroxysmal atrial fib.,  left hydropneumothorax status post VATS October 2020,  insulin-dependent diabetes chronic kidney disease stage III,  MRSA bacteremia,  anemia of chronic disease,  hypertension,  smoker,  left ventricular ejection fraction is 45 to 50%.  September 2020 Hypokalemia Who presents for follow-up of his diastolic and systolic CHF, paroxysmal atrial fibrillation  LOV 11/21 He presents today in a wheelchair, Recent events/hospitalizations discussed with him  Seen in the hospital by cardiology June 2022 Acute on chronic diastolic and systolic CHF, medication noncompliance, had severe fluid retention and leg swelling Was treated with Lasix 80 twice daily with metolazone Discharged on Lasix for paroxysmal A-fib Cardiomyopathy with ejection fraction 40 to 45% in June 2022  hospitalization December 2022 ER with acute onset of bleeding from his left calf wound.,  Cellulitis S/p aortogram by vascular surgery no-focal stenosis creating flow limitation Hemoglobin 7.4 AKI on CKD  Seen by nephrology started on peritoneal dialysis For his atrial fibrillation he refused to start Eliquis,  EKG March 12, 2021 showing normal sinus rhythm EKG March 2022 normal sinus rhythm EKG November 2021 normal sinus rhythm  Lab work reviewed a1C 5.6 to 6.7  Does not make much urine on lasix Does PD, managed by nephrology Urine small amount 2x a day Has sores on left leg, 3 sores, dry blood down leg, Size of  quarter.  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  Rare orthostasis spells when having bowel movement Has to sit there on toilet to recover  EKG personally reviewed by myself on todays visit Shows normal sinus rhythm with rate 65 bpm,  no significant ST or T wave changes  PMH:   has a past medical history of Anemia of chronic disease, Chronic combined systolic (congestive) and diastolic (congestive) heart failure ([place]), CKD (chronic kidney disease), stage III ([place]), Diabetes mellitus without complication ([place]), Hypertension, Left Hydropneumothorax/Fibrinopurulent empyema, PAF (paroxysmal atrial fibrillation) ([place]), and Sepsis ([place]).  PSH:    Past Surgical History:  Procedure Laterality Date   CAPD INSERTION N/A 09/08/2021   Procedure: LAPAROSCOPIC INSERTION CONTINUOUS AMBULATORY PERITONEAL DIALYSIS  (CAPD) CATHETER;  Surgeon: Jules Husbands, MD;  Location: [facility];  Service: General;  Laterality: N/A;   HAND SURGERY Right    I & D EXTREMITY Right 06/11/2019   Procedure: IRRIGATION AND DEBRIDEMENT RIGHT THUMB;  Surgeon: Dereck Leep, MD;  Location: [facility];  Service: Orthopedics;  Laterality: Right;   LOWER EXTREMITY ANGIOGRAPHY Left 09/07/2021   Procedure: LOWER EXTREMITY ANGIOGRAPHY;  Surgeon: Algernon Huxley, MD;  Location: [facility];  Service: Cardiovascular;  Laterality: Left;   TEE WITHOUT CARDIOVERSION N/A 06/14/2019   Procedure: TRANSESOPHAGEAL ECHOCARDIOGRAM (TEE);  Surgeon: Minna Merritts, MD;  Location: [facility];  Service: Cardiovascular;  Laterality: N/A;   VIDEO ASSISTED THORACOSCOPY (VATS)/THOROCOTOMY Left 06/26/2019   Procedure: VIDEO ASSISTED THORACOSCOPY DECORTICATION;  Surgeon: Lajuana Matte, MD;  Location: [facility];  Service: Thoracic;  Laterality: Left;   VIDEO BRONCHOSCOPY N/A 06/26/2019   Procedure: VIDEO BRONCHOSCOPY;  Surgeon: Lajuana Matte, MD;  [facility];  Service:  Thoracic;  Laterality: N/A;    Current Outpatient Medications  Medication Sig Dispense Refill   acetaminophen (TYLENOL) 500 MG tablet Take 500-1,000 mg by mouth every 6 (six) hours as needed for mild pain or moderate pain.     aspirin EC 81 MG tablet Take 81 mg by mouth daily.     atorvastatin (LIPITOR) 40 MG tablet Take 1 tablet (40 mg total) by mouth at bedtime. 30 tablet 0   carvedilol (COREG) 25 MG tablet Take 1 tablet (25 mg total) by mouth 2 (two) times daily with a meal. 60 tablet 0   ferrous sulfate 325 (65 FE) MG tablet Take 1 tablet (325 mg total) by mouth daily with breakfast.     furosemide (LASIX) 80 MG tablet Take 1 tablet (80 mg total) by mouth daily. 30 tablet 0   glipiZIDE (GLUCOTROL XL) 2.5 MG 24 hr tablet Take 2.5 mg by mouth daily as needed (elevated blood glucose).     hydrALAZINE (APRESOLINE) 50 MG tablet Take 1 tablet (50 mg total) by mouth 3 (three) times daily. 90 tablet 0   isosorbide mononitrate (IMDUR) 30 MG 24 hr tablet Take 1 tablet (30 mg total) by mouth 2 (two) times daily. 180 tablet 3   Multiple Vitamin (MULTIVITAMIN) tablet Take 1 tablet by mouth daily.     No current facility-administered medications for this visit.     Allergies:   Patient has no known allergies.   Social History:  The patient  reports that he has never smoked. He has never used smokeless tobacco. He reports that he does not drink alcohol and does not use drugs.   Family History:   family history includes CAD in his father; Diabetes in his brother, sister, sister, and sister.    Review of Systems: Review of Systems  Constitutional:  Positive for weight loss.  HENT: Negative.    Respiratory: Negative.    Cardiovascular: Negative.   Gastrointestinal: Negative.   Musculoskeletal: Negative.   Neurological: Negative.   Psychiatric/Behavioral: Negative.    All other systems reviewed and are negative.  PHYSICAL EXAM: VS:  BP (!) 130/56 (BP Location: Left Arm, Patient Position:  Sitting, Cuff Size: Normal)    Pulse 65    Ht 5\' 9"  (1.753 m)    Wt 140 lb (63.5 kg)    SpO2 98%    BMI 20.67 kg/m  , BMI Body mass index is 20.67 kg/m. Constitutional:  oriented to person, place, and time. No distress.  HENT:  Head: Grossly normal Eyes:  no discharge. No scleral icterus.  Neck: No JVD, no carotid bruits  Cardiovascular: Regular rate and rhythm, no murmurs appreciated 1+ pitting lower extremity edema  Pulmonary/Chest: Clear to auscultation bilaterally, no wheezes or rails Abdominal: Soft.  no distension.  no tenderness.  Musculoskeletal: Normal range of motion Neurological:  normal muscle tone. Coordination normal. No atrophy Skin: Skin warm and dry, sores on left leg x3 size of quarter, dry blood down leg Psychiatric: normal affect, pleasant  Recent Labs: 03/12/2021: B Natriuretic Peptide 2,701.7 09/09/2021: ALT 14; Magnesium 2.5 09/10/2021: BUN 106; Creatinine, Ser 5.89; Hemoglobin 7.9; Platelets 332; Potassium 4.2; Sodium 142    Lipid Panel Lab Results  Component Value Date   CHOL 138 06/26/2019   HDL 39 (L) 06/26/2019   LDLCALC 75 06/26/2019   TRIG  119 06/26/2019    Wt Readings from Last 3 Encounters:  11/06/21 140 lb (63.5 kg)  09/03/21 145 lb (65.8 kg)  03/19/21 181 lb 4.8 oz (82.2 kg)      ASSESSMENT AND PLAN:  Problem List Items Addressed This Visit       Cardiology Problems   Essential hypertension   Relevant Orders   EKG 12-Lead   PAF (paroxysmal atrial fibrillation) (HCC)     Other   CKD (chronic kidney disease), stage III ([place])   Relevant Orders   EKG 12-Lead   Other Visit Diagnoses     Acute on chronic combined systolic and diastolic CHF (congestive heart failure) (HCC)    -  Primary   Relevant Orders   EKG 12-Lead   Cardiomyopathy, unspecified type ([place])       Coronary artery calcification seen on CT scan       Leg swelling         Acute on chronic systolic and diastolic CHF Fluid status managed by PD, lasix not doing  much, makes little urine 1+ leg edema, leg elevation, ace wraps  Leg wounds Recommend he watch these closely, keep them clean, for any worsening of his ulceration suggested he call the wound clinic  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  Essential hypertension Recommend he continue current regiment isosorbide 30 BID hydralazine 50 mg 3 times daily,  Coreg 25 mg BID For any hypotensive episodes during a bowel movement, recommend he increase his hydration, could cut back on his hydralazine as needed  ESRD /CKD On PD Followed by [facility]  Transaminitis Elevated GGT Repeat LFTs pending  Diabetes type 2 Hemoglobin A1c 6.9 Poor candidate for Metformin in the setting of renal dysfunction   Very complicated gentleman with multiorgan system failure  Total encounter time more than 40 minutes  Greater than 50% was spent in counseling and coordination of care with the patient    Signed, Esmond Plants, M.D., Ph.D. [place], [place]

## 2021-11-06 ENCOUNTER — Encounter: Payer: Self-pay | Admitting: Cardiovascular Disease

## 2021-11-06 ENCOUNTER — Ambulatory Visit (INDEPENDENT_AMBULATORY_CARE_PROVIDER_SITE_OTHER): Payer: Medicaid Other | Admitting: Cardiovascular Disease

## 2021-11-06 ENCOUNTER — Other Ambulatory Visit: Payer: Self-pay

## 2021-11-06 VITALS — BP 130/56 | HR 65 | Ht 69.0 in | Wt 140.0 lb

## 2021-11-06 DIAGNOSIS — N183 Chronic kidney disease, stage 3 unspecified: Secondary | ICD-10-CM

## 2021-11-06 DIAGNOSIS — I429 Cardiomyopathy, unspecified: Secondary | ICD-10-CM

## 2021-11-06 DIAGNOSIS — I480 Paroxysmal atrial fibrillation: Secondary | ICD-10-CM | POA: Diagnosis not present

## 2021-11-06 DIAGNOSIS — I5043 Acute on chronic combined systolic (congestive) and diastolic (congestive) heart failure: Secondary | ICD-10-CM | POA: Diagnosis not present

## 2021-11-06 DIAGNOSIS — I10 Essential (primary) hypertension: Secondary | ICD-10-CM | POA: Diagnosis not present

## 2021-11-06 DIAGNOSIS — I2510 Atherosclerotic heart disease of native coronary artery without angina pectoris: Secondary | ICD-10-CM

## 2021-11-06 DIAGNOSIS — M7989 Other specified soft tissue disorders: Secondary | ICD-10-CM

## 2021-11-06 MED ORDER — ISOSORBIDE MONONITRATE ER 30 MG PO TB24: 30.0000 mg | ORAL_TABLET | Freq: Two times a day (BID) | ORAL | 3 refills | Status: AC

## 2021-11-06 MED ORDER — FUROSEMIDE 80 MG PO TABS: 80.0000 mg | ORAL_TABLET | Freq: Every day | ORAL | 3 refills | Status: AC

## 2021-11-06 MED ORDER — ATORVASTATIN CALCIUM 40 MG PO TABS: 40.0000 mg | ORAL_TABLET | Freq: Every day | ORAL | 3 refills | Status: AC

## 2021-11-06 MED ORDER — CARVEDILOL 25 MG PO TABS: 25.0000 mg | ORAL_TABLET | Freq: Two times a day (BID) | ORAL | 3 refills | Status: AC

## 2021-11-06 MED ORDER — HYDRALAZINE HCL 50 MG PO TABS: 50.0000 mg | ORAL_TABLET | Freq: Three times a day (TID) | ORAL | 3 refills | Status: AC

## 2021-11-06 NOTE — Patient Instructions (Addendum)
Medication Instructions:  No changes  Refills sent in to [facility] for your cardiac medications  If you need a refill on your cardiac medications before your next appointment, please call your pharmacy.   Lab work: No new labs needed  Testing/Procedures: No new testing needed  Follow-Up: At [facility], you and your health needs are our priority.  As part of our continuing mission to provide you with exceptional heart care, we have created designated Provider Care Teams.  These Care Teams include your primary Cardiologist (physician) and Advanced Practice Providers (APPs -  Physician Assistants and Nurse Practitioners) who all work together to provide you with the care you need, when you need it.  You will need a follow up appointment in 6 months, APP ok  Providers on your designated Care Team:   Murray Hodgkins, NP Christell Faith, PA-C Cadence Kathlen Mody, [place]  COVID-19 Vaccine Information can be found at: [email or web address] For questions related to vaccine distribution or appointments, please email [email or web address] or call [phone].

## 2022-02-20 IMAGING — DX DG CHEST 1V
1 series · 1 of 1 positions shown · non-contrast
Comparison: Portable exam 2080 hours compared to 03/12/2021

CLINICAL DATA: Kidney damage, history diabetes mellitus,
hypertension, CHF, paroxysmal atrial fibrillation

EXAM:
CHEST  1 VIEW

[chest ap]
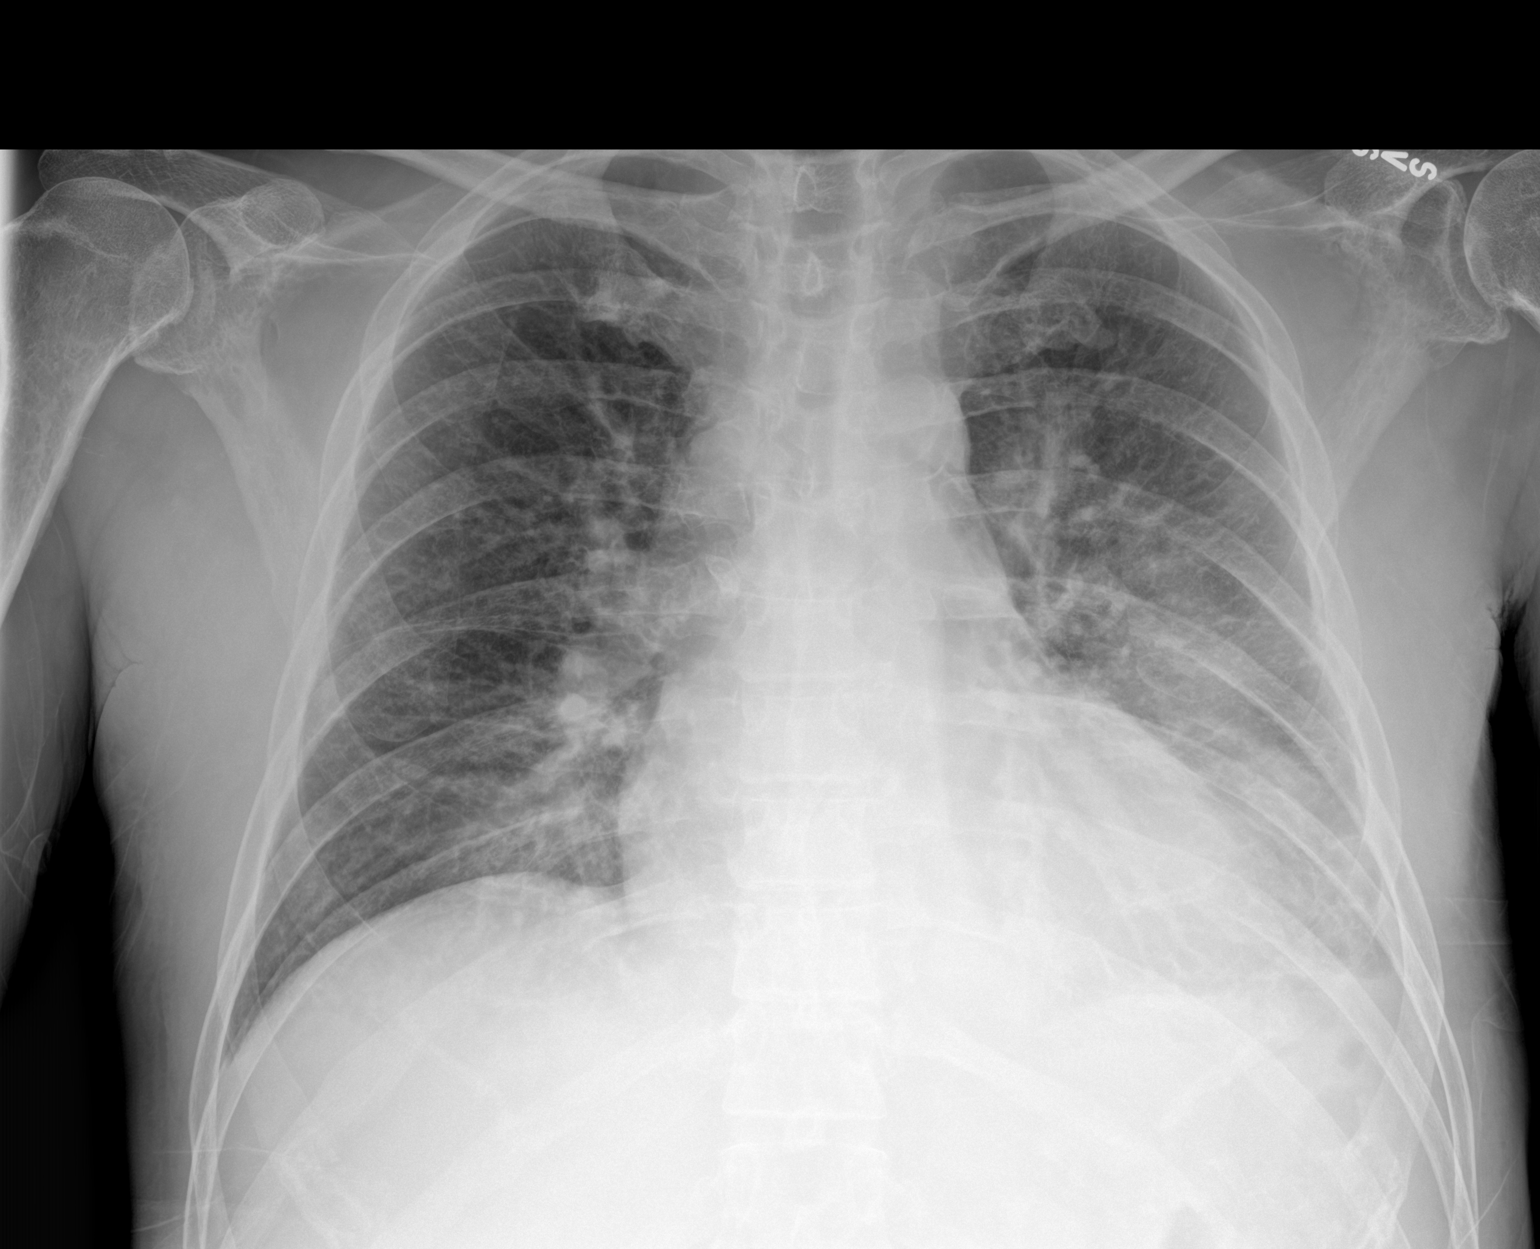

[1 of 1 positions shown; findings below may reference images not displayed]

FINDINGS: Enlargement of cardiac silhouette.

Mediastinal contours and pulmonary vascularity normal.

Atherosclerotic calcification at aortic arch.

Bronchitic changes with LEFT lower lobe consolidation consistent
with pneumonia.

Mild atelectasis versus infiltrate at medial RIGHT lung base as
well.

Tiny LEFT pleural effusion.

No pneumothorax or acute osseous findings.
IMPRESSION: LEFT lower lobe and question mild medial RIGHT lower lobe
infiltrates consistent pneumonia.

Tiny LEFT pleural effusion, decreased.

Enlargement of cardiac silhouette.

Aortic Atherosclerosis ([id number]-[id number].W).

## 2022-02-22 IMAGING — DX DG CHEST 1V PORT
1 series · 1 of 1 positions shown · non-contrast
Comparison: Portable exam 6261 hours compared to 09/07/2021

CLINICAL DATA: Abnormal chest radiograph, follow-up; history
diabetes mellitus, hypertension, CHF, atrial fibrillation

EXAM:
PORTABLE CHEST 1 VIEW

[chest ap]
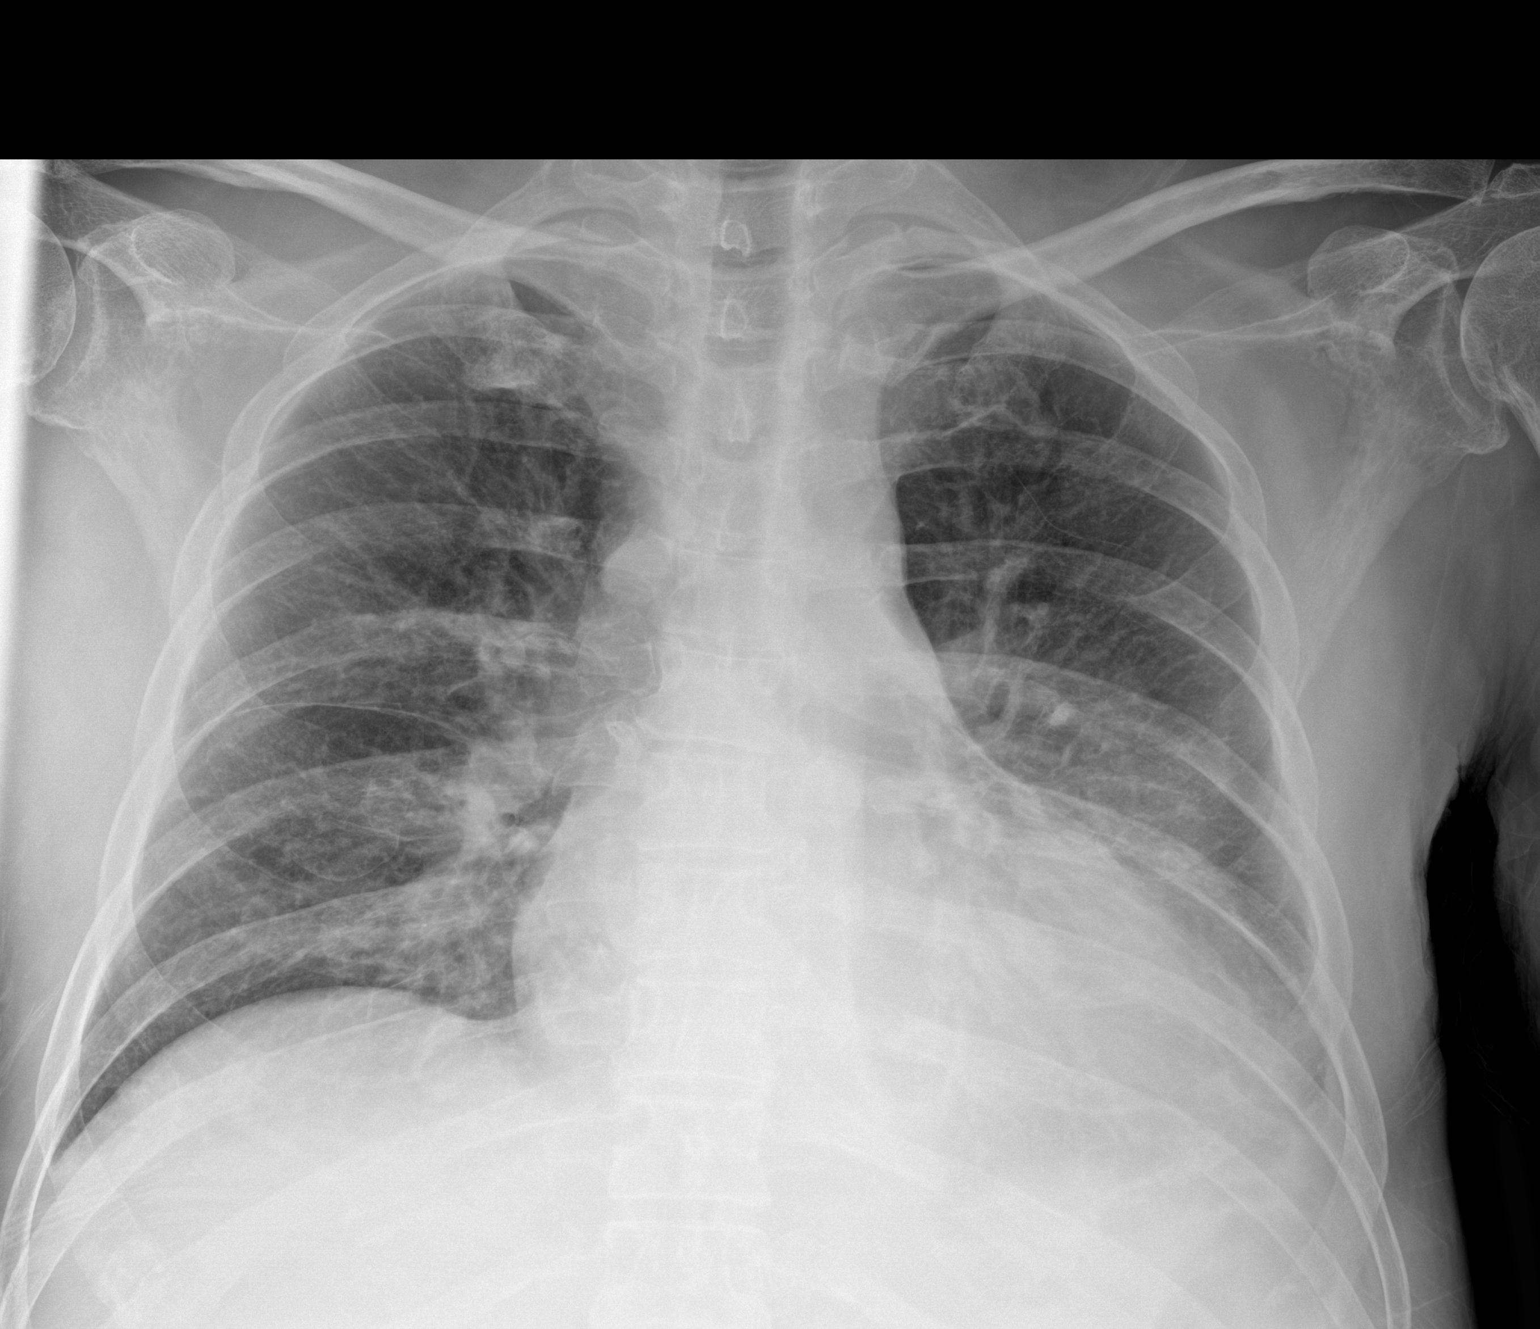

[1 of 1 positions shown; findings below may reference images not displayed]

FINDINGS: Enlargement of cardiac silhouette.

Mediastinal contours and pulmonary vascularity normal.

Persistent LEFT lower lobe consolidation.

Minimal RIGHT basilar atelectasis.

No pleural effusion or pneumothorax.

Osseous structures unremarkable.
IMPRESSION: Enlargement of cardiac silhouette with mild RIGHT basilar
atelectasis.

Persistent LEFT lower lobe consolidation consistent with pneumonia.

Follow-up radiographs recommended until resolution to exclude
underlying abnormalities.

## 2022-05-09 NOTE — Progress Notes (Deleted)
Cardiology Office Note  Date:  05/09/2022   ID:  Nathan [name], DOB 02/08/1961, MRN [id number]  PCP:  Derinda Late, MD   No chief complaint on file.   HPI:  Nathan [name] is a 61 y.o. male with past medical history of chronic combined systolic/diastolic heart failure,  cardiomyopathy (ischemic versus nonischemic),  paroxysmal atrial fib.,  left hydropneumothorax status post VATS October 2020,  insulin-dependent diabetes chronic kidney disease stage III,  MRSA bacteremia,  anemia of chronic disease,  hypertension,  smoker,  left ventricular ejection fraction is 45 to 50%.  September 2020 Hypokalemia Who presents for follow-up of his diastolic and systolic CHF, paroxysmal atrial fibrillation  LOV February 2023  He presents today in a wheelchair, Recent events/hospitalizations discussed with him  Seen in the hospital by cardiology June 2022 Acute on chronic diastolic and systolic CHF, medication noncompliance, had severe fluid retention and leg swelling Was treated with Lasix 80 twice daily with metolazone Discharged on Lasix for paroxysmal A-fib Cardiomyopathy with ejection fraction 40 to 45% in June 2022  hospitalization December 2022 ER with acute onset of bleeding from his left calf wound.,  Cellulitis S/p aortogram by vascular surgery no-focal stenosis creating flow limitation Hemoglobin 7.4 AKI on CKD  Seen by nephrology started on peritoneal dialysis For his atrial fibrillation he refused to start Eliquis,  EKG March 12, 2021 showing normal sinus rhythm EKG March 2022 normal sinus rhythm EKG November 2021 normal sinus rhythm  Lab work reviewed a1C 5.6 to 6.7  Does not make much urine on lasix Does PD, managed by nephrology Urine small amount 2x a day Has sores on left leg, 3 sores, dry blood down leg, Size of quarter.  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  Rare orthostasis  spells when having bowel movement Has to sit there on toilet to recover  EKG personally reviewed by myself on todays visit Shows normal sinus rhythm with rate 65 bpm,  no significant ST or T wave changes  PMH:   has a past medical history of Anemia of chronic disease, Chronic combined systolic (congestive) and diastolic (congestive) heart failure ([place]), CKD (chronic kidney disease), stage III ([place]), Diabetes mellitus without complication ([place]), Hypertension, Left Hydropneumothorax/Fibrinopurulent empyema, PAF (paroxysmal atrial fibrillation) ([place]), and Sepsis ([place]).  PSH:    Past Surgical History:  Procedure Laterality Date   CAPD INSERTION N/A 09/08/2021   Procedure: LAPAROSCOPIC INSERTION CONTINUOUS AMBULATORY PERITONEAL DIALYSIS  (CAPD) CATHETER;  Surgeon: Jules Husbands, MD;  Location: [facility];  Service: General;  Laterality: N/A;   HAND SURGERY Right    I & D EXTREMITY Right 06/11/2019   Procedure: IRRIGATION AND DEBRIDEMENT RIGHT THUMB;  Surgeon: Dereck Leep, MD;  Location: [facility];  Service: Orthopedics;  Laterality: Right;   LOWER EXTREMITY ANGIOGRAPHY Left 09/07/2021   Procedure: LOWER EXTREMITY ANGIOGRAPHY;  Surgeon: Algernon Huxley, MD;  Location: [facility];  Service: Cardiovascular;  Laterality: Left;   TEE WITHOUT CARDIOVERSION N/A 06/14/2019   Procedure: TRANSESOPHAGEAL ECHOCARDIOGRAM (TEE);  Surgeon: Minna Merritts, MD;  Location: [facility];  Service: Cardiovascular;  Laterality: N/A;   VIDEO ASSISTED THORACOSCOPY (VATS)/THOROCOTOMY Left 06/26/2019   Procedure: XFGHW ASSISTED THORACOSCOPY DECORTICATION;  Surgeon: Lajuana Matte, MD;  Location: [facility];  Service: Thoracic;  Laterality: Left;   VIDEO BRONCHOSCOPY N/A 06/26/2019   Procedure: VIDEO BRONCHOSCOPY;  Surgeon: Lajuana Matte, MD;  Location: [facility];  Service: Thoracic;  Laterality: N/A;    Current Outpatient Medications  Medication Sig Dispense Refill   acetaminophen (TYLENOL) 500 MG tablet Take  500-1,000 mg by mouth every 6 (six) hours as needed for mild pain or moderate pain.     aspirin EC 81 MG tablet Take 81 mg by mouth daily.     atorvastatin (LIPITOR) 40 MG tablet Take 1 tablet (40 mg total) by mouth at bedtime. 90 tablet 3   carvedilol (COREG) 25 MG tablet Take 1 tablet (25 mg total) by mouth 2 (two) times daily with a meal. 180 tablet 3   ferrous sulfate 325 (65 FE) MG tablet Take 1 tablet (325 mg total) by mouth daily with breakfast.     furosemide (LASIX) 80 MG tablet Take 1 tablet (80 mg total) by mouth daily. 90 tablet 3   glipiZIDE (GLUCOTROL XL) 2.5 MG 24 hr tablet Take 2.5 mg by mouth daily as needed (elevated blood glucose).     hydrALAZINE (APRESOLINE) 50 MG tablet Take 1 tablet (50 mg total) by mouth 3 (three) times daily. 270 tablet 3   isosorbide mononitrate (IMDUR) 30 MG 24 hr tablet Take 1 tablet (30 mg total) by mouth 2 (two) times daily. 180 tablet 3   Multiple Vitamin (MULTIVITAMIN) tablet Take 1 tablet by mouth daily.     No current facility-administered medications for this visit.     Allergies:   Patient has no known allergies.   Social History:  The patient  reports that he has never smoked. He has never used smokeless tobacco. He reports that he does not drink alcohol and does not use drugs.   Family History:   family history includes CAD in his father; Diabetes in his brother, sister, sister, and sister.    Review of Systems: Review of Systems  Constitutional:  Positive for weight loss.  HENT: Negative.    Respiratory: Negative.    Cardiovascular: Negative.   Gastrointestinal: Negative.   Musculoskeletal: Negative.   Neurological: Negative.   Psychiatric/Behavioral: Negative.    All other systems reviewed and are negative.   PHYSICAL EXAM: VS:  There were no vitals taken for this visit. , BMI There is no height or weight on file to calculate BMI. Constitutional:  oriented to person, place, and time. No distress.  HENT:  Head: Grossly  normal Eyes:  no discharge. No scleral icterus.  Neck: No JVD, no carotid bruits  Cardiovascular: Regular rate and rhythm, no murmurs appreciated 1+ pitting lower extremity edema  Pulmonary/Chest: Clear to auscultation bilaterally, no wheezes or rails Abdominal: Soft.  no distension.  no tenderness.  Musculoskeletal: Normal range of motion Neurological:  normal muscle tone. Coordination normal. No atrophy Skin: Skin warm and dry, sores on left leg x3 size of quarter, dry blood down leg Psychiatric: normal affect, pleasant  Recent Labs: 09/09/2021: ALT 14; Magnesium 2.5 09/10/2021: BUN 106; Creatinine, Ser 5.89; Hemoglobin 7.9; Platelets 332; Potassium 4.2; Sodium 142    Lipid Panel Lab Results  Component Value Date   CHOL 138 06/26/2019   HDL 39 (L) 06/26/2019   LDLCALC 75 06/26/2019   TRIG 119 06/26/2019    Wt Readings from Last 3 Encounters:  11/06/21 140 lb (63.5 kg)  09/03/21 145 lb (65.8 kg)  03/19/21 181 lb 4.8 oz (82.2 kg)      ASSESSMENT AND PLAN:  Problem List Items Addressed This Visit   None Acute on chronic systolic and diastolic CHF Fluid status managed by PD, lasix not doing  much, makes little urine 1+ leg edema, leg elevation, ace wraps  Leg wounds Recommend he watch these closely, keep them clean, for any worsening of his ulceration suggested he call the wound clinic  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  Essential hypertension Recommend he continue current regiment isosorbide 30 BID hydralazine 50 mg 3 times daily,  Coreg 25 mg BID For any hypotensive episodes during a bowel movement, recommend he increase his hydration, could cut back on his hydralazine as needed  ESRD /CKD On PD Followed by [facility]  Transaminitis Elevated GGT Repeat LFTs pending  Diabetes type 2 Hemoglobin A1c 6.9 Poor candidate for Metformin in the setting of renal dysfunction   Very complicated gentleman with  multiorgan system failure  Total encounter time more than 40 minutes  Greater than 50% was spent in counseling and coordination of care with the patient    Signed, Esmond Plants, M.D., Ph.D. [place], [place]

## 2022-05-10 ENCOUNTER — Ambulatory Visit: Payer: Medicare Other | Admitting: Cardiovascular Disease

## 2022-05-10 ENCOUNTER — Encounter: Payer: Self-pay | Admitting: Cardiovascular Disease

## 2022-05-10 DIAGNOSIS — I10 Essential (primary) hypertension: Secondary | ICD-10-CM

## 2022-05-10 DIAGNOSIS — R079 Chest pain, unspecified: Secondary | ICD-10-CM

## 2022-05-10 DIAGNOSIS — M7989 Other specified soft tissue disorders: Secondary | ICD-10-CM

## 2022-05-10 DIAGNOSIS — I2510 Atherosclerotic heart disease of native coronary artery without angina pectoris: Secondary | ICD-10-CM

## 2022-05-10 DIAGNOSIS — I5043 Acute on chronic combined systolic (congestive) and diastolic (congestive) heart failure: Secondary | ICD-10-CM

## 2022-05-10 DIAGNOSIS — N183 Chronic kidney disease, stage 3 unspecified: Secondary | ICD-10-CM

## 2022-05-10 DIAGNOSIS — I429 Cardiomyopathy, unspecified: Secondary | ICD-10-CM

## 2022-05-10 DIAGNOSIS — I480 Paroxysmal atrial fibrillation: Secondary | ICD-10-CM

## 2022-07-13 ENCOUNTER — Telehealth: Payer: Self-pay | Admitting: Cardiovascular Disease

## 2022-07-13 NOTE — Telephone Encounter (Signed)
Will forward to Dr Rockey Situ

## 2022-07-13 NOTE — Telephone Encounter (Signed)
Caller reports that patient is deceased and they have placed this information in the electronic system ([place] [facility]).  Case# [id number].  Caller stated Dr. Rockey Situ will need to sign off and requests a call back when completed.

## 2022-07-21 DEATH — deceased
